# Patient Record
Sex: Male | Born: 1960 | Race: White | Hispanic: No | Marital: Single | Smoking: Current some day smoker
Health system: Southern US, Community
[De-identification: ages and names within clinical notes are randomized; demographics above are authoritative.]

## PROBLEM LIST (undated history)

## (undated) DIAGNOSIS — K922 Gastrointestinal hemorrhage, unspecified: Secondary | ICD-10-CM

## (undated) DIAGNOSIS — E119 Type 2 diabetes mellitus without complications: Secondary | ICD-10-CM

## (undated) DIAGNOSIS — C801 Malignant (primary) neoplasm, unspecified: Secondary | ICD-10-CM

## (undated) DIAGNOSIS — A159 Respiratory tuberculosis unspecified: Secondary | ICD-10-CM

## (undated) DIAGNOSIS — K7460 Unspecified cirrhosis of liver: Secondary | ICD-10-CM

## (undated) DIAGNOSIS — D689 Coagulation defect, unspecified: Secondary | ICD-10-CM

## (undated) DIAGNOSIS — F17200 Nicotine dependence, unspecified, uncomplicated: Secondary | ICD-10-CM

## (undated) DIAGNOSIS — F1010 Alcohol abuse, uncomplicated: Secondary | ICD-10-CM

## (undated) DIAGNOSIS — K7290 Hepatic failure, unspecified without coma: Secondary | ICD-10-CM

## (undated) DIAGNOSIS — B1920 Unspecified viral hepatitis C without hepatic coma: Secondary | ICD-10-CM

## (undated) DIAGNOSIS — I8500 Esophageal varices without bleeding: Secondary | ICD-10-CM

## (undated) DIAGNOSIS — K7682 Hepatic encephalopathy: Secondary | ICD-10-CM

## (undated) DIAGNOSIS — D696 Thrombocytopenia, unspecified: Secondary | ICD-10-CM

## (undated) DIAGNOSIS — F209 Schizophrenia, unspecified: Secondary | ICD-10-CM

## (undated) HISTORY — DX: Nicotine dependence, unspecified, uncomplicated: F17.200

## (undated) HISTORY — PX: OTHER SURGICAL HISTORY: SHX169

## (undated) HISTORY — DX: Unspecified viral hepatitis C without hepatic coma: B19.20

## (undated) HISTORY — DX: Type 2 diabetes mellitus without complications: E11.9

## (undated) HISTORY — DX: Coagulation defect, unspecified: D68.9

## (undated) HISTORY — DX: Alcohol abuse, uncomplicated: F10.10

---

## 1998-02-16 ENCOUNTER — Encounter: Admission: RE | Admit: 1998-02-16 | Discharge: 1998-02-16 | Payer: Self-pay | Admitting: Family Medicine

## 2002-08-19 ENCOUNTER — Inpatient Hospital Stay (HOSPITAL_COMMUNITY): Admission: EM | Admit: 2002-08-19 | Discharge: 2002-09-02 | Payer: Self-pay | Admitting: Psychiatry

## 2004-03-12 ENCOUNTER — Encounter: Admission: RE | Admit: 2004-03-12 | Discharge: 2004-03-12 | Payer: Self-pay | Admitting: Orthopedic Surgery

## 2004-10-19 ENCOUNTER — Ambulatory Visit: Payer: Self-pay | Admitting: Gastroenterology

## 2005-01-17 ENCOUNTER — Ambulatory Visit: Payer: Self-pay | Admitting: Internal Medicine

## 2005-04-05 ENCOUNTER — Ambulatory Visit: Payer: Self-pay | Admitting: Internal Medicine

## 2006-02-10 ENCOUNTER — Ambulatory Visit: Payer: Self-pay | Admitting: Gastroenterology

## 2007-02-06 ENCOUNTER — Ambulatory Visit: Payer: Self-pay | Admitting: Gastroenterology

## 2008-12-01 ENCOUNTER — Emergency Department (HOSPITAL_COMMUNITY): Admission: EM | Admit: 2008-12-01 | Discharge: 2008-12-01 | Payer: Self-pay | Admitting: Emergency Medicine

## 2011-03-18 NOTE — Discharge Summary (Signed)
NAME:  Oscar Swanson, Oscar Swanson                        ACCOUNT NO.:  [id number]   MEDICAL RECORD NO.:  [id number]                   PATIENT TYPE:  IPS   LOCATION:  0401                                 FACILITY:  [facility]   PHYSICIAN:  Geoffery Lyons, M.D.                   DATE OF BIRTH:  [birth year]   DATE OF ADMISSION:  08/19/2002  DATE OF DISCHARGE:  09/02/2002                                 DISCHARGE SUMMARY   CHIEF COMPLAINT AND PRESENT ILLNESS:  This was the first or second admission  to [facility] for this 50 year old single white  male involuntarily committed.  He reported that involuntary commitment said  that he had been noncompliant with medications.  He was paranoid,  threatening his landlord, wanting to blow up his apartment complex,  delusional, believed that he owned several businesses and they were trying  to ruin them.  He was not sleeping well.  He was not eating well.  He  reported positive auditory hallucinations but he would not elaborate.  He  also admitted that he had some alcohol use.   PAST PSYCHIATRIC HISTORY:  He was a very poor historian.  He has had follow  up through the mental health center.  This might be the first time at  [facility] but it was not clear if he had been admitted to  other institutions.   SUBSTANCE ABUSE HISTORY:  He claimed he drinks some and does some drug use  but would not elaborate. Denied the use or abuse of any substances.   PAST MEDICAL HISTORY:  Hepatitis C.   MEDICATIONS:  1. Seroquel.  2. Vistaril.  3. Tegretol.   None presently.   PHYSICAL EXAMINATION:  Physical examination was performed, failed to show  any acute findings.   MENTAL STATUS EXAM:  Mental status exam revealed an alert, passively  cooperative, disheveled, suspicious, guarded male wearing sunglasses.  Speech was clear.  Mood: He claimed he was fine.  His affect was blunted.  Thought processes: Evidence of some delusional  ideas, suspiciousness,  paranoia, grandiosity, some positive auditory hallucinations, no visual  hallucinations, denied any suicidal or homicidal ideas.  Cognitive:  Cognition was affected by the primary psychotic process.   ADMISSION DIAGNOSES:   AXIS I:  Schizophrenia, paranoid type.   AXIS II:  No diagnosis.   AXIS III:  Hepatitis C.   AXIS IV:  Moderate.   AXIS V:  Global assessment of functioning upon admission 20-25, highest  global assessment of functioning in the last year 50-55.   LABORATORY DATA:  Initial laboratory workup: Liver enzymes: SGOT 62, SGPT  113.  TSH 6.37.  Urine drug screening was negative for substances of abuse.  Hepatitis panel: Hepatitis C antibody was positive.   HOSPITAL COURSE:  He was admitted and started in intensive individual and  group psychotherapy.  He was given Ambien as needed  for sleep, Vistaril as  needed for anxiety, also Ativan.  He was given Risperdal 0.25 mg in the  morning and in the afternoon and Risperdal 0.5 mg at bedtime.  Risperdal was  increased to 0.5 mg twice a day and 1 mg at night.  He was given some  Librium as needed.  He continued to evidence active delusional ideas so the  Risperdal continued to be increased up to 1 mg twice a day and 3 mg at  bedtime.  He was very suspicious of staff, minimized his symptoms, could not  explain what happened, claimed he did not have a place to go to stay.  He  felt that he needed to press charges with other people who were trying take  advantage of him and keep his possessions.  We started working on his  reality testing as well as encouraging his compliance with treatment with  medications once discharged.  He continued to minimize and was very guarded.  He did endorse that he could read minds, that was why he was avoiding  people.  He also felt that the social worker could read his mind so he was  very uncomfortable with her.  We tried discharge but the delusional ideas  were so  overwhelming that there was concern about his safety, so discharge  was cancelled.  We continued to optimize treatment with medications.  He was  tolerating medications well.  By November 3, he was reportedly at baseline,  in much improved, compliant with medications, no active symptomatology, no  active delusional ideas, no evidence of active hallucinations.  Mental  health was going to be working closely with him to ensure that he would  comply with outpatient treatment.  So, as there were no suicidal ideas, no  homicidal ideas, he was much improved, we went ahead and discharged to  outpatient followup.   DISCHARGE DIAGNOSES:   AXIS I:  Schizophrenia, chronic, paranoid type.   AXIS II:  No diagnosis.   AXIS III:  Hepatitis C.   AXIS IV:  Moderate.   AXIS V:  Global assessment of functioning upon discharge 50.   DISCHARGE MEDICATIONS:  1. Ativan 0.5 mg four times a day.  2. Risperdal 1 mg twice a day and 4 mg at bedtime.  3. Hydrochlorothiazide 25 mg daily.  4. Flexeril 10 mg three a day for a week.   FOLLOW UP:  He was to follow up at [facility].                                                Geoffery Lyons, M.D.   IL/MEDQ  D:  10/01/2002  T:  10/01/2002  Job:  [id number]

## 2011-03-18 NOTE — H&P (Signed)
NAME:  Oscar Swanson, Oscar Swanson                        ACCOUNT NO.:  [id number]   MEDICAL RECORD NO.:  [id number]                   PATIENT TYPE:  IPS   LOCATION:  0401                                 FACILITY:  [facility]   PHYSICIAN:  Geoffery Lyons, M.D.                   DATE OF BIRTH:  [birth year]   DATE OF ADMISSION:  08/19/2002  DATE OF DISCHARGE:                         PSYCHIATRIC ADMISSION ASSESSMENT   IDENTIFYING INFORMATION:  A 50 year old single white male, involuntarily  committed on August 19, 2002.   HISTORY OF PRESENT ILLNESS:  The patient presents with a history of  commitment.  Papers report that the patient is noncompliant with  medications.  Papers state that the patient is paranoid, threatening his  landlord and wanting to blow up his apartment complex.  He is delusional.  He believes he owns several businesses and they are trying to run them.  Other information is uncertain if the patient has been sleeping or eating  well.  The patient reports positive auditory hallucinations but does not  want to elaborate on what they are about.  He reports he has had some  alcohol and drug use.   PAST PSYCHIATRIC HISTORY:  Uncertain inpatient admissions.  This appears to  be his first admission at [facility].   SOCIAL HISTORY:  He is a 50 year old single white male, no children.  He  lives alone.  He is unemployed.   FAMILY HISTORY:  Unclear.   ALCOHOL DRUG HISTORY:  The patient states he drinks some and does some drug  use.   PAST MEDICAL HISTORY:  Primary care Aniqa Hare is unknown.  Medical problems  are hepatitis C.   MEDICATIONS:  The patient has been on Seroquel, Vistaril and Tegretol in the  past.   DRUG ALLERGIES:  HALDOL.   PHYSICAL EXAMINATION:  VITAL SIGNS:  He is 5 feet 7 inches tall, he is 221  pounds, 97, 71, 18, blood pressure is 118/83.  GENERAL APPEARANCE:  The patient  is a 50 year old white male in no acute  distress.  He is well-developed,  average stature.  Appears his stated age.  He is unkempt with some body odor present.  HEAD:  Normocephalic, atraumatic.  Can raise his eyebrows.  EOMs are intact  bilaterally.  External ear canals are patent.  Hearing is appropriate to  conversation.  No sinus tenderness.  Mucosa is moise with poor dentition.  No lesions were seen.  Tongue protrudes midline without tremor.  Can clench  his teeth and puff out his cheeks.  Uvula is midline.  Pharynx is within  normal limits.  NECK:  Supple, no JVD, negative lymphadenopathy.  Trachea is midline.  No  cervical lymphadenopathy.  CHEST:  Clear to auscultation.  No adventitious sounds.  Thorax is  symmetrical with good expansion.  HEART:  Regular rate and rhythm, without murmurs, gallops or rubs.  Carotid  pulses are equal and  adequate.  No edema was noted.  ABDOMEN:  Obese, protuberant, nontender abdomen.  Active bowel sounds are  present.  No CVA tenderness.  MUSCULOSKELETAL:  No joint swelling or deformity.  Muscle strength and tone  is equal bilaterally.  SKIN:  Warm and dry with good turgor.  Nail beds are pink with good  capillary refill.  No rashes or lacerations were noted.  NEUROLOGIC:  Oriented x3.  Cranial nerves are grossly intact.  Cerebellar  function intact.   LABORATORY DATA:  CBC:  Hematocrit is 17.3.  CMET:  Liver enzymes:  SGOT is  elevated at 62, SGPT is 113.  Total protein is 8.5.  TSH is 6.37.  Urine  drug screen is negative.  Hepatitis panel:  Hepatitis C antibody is  positive.   MENTAL STATUS EXAM:  He is alert, he is passively cooperative, he is  disheveled, suspicious, guarded, wearing sunglasses.  Speech is clear.  Mood:  The patient states he is fine.  His affect is blunted.  Thought  processes are paranoid, delusional, and positive auditory hallucinations, no  visual hallucinations, no suicidal or homicidal ideation.  Cognitive:  The  patient is unsure of the date.  His memory is fair, judgment and insight  are  poor.   ADMISSION DIAGNOSES:   AXIS I:  Paranoid schizophrenia.   AXIS II:  Deferred.   AXIS III:  Hepatitis C.   AXIS IV:  Problems with primary support group, lack of support, problems  related to social environment, other psychosocial problems.   AXIS V:  Current is 25, this past year 55-60.   PLAN:  Involuntary commitment to [facility] for paranoid  behaviors, threatening behavior.  Contract for safety, check every 15  minutes.  We will put the patient on 400 hall for close monitoring.  We will  check his labs.  Will initiate Risperdal for psychosis and Ativan for  anxiety.  We will encourage group activities.  Case worker will work on  placement issues for the patient.  Patient to be medication compliant, to  follow up with mental health.   TENTATIVE LENGTH OF CARE:  4-5 days or more depending on response to  medication and placement issues.       Landry Corporal, N.P.                       Geoffery Lyons, M.D.    JO/MEDQ  D:  08/21/2002  T:  08/21/2002  Job:  [id number]

## 2011-08-24 ENCOUNTER — Ambulatory Visit (INDEPENDENT_AMBULATORY_CARE_PROVIDER_SITE_OTHER): Payer: Medicaid Other

## 2011-08-24 DIAGNOSIS — L989 Disorder of the skin and subcutaneous tissue, unspecified: Secondary | ICD-10-CM

## 2013-09-23 ENCOUNTER — Ambulatory Visit: Payer: Medicaid Other | Attending: Internal Medicine | Admitting: Internal Medicine

## 2013-09-23 ENCOUNTER — Encounter: Payer: Self-pay | Admitting: Internal Medicine

## 2013-09-23 VITALS — BP 131/89 | HR 72 | Temp 98.0°F | Resp 16 | Ht 69.0 in | Wt 202.4 lb

## 2013-09-23 DIAGNOSIS — IMO0001 Reserved for inherently not codable concepts without codable children: Secondary | ICD-10-CM

## 2013-09-23 DIAGNOSIS — E119 Type 2 diabetes mellitus without complications: Secondary | ICD-10-CM | POA: Insufficient documentation

## 2013-09-23 DIAGNOSIS — F17200 Nicotine dependence, unspecified, uncomplicated: Secondary | ICD-10-CM | POA: Insufficient documentation

## 2013-09-23 DIAGNOSIS — Z23 Encounter for immunization: Secondary | ICD-10-CM

## 2013-09-23 DIAGNOSIS — Z139 Encounter for screening, unspecified: Secondary | ICD-10-CM

## 2013-09-23 DIAGNOSIS — M549 Dorsalgia, unspecified: Secondary | ICD-10-CM | POA: Insufficient documentation

## 2013-09-23 HISTORY — DX: Reserved for inherently not codable concepts without codable children: IMO0001

## 2013-09-23 HISTORY — DX: Nicotine dependence, unspecified, uncomplicated: F17.200

## 2013-09-23 MED ORDER — NAPROXEN 500 MG PO TABS: 500.0000 mg | ORAL_TABLET | Freq: Two times a day (BID) | ORAL | Status: DC

## 2013-09-23 MED ORDER — NICOTINE 21 MG/24HR TD PT24: 21.0000 mg | MEDICATED_PATCH | Freq: Every day | TRANSDERMAL | Status: DC

## 2013-09-23 NOTE — Progress Notes (Signed)
Patient ID: CHEN SAADEH, male   DOB: 09-18-1961, 52 y.o.   MRN: [id number] MRN: [id number] Name: Oscar Swanson  Sex: male Age: 52 y.o. DOB: [birth year]  Allergies: Review of patient's allergies indicates no known allergies.  Chief Complaint  Patient presents with   Back Pain   Diabetes    HPI: Patient is 52 y.o. male who   52 year old male comes with a stent establish medical care, as per patient several years ago he was diagnosed with diabetes and was taking glipizide, for the last one year he has not been taking any medication, patient also reported to have some back pain which is chronic, requesting something medication, denies any shooting pain down to the legs or any incontinence, patient also smokes cigarettes everyday advised to quit smoking, is agreeable to try nicotine patch, as per patient he used to see ophthalmologist, he is advised to make a followup appointment with his eye doctor. Patient denies any headache dizziness chest shortness of breath.  Past Medical History  Diagnosis Date   Diabetes mellitus without complication    Diabetes     Past Surgical History  Procedure Laterality Date   Cataract surgery         Medication List       This list is accurate as of: 09/23/13  3:25 PM.  Always use your most recent med list.               GLIPIZIDE PO  Take by mouth.     naproxen 500 MG tablet  Commonly known as:  NAPROSYN  Take 1 tablet (500 mg total) by mouth 2 (two) times daily with a meal.     nicotine 21 mg/24hr patch  Commonly known as:  NICODERM CQ  Place 1 patch (21 mg total) onto the skin daily.        Meds ordered this encounter  Medications   GLIPIZIDE PO    Sig: Take by mouth.   naproxen (NAPROSYN) 500 MG tablet    Sig: Take 1 tablet (500 mg total) by mouth 2 (two) times daily with a meal.    Dispense:  30 tablet    Refill:  2   nicotine (NICODERM CQ) 21 mg/24hr patch    Sig: Place 1 patch (21 mg total) onto the skin  daily.    Dispense:  28 patch    Refill:  0    Immunization History  Administered Date(s) Administered   Influenza,inj,Quad PF,36+ Mos 09/23/2013    History  Substance Use Topics   Smoking status: Heavy Tobacco Smoker -- 0.50 packs/day for 25 years    Types: Cigarettes   Smokeless tobacco: Not on file   Alcohol Use: Not on file     Comment:  drink beer socially     Review of Systems  As noted in HPI  Filed Vitals:   09/23/13 1423  BP: 131/89  Pulse: 72  Temp: 98 F (36.7 C)  Resp: 16    Physical Exam  Physical Exam  Constitutional: No distress.  Eyes: EOM are normal. Pupils are equal, round, and reactive to light.  Cardiovascular: Normal rate and regular rhythm.   Pulmonary/Chest: Breath sounds normal. No respiratory distress. He has no wheezes. He has no rales.  Neurological:  Minimal lower lumbar spinal tenderness, SLR negative, equal strength both lower extremities    CBC No results found for this basename: wbc, rbc, hgb, hct, plt, mcv, neutrabs, lymphsabs, monoabs, eosabs, basosabs    CMP  No results found for this basename: na, k, cl, co2, glucose, bun, creatinine, calcium, prot, albumin, ast, alt, alkphos, bilitot, gfrnonaa, gfraa    No results found for this basename: chol, tri, ldl    No components found with this basename: hga1c    No results found for this basename: AST    Assessment and Plan   Diabetes - patient has history of diabetes.   Plan: Glucose (CBG) today 146, HgB A1c 4.5% patient has not been on any oral hypoglycemics for the last one year, will recheck hemoglobin A1c in 3 months  Smoking - Plan: nicotine (NICODERM CQ) 21 mg/24hr patch  Back pain - Plan: naproxen (NAPROSYN) 500 MG tablet  Need for prophylactic vaccination and inoculation against influenza  Screening - Plan: CBC with Differential, COMPLETE METABOLIC PANEL WITH GFR, TSH, Lipid panel, Vit D  25 hydroxy (rtn osteoporosis monitoring), Ambulatory referral to  Gastroenterology   Health Maintenance -Colonoscopy: Referral done   Return in about 4 weeks (around 10/21/2013).  Doris Cheadle, MD

## 2013-09-23 NOTE — Progress Notes (Signed)
Patient here to establish care History of DM Lower back pain- would like pain medication

## 2013-09-24 ENCOUNTER — Ambulatory Visit: Payer: Medicaid Other | Admitting: Internal Medicine

## 2013-10-29 ENCOUNTER — Ambulatory Visit: Payer: Medicaid Other

## 2013-11-07 ENCOUNTER — Emergency Department (HOSPITAL_COMMUNITY): Payer: Medicaid Other

## 2013-11-07 ENCOUNTER — Inpatient Hospital Stay (HOSPITAL_COMMUNITY)
Admission: EM | Admit: 2013-11-07 | Discharge: 2013-11-15 | DRG: 432 | Discharge status: Against Medical Advice | Payer: Medicaid Other | Attending: Internal Medicine | Admitting: Internal Medicine

## 2013-11-07 ENCOUNTER — Encounter (HOSPITAL_COMMUNITY): Payer: Self-pay | Admitting: Emergency Medicine

## 2013-11-07 DIAGNOSIS — E119 Type 2 diabetes mellitus without complications: Secondary | ICD-10-CM | POA: Diagnosis present

## 2013-11-07 DIAGNOSIS — R000 Tachycardia, unspecified: Secondary | ICD-10-CM | POA: Diagnosis present

## 2013-11-07 DIAGNOSIS — IMO0001 Reserved for inherently not codable concepts without codable children: Secondary | ICD-10-CM | POA: Diagnosis present

## 2013-11-07 DIAGNOSIS — I8510 Secondary esophageal varices without bleeding: Secondary | ICD-10-CM | POA: Diagnosis present

## 2013-11-07 DIAGNOSIS — K7010 Alcoholic hepatitis without ascites: Secondary | ICD-10-CM | POA: Diagnosis present

## 2013-11-07 DIAGNOSIS — I498 Other specified cardiac arrhythmias: Secondary | ICD-10-CM | POA: Diagnosis present

## 2013-11-07 DIAGNOSIS — F209 Schizophrenia, unspecified: Secondary | ICD-10-CM | POA: Diagnosis present

## 2013-11-07 DIAGNOSIS — K922 Gastrointestinal hemorrhage, unspecified: Secondary | ICD-10-CM | POA: Diagnosis present

## 2013-11-07 DIAGNOSIS — D62 Acute posthemorrhagic anemia: Secondary | ICD-10-CM | POA: Diagnosis present

## 2013-11-07 DIAGNOSIS — D696 Thrombocytopenia, unspecified: Secondary | ICD-10-CM | POA: Diagnosis present

## 2013-11-07 DIAGNOSIS — IMO0002 Reserved for concepts with insufficient information to code with codable children: Secondary | ICD-10-CM | POA: Diagnosis present

## 2013-11-07 DIAGNOSIS — D689 Coagulation defect, unspecified: Secondary | ICD-10-CM | POA: Diagnosis present

## 2013-11-07 DIAGNOSIS — B1921 Unspecified viral hepatitis C with hepatic coma: Secondary | ICD-10-CM | POA: Diagnosis present

## 2013-11-07 DIAGNOSIS — E876 Hypokalemia: Secondary | ICD-10-CM | POA: Diagnosis present

## 2013-11-07 DIAGNOSIS — D61818 Other pancytopenia: Secondary | ICD-10-CM | POA: Diagnosis present

## 2013-11-07 DIAGNOSIS — F1021 Alcohol dependence, in remission: Secondary | ICD-10-CM

## 2013-11-07 DIAGNOSIS — F200 Paranoid schizophrenia: Secondary | ICD-10-CM | POA: Diagnosis present

## 2013-11-07 DIAGNOSIS — Z79899 Other long term (current) drug therapy: Secondary | ICD-10-CM

## 2013-11-07 DIAGNOSIS — F1020 Alcohol dependence, uncomplicated: Secondary | ICD-10-CM | POA: Diagnosis present

## 2013-11-07 DIAGNOSIS — K766 Portal hypertension: Secondary | ICD-10-CM | POA: Diagnosis present

## 2013-11-07 DIAGNOSIS — E870 Hyperosmolality and hypernatremia: Secondary | ICD-10-CM | POA: Diagnosis not present

## 2013-11-07 DIAGNOSIS — R188 Other ascites: Secondary | ICD-10-CM | POA: Diagnosis present

## 2013-11-07 DIAGNOSIS — K254 Chronic or unspecified gastric ulcer with hemorrhage: Secondary | ICD-10-CM | POA: Diagnosis present

## 2013-11-07 DIAGNOSIS — E860 Dehydration: Secondary | ICD-10-CM | POA: Diagnosis present

## 2013-11-07 DIAGNOSIS — F1410 Cocaine abuse, uncomplicated: Secondary | ICD-10-CM | POA: Diagnosis present

## 2013-11-07 DIAGNOSIS — R578 Other shock: Secondary | ICD-10-CM | POA: Diagnosis present

## 2013-11-07 DIAGNOSIS — Z23 Encounter for immunization: Secondary | ICD-10-CM

## 2013-11-07 DIAGNOSIS — K7030 Alcoholic cirrhosis of liver without ascites: Principal | ICD-10-CM | POA: Diagnosis present

## 2013-11-07 DIAGNOSIS — Z8611 Personal history of tuberculosis: Secondary | ICD-10-CM

## 2013-11-07 DIAGNOSIS — K319 Disease of stomach and duodenum, unspecified: Secondary | ICD-10-CM | POA: Diagnosis present

## 2013-11-07 DIAGNOSIS — J9600 Acute respiratory failure, unspecified whether with hypoxia or hypercapnia: Secondary | ICD-10-CM | POA: Diagnosis not present

## 2013-11-07 DIAGNOSIS — K7290 Hepatic failure, unspecified without coma: Secondary | ICD-10-CM

## 2013-11-07 DIAGNOSIS — K7682 Hepatic encephalopathy: Secondary | ICD-10-CM | POA: Diagnosis present

## 2013-11-07 DIAGNOSIS — D72829 Elevated white blood cell count, unspecified: Secondary | ICD-10-CM | POA: Diagnosis present

## 2013-11-07 DIAGNOSIS — I8501 Esophageal varices with bleeding: Secondary | ICD-10-CM

## 2013-11-07 DIAGNOSIS — I8511 Secondary esophageal varices with bleeding: Secondary | ICD-10-CM | POA: Diagnosis present

## 2013-11-07 DIAGNOSIS — F17200 Nicotine dependence, unspecified, uncomplicated: Secondary | ICD-10-CM | POA: Diagnosis present

## 2013-11-07 DIAGNOSIS — Z87898 Personal history of other specified conditions: Secondary | ICD-10-CM

## 2013-11-07 DIAGNOSIS — J690 Pneumonitis due to inhalation of food and vomit: Secondary | ICD-10-CM | POA: Diagnosis not present

## 2013-11-07 DIAGNOSIS — Z781 Physical restraint status: Secondary | ICD-10-CM | POA: Diagnosis not present

## 2013-11-07 DIAGNOSIS — Z8249 Family history of ischemic heart disease and other diseases of the circulatory system: Secondary | ICD-10-CM

## 2013-11-07 HISTORY — DX: Schizophrenia, unspecified: F20.9

## 2013-11-07 HISTORY — DX: Respiratory tuberculosis unspecified: A15.9

## 2013-11-07 LAB — CBC WITH DIFFERENTIAL/PLATELET
BASOS ABS: 0 10*3/uL (ref 0.0–0.1)
Basophils Relative: 0 % (ref 0–1)
EOS ABS: 0 10*3/uL (ref 0.0–0.7)
Eosinophils Relative: 0 % (ref 0–5)
HCT: 31.9 % — ABNORMAL LOW (ref 39.0–52.0)
Hemoglobin: 10.9 g/dL — ABNORMAL LOW (ref 13.0–17.0)
LYMPHS ABS: 2.9 10*3/uL (ref 0.7–4.0)
LYMPHS PCT: 21 % (ref 12–46)
MCH: 33.6 pg (ref 26.0–34.0)
MCHC: 34.2 g/dL (ref 30.0–36.0)
MCV: 98.5 fL (ref 78.0–100.0)
Monocytes Absolute: 1.6 10*3/uL — ABNORMAL HIGH (ref 0.1–1.0)
Monocytes Relative: 12 % (ref 3–12)
NEUTROS PCT: 67 % (ref 43–77)
Neutro Abs: 9.1 10*3/uL — ABNORMAL HIGH (ref 1.7–7.7)
PLATELETS: 84 10*3/uL — AB (ref 150–400)
RBC: 3.24 MIL/uL — ABNORMAL LOW (ref 4.22–5.81)
RDW: 17 % — ABNORMAL HIGH (ref 11.5–15.5)
WBC: 13.6 10*3/uL — ABNORMAL HIGH (ref 4.0–10.5)

## 2013-11-07 LAB — PROTIME-INR
INR: 1.61 — AB (ref 0.00–1.49)
Prothrombin Time: 18.7 seconds — ABNORMAL HIGH (ref 11.6–15.2)

## 2013-11-07 LAB — COMPREHENSIVE METABOLIC PANEL
ALT: 78 U/L — ABNORMAL HIGH (ref 0–53)
AST: 86 U/L — AB (ref 0–37)
Albumin: 2.6 g/dL — ABNORMAL LOW (ref 3.5–5.2)
Alkaline Phosphatase: 120 U/L — ABNORMAL HIGH (ref 39–117)
BUN: 33 mg/dL — ABNORMAL HIGH (ref 6–23)
CALCIUM: 8.5 mg/dL (ref 8.4–10.5)
CO2: 23 meq/L (ref 19–32)
CREATININE: 0.96 mg/dL (ref 0.50–1.35)
Chloride: 104 mEq/L (ref 96–112)
GFR calc Af Amer: >90 mL/min (ref >90)
GFR calc non Af Amer: >90 mL/min (ref >90)
GLUCOSE: 127 mg/dL — AB (ref 70–99)
Potassium: 4.3 mEq/L (ref 3.7–5.3)
Sodium: 136 mEq/L — ABNORMAL LOW (ref 137–147)
Total Bilirubin: 1.3 mg/dL — ABNORMAL HIGH (ref 0.3–1.2)
Total Protein: 6.2 g/dL (ref 6.0–8.3)

## 2013-11-07 LAB — URINALYSIS, ROUTINE W REFLEX MICROSCOPIC
Bilirubin Urine: NEGATIVE
GLUCOSE, UA: NEGATIVE mg/dL
Hgb urine dipstick: NEGATIVE
Ketones, ur: NEGATIVE mg/dL
Leukocytes, UA: NEGATIVE
Nitrite: NEGATIVE
PH: 7 (ref 5.0–8.0)
PROTEIN: NEGATIVE mg/dL
SPECIFIC GRAVITY, URINE: 1.025 (ref 1.005–1.030)
Urobilinogen, UA: 0.2 mg/dL (ref 0.0–1.0)

## 2013-11-07 LAB — RAPID URINE DRUG SCREEN, HOSP PERFORMED
AMPHETAMINES: NOT DETECTED
BARBITURATES: NOT DETECTED
Benzodiazepines: NOT DETECTED
COCAINE: POSITIVE — AB
OPIATES: NOT DETECTED
TETRAHYDROCANNABINOL: NOT DETECTED

## 2013-11-07 LAB — OCCULT BLOOD, POC DEVICE: Fecal Occult Bld: POSITIVE — AB

## 2013-11-07 LAB — ACETAMINOPHEN LEVEL: Acetaminophen (Tylenol), Serum: <15 ug/mL (ref 10–30)

## 2013-11-07 LAB — GLUCOSE, CAPILLARY: Glucose-Capillary: 117 mg/dL — ABNORMAL HIGH (ref 70–99)

## 2013-11-07 LAB — CG4 I-STAT (LACTIC ACID): Lactic Acid, Venous: 2.58 mmol/L — ABNORMAL HIGH (ref 0.5–2.2)

## 2013-11-07 LAB — ETHANOL

## 2013-11-07 LAB — AMMONIA: Ammonia: 79 umol/L — ABNORMAL HIGH (ref 11–60)

## 2013-11-07 LAB — LIPASE, BLOOD: LIPASE: 41 U/L (ref 11–59)

## 2013-11-07 LAB — ABO/RH: ABO/RH(D): O POS

## 2013-11-07 MED ORDER — INSULIN ASPART 100 UNIT/ML ~~LOC~~ SOLN
0.0000 [IU] | SUBCUTANEOUS | Status: DC
  Administered 2013-11-08: 2 [IU] via SUBCUTANEOUS
  Administered 2013-11-08 – 2013-11-09 (×3): 1 [IU] via SUBCUTANEOUS
  Administered 2013-11-09 (×2): 2 [IU] via SUBCUTANEOUS
  Administered 2013-11-10: 1 [IU] via SUBCUTANEOUS

## 2013-11-07 MED ORDER — HALOPERIDOL LACTATE 5 MG/ML IJ SOLN
2.0000 mg | Freq: Once | INTRAMUSCULAR | Status: AC
  Administered 2013-11-07: 2 mg via INTRAVENOUS
  Filled 2013-11-07: qty 1

## 2013-11-07 MED ORDER — LORAZEPAM 2 MG/ML IJ SOLN
INTRAMUSCULAR | Status: AC
  Administered 2013-11-07: 2 mg via INTRAVENOUS
  Filled 2013-11-07: qty 1

## 2013-11-07 MED ORDER — VITAMIN K1 10 MG/ML IJ SOLN
5.0000 mg | Freq: Once | INTRAVENOUS | Status: AC
  Administered 2013-11-08: 5 mg via INTRAVENOUS
  Filled 2013-11-07: qty 0.5

## 2013-11-07 MED ORDER — PANTOPRAZOLE SODIUM 40 MG IV SOLR
40.0000 mg | Freq: Once | INTRAVENOUS | Status: AC
  Administered 2013-11-07: 40 mg via INTRAVENOUS
  Filled 2013-11-07: qty 40

## 2013-11-07 MED ORDER — ONDANSETRON HCL 4 MG/2ML IJ SOLN
4.0000 mg | Freq: Four times a day (QID) | INTRAMUSCULAR | Status: DC | PRN
Start: 2013-11-07 — End: 2013-11-15
  Filled 2013-11-07: qty 2

## 2013-11-07 MED ORDER — SODIUM CHLORIDE 0.9 % IV SOLN
8.0000 mg/h | INTRAVENOUS | Status: DC
  Administered 2013-11-07 – 2013-11-10 (×6): 8 mg/h via INTRAVENOUS
  Filled 2013-11-07 (×10): qty 80

## 2013-11-07 MED ORDER — DEXTROSE-NACL 5-0.9 % IV SOLN
INTRAVENOUS | Status: AC
  Administered 2013-11-08: via INTRAVENOUS

## 2013-11-07 MED ORDER — SODIUM CHLORIDE 0.9 % IV BOLUS (SEPSIS)
1000.0000 mL | Freq: Once | INTRAVENOUS | Status: AC
  Administered 2013-11-07: 1000 mL via INTRAVENOUS

## 2013-11-07 MED ORDER — SODIUM CHLORIDE 0.9 % IJ SOLN
3.0000 mL | Freq: Two times a day (BID) | INTRAMUSCULAR | Status: DC
  Administered 2013-11-08 – 2013-11-14 (×9): 3 mL via INTRAVENOUS

## 2013-11-07 MED ORDER — ONDANSETRON HCL 4 MG PO TABS: 4.0000 mg | ORAL_TABLET | Freq: Four times a day (QID) | ORAL | Status: DC | PRN

## 2013-11-07 NOTE — ED Notes (Addendum)
MD at bedside. 

## 2013-11-07 NOTE — H&P (Signed)
[facility] History and Physical  LEX LINHARES IRC:[id number] DOB: 1961-07-29    PCP:   Angelica Chessman, MD   Chief Complaint: confusion and hematemesis.  HPI: Oscar Swanson is an 53 y.o. male brought to the ER confused, agitated, and gave no history.  Per family, he had several episodes of hematemesis, and vomited a modeate amount of bright red blood with clots.  He also has melanotic stool as well.   He was very agitated and confused, requiring restraints.  He was given ativan and haldol IV with good effects.  It is not known how much alcohol he uses.  Evaluation in the ER showed tachycardia, with HR 120, BP 120/70, Hb of 10 grams per dL, and BUN of 33, with normal Cr.  His NH3 level was 75, and INR of 1.6.  Tylenol level is pending. UDS showed the presence of cocaine.  GI was consulted by the EDP, and hospitatist was asked to admit him for further treatment.  Rewiew of Systems:   Unable to obtain any meaningful ROS.   Past Medical History  Diagnosis Date  . Diabetes mellitus without complication   . Diabetes     Past Surgical History  Procedure Laterality Date  . Cataract surgery       Medications:  HOME MEDS: Prior to Admission medications   Medication Sig Start Date End Date Taking? Authorizing Provider  GLIPIZIDE PO Take by mouth.    Historical Provider, MD  naproxen (NAPROSYN) 500 MG tablet Take 1 tablet (500 mg total) by mouth 2 (two) times daily with a meal. 09/23/13   Lorayne Marek, MD  nicotine (NICODERM CQ) 21 mg/24hr patch Place 1 patch (21 mg total) onto the skin daily. 09/23/13   Lorayne Marek, MD     Allergies:  No Known Allergies  Social History:   reports that he has been smoking Cigarettes.  He has a 12.5 pack-year smoking history. He does not have any smokeless tobacco history on file. He reports that he drinks alcohol. He reports that he does not use illicit drugs.  Family History: Family History  Problem Relation Age of Onset  .  Hypertension Mother      Physical Exam: Filed Vitals:   11/07/13 2000 11/07/13 2023 11/07/13 2028 11/07/13 2100  BP: 134/71  143/71 135/70  Pulse: 91  99 96  Temp:  99.5 F (37.5 C) 99.2 F (37.3 C)   TempSrc:  Rectal Rectal   Resp: 24  23 22   SpO2: 97%  100% 100%   Blood pressure 135/70, pulse 96, temperature 99.2 F (37.3 C), temperature source Rectal, resp. rate 22, SpO2 100.00%.  GEN:  Agitated, thrashing around, fighting, and confused.  But no shortness of breath. PSYCH:  does not appear anxious or depressed; affect is appropriate. HEENT: Mucous membranes pink and anicteric; PERRLA; EOM intact; no cervical lymphadenopathy nor thyromegaly or carotid bruit; no JVD; There were no stridor. Neck is very supple. Breasts:: Not examined CHEST WALL: No tenderness CHEST: Normal respiration, clear to auscultation bilaterally.  HEART: Regular rate and rhythm.  There are no murmur, rub, or gallops.   BACK: No kyphosis or scoliosis; no CVA tenderness ABDOMEN: soft and non-tender; no masses, no organomegaly, normal abdominal bowel sounds; no pannus; no intertriginous candida. There is no rebound and no distention.  No peripheral stigmata of chronic liver disease. Rectal Exam: Not done EXTREMITIES: No bone or joint deformity; age-appropriate arthropathy of the hands and knees; no edema; no ulcerations.  There is  no calf tenderness. Genitalia: not examined PULSES: 2+ and symmetric SKIN: Normal hydration no rash or ulceration CNS: moves all four extremities, and facial symmetry with grimaces. Labs on Admission:  Basic Metabolic Panel:  Recent Labs Lab 11/07/13 1927  NA 136*  K 4.3  CL 104  CO2 23  GLUCOSE 127*  BUN 33*  CREATININE 0.96  CALCIUM 8.5   Liver Function Tests:  Recent Labs Lab 11/07/13 1927  AST 86*  ALT 78*  ALKPHOS 120*  BILITOT 1.3*  PROT 6.2  ALBUMIN 2.6*    Recent Labs Lab 11/07/13 1927  LIPASE 41    Recent Labs Lab 11/07/13 1927  AMMONIA 79*    CBC:  Recent Labs Lab 11/07/13 1927  WBC 13.6*  NEUTROABS 9.1*  HGB 10.9*  HCT 31.9*  MCV 98.5  PLT 84*   Cardiac Enzymes: No results found for this basename: CKTOTAL, CKMB, CKMBINDEX, TROPONINI,  in the last 168 hours  CBG:  Recent Labs Lab 11/07/13 1852  GLUCAP 117*     Radiological Exams on Admission: Ct Head Wo Contrast  11/07/2013   CLINICAL DATA:  Vomiting blood with slurred speech and confusion.  EXAM: CT HEAD WITHOUT CONTRAST  TECHNIQUE: Contiguous axial images were obtained from the base of the skull through the vertex without intravenous contrast.  COMPARISON:  None.  FINDINGS: The patient's head is mildly tilted to the right.  There is no evidence of acute intracranial hemorrhage, mass lesion, brain edema or extra-axial fluid collection. The ventricles and subarachnoid spaces are appropriately sized for age. There is no CT evidence of acute cortical infarction.  The visualized paranasal sinuses, mastoid air cells and middle ears are clear. The calvarium is intact.  IMPRESSION: Unremarkable noncontrast head CT.   Electronically Signed   By: Camie Patience M.D.   On: 11/07/2013 19:46   Assessment/Plan Present on Admission:  . Hepatic encephalopathy . Upper GI bleed . Smoking . Diabetes . Schizophrenia . Acute upper GI bleed  PLAN:  I suspect his upper GI bleed is from PUD using NSAIDS, but can't be sure about esophageal varices.  Will admit him to the SDU, start IV protonix, type and screen, large bore IV, and monitor Hcts.  GI has been consulted, and make recommmendation of IV PPI, NPO, and supportive care.  He is also encephalopathic, with elevated NH3 level, and will need lactullose when he is able to take it orally.  Will hold off on placing an NGT for that.  For his DM, will give Dextrose, and use SSI.  He will need CIWA protocol with IV ativan.  Will admit him to SDU under [facility] service.  Thank you for asking me to participate in his care.  Other plans as per  orders.  Code Status: FULL Haskel Khan, MD. [facility] Pager [phone] 7pm to 7am.  11/07/2013, 9:55 PM

## 2013-11-07 NOTE — ED Provider Notes (Signed)
I saw and evaluated the patient, reviewed the resident's note and I agree with the findings and plan.  EKG Interpretation    Date/Time:  Thursday November 07 2013 19:47:27 EST Ventricular Rate:  89 PR Interval:  97 QRS Duration: 88 QT Interval:  395 QTC Calculation: 481 R Axis:   78 Text Interpretation:  Age not entered, assumed to be  53 years old for purpose of ECG interpretation Sinus rhythm Short PR interval Abnormal R-wave progression, early transition Minimal ST depression, inferior leads Borderline prolonged QT interval Confirmed by Zenia Resides  MD, Klye Besecker ([id number]) on 11/07/2013 8:01:55 PM           patient seen and examined. Appears to be encephalopathic at this time. Arousable to stimulation. Head CT interpreted by radiologist as negative. Labs are pending at this time and patient likely to require hospitalization. No focal neurological deficits noted. History is limited by patient's altered mental status   Leota Jacobsen, MD 11/07/13 2003

## 2013-11-07 NOTE — ED Notes (Signed)
MD at bedside. 

## 2013-11-07 NOTE — ED Notes (Addendum)
EMS called to PT home x2 by family members today because Pt vomited blood. EMS reported Pt speech slurred and confused about his birthday.

## 2013-11-07 NOTE — ED Notes (Signed)
Dr Le at bedside.  

## 2013-11-07 NOTE — ED Provider Notes (Signed)
CSN: [id number]     Arrival date & time 11/07/13  1839 History   First MD Initiated Contact with Patient 11/07/13 1846     Chief Complaint  Patient presents with  . Hematemesis    The history is provided by the EMS personnel and a relative.   Oscar Swanson is a 53 y/o male with history of schizophrenia and diabetes who presents via EMS secondary to concern for possible GI bleed. Per EMS, they were called out to the patient's house twice over the last several hours. They were initially called because family members saw the patient vomiting blood. During their first visit the patient declined transport but they were called out again when the patient's sister was able to convince him to come to the emergency room. Unable to obtain history or ROS from the patient secondary to altered mental status. The patient's sister states the patient has been having both bloody stools and hematemesis both of which began today.   Past Medical History  Diagnosis Date  . Diabetes mellitus without complication   . Schizophrenia     onset age 74.  in and out of psych facilities from age 46 to age 63.  s/p electroconvulsive therapy.  . TB (tuberculosis)     sister says this dx'd at age 45 and treated   Past Surgical History  Procedure Laterality Date  . Cataract surgery      Family History  Problem Relation Age of Onset  . Hypertension Mother    History  Substance Use Topics  . Smoking status: Heavy Tobacco Smoker -- 0.50 packs/day for 25 years    Types: Cigarettes  . Smokeless tobacco: Not on file  . Alcohol Use: Yes     Comment:  drink beer socially     Review of Systems  Unable to perform ROS: Mental status change    Allergies  Review of patient's allergies indicates no known allergies.  Home Medications   No current outpatient prescriptions on file. BP 94/44  Pulse 77  Temp(Src) 99.8 F (37.7 C) (Oral)  Resp 20  Ht 5' 8.9" (1.75 m)  Wt 197 lb 15.6 oz (89.8 kg)  BMI 29.32 kg/m2  SpO2  100% Physical Exam  Constitutional: He appears well-developed. He appears lethargic. He appears ill.  HENT:  Head: Normocephalic and atraumatic.  Mouth/Throat: No oropharyngeal exudate.  Dried blood on face. No evidence of epistaxis both anterior or posteriorly.   Eyes: Conjunctivae are normal. Pupils are equal, round, and reactive to light.  Neck: Normal range of motion. Neck supple.  No meningismus.   Cardiovascular: Normal rate and regular rhythm.  Exam reveals no gallop and no friction rub.   No murmur heard. Pulmonary/Chest: Effort normal and breath sounds normal.  Abdominal: Soft. He exhibits distension (mild). There is no tenderness.  No obvious fluid wave or ascites.   Genitourinary: Penis normal.  Musculoskeletal: Normal range of motion. He exhibits no edema and no tenderness.  Extremities are grossly atraumatic. No pain with ROM of all extremities.   Neurological: He has normal strength and normal reflexes. He appears lethargic. GCS eye subscore is 4. GCS verbal subscore is 4. GCS motor subscore is 6.  Appears encephalopathic. Is able to identify objects but falls asleep during short conversation. Strength is intact and symmetric bilaterally. No pathological reflexes. Unable to formally test sensation secondary to altered mentation.     ED Course  Procedures (including critical care time) Labs Review Labs Reviewed  CBC WITH DIFFERENTIAL -  Abnormal; Notable for the following:    WBC 13.6 (*)    RBC 3.24 (*)    Hemoglobin 10.9 (*)    HCT 31.9 (*)    RDW 17.0 (*)    Platelets 84 (*)    Neutro Abs 9.1 (*)    Monocytes Absolute 1.6 (*)    All other components within normal limits  COMPREHENSIVE METABOLIC PANEL - Abnormal; Notable for the following:    Sodium 136 (*)    Glucose, Bld 127 (*)    BUN 33 (*)    Albumin 2.6 (*)    AST 86 (*)    ALT 78 (*)    Alkaline Phosphatase 120 (*)    Total Bilirubin 1.3 (*)    All other components within normal limits  GLUCOSE,  CAPILLARY - Abnormal; Notable for the following:    Glucose-Capillary 117 (*)    All other components within normal limits  AMMONIA - Abnormal; Notable for the following:    Ammonia 79 (*)    All other components within normal limits  PROTIME-INR - Abnormal; Notable for the following:    Prothrombin Time 18.7 (*)    INR 1.61 (*)    All other components within normal limits  URINE RAPID DRUG SCREEN (HOSP PERFORMED) - Abnormal; Notable for the following:    Cocaine POSITIVE (*)    All other components within normal limits  CBC - Abnormal; Notable for the following:    WBC 17.9 (*)    RBC 2.74 (*)    Hemoglobin 9.4 (*)    HCT 26.9 (*)    MCH 34.3 (*)    RDW 17.1 (*)    Platelets 79 (*)    All other components within normal limits  CBC - Abnormal; Notable for the following:    WBC 17.4 (*)    RBC 2.44 (*)    Hemoglobin 8.2 (*)    HCT 24.1 (*)    RDW 17.0 (*)    Platelets 84 (*)    All other components within normal limits  CBC - Abnormal; Notable for the following:    WBC 20.9 (*)    RBC 2.38 (*)    Hemoglobin 8.2 (*)    HCT 23.4 (*)    MCH 34.5 (*)    RDW 17.2 (*)    Platelets 89 (*)    All other components within normal limits  GLUCOSE, CAPILLARY - Abnormal; Notable for the following:    Glucose-Capillary 113 (*)    All other components within normal limits  GLUCOSE, CAPILLARY - Abnormal; Notable for the following:    Glucose-Capillary 128 (*)    All other components within normal limits  GLUCOSE, CAPILLARY - Abnormal; Notable for the following:    Glucose-Capillary 140 (*)    All other components within normal limits  CBC - Abnormal; Notable for the following:    WBC 21.4 (*)    RBC 2.35 (*)    Hemoglobin 7.9 (*)    HCT 23.3 (*)    RDW 17.4 (*)    Platelets 102 (*)    All other components within normal limits  PROTIME-INR - Abnormal; Notable for the following:    Prothrombin Time 19.1 (*)    INR 1.66 (*)    All other components within normal limits  GLUCOSE,  CAPILLARY - Abnormal; Notable for the following:    Glucose-Capillary 165 (*)    All other components within normal limits  CG4 I-STAT (LACTIC ACID) - Abnormal; Notable for  the following:    Lactic Acid, Venous 2.58 (*)    All other components within normal limits  OCCULT BLOOD, POC DEVICE - Abnormal; Notable for the following:    Fecal Occult Bld POSITIVE (*)    All other components within normal limits  POCT I-STAT 3, BLOOD GAS (G3+) - Abnormal; Notable for the following:    pH, Arterial 7.505 (*)    pCO2 arterial 32.5 (*)    pO2, Arterial 497.0 (*)    Bicarbonate 25.7 (*)    Acid-Base Excess 3.0 (*)    All other components within normal limits  MRSA PCR SCREENING  URINE CULTURE  CULTURE, RESPIRATORY (NON-EXPECTORATED)  LIPASE, BLOOD  ETHANOL  URINALYSIS, ROUTINE W REFLEX MICROSCOPIC  ACETAMINOPHEN LEVEL  APTT  CBC  CBC  HEPATITIS PANEL, ACUTE  BLOOD GAS, ARTERIAL  BLOOD GAS, ARTERIAL  TYPE AND SCREEN  ABO/RH  PREPARE FRESH FROZEN PLASMA  PREPARE RBC (CROSSMATCH)  PREPARE FRESH FROZEN PLASMA   Imaging Review Ct Head Wo Contrast  11/07/2013   CLINICAL DATA:  Vomiting blood with slurred speech and confusion.  EXAM: CT HEAD WITHOUT CONTRAST  TECHNIQUE: Contiguous axial images were obtained from the base of the skull through the vertex without intravenous contrast.  COMPARISON:  None.  FINDINGS: The patient's head is mildly tilted to the right.  There is no evidence of acute intracranial hemorrhage, mass lesion, brain edema or extra-axial fluid collection. The ventricles and subarachnoid spaces are appropriately sized for age. There is no CT evidence of acute cortical infarction.  The visualized paranasal sinuses, mastoid air cells and middle ears are clear. The calvarium is intact.  IMPRESSION: Unremarkable noncontrast head CT.   Electronically Signed   By: Camie Patience M.D.   On: 11/07/2013 19:46   Dg Chest Port 1 View  11/08/2013   CLINICAL DATA:  Evaluate endotracheal tube and  central line  EXAM: PORTABLE CHEST - 1 VIEW  COMPARISON:  November 08, 2013 08/1950 a.m.  FINDINGS: Endotracheal tube is noted 6.9 cm from carina. Advancement by 3 cm is recommended. There is no pneumothorax. Right subclavian central venous line is noted with distal tip in the superior vena cava. A nasogastric tube is identified with distal tip not included on film but is at least in the mid stomach. There is diffuse increased pulmonary interstitium bilaterally. There is no pleural effusion. The heart size is normal.  IMPRESSION: Life supporting devices as described. Advancement of the endotracheal 2 x 3 cm is recommended. There is no pneumothorax. Mild diffuse interstitial edema.   Electronically Signed   By: Abelardo Diesel M.D.   On: 11/08/2013 14:30   Dg Chest Port 1 View  11/08/2013   CLINICAL DATA:  Respiratory failure, intubation  EXAM: PORTABLE CHEST - 1 VIEW  COMPARISON:  Portable exam 1151 hr without priors for comparison  FINDINGS: Tip of endotracheal tube projects 3.5 above carina.  Normal heart size, mediastinal contours and pulmonary vascularity.  Accentuation of interstitial markings in the mid to lower lungs, uncertain acuity.  No segmental consolidation, pleural effusion or pneumothorax.  Question left nipple shadow.  No acute osseous findings.  IMPRESSION: Satisfactory endotracheal tube position.  Minimal accentuation of interstitial markings, of uncertain acuity.  Questionable left nipple shadow; recommend followup upright PA chest radiograph with nipple markers when the patient's condition permits for further evaluation.   Electronically Signed   By: Lavonia Dana M.D.   On: 11/08/2013 13:08    EKG Interpretation    Date/Time:  Thursday November 07 2013 19:47:27 EST Ventricular Rate:  89 PR Interval:  97 QRS Duration: 88 QT Interval:  395 QTC Calculation: 481 R Axis:   78 Text Interpretation:  Age not entered, assumed to be  53 years old for purpose of ECG interpretation Sinus rhythm Short  PR interval Abnormal R-wave progression, early transition Minimal ST depression, inferior leads Borderline prolonged QT interval Confirmed by Zenia Resides  MD, ANTHONY ([id number]) on 11/07/2013 8:01:55 PM            MDM   1. Hepatic encephalopathy   2. Acute upper GI bleed   3. Diabetes   4. History of alcohol use   5. Leukocytosis   6. Schizophrenia   7. Thrombocytopenia   8. Acute respiratory failure   9. Cirrhosis with alcoholism   10. Coagulopathy    Oscar Swanson is a 53 y/o male with history of schizophrenia and diabetes who presents via EMS secondary to concern for AMS in setting of possible GI bleed. Afebrile. HDS on arrival. Appears encephalopathic on exam. No focality to Neuro exam and CT head was normal. Doubt ICH or CVA.  Abdominal exam is benign. Labs with hemoglobin of 10.9.  Ammonia slightly elevated at 79. INR elevated to 1.61. Concern for cirrhosis as there is no history of anticoagulant use. CMP with elevated LFT's, Alk phos and T.Bili. AMS is likely secondary to encephalopathy vs. ETOH withdrawal vs. decompensated schizophrenia as his sister states he has been of of his mediations. He had some episodes of grossly bloody hematemesis while in the ED and his stool was guaiac +. Concern for UGIB. He was given protonix IV and IVF and remained HDS while in the ED. Soft wrist restraints placed for his safety as he was intermittently confused and unable to follow instructions. Consulted medicine for admission for UGIB and encephalopathy. Spoke with GI on call (Dr. Henrene Pastor) who recommended supportive therapy and IV protonix. Recommended keeping NPO at this time and to contact them in the event he become unstable.     Donita Brooks, MD 11/08/13 [phone]

## 2013-11-07 NOTE — ED Notes (Signed)
Dr Allen at bedside  

## 2013-11-07 NOTE — ED Notes (Signed)
Pt actively trying to crawl out of bed. Pt unable to obey commands. Pt has incomprehensible speech and unable to tell this RN where he wants to go. Resident aware.

## 2013-11-08 ENCOUNTER — Encounter (HOSPITAL_COMMUNITY)
Admission: EM | Discharge status: Against Medical Advice | Payer: Self-pay | Source: Home / Self Care | Attending: Pulmonary Disease

## 2013-11-08 ENCOUNTER — Encounter (HOSPITAL_COMMUNITY): Payer: Self-pay | Admitting: Physician Assistant

## 2013-11-08 ENCOUNTER — Inpatient Hospital Stay (HOSPITAL_COMMUNITY): Payer: Medicaid Other | Admitting: Anesthesiology

## 2013-11-08 ENCOUNTER — Encounter (HOSPITAL_COMMUNITY): Payer: Medicaid Other | Admitting: Anesthesiology

## 2013-11-08 ENCOUNTER — Inpatient Hospital Stay (HOSPITAL_COMMUNITY): Payer: Medicaid Other

## 2013-11-08 DIAGNOSIS — K7682 Hepatic encephalopathy: Secondary | ICD-10-CM

## 2013-11-08 DIAGNOSIS — K7030 Alcoholic cirrhosis of liver without ascites: Secondary | ICD-10-CM | POA: Diagnosis present

## 2013-11-08 DIAGNOSIS — F1410 Cocaine abuse, uncomplicated: Secondary | ICD-10-CM | POA: Diagnosis present

## 2013-11-08 DIAGNOSIS — I8501 Esophageal varices with bleeding: Secondary | ICD-10-CM

## 2013-11-08 DIAGNOSIS — J9600 Acute respiratory failure, unspecified whether with hypoxia or hypercapnia: Secondary | ICD-10-CM | POA: Diagnosis not present

## 2013-11-08 DIAGNOSIS — R000 Tachycardia, unspecified: Secondary | ICD-10-CM | POA: Diagnosis present

## 2013-11-08 DIAGNOSIS — D72829 Elevated white blood cell count, unspecified: Secondary | ICD-10-CM | POA: Diagnosis present

## 2013-11-08 DIAGNOSIS — D689 Coagulation defect, unspecified: Secondary | ICD-10-CM | POA: Diagnosis present

## 2013-11-08 DIAGNOSIS — K7290 Hepatic failure, unspecified without coma: Secondary | ICD-10-CM

## 2013-11-08 DIAGNOSIS — E860 Dehydration: Secondary | ICD-10-CM | POA: Diagnosis present

## 2013-11-08 DIAGNOSIS — K922 Gastrointestinal hemorrhage, unspecified: Secondary | ICD-10-CM

## 2013-11-08 DIAGNOSIS — D696 Thrombocytopenia, unspecified: Secondary | ICD-10-CM | POA: Diagnosis present

## 2013-11-08 DIAGNOSIS — F209 Schizophrenia, unspecified: Secondary | ICD-10-CM

## 2013-11-08 HISTORY — DX: Coagulation defect, unspecified: D68.9

## 2013-11-08 HISTORY — PX: ESOPHAGOGASTRODUODENOSCOPY: SHX5428

## 2013-11-08 LAB — CBC
HCT: 23.3 % — ABNORMAL LOW (ref 39.0–52.0)
HCT: 24.1 % — ABNORMAL LOW (ref 39.0–52.0)
HCT: 25 % — ABNORMAL LOW (ref 39.0–52.0)
HCT: 26.9 % — ABNORMAL LOW (ref 39.0–52.0)
HCT: 27.4 % — ABNORMAL LOW (ref 39.0–52.0)
HEMATOCRIT: 23.4 % — AB (ref 39.0–52.0)
HEMOGLOBIN: 8.2 g/dL — AB (ref 13.0–17.0)
HEMOGLOBIN: 7.9 g/dL — AB (ref 13.0–17.0)
HEMOGLOBIN: 8.7 g/dL — AB (ref 13.0–17.0)
Hemoglobin: 8.2 g/dL — ABNORMAL LOW (ref 13.0–17.0)
Hemoglobin: 9.4 g/dL — ABNORMAL LOW (ref 13.0–17.0)
Hemoglobin: 9.4 g/dL — ABNORMAL LOW (ref 13.0–17.0)
MCH: 32.9 pg (ref 26.0–34.0)
MCH: 33.2 pg (ref 26.0–34.0)
MCH: 33.6 pg (ref 26.0–34.0)
MCH: 33.6 pg (ref 26.0–34.0)
MCH: 34.3 pg — ABNORMAL HIGH (ref 26.0–34.0)
MCH: 34.5 pg — ABNORMAL HIGH (ref 26.0–34.0)
MCHC: 33.9 g/dL (ref 30.0–36.0)
MCHC: 34 g/dL (ref 30.0–36.0)
MCHC: 34.3 g/dL (ref 30.0–36.0)
MCHC: 34.8 g/dL (ref 30.0–36.0)
MCHC: 34.9 g/dL (ref 30.0–36.0)
MCHC: 35 g/dL (ref 30.0–36.0)
MCV: 95.4 fL (ref 78.0–100.0)
MCV: 95.8 fL (ref 78.0–100.0)
MCV: 98.2 fL (ref 78.0–100.0)
MCV: 98.3 fL (ref 78.0–100.0)
MCV: 98.8 fL (ref 78.0–100.0)
MCV: 99.1 fL (ref 78.0–100.0)
PLATELETS: 53 10*3/uL — AB (ref 150–400)
PLATELETS: 79 10*3/uL — AB (ref 150–400)
PLATELETS: 89 10*3/uL — AB (ref 150–400)
Platelets: 84 10*3/uL — ABNORMAL LOW (ref 150–400)
Platelets: 102 10*3/uL — ABNORMAL LOW (ref 150–400)
Platelets: 62 10*3/uL — ABNORMAL LOW (ref 150–400)
RBC: 2.35 MIL/uL — ABNORMAL LOW (ref 4.22–5.81)
RBC: 2.38 MIL/uL — AB (ref 4.22–5.81)
RBC: 2.44 MIL/uL — AB (ref 4.22–5.81)
RBC: 2.62 MIL/uL — ABNORMAL LOW (ref 4.22–5.81)
RBC: 2.74 MIL/uL — ABNORMAL LOW (ref 4.22–5.81)
RBC: 2.86 MIL/uL — AB (ref 4.22–5.81)
RDW: 17 % — ABNORMAL HIGH (ref 11.5–15.5)
RDW: 17.2 % — ABNORMAL HIGH (ref 11.5–15.5)
RDW: 17.7 % — AB (ref 11.5–15.5)
RDW: 17.4 % — AB (ref 11.5–15.5)
RDW: 17.9 % — ABNORMAL HIGH (ref 11.5–15.5)
RDW: 17.1 % — AB (ref 11.5–15.5)
WBC: 17.4 10*3/uL — ABNORMAL HIGH (ref 4.0–10.5)
WBC: 17.5 10*3/uL — AB (ref 4.0–10.5)
WBC: 21.4 10*3/uL — ABNORMAL HIGH (ref 4.0–10.5)
WBC: 12.5 10*3/uL — ABNORMAL HIGH (ref 4.0–10.5)
WBC: 17.9 10*3/uL — AB (ref 4.0–10.5)
WBC: 20.9 10*3/uL — AB (ref 4.0–10.5)

## 2013-11-08 LAB — PREPARE RBC (CROSSMATCH)

## 2013-11-08 LAB — PREPARE FRESH FROZEN PLASMA
Unit division: 0
Unit division: 0

## 2013-11-08 LAB — GLUCOSE, CAPILLARY
GLUCOSE-CAPILLARY: 128 mg/dL — AB (ref 70–99)
GLUCOSE-CAPILLARY: 145 mg/dL — AB (ref 70–99)
GLUCOSE-CAPILLARY: 165 mg/dL — AB (ref 70–99)
Glucose-Capillary: 113 mg/dL — ABNORMAL HIGH (ref 70–99)
Glucose-Capillary: 113 mg/dL — ABNORMAL HIGH (ref 70–99)
Glucose-Capillary: 129 mg/dL — ABNORMAL HIGH (ref 70–99)
Glucose-Capillary: 140 mg/dL — ABNORMAL HIGH (ref 70–99)

## 2013-11-08 LAB — POCT I-STAT 3, ART BLOOD GAS (G3+)
ACID-BASE EXCESS: 3 mmol/L — AB (ref 0.0–2.0)
Bicarbonate: 25.7 mEq/L — ABNORMAL HIGH (ref 20.0–24.0)
O2 SAT: 100 %
PO2 ART: 497 mmHg — AB (ref 80.0–100.0)
TCO2: 27 mmol/L (ref 0–100)
pCO2 arterial: 32.5 mmHg — ABNORMAL LOW (ref 35.0–45.0)
pH, Arterial: 7.505 — ABNORMAL HIGH (ref 7.350–7.450)

## 2013-11-08 LAB — PROTIME-INR
INR: 1.66 — ABNORMAL HIGH (ref 0.00–1.49)
PROTHROMBIN TIME: 19.1 s — AB (ref 11.6–15.2)

## 2013-11-08 LAB — MRSA PCR SCREENING: MRSA by PCR: NEGATIVE

## 2013-11-08 LAB — APTT: APTT: 31 s (ref 24–37)

## 2013-11-08 SURGERY — CANCELLED PROCEDURE

## 2013-11-08 SURGERY — EGD (ESOPHAGOGASTRODUODENOSCOPY)
Anesthesia: Moderate Sedation

## 2013-11-08 MED ORDER — PNEUMOCOCCAL VAC POLYVALENT 25 MCG/0.5ML IJ INJ: 0.5000 mL | INJECTION | INTRAMUSCULAR | Status: DC | PRN

## 2013-11-08 MED ORDER — ADULT MULTIVITAMIN W/MINERALS CH
1.0000 | ORAL_TABLET | Freq: Every day | ORAL | Status: DC
Start: 2013-11-08 — End: 2013-11-08
  Filled 2013-11-08: qty 1

## 2013-11-08 MED ORDER — MIDAZOLAM HCL 2 MG/2ML IJ SOLN
INTRAMUSCULAR | Status: AC
  Filled 2013-11-08: qty 4

## 2013-11-08 MED ORDER — FENTANYL CITRATE 0.05 MG/ML IJ SOLN: 100.0000 ug | Freq: Once | INTRAMUSCULAR | Status: AC

## 2013-11-08 MED ORDER — ETOMIDATE 2 MG/ML IV SOLN
INTRAVENOUS | Status: AC
  Filled 2013-11-08: qty 20

## 2013-11-08 MED ORDER — MIDAZOLAM HCL 5 MG/ML IJ SOLN
INTRAMUSCULAR | Status: AC
  Filled 2013-11-08: qty 1

## 2013-11-08 MED ORDER — OCTREOTIDE LOAD VIA INFUSION
50.0000 ug | Freq: Once | INTRAVENOUS | Status: AC
Start: 2013-11-08 — End: 2013-11-08
  Administered 2013-11-08: 50 ug via INTRAVENOUS
  Filled 2013-11-08: qty 25

## 2013-11-08 MED ORDER — DEXMEDETOMIDINE HCL IN NACL 200 MCG/50ML IV SOLN
0.4000 ug/kg/h | INTRAVENOUS | Status: DC
  Administered 2013-11-08: 0.4 ug/kg/h via INTRAVENOUS
  Administered 2013-11-09 (×3): 0.6 ug/kg/h via INTRAVENOUS
  Filled 2013-11-08 (×5): qty 50

## 2013-11-08 MED ORDER — MIDAZOLAM HCL 2 MG/2ML IJ SOLN: 2.0000 mg | Freq: Once | INTRAMUSCULAR | Status: DC

## 2013-11-08 MED ORDER — SODIUM CHLORIDE 0.9 % IV SOLN: INTRAVENOUS | Status: DC

## 2013-11-08 MED ORDER — LORAZEPAM 2 MG/ML IJ SOLN
0.0000 mg | Freq: Four times a day (QID) | INTRAMUSCULAR | Status: DC
  Administered 2013-11-08: 2 mg via INTRAVENOUS
  Filled 2013-11-08: qty 1

## 2013-11-08 MED ORDER — THIAMINE HCL 100 MG/ML IJ SOLN
100.0000 mg | Freq: Every day | INTRAMUSCULAR | Status: DC
  Administered 2013-11-08 – 2013-11-13 (×6): 100 mg via INTRAVENOUS
  Filled 2013-11-08 (×6): qty 1

## 2013-11-08 MED ORDER — NOREPINEPHRINE BITARTRATE 1 MG/ML IJ SOLN
2.0000 ug/min | INTRAMUSCULAR | Status: DC
  Administered 2013-11-08: 2 ug/min via INTRAVENOUS
  Administered 2013-11-09: 14 ug/min via INTRAVENOUS
  Filled 2013-11-08 (×3): qty 4

## 2013-11-08 MED ORDER — SUCCINYLCHOLINE CHLORIDE 20 MG/ML IJ SOLN
INTRAMUSCULAR | Status: DC | PRN
  Administered 2013-11-08: 100 mg via INTRAVENOUS

## 2013-11-08 MED ORDER — LORAZEPAM 1 MG PO TABS: 1.0000 mg | ORAL_TABLET | Freq: Four times a day (QID) | ORAL | Status: DC | PRN

## 2013-11-08 MED ORDER — MIDAZOLAM HCL 2 MG/2ML IJ SOLN: 1.0000 mg | INTRAMUSCULAR | Status: DC | PRN

## 2013-11-08 MED ORDER — FENTANYL CITRATE 0.05 MG/ML IJ SOLN
INTRAMUSCULAR | Status: AC
  Filled 2013-11-08: qty 4

## 2013-11-08 MED ORDER — DIPHENHYDRAMINE HCL 50 MG/ML IJ SOLN
INTRAMUSCULAR | Status: AC
  Administered 2013-11-08: 50 mg
  Filled 2013-11-08: qty 1

## 2013-11-08 MED ORDER — FENTANYL CITRATE 0.05 MG/ML IJ SOLN
INTRAMUSCULAR | Status: AC
Start: 2013-11-08 — End: 2013-11-08
  Filled 2013-11-08: qty 2

## 2013-11-08 MED ORDER — ROCURONIUM BROMIDE 50 MG/5ML IV SOLN
INTRAVENOUS | Status: AC
  Filled 2013-11-08: qty 2

## 2013-11-08 MED ORDER — MIDAZOLAM HCL 2 MG/2ML IJ SOLN
4.0000 mg | Freq: Once | INTRAMUSCULAR | Status: AC
  Administered 2013-11-08: 4 mg via INTRAVENOUS

## 2013-11-08 MED ORDER — LORAZEPAM 2 MG/ML IJ SOLN: 0.0000 mg | Freq: Two times a day (BID) | INTRAMUSCULAR | Status: DC

## 2013-11-08 MED ORDER — CHLORHEXIDINE GLUCONATE 0.12 % MT SOLN
15.0000 mL | Freq: Two times a day (BID) | OROMUCOSAL | Status: DC
  Administered 2013-11-08 – 2013-11-11 (×7): 15 mL via OROMUCOSAL
  Filled 2013-11-08 (×7): qty 15

## 2013-11-08 MED ORDER — LORAZEPAM 2 MG/ML IJ SOLN: 1.0000 mg | Freq: Four times a day (QID) | INTRAMUSCULAR | Status: DC | PRN

## 2013-11-08 MED ORDER — SODIUM CHLORIDE 0.9 % IV BOLUS (SEPSIS)
500.0000 mL | Freq: Once | INTRAVENOUS | Status: AC
  Administered 2013-11-08: 500 mL via INTRAVENOUS
  Administered 2013-11-08: 21:00:00 via INTRAVENOUS

## 2013-11-08 MED ORDER — SODIUM CHLORIDE 0.9 % IV SOLN
3.0000 g | Freq: Four times a day (QID) | INTRAVENOUS | Status: DC
  Administered 2013-11-08 – 2013-11-12 (×16): 3 g via INTRAVENOUS
  Filled 2013-11-08 (×18): qty 3

## 2013-11-08 MED ORDER — SUCCINYLCHOLINE CHLORIDE 20 MG/ML IJ SOLN
INTRAMUSCULAR | Status: AC
  Filled 2013-11-08: qty 1

## 2013-11-08 MED ORDER — MORPHINE SULFATE 4 MG/ML IJ SOLN
INTRAMUSCULAR | Status: AC
  Filled 2013-11-08: qty 1

## 2013-11-08 MED ORDER — SODIUM CHLORIDE 0.9 % IV SOLN
INTRAVENOUS | Status: DC
  Administered 2013-11-08 – 2013-11-11 (×5): via INTRAVENOUS

## 2013-11-08 MED ORDER — METHYLPREDNISOLONE SODIUM SUCC 125 MG IJ SOLR
INTRAMUSCULAR | Status: AC
  Administered 2013-11-08: 125 mg
  Filled 2013-11-08: qty 2

## 2013-11-08 MED ORDER — INFLUENZA VAC SPLIT QUAD 0.5 ML IM SUSP: 0.5000 mL | INTRAMUSCULAR | Status: DC | PRN

## 2013-11-08 MED ORDER — VITAMIN B-1 100 MG PO TABS
100.0000 mg | ORAL_TABLET | Freq: Every day | ORAL | Status: DC
  Filled 2013-11-08: qty 1

## 2013-11-08 MED ORDER — FENTANYL CITRATE 0.05 MG/ML IJ SOLN
25.0000 ug | INTRAMUSCULAR | Status: DC | PRN
  Administered 2013-11-08 – 2013-11-09 (×2): 50 ug via INTRAVENOUS
  Administered 2013-11-09 (×2): 100 ug via INTRAVENOUS
  Filled 2013-11-08 (×3): qty 2

## 2013-11-08 MED ORDER — MIDAZOLAM HCL 2 MG/2ML IJ SOLN
INTRAMUSCULAR | Status: AC
  Administered 2013-11-08: 2 mg
  Filled 2013-11-08: qty 4

## 2013-11-08 MED ORDER — PROPOFOL 10 MG/ML IV BOLUS
INTRAVENOUS | Status: DC | PRN
  Administered 2013-11-08: 100 mg via INTRAVENOUS

## 2013-11-08 MED ORDER — FUROSEMIDE 10 MG/ML IJ SOLN: 80.0000 mg | Freq: Once | INTRAMUSCULAR | Status: DC

## 2013-11-08 MED ORDER — BIOTENE DRY MOUTH MT LIQD
15.0000 mL | Freq: Four times a day (QID) | OROMUCOSAL | Status: DC
  Administered 2013-11-08 – 2013-11-12 (×15): 15 mL via OROMUCOSAL

## 2013-11-08 MED ORDER — FUROSEMIDE 10 MG/ML IJ SOLN
INTRAMUSCULAR | Status: AC
  Filled 2013-11-08: qty 8

## 2013-11-08 MED ORDER — OCTREOTIDE ACETATE 500 MCG/ML IJ SOLN
50.0000 ug/h | INTRAMUSCULAR | Status: DC
  Administered 2013-11-08 – 2013-11-09 (×4): 50 ug/h via INTRAVENOUS
  Filled 2013-11-08 (×6): qty 1

## 2013-11-08 MED ORDER — SODIUM CHLORIDE 0.9 % IV BOLUS (SEPSIS)
500.0000 mL | Freq: Once | INTRAVENOUS | Status: AC
  Administered 2013-11-08: 500 mL via INTRAVENOUS

## 2013-11-08 MED ORDER — FOLIC ACID 1 MG PO TABS
1.0000 mg | ORAL_TABLET | Freq: Every day | ORAL | Status: DC
Start: 2013-11-08 — End: 2013-11-08
  Filled 2013-11-08: qty 1

## 2013-11-08 MED ORDER — ETOMIDATE 2 MG/ML IV SOLN
20.0000 mg | Freq: Once | INTRAVENOUS | Status: AC
  Administered 2013-11-08: 20 mg via INTRAVENOUS

## 2013-11-08 MED ORDER — LIDOCAINE HCL (CARDIAC) 20 MG/ML IV SOLN
INTRAVENOUS | Status: AC
Start: 2013-11-08 — End: 2013-11-09
  Filled 2013-11-08: qty 5

## 2013-11-08 NOTE — Procedures (Signed)
Intubation Procedure Note - ETT Exchange  Oscar Swanson [id number] [birth year]  Procedure: Intubation Indications: ETT needed to be changed  Procedure Details Consent: Unable to obtain consent because of emergent medical necessity. Time Out: Verified patient identification, verified procedure, site/side was marked, verified correct patient position, special equipment/implants available, medications/allergies/relevent history reviewed, required imaging and test results available.  Performed  Maximum sterile technique was used including antiseptics, cap, gloves, gown, hand hygiene and mask.  MAC and 4 Glide Scope The tube was exchanged over bouge    Evaluation Hemodynamic Status: BP stable throughout; O2 sats: stable throughout Patient's Current Condition: stable Complications: No apparent complications Patient did tolerate procedure well. Chest X-ray ordered to verify placement.  CXR: pending.  Procedure performed per Dr. Lexine Baton, NP-C [facility] Pgr: [phone] or [phone]    11/08/2013  Oscar Swanson

## 2013-11-08 NOTE — Progress Notes (Signed)
Pt had a leak in ET tube cuff. MD in to see pt and exchange out ET tube( see note) patient was given sedation. Low b/p Dr Elsworth Soho notified and nacl 500cc bolus given.

## 2013-11-08 NOTE — Progress Notes (Signed)
Rn called by Endo to come assess patient for change in breathing status; RN Paged NP Lissa Merlin to come to endo suite; Upon assessment pt noted to be using accessory muscle use to breath which was a change from previously; NP Lissa Merlin made decision to intubate pt; Anesthesia intubated pt; Pt transferred to [place] by RN, Rapid Response, RT and NP Lissa Merlin; report given to [facility] RN; pt belongings taken to [place]; Pt's sister notifed of events by NP Lissa Merlin.

## 2013-11-08 NOTE — Significant Event (Signed)
Rapid Response Event Note  Overview: Time Called: 1121 Arrival Time: 7673 Event Type: Respiratory  Initial Focused Assessment: Called by Endo RN, for patient in Respiratory distress.  Upon my arrival to endo suite, patient on NRB mask sats 100%, Hr 100's 110's/50, not responsive.  As per RN patient became very cyanotic and guppy breathing.  Patients primary RN at bedside, Bryson Ha, NP and Dr. Billee Cashing Clung at bedside.   Interventions: FFP stopped, Lasix given  RT called for ABG and ventilator. PCXR obtained.  Patient intubated by anesthesia.  Vomiting Frank red blood, patient placed on ventilator   Event Summary: Patient on monitor and transported to [place] 12   at      at          [facility], Harlin Rain

## 2013-11-08 NOTE — Interval H&P Note (Signed)
History and Physical Interval Note:  11/08/2013 3:40 PM  Oscar Swanson  has presented today for surgery, with the diagnosis of GI bleed  The various methods of treatment have been discussed with the patient and family. After consideration of risks, benefits and other options for treatment, the patient has consented to  Procedure(s) with comments: ESOPHAGOGASTRODUODENOSCOPY (EGD) (N/A) - bedside as a surgical intervention .  The patient's history has been reviewed, patient examined, no change in status, stable for surgery.  I have reviewed the patient's chart and labs.  Questions were answered to the patient's satisfaction.     Milus Banister

## 2013-11-08 NOTE — Progress Notes (Signed)
Pt transferred to [place] pt unresponsive intubated bleeding. Pt at this is not on any sedation nor receiving blood. MD notified to see patient

## 2013-11-08 NOTE — Progress Notes (Signed)
ANTIBIOTIC CONSULT NOTE - INITIAL  Pharmacy Consult for unasyn Indication: aspiration pneumonia  No Known Allergies  Patient Measurements: Height: 5' 8.9" (175 cm) Weight: 197 lb 15.6 oz (89.8 kg) IBW/kg (Calculated) : 70.47  Vital Signs: Temp: 98.6 F (37 C) (01/09 1258) Temp src: Oral (01/09 1258) BP: 101/56 mmHg (01/09 1330) Pulse Rate: 78 (01/09 1330) Intake/Output from previous day: 01/08 0701 - 01/09 0700 In: 752.1 [I.V.:752.1] Out: 300 [Urine:300] Intake/Output from this shift: Total I/O In: 517.5 [I.V.:492.5; Blood:25] Out: 1050 [Urine:1050]  Labs:  Recent Labs  11/07/13 1927 11/08/13 0313 11/08/13 0730 11/08/13 1230  WBC 13.6* 17.4* 20.9* 21.4*  HGB 10.9* 8.2* 8.2* 7.9*  PLT 84* 84* 89* 102*  CREATININE 0.96  --   --   --    Estimated Creatinine Clearance: 99.6 ml/min (by C-G formula based on Cr of 0.96). No results found for this basename: VANCOTROUGH, Corlis Leak, VANCORANDOM, GENTTROUGH, GENTPEAK, GENTRANDOM, TOBRATROUGH, TOBRAPEAK, TOBRARND, AMIKACINPEAK, AMIKACINTROU, AMIKACIN,  in the last 72 hours   Microbiology: Recent Results (from the past 720 hour(s))  MRSA PCR SCREENING     Status: None   Collection Time    11/07/13 11:26 PM      Result Value Range Status   MRSA by PCR NEGATIVE  NEGATIVE Final   Comment:            The GeneXpert MRSA Assay (FDA     approved for NASAL specimens     only), is one component of a     comprehensive MRSA colonization     surveillance program. It is not     intended to diagnose MRSA     infection nor to guide or     monitor treatment for     MRSA infections.    Medical History: Past Medical History  Diagnosis Date  . Diabetes mellitus without complication   . Schizophrenia     onset age 7.  in and out of psych facilities from age 9 to age 8.  s/p electroconvulsive therapy.  . TB (tuberculosis)     sister says this dx'd at age 67 and treated   Assessment: 53 year old male w/ h/o schizophrenia (not  on meds) and ETOH, admitted on 1/8 w/ acute upper GI bleed and acute agitated encephalopathy. No fevers noted, wbc is trending up to 21.4 this afternoon. Renal function is normal. Pharmacy consulted to start unasyn for possible aspiration.  Goal of Therapy:  Eradication of infection  Plan:  Measure antibiotic drug levels at steady state Follow up culture results Unasyn 3g IV q6 hours  Erin Hearing PharmD., BCPS Clinical Pharmacist Pager [phone] 11/08/2013 1:56 PM

## 2013-11-08 NOTE — Progress Notes (Signed)
Pt observed to have poor skin color, cyanotic,  Labored resp ,and  blood around lips from GI bleed.. Sat 88%placed on O2 at 4 liters, sat immediately up to 94, skin now pink tinged. Repositioned in bed to increase chest excursion.Marland KitchenMarland KitchenLungs at auscultation with slight rub appreciated in L upper lung field. No rales present. Shallow resp effort noted.  B/p wnl, 134/69, FFP infusing.   Patient lethargic, hot to touch, and resp effort still remains shallow, non rebreather mask at 100% placed on pt. PA and  Rapid response nurse called by Agricultural consultant.  Anesthesia also made aware.

## 2013-11-08 NOTE — Care Management Note (Addendum)
    Page 1 of 2   11/15/2013     1:33:45 PM   CARE MANAGEMENT NOTE 11/15/2013  Patient:  Oscar Swanson, Oscar Swanson   Account Number:  [id number]  Date Initiated:  11/08/2013  Documentation initiated by:  Elissa Hefty  Subjective/Objective Assessment:   adm from endo lab, vent     Action/Plan:   lives w sister, pcp dr Jenetta Downer jegede   Anticipated DC Date:  11/15/2013   Anticipated DC Plan:  [place]         Choice offered to / List presented to:  C-1 Patient   DME arranged  Vassie Moselle      DME agency  [place] arranged  HH-1 RN      [place].   Status of service:  Completed, signed off Medicare Important Message given?   (If response is "NO", the following Medicare IM given date fields will be blank) Date Medicare IM given:   Date Additional Medicare IM given:    Discharge Disposition:  [place]  Per UR Regulation:  Reviewed for med. necessity/level of care/duration of stay  If discussed at [place] of Stay Meetings, dates discussed:   11/12/2013  11/14/2013    Comments:  11-15-13 Received referral to arrange appointment for patient at [facility] [phone] . Patient has already left . attempted to research patient / sister no answer . Magdalen Spatz RN BSN [phone]

## 2013-11-08 NOTE — Anesthesia Preprocedure Evaluation (Signed)
Anesthesia Evaluation  Patient identified by MRN, date of birth, ID band Patient unresponsive    Reviewed: Unable to perform ROS - Chart review only  Airway Mallampati: II      Dental   Pulmonary Current Smoker,          Cardiovascular     Neuro/Psych PSYCHIATRIC DISORDERS    GI/Hepatic   Endo/Other    Renal/GU      Musculoskeletal   Abdominal   Peds  Hematology   Anesthesia Other Findings   Reproductive/Obstetrics                           Anesthesia Physical Anesthesia Plan  ASA: IV and emergent  Anesthesia Plan: General ETT   Post-op Pain Management:    Induction:   Airway Management Planned:   Additional Equipment:   Intra-op Plan:   Post-operative Plan:   Informed Consent:   History available from chart only and Only emergency history available  Plan Discussed with:   Anesthesia Plan Comments:         Anesthesia Quick Evaluation

## 2013-11-08 NOTE — Progress Notes (Signed)
I was called to go to a rapid response in progress for an ABG. Upon entering room, pt was intubated by anesthesia w/ 7.5 OET secure at 23 at lip.  Pt was transported via vent 100% fio2 to 2H12 w/ no apparent complications.  Waiting on vent orders from CCM, ICU RT given report.

## 2013-11-08 NOTE — Procedures (Signed)
Central Venous Catheter Insertion Procedure Note BROEDY OSBOURNE [id number] April 07, 1961  Procedure: Insertion of Central Venous Catheter Indications: Assessment of intravascular volume, Drug and/or fluid administration and Frequent blood sampling  Procedure Details Consent: Unable to obtain consent because of emergent medical necessity. Time Out: Verified patient identification, verified procedure, site/side was marked, verified correct patient position, special equipment/implants available, medications/allergies/relevent history reviewed, required imaging and test results available.  Performed  Maximum sterile technique was used including antiseptics, cap, gloves, gown, hand hygiene, mask and sheet. Skin prep: Chlorhexidine; local anesthetic administered A antimicrobial bonded/coated triple lumen catheter was placed in the right subclavian vein using the Seldinger technique.  Evaluation Blood flow good Complications: No apparent complications Patient did tolerate procedure well. Chest X-ray ordered to verify placement.  CXR: pending.  BABCOCK,PETE 11/08/2013, 1:57 PM  Mariel Sleet Beeper  [phone]  Cell  [phone]  If no response or cell goes to voicemail, call beeper [phone]

## 2013-11-08 NOTE — Anesthesia Postprocedure Evaluation (Signed)
  Anesthesia Post-op Note  Patient: Oscar Swanson  Procedure(s) Performed: Procedure(s): ESOPHAGOGASTRODUODENOSCOPY (EGD) (N/A)  Patient Location: Endoscopy Unit  Anesthesia Type:General  Level of Consciousness: Patient remains intubated per anesthesia plan  Airway and Oxygen Therapy: Patient remains intubated per anesthesia plan and Patient placed on Ventilator (see vital sign flow sheet for setting)  Post-op Pain: none  Post-op Assessment: Post-op Vital signs reviewed  Post-op Vital Signs: Reviewed  Complications: No apparent anesthesia complications

## 2013-11-08 NOTE — Consult Note (Signed)
[facility] Consult: 1:45 PM 11/08/2013  LOS: 1 day    Referring Provider: Dr Marin Comment Primary Care Physician:  Angelica Chessman, MD Primary Gastroenterologist:  Althia Forts.    Reason for Consultation:  hemetemesis   HPI: Oscar Swanson is a 53 y.o. male.  Long hx of schizophrenia since age 8.  Many spych hospitalizations until he left the last facility at age 40.  Disabled sister has managed to set him up in his own home where he lives independently.  No psych meds for 3 years.  Used to take oral agent for DM, but not on this now.  Established care with Dr Gabrielle Dare in fall 2014, at which time a MDI was prescribed but no diabetic meds.  Sister says he takes 3 to 4 Advil daily for MS pain, but she is not sure he takes Naprosyn.  She says he drinks beer but has never been a heavy drinker.  Smoker who has decreased down to 10 cigs per day.  Remotely had abnormal LFTs secondary to psych meds.  On Wednesday pt visited his sister and he was doing well, walking/talking without obvious problems, ate well as he usually does.  Then yesterday pts nephew went by and noted pt to have vomited and stooled red blood and he had AMS.  Pt refused to go to hospital, but agreed to EMS transport after sister arrived and convinced him. She says there was bloody stool and emesis, dried blood at nose.  Pt was nearly obtunded but recognized her.  He was given Haldol and Ativan and is completely obtunded this AM. No recurrent hematemesis or melena/BPR since admission to SDU last night.  On PPI drip and given Vitamin K 5 mg IV at midnight.   Coags abnormal, Hgb 8.2.  Initially 9.4 to 10.9 at arrival.  Platelets in 80s, LFTs abnormal and physical exam suggests liver disease, though no imaging to confirm this.   I did not ask sister about pt's  drug habits but tox screen is + for cocaine.     Past Medical History  Diagnosis Date  . Diabetes mellitus without complication   . Schizophrenia     onset age 37.  in and out of psych facilities from age 64 to age 44.  s/p electroconvulsive therapy.  . 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 Cataract surgery       Prior to Admission medications   Medication Sig Start Date End Date Taking? Authorizing Provider         Advil   200 mg   3 to 4 x per day Some sort of inhaler.   Scheduled Meds: . antiseptic oral rinse  15 mL Mouth Rinse QID  . chlorhexidine  15 mL Mouth Rinse BID  . fentaNYL      . insulin aspart  0-9 Units Subcutaneous Q4H  . sodium chloride  3 mL Intravenous Q12H  . thiamine  100 mg Intravenous Daily  Infusions: . sodium chloride 150 mL/hr at 11/08/13 1243  . dexmedetomidine    . pantoprozole (PROTONIX) infusion 8 mg/hr (11/08/13 1300)   PRN Meds: fentaNYL, ondansetron (ZOFRAN) IV   Allergies as of 11/07/2013  . (No Known Allergies)    Family History  Problem Relation Age of Onset  . Hypertension Mother     History   Social History  . Marital Status: Single    Spouse Name: N/A    Number of Children: N/A  . Years of Education: N/A   Occupational History  . Not on file.   Social History Main Topics  . Smoking status: Heavy Tobacco Smoker -- 0.50 packs/day for 25 years    Types: Cigarettes  . Smokeless tobacco: Not on file  . Alcohol Use: Yes     Comment:  drink beer socially   . Drug Use: No  . Sexual Activity: Not on file   Other Topics Concern  . Not on file   Social History Narrative  . No narrative on file    REVIEW OF SYSTEMS: No weight loss, eats well.  No activity limitations, no recent illnesses.  Unable to get hx from pt.   PHYSICAL EXAM: Vital signs in last 24 hours: Filed Vitals:   11/08/13 1300  BP: 98/45  Pulse: 87  Temp:     Resp: 19   Wt Readings from Last 3 Encounters:  11/08/13 89.8 kg (197 lb 15.6 oz)  11/08/13 89.8 kg (197 lb 15.6 oz)  11/08/13 89.8 kg (197 lb 15.6 oz)    General: ashen, pale, looks unwell.  Looks old for age Head:  No signs of trauma, no edema or asymettry  Eyes:  No icterus, no jaundice Ears:  Unable to assess hearing. Nose:  dried blood left nares  Mouth:  Clamps mouth shut on exam.  Dried blood stain teeth and lips Neck:  No mass, no JVD Lungs:  Clear bil.  Breathing not labored.  No gynecomastia. Heart: RRR, slightly tachy. Abdomen:  Bulging flanks, no succusion splash. No organomegaly.  Not tender, no bruits or hernia.  BS active Rectal: deferred   Musc/Skeltl: no joint contracture or deformity Extremities:  No pedal edema.  Feet warm  Neurologic:  Obtunded, no asterixis or tremor Skin:  Few angiomata on upper trunk Tattoos:  none Nodes:  No cervical adenopathy   Psych:  Unable to assess, not agitated.   Intake/Output from previous day: 01/08 0701 - 01/09 0700 In: 752.1 [I.V.:752.1] Out: 300 [Urine:300] Intake/Output this shift: Total I/O In: 517.5 [I.V.:492.5; Blood:25] Out: 1050 [Urine:1050]  LAB RESULTS:  Recent Labs  11/08/13 0313 11/08/13 0730 11/08/13 1230  WBC 17.4* 20.9* 21.4*  HGB 8.2* 8.2* 7.9*  HCT 24.1* 23.4* 23.3*  PLT 84* 89* 102*   BMET Lab Results  Component Value Date   NA 136* 11/07/2013   K 4.3 11/07/2013   CL 104 11/07/2013   CO2 23 11/07/2013   GLUCOSE 127* 11/07/2013   BUN 33* 11/07/2013   CREATININE 0.96 11/07/2013   CALCIUM 8.5 11/07/2013   LFT  Recent Labs  11/07/13 1927  PROT 6.2  ALBUMIN 2.6*  AST 86*  ALT 78*  ALKPHOS 120*  BILITOT 1.3*   PT/INR Lab Results  Component Value Date   INR 1.66* 11/08/2013   INR 1.61* 11/07/2013   Hepatitis Panel No results found for this basename: HEPBSAG, HCVAB, HEPAIGM, HEPBIGM,  in the last 72 hours Lipase     Component Value Date/Time   LIPASE  41 11/07/2013 1927    Drugs of Abuse      Component Value Date/Time   LABOPIA NONE DETECTED 11/07/2013 2020   COCAINSCRNUR POSITIVE* 11/07/2013 2020   LABBENZ NONE DETECTED 11/07/2013 2020   AMPHETMU NONE DETECTED 11/07/2013 2020   THCU NONE DETECTED 11/07/2013 2020   LABBARB NONE DETECTED 11/07/2013 2020     RADIOLOGY STUDIES: Ct Head Wo Contrast 11/07/2013   CLINICAL DATA:  Vomiting blood with slurred speech and confusion.  EXAM: CT HEAD WITHOUT CONTRAST  TECHNIQUE: Contiguous axial images were obtained from the base of the skull through the vertex without intravenous contrast.  COMPARISON:  None.  FINDINGS: The patient's head is mildly tilted to the right.  There is no evidence of acute intracranial hemorrhage, mass lesion, brain edema or extra-axial fluid collection. The ventricles and subarachnoid spaces are appropriately sized for age. There is no CT evidence of acute cortical infarction.  The visualized paranasal sinuses, mastoid air cells and middle ears are clear. The calvarium is intact.  IMPRESSION: Unremarkable noncontrast head CT.   Electronically Signed   By: Camie Patience M.D.   On: 11/07/2013 19:46    ENDOSCOPIC STUDIES: Nothing in Epic  IMPRESSION:   *  Upper GI bleed.  Rule out NSAID ulcer .  Rule out esophageal varices.  Rule out MW tear.  *  Coagulopathy, thrombocytopenia and likely ascites on physical exam:  Rule out cirrhosis.  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  No meds for last 3 years.  *  AMS and sister's impression of left facial droop, head Ct negative. tox screen + for cocaine.  Need to inquire with sister as to pt's drug use.   *  Hx of type 2 DM, non med requiring.  By sister's report he eats healthy diet.     PLAN:     *  EGD.  This after coags corrected with FFP.  Called blood bank to confirm emergency release of FFP. *  Check serologies   Azucena Freed  11/08/2013, 1:45 PM Pager: [phone]    ________________________________________________________________________  Velora Heckler GI MD  note:  I personally examined the patient, reviewed the data.  I first saw him in endo admitting.  His is in obvious respiratory distress and needs to be intubated.  Rapid response called.  Floor RN reports no overt GI bleeding since he's been admitted.  Unclear underlying issue here, appears enceophalpathic , perhaps septic. WBC very elevated. He will need further stablization prior to any invasive GI testing.  Will check on him again later in ICU.   Owens Loffler, MD [facility] Pager [phone]

## 2013-11-08 NOTE — Progress Notes (Signed)
Dr Martin[place] in to see pt. Pt had EGD at bedside nurse reports that patient was bleeding and band placed. No further bleed noted at this time.

## 2013-11-08 NOTE — Op Note (Signed)
[facility] [place], [address]   ENDOSCOPY PROCEDURE REPORT  PATIENT: Oscar Swanson, Oscar Swanson  MR#: [id number] BIRTHDATE: 02-11-1961 , 52  yrs. old GENDER: Male ENDOSCOPIST: Milus Banister, Oscar Swanson PROCEDURE DATE:  11/08/2013 PROCEDURE:  EGD w/ band ligation of varices ASA CLASS:     Class IV INDICATIONS:  gi bleeding, suspected cirrhosis. MEDICATIONS: General endotracheal anesthesia (GETA) TOPICAL ANESTHETIC: none  DESCRIPTION OF PROCEDURE: After the risks benefits and alternatives of the procedure were thoroughly explained, informed consent was obtained.  The Pentax Gastroscope [id number] endoscope was introduced through the mouth and advanced to the second portion of the duodenum. Without limitations.  The instrument was slowly withdrawn as the mucosa was fully examine  There was old blood clot and hematin in the stomach.  There were two trunks of medium sized varices, no obvious recent site of bleeding. 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 The blood clot in proximal stomach precluded complete view of the stomach and so perhaps there was offending site underlying.  I chose to band ligate the esopahgeal varices (5 bands placed in good position).  The examination was otherwise normal.  Retroflexed views revealed no abnormalities.     The scope was then withdrawn from the patient and the procedure completed.  COMPLICATIONS: There were no complications. ENDOSCOPIC IMPRESSION: There was old blood clot and hematin in the stomach.  There were two trunks of medium sized varices, no obvious recent site of bleeding. 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 The blood clot in proximal stomach precluded complete view of the stomach and so perhaps there was offending site underlying.  I chose to band ligate the esopahgeal varices (5 bands placed in good position).  The examination was otherwise  normal.  RECOMMENDATIONS: Will start octreotide bolus and drip.  Will need to continue this for 48 hours.  IV PPI twice daily until he can take it orally. Transfuse blood products as needed.   eSigned:  Milus Banister, Oscar Swanson 11/08/2013 5:01 PM

## 2013-11-08 NOTE — H&P (View-Only) (Signed)
[facility] Consult: 1:45 PM 11/08/2013  LOS: 1 day    Referring Provider: Dr Marin Comment Primary Care Physician:  Angelica Chessman, MD Primary Gastroenterologist:  Althia Forts.    Reason for Consultation:  hemetemesis   HPI: Oscar Swanson is a 53 y.o. male.  Long hx of schizophrenia since age 8.  Many spych hospitalizations until he left the last facility at age 40.  Disabled sister has managed to set him up in his own home where he lives independently.  No psych meds for 3 years.  Used to take oral agent for DM, but not on this now.  Established care with Dr Gabrielle Dare in fall 2014, at which time a MDI was prescribed but no diabetic meds.  Sister says he takes 3 to 4 Advil daily for MS pain, but she is not sure he takes Naprosyn.  She says he drinks beer but has never been a heavy drinker.  Smoker who has decreased down to 10 cigs per day.  Remotely had abnormal LFTs secondary to psych meds.  On Wednesday pt visited his sister and he was doing well, walking/talking without obvious problems, ate well as he usually does.  Then yesterday pts nephew went by and noted pt to have vomited and stooled red blood and he had AMS.  Pt refused to go to hospital, but agreed to EMS transport after sister arrived and convinced him. She says there was bloody stool and emesis, dried blood at nose.  Pt was nearly obtunded but recognized her.  He was given Haldol and Ativan and is completely obtunded this AM. No recurrent hematemesis or melena/BPR since admission to SDU last night.  On PPI drip and given Vitamin K 5 mg IV at midnight.   Coags abnormal, Hgb 8.2.  Initially 9.4 to 10.9 at arrival.  Platelets in 80s, LFTs abnormal and physical exam suggests liver disease, though no imaging to confirm this.   I did not ask sister about pt's  drug habits but tox screen is + for cocaine.     Past Medical History  Diagnosis Date  . Diabetes mellitus without complication   . Schizophrenia     onset age 37.  in and out of psych facilities from age 64 to age 44.  s/p electroconvulsive therapy.  . 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 Cataract surgery       Prior to Admission medications   Medication Sig Start Date End Date Taking? Authorizing Provider         Advil   200 mg   3 to 4 x per day Some sort of inhaler.   Scheduled Meds: . antiseptic oral rinse  15 mL Mouth Rinse QID  . chlorhexidine  15 mL Mouth Rinse BID  . fentaNYL      . insulin aspart  0-9 Units Subcutaneous Q4H  . sodium chloride  3 mL Intravenous Q12H  . thiamine  100 mg Intravenous Daily  Infusions: . sodium chloride 150 mL/hr at 11/08/13 1243  . dexmedetomidine    . pantoprozole (PROTONIX) infusion 8 mg/hr (11/08/13 1300)   PRN Meds: fentaNYL, ondansetron (ZOFRAN) IV   Allergies as of 11/07/2013  . (No Known Allergies)    Family History  Problem Relation Age of Onset  . Hypertension Mother     History   Social History  . Marital Status: Single    Spouse Name: N/A    Number of Children: N/A  . Years of Education: N/A   Occupational History  . Not on file.   Social History Main Topics  . Smoking status: Heavy Tobacco Smoker -- 0.50 packs/day for 25 years    Types: Cigarettes  . Smokeless tobacco: Not on file  . Alcohol Use: Yes     Comment:  drink beer socially   . Drug Use: No  . Sexual Activity: Not on file   Other Topics Concern  . Not on file   Social History Narrative  . No narrative on file    REVIEW OF SYSTEMS: No weight loss, eats well.  No activity limitations, no recent illnesses.  Unable to get hx from pt.   PHYSICAL EXAM: Vital signs in last 24 hours: Filed Vitals:   11/08/13 1300  BP: 98/45  Pulse: 87  Temp:     Resp: 19   Wt Readings from Last 3 Encounters:  11/08/13 89.8 kg (197 lb 15.6 oz)  11/08/13 89.8 kg (197 lb 15.6 oz)  11/08/13 89.8 kg (197 lb 15.6 oz)    General: ashen, pale, looks unwell.  Looks old for age Head:  No signs of trauma, no edema or asymettry  Eyes:  No icterus, no jaundice Ears:  Unable to assess hearing. Nose:  dried blood left nares  Mouth:  Clamps mouth shut on exam.  Dried blood stain teeth and lips Neck:  No mass, no JVD Lungs:  Clear bil.  Breathing not labored.  No gynecomastia. Heart: RRR, slightly tachy. Abdomen:  Bulging flanks, no succusion splash. No organomegaly.  Not tender, no bruits or hernia.  BS active Rectal: deferred   Musc/Skeltl: no joint contracture or deformity Extremities:  No pedal edema.  Feet warm  Neurologic:  Obtunded, no asterixis or tremor Skin:  Few angiomata on upper trunk Tattoos:  none Nodes:  No cervical adenopathy   Psych:  Unable to assess, not agitated.   Intake/Output from previous day: 01/08 0701 - 01/09 0700 In: 752.1 [I.V.:752.1] Out: 300 [Urine:300] Intake/Output this shift: Total I/O In: 517.5 [I.V.:492.5; Blood:25] Out: 1050 [Urine:1050]  LAB RESULTS:  Recent Labs  11/08/13 0313 11/08/13 0730 11/08/13 1230  WBC 17.4* 20.9* 21.4*  HGB 8.2* 8.2* 7.9*  HCT 24.1* 23.4* 23.3*  PLT 84* 89* 102*   BMET Lab Results  Component Value Date   NA 136* 11/07/2013   K 4.3 11/07/2013   CL 104 11/07/2013   CO2 23 11/07/2013   GLUCOSE 127* 11/07/2013   BUN 33* 11/07/2013   CREATININE 0.96 11/07/2013   CALCIUM 8.5 11/07/2013   LFT  Recent Labs  11/07/13 1927  PROT 6.2  ALBUMIN 2.6*  AST 86*  ALT 78*  ALKPHOS 120*  BILITOT 1.3*   PT/INR Lab Results  Component Value Date   INR 1.66* 11/08/2013   INR 1.61* 11/07/2013   Hepatitis Panel No results found for this basename: HEPBSAG, HCVAB, HEPAIGM, HEPBIGM,  in the last 72 hours Lipase     Component Value Date/Time   LIPASE  41 11/07/2013 1927    Drugs of Abuse      Component Value Date/Time   LABOPIA NONE DETECTED 11/07/2013 2020   COCAINSCRNUR POSITIVE* 11/07/2013 2020   LABBENZ NONE DETECTED 11/07/2013 2020   AMPHETMU NONE DETECTED 11/07/2013 2020   THCU NONE DETECTED 11/07/2013 2020   LABBARB NONE DETECTED 11/07/2013 2020     RADIOLOGY STUDIES: Ct Head Wo Contrast 11/07/2013   CLINICAL DATA:  Vomiting blood with slurred speech and confusion.  EXAM: CT HEAD WITHOUT CONTRAST  TECHNIQUE: Contiguous axial images were obtained from the base of the skull through the vertex without intravenous contrast.  COMPARISON:  None.  FINDINGS: The patient's head is mildly tilted to the right.  There is no evidence of acute intracranial hemorrhage, mass lesion, brain edema or extra-axial fluid collection. The ventricles and subarachnoid spaces are appropriately sized for age. There is no CT evidence of acute cortical infarction.  The visualized paranasal sinuses, mastoid air cells and middle ears are clear. The calvarium is intact.  IMPRESSION: Unremarkable noncontrast head CT.   Electronically Signed   By: Camie Patience M.D.   On: 11/07/2013 19:46    ENDOSCOPIC STUDIES: Nothing in Epic  IMPRESSION:   *  Upper GI bleed.  Rule out NSAID ulcer .  Rule out esophageal varices.  Rule out MW tear.  *  Coagulopathy, thrombocytopenia and likely ascites on physical exam:  Rule out cirrhosis.  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  No meds for last 3 years.  *  AMS and sister's impression of left facial droop, head Ct negative. tox screen + for cocaine.  Need to inquire with sister as to pt's drug use.   *  Hx of type 2 DM, non med requiring.  By sister's report he eats healthy diet.     PLAN:     *  EGD.  This after coags corrected with FFP.  Called blood bank to confirm emergency release of FFP. *  Check serologies   Azucena Freed  11/08/2013, 1:45 PM Pager: [phone]    ________________________________________________________________________  Velora Heckler GI MD  note:  I personally examined the patient, reviewed the data.  I first saw him in endo admitting.  His is in obvious respiratory distress and needs to be intubated.  Rapid response called.  Floor RN reports no overt GI bleeding since he's been admitted.  Unclear underlying issue here, appears enceophalpathic , perhaps septic. WBC very elevated. He will need further stablization prior to any invasive GI testing.  Will check on him again later in ICU.   Owens Loffler, MD [facility] Pager [phone]

## 2013-11-08 NOTE — Transfer of Care (Signed)
Immediate Anesthesia Transfer of Care Note  Patient: Oscar Swanson  Procedure(s) Performed: Procedure(s): ESOPHAGOGASTRODUODENOSCOPY (EGD) (N/A)  Patient Location: Endoscopy Unit  Anesthesia Type:General  Level of Consciousness: Patient remains intubated per anesthesia plan  Airway & Oxygen Therapy: Patient remains intubated per anesthesia plan and Patient placed on Ventilator (see vital sign flow sheet for setting)  Post-op Assessment: Report given to PACU RN  Post vital signs: Reviewed  Complications: No apparent anesthesia complications

## 2013-11-08 NOTE — Progress Notes (Signed)
INITIAL NUTRITION ASSESSMENT  DOCUMENTATION CODES Per approved criteria  -Not Applicable   INTERVENTION: 1.  Enteral nutrition; if appropriate in pt with acute GI bleed, initiate Vital 1.2 @ 20 mL/hr continuous.  Advance by 10 mL q 4 hrs to 75 mL/hr goal to provide 2160 kcal, 135g protein, 1459 mL free water.  NUTRITION DIAGNOSIS: Inadequate oral intake related to inability to eat as evidenced by NPO, vent.   Monitor:  1.  Enteral nutrition; initiation with tolerance.  Pt to meet >/=90% estimated needs with nutrition support.  2.  Wt/wt change; monitor trends  Reason for Assessment: Vent  53 y.o. male  Admitting Dx: hematemesis   ASSESSMENT: Pt admitted with hematemesis.  Pt found at home with bright red blood in vomit and stool with AMS.  Tox screen is positive for cocaine.   Patient is currently intubated on ventilator support.  MV: 14.3 L/min Temp (24hrs), Avg:99 F (37.2 C), Min:96.3 F (35.7 C), Max:99.8 F (37.7 C)  Propofol: none  Pt with acute upper GI bleed.  Went for endoscopy, however copious amount of melena in esophagus causing respiratory distress.  Pt in sterile procedure at time of visit. RD to follow for ongoing assessment and interventions as needed. Will monitor for progression of care plan.  Height: Ht Readings from Last 1 Encounters:  11/08/13 5' 8.9" (1.75 m)    Weight: Wt Readings from Last 1 Encounters:  11/08/13 197 lb 15.6 oz (89.8 kg)    Ideal Body Weight: 70 kg  % Ideal Body Weight: 128%  Wt Readings from Last 10 Encounters:  11/08/13 197 lb 15.6 oz (89.8 kg)  11/08/13 197 lb 15.6 oz (89.8 kg)  11/08/13 197 lb 15.6 oz (89.8 kg)  09/23/13 202 lb 6.4 oz (91.808 kg)    Usual Body Weight: 200 lbs  % Usual Body Weight: 100%  BMI:  Body mass index is 29.32 kg/(m^2).  Estimated Nutritional Needs: Kcal: 2194 Protein: 116-134g Fluid: >2.2 L/day  Skin: intact  Diet Order: NPO  EDUCATION NEEDS: -No education needs identified  at this time   Intake/Output Summary (Last 24 hours) at 11/08/13 1352 Last data filed at 11/08/13 1300  Gross per 24 hour  Intake 1269.59 ml  Output   1350 ml  Net -80.41 ml    Last BM: 1/9  Labs:   Recent Labs Lab 11/07/13 1927  NA 136*  K 4.3  CL 104  CO2 23  BUN 33*  CREATININE 0.96  CALCIUM 8.5  GLUCOSE 127*    CBG (last 3)   Recent Labs  11/08/13 0435 11/08/13 0812 11/08/13 1309  GLUCAP 128* 140* 165*    Scheduled Meds: . ampicillin-sulbactam (UNASYN) IV  3 g Intravenous Q6H  . antiseptic oral rinse  15 mL Mouth Rinse QID  . chlorhexidine  15 mL Mouth Rinse BID  . fentaNYL      . insulin aspart  0-9 Units Subcutaneous Q4H  . sodium chloride  3 mL Intravenous Q12H  . thiamine  100 mg Intravenous Daily    Continuous Infusions: . sodium chloride 150 mL/hr at 11/08/13 1243  . dexmedetomidine    . pantoprozole (PROTONIX) infusion 8 mg/hr (11/08/13 1300)    Past Medical History  Diagnosis Date  . Diabetes mellitus without complication   . Schizophrenia     onset age 9.  in and out of psych facilities from age 56 to age 69.  s/p electroconvulsive therapy.  . TB (tuberculosis)     sister says this  dx'd at age 16 and treated    Past Surgical History  Procedure Laterality Date  . Cataract surgery       Brynda Greathouse, MS RD LDN Clinical Inpatient Dietitian Pager: [phone] Weekend/After hours pager: [phone]

## 2013-11-08 NOTE — Progress Notes (Signed)
[facility] Progress Note    Oscar Swanson MVH:[id number] DOB: 10-19-1961 DOA: 11/07/2013 PCP: Angelica Chessman, MD  Brief narrative: 53 year old male with apparent history of prior alcohol abuse and schizophrenia who lives at home with his sister. Was brought to the ER because of confusion, agitation and at time of admission he was unable to give history. According to his family and had several episodes of hematemesis and had vomited a moderate amount of bright red blood with clots. He was also having melanotic stool. In the ER he was agitated enough to require restraints as well as administration of Ativan and Haldol. Unknown how much alcohol the patient utilizes. In the ER the patient had sinus tachycardia with rates in the 120s, BP was stable but 120/70. Hemoglobin stable at 10, BUN mildly elevated at 33 with a normal creatinine. His ammonia level was 75 and his INR was mildly elevated at 1.6. Urine drug screen was positive for cocaine. GI had artery been consulted by the emergency room physician.  Assessment/Plan:  1135 am Acute hypoxic resp failure: -after initial exam this am CTSP in endo holding (had not undergone EGD yet) -on exam coarse crackles and labored effort- lethargic -will obtain PCXR,ABG, give 1x LASIX iv 80 MG - ?? Over load vs aspiration event pre admit which is just now declaring itself   -PCCM unable to come rapidly so Anesthesia will intubate pt and stabilize from a respiratory standpoint before tx to ICU -during intubation attempts excessive volume of melena and blood pouring out of ETT which had been placed erroneously into the patient's esophagus by nurse anesthetist   Hepatic encephalopathy -Initial ammonia level 75 -Repeat in a.m.  Acute upper GI bleed -Appreciate GI assistance -Continue n.p.o. -Continue Protonix gtt -Follow CBC -given bleeding noted in endoscopy will check stat coags, CBC, and Type XM  Dehydration/Tachycardia -Continue IVFs  ?  Diabetes -Hemoglobin A1c was 4.06 September 2013 -Doubt carries this diagnosis  Cirrhosis with alcoholism/Mild Coagulopathy/Thrombocytopenia -Continue CIWA-currently lethargic from present sedation versus hepatic encephalopathy -FFP infusing-check PT/INR post infusion -Platelets stable  Smoking  Schizophrenia  Cocaine abuse   DVT prophylaxis: SCDs in setting of GI bleeding Code Status: Full Family Communication: No family at bedside Disposition Plan/Expected LOS: [id number]-1135 am transfer to ICU- d/w Sood who will see pt later   Consultants: Gastroenterology  Procedures: EGD pending  Antibiotics: None  HPI/Subjective: Patient lethargic and unable to answer questions this morning  Objective: Blood pressure 108/47, pulse 103, temperature 99.1 F (37.3 C), temperature source Axillary, resp. rate 30, SpO2 93.00%.  Intake/Output Summary (Last 24 hours) at 11/08/13 1112 Last data filed at 11/08/13 1107  Gross per 24 hour  Intake 1139.59 ml  Output    925 ml  Net 214.59 ml   Exam: General: No acute respiratory distress Lungs: Clear to auscultation bilaterally without wheezes or crackles, RA but does have tachypnea Cardiovascular: Regular card rate and rhythm without murmur gallop or rub normal S1 and S2, trace peripheral edema or JVD Abdomen: Nontender, nondistended, soft, bowel sounds positive, no rebound, ??  ascites, no appreciable mass Musculoskeletal: No significant cyanosis, clubbing of bilateral lower extremities Neurological: Lethargic but no apparent focal neurological deficit  Scheduled Meds:  Scheduled Meds: . folic acid  1 mg Oral Daily  . insulin aspart  0-9 Units Subcutaneous Q4H  . LORazepam  0-4 mg Intravenous Q6H   Followed by  . [START ON 11/10/2013] LORazepam  0-4 mg Intravenous Q12H  . multivitamin with minerals  1 tablet  Oral Daily  . sodium chloride  3 mL Intravenous Q12H  . thiamine  100 mg Oral Daily   Or  . thiamine  100 mg Intravenous Daily     Continuous Infusions: . dextrose 5 % and 0.9% NaCl 100 mL/hr at 11/08/13 0023  . pantoprozole (PROTONIX) infusion 8 mg/hr (11/08/13 0906)   Data Reviewed: Basic Metabolic Panel:  Recent Labs Lab 11/07/13 1927  NA 136*  K 4.3  CL 104  CO2 23  GLUCOSE 127*  BUN 33*  CREATININE 0.96  CALCIUM 8.5   Liver Function Tests:  Recent Labs Lab 11/07/13 1927  AST 86*  ALT 78*  ALKPHOS 120*  BILITOT 1.3*  PROT 6.2  ALBUMIN 2.6*    Recent Labs Lab 11/07/13 1927  LIPASE 41    Recent Labs Lab 11/07/13 1927  AMMONIA 79*   CBC:  Recent Labs Lab 11/07/13 0013 11/07/13 1927 11/08/13 0313 11/08/13 0730  WBC 17.9* 13.6* 17.4* 20.9*  NEUTROABS  --  9.1*  --   --   HGB 9.4* 10.9* 8.2* 8.2*  HCT 26.9* 31.9* 24.1* 23.4*  MCV 98.2 98.5 98.8 98.3  PLT 79* 84* 84* 89*   Cardiac Enzymes: No results found for this basename: CKTOTAL, CKMB, CKMBINDEX, TROPONINI,  in the last 168 hours BNP (last 3 results) No results found for this basename: PROBNP,  in the last 8760 hours CBG:  Recent Labs Lab 11/07/13 1852 11/08/13 0020 11/08/13 0435 11/08/13 0812  GLUCAP 117* 113* 128* 140*    Recent Results (from the past 240 hour(s))  MRSA PCR SCREENING     Status: None   Collection Time    11/07/13 11:26 PM      Result Value Range Status   MRSA by PCR NEGATIVE  NEGATIVE Final   Comment:            The GeneXpert MRSA Assay (FDA     approved for NASAL specimens     only), is one component of a     comprehensive MRSA colonization     surveillance program. It is not     intended to diagnose MRSA     infection nor to guide or     monitor treatment for     MRSA infections.     Studies:  Recent x-ray studies have been reviewed in detail by the Attending Physician     Erin Hearing, ANP [facility] Office  [phone] Pager [phone]  **If unable to reach the above provider after paging please contact the [place] @ [phone]  On-Call/Text Page:       [email or web address]      password [email or web address]  If 7PM-7AM, please contact night-coverage [email or web address] Password [email or web address] 11/08/2013, 11:12 AM   LOS: 1 day   I have personally examined this patient and reviewed the entire database. I have reviewed the above note, made any necessary editorial changes, and agree with its content.  1hr of critical care time dedicated to care of pt at bedside (11:30am to 12:30PM) due to acute hypoxic respiratory failure, hemodynamic instability/hypotension, and gross high volume hematemesis   Cherene Altes, MD [facility]

## 2013-11-08 NOTE — ED Provider Notes (Signed)
I saw and evaluated the patient, reviewed the resident's note and I agree with the findings and plan.  EKG Interpretation    Date/Time:  Thursday November 07 2013 19:47:27 EST Ventricular Rate:  89 PR Interval:  97 QRS Duration: 88 QT Interval:  395 QTC Calculation: 481 R Axis:   78 Text Interpretation:  Age not entered, assumed to be  52 years old for purpose of ECG interpretation Sinus rhythm Short PR interval Abnormal R-wave progression, early transition Minimal ST depression, inferior leads Borderline prolonged QT interval Confirmed by Zenia Resides  MD, Charene Mccallister ([id number]) on 11/07/2013 8:01:55 PM             Leota Jacobsen, MD 11/08/13 [phone]

## 2013-11-08 NOTE — Progress Notes (Signed)
Called to bedside to assess need for vasopressors. On Exam sedated, hypotensive with MAP < 50. Not responding to fluid bolus. Will start levophed.

## 2013-11-08 NOTE — Consult Note (Signed)
Name: Oscar Swanson MRN: [id number] DOB: [birth year]    ADMISSION DATE:  11/07/2013 CONSULTATION DATE:  1/9  REFERRING MD :  Sharon Seller  PRIMARY SERVICE: [facility]   CHIEF COMPLAINT:  Acute GI bleed, Encephalopathy  BRIEF PATIENT DESCRIPTION:  53 year old male w/ h/o schizophrenia (not on meds) and ETOH,  admitted on 1/8 w/ acute upper GI bleed and acute agitated encephalopathy. Agitation was treated w/ lorazepam and Haldol. Prior to ENDO was sedated but stable awaiting EGD. Developed acute respiratory distress in Endo holding awaiting EGD on 1/9, emergently intubated.   SIGNIFICANT EVENTS / STUDIES:  1/8 admitted w/ acute encephalopathy and UGIB, treated w/ PPI gtt, transfusion and sedation w/ plan for EGD 1/9 developed acute resp distress. Required emergent intubation by anesthesia. Initially intubated esophagus w/ large vol bloody gastric output. Moved to ICU for stabilization. 1/9 EGD ([facility])>>>  1/8 CT head: negative   LINES / TUBES: OETT 1/9>>> Right [place] CVL 1/9>>>  CULTURES: Sputum 1/9>>>  ANTIBIOTICS: Unasyn 1/9>>>  HISTORY OF PRESENT ILLNESS:   53 year old male with apparent history of prior alcohol abuse and schizophrenia who lives  with his sister. Was brought to the ER 1/8 because of confusion & agitation. According to his family and had several episodes of hematemesis and had vomited a moderate amount of bright red blood with clots. He was also having melanotic stool. In the ER he was agitated enough to require restraints as well as administration of Ativan and Haldol. His ammonia level was 75 and his INR was mildly elevated at 1.6. Urine drug screen was positive for cocaine. He was admitted for supportive care and evaluation of UGIB with initial treatment consisting of PPI infusion, FFP to correct coagulopathy and plan for EEG. On 1/9 was in ENDO developed acute resp distress. Required intubation by anesthesia, per note initially intubated esophagus w/  large volume volume bright red blood from stomach. Tube left in place during second attempt which was successful. Pt was stabilized, and transferred to ICU. PCCM asked to assume care w/ plan to re-attempt EGD later.    PAST MEDICAL HISTORY :  Past Medical History  Diagnosis Date  . Diabetes mellitus without complication   . Schizophrenia     onset age 72.  in and out of psych facilities from age 79 to age 54.  s/p electroconvulsive therapy.  . 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 Cataract surgery      Prior to Admission medications   Medication Sig Start Date End Date Taking? Authorizing Provider  GLIPIZIDE PO Take by mouth.    Historical Provider, MD  naproxen (NAPROSYN) 500 MG tablet Take 1 tablet (500 mg total) by mouth 2 (two) times daily with a meal. 09/23/13   Doris Cheadle, MD  nicotine (NICODERM CQ) 21 mg/24hr patch Place 1 patch (21 mg total) onto the skin daily. 09/23/13   Doris Cheadle, MD   No Known Allergies  FAMILY HISTORY:  Family History  Problem Relation Age of Onset  . Hypertension Mother    SOCIAL HISTORY:  reports that he has been smoking Cigarettes.  He has a 12.5 pack-year smoking history. He does not have any smokeless tobacco history on file. He reports that he drinks alcohol. He reports that he does not use illicit drugs.  REVIEW OF SYSTEMS:  Unable   SUBJECTIVE:  Sedated on vent  VITAL SIGNS: Temp:  [96.3 F (35.7 C)-99.8 F (37.7 C)] 98.6 F (37 C) (01/09 1258) Pulse Rate:  [87-107] 87 (01/09 1300) Resp:  [19-30] 19 (01/09 1300) BP: (97-143)/(32-71) 98/45 mmHg (01/09 1300) SpO2:  [93 %-100 %] 100 % (01/09 1300) FiO2 (%):  [100 %] 100 % (01/09 1300) Weight:  [89.8 kg (197 lb 15.6 oz)] 89.8 kg (197 lb 15.6 oz) (01/09 1244) HEMODYNAMICS: NO pressors   VENTILATOR SETTINGS: Vent Mode:  [-] PRVC FiO2 (%):  [100 %] 100 % Set Rate:  [15 bmp] 15 bmp Vt Set:  [500  mL] 500 mL PEEP:  [5 cmH20] 5 cmH20 INTAKE / OUTPUT: Intake/Output     01/08 0701 - 01/09 0700 01/09 0701 - 01/10 0700   I.V. (mL/kg) 752.1 492.5 (5.5)   Blood  25   Total Intake(mL/kg) 752.1 517.5 (5.8)   Urine (mL/kg/hr) 300 1050 (1.9)   Total Output 300 1050   Net +452.1 -532.5        Stool Occurrence 1 x    Emesis Occurrence 1 x      PHYSICAL EXAMINATION: General:  Chronically ill appearing white male, sedated on vent  Neuro:  sedated HEENT:  Orally intubated, poor dentition, OGT w/ dk bloody d/c  Cardiovascular:  rrr Lungs:  Scattered rhonchi  Abdomen:  Soft, no sig organomegaly + Bowel sounds, bloody d/c from OGT  Musculoskeletal:  Intact Skin:  Raised macular erythremic rash, currently mostly over lower extremities but was also on trunk prior to receiving benadryl   LABS:  CBC  Recent Labs Lab 11/08/13 0313 11/08/13 0730 11/08/13 1230  WBC 17.4* 20.9* 21.4*  HGB 8.2* 8.2* 7.9*  HCT 24.1* 23.4* 23.3*  PLT 84* 89* 102*   Coag's  Recent Labs Lab 11/07/13 1927 11/08/13 1230  APTT  --  31  INR 1.61* 1.66*   BMET  Recent Labs Lab 11/07/13 1927  NA 136*  K 4.3  CL 104  CO2 23  BUN 33*  CREATININE 0.96  GLUCOSE 127*   Electrolytes  Recent Labs Lab 11/07/13 1927  CALCIUM 8.5   Sepsis Markers  Recent Labs Lab 11/07/13 1940  LATICACIDVEN 2.58*   ABG No results found for this basename: PHART, PCO2ART, PO2ART,  in the last 168 hours Liver Enzymes  Recent Labs Lab 11/07/13 1927  AST 86*  ALT 78*  ALKPHOS 120*  BILITOT 1.3*  ALBUMIN 2.6*   Cardiac Enzymes No results found for this basename: TROPONINI, PROBNP,  in the last 168 hours Glucose  Recent Labs Lab 11/07/13 1852 11/08/13 0020 11/08/13 0435 11/08/13 0812 11/08/13 1309  GLUCAP 117* 113* 128* 140* 165*    Imaging Ct Head Wo Contrast  11/07/2013   CLINICAL DATA:  Vomiting blood with slurred speech and confusion.  EXAM: CT HEAD WITHOUT CONTRAST  TECHNIQUE: Contiguous  axial images were obtained from the base of the skull through the vertex without intravenous contrast.  COMPARISON:  None.  FINDINGS: The patient's head is mildly tilted to the right.  There is no evidence of acute intracranial hemorrhage, mass lesion, brain edema or extra-axial fluid collection. The ventricles and subarachnoid spaces are appropriately sized for age. There is no CT evidence of acute cortical infarction.  The visualized paranasal sinuses, mastoid air cells and middle ears are clear. The calvarium is intact.  IMPRESSION: Unremarkable noncontrast head CT.   Electronically Signed   By: Camie Patience M.D.   On: 11/07/2013 19:46   Dg Chest Port 1 View  11/08/2013  CLINICAL DATA:  Respiratory failure, intubation  EXAM: PORTABLE CHEST - 1 VIEW  COMPARISON:  Portable exam 1151 hr without priors for comparison  FINDINGS: Tip of endotracheal tube projects 3.5 above carina.  Normal heart size, mediastinal contours and pulmonary vascularity.  Accentuation of interstitial markings in the mid to lower lungs, uncertain acuity.  No segmental consolidation, pleural effusion or pneumothorax.  Question left nipple shadow.  No acute osseous findings.  IMPRESSION: Satisfactory endotracheal tube position.  Minimal accentuation of interstitial markings, of uncertain acuity.  Questionable left nipple shadow; recommend followup upright PA chest radiograph with nipple markers when the patient's condition permits for further evaluation.   Electronically Signed   By: Lavonia Dana M.D.   On: 11/08/2013 13:08     CXR: ETT good position, early RLL infiltrate   ASSESSMENT / PLAN:  PULMONARY A:  acute respiratory failure in setting of acute encephalopathy and aspiration event  Aspiration PNA      P:   Full vent support  PAD protocol  F/u PCXR and ABG See ID section   CARDIOVASCULAR A:  Hemorrhagic shock in setting of UGIB  P:  Transfuse 2 units PRBC Place CVL given hemodynamic instability  Needs EGD, ?  Varices bleeding   RENAL A:  No acute issue  P:   Trend chemistry  Allow for positive volume status  Renal dose meds  Avoid hypotension   GASTROINTESTINAL A:   Acute UGIB.  Mild abnormal LFTs  Has h/o ETOH.  In setting of abnormal LFTs concerned about cirrhosis. Primary consideration here would be esophageal varices or MW tear   P:   PPI gtt For EGD 1/9  HEMATOLOGIC A:   Acute blood loss anemia Coagulopathy got FFP   P:  Transfuse  No heparin, asa or nsaids  Repeat coags   INFECTIOUS A:  Probable aspiration PNA  P:   Empiric Unasyn  Pct protocol   ENDOCRINE A:  DM  P:   ssi protocol   NEUROLOGIC A:  Acute encephalopathy: unclear how much of this was schizophrenia vs ETOH w/d P:   Sedation protocol  Will try precedex and PRN fentanyl  Supportive care   DERM A:macular raised erythremic rash. Appears to be drug reaction but unsure what from. Seems to have responded to benadryl as it was on trunk but now mostly just some residual on legs P: Close obs w/ medication  Will give quick steroid taper if gets worse   TODAY'S SUMMARY: now intubated. Will transfuse and stabalize for EGD. Hope aspiration event won't prolong vent requirements.   I have personally obtained a history, examined the patient, evaluated laboratory and imaging results, formulated the assessment and plan and placed orders. CRITICAL CARE: The patient is critically ill with multiple organ systems failure and requires high complexity decision making for assessment and support, frequent evaluation and titration of therapies, application of advanced monitoring technologies and extensive interpretation of multiple databases. Critical Care Time devoted to patient care services described in this note is 40  minutes.   Mariel Sleet Beeper  [phone]  Cell  [phone]  If no response or cell goes to voicemail, call beeper [phone]  [facility] [place] Pager: [phone]  11/08/2013, 1:15 PM

## 2013-11-08 NOTE — Progress Notes (Deleted)
[facility] Consult: 9:42 AM 11/08/2013  LOS: 1 day    Referring Provider: Dr Marin Comment Primary Care Physician:  Angelica Chessman, MD Primary Gastroenterologist:  Un assigned.    Reason for Consultation:  hemetemesis   HPI: Oscar Swanson is a 53 y.o. male.  Long hx of schizophrenia.  Many spych hospitalizations until he left the last facility at age 65.  Disabled sister has managed to set him up in his own home where he lives independently.  No psych meds for 3 years.  Used to take oral agent for DM, but not on this now.  Established care with Dr Almetta Lovely in fall 2014, at which time a MDI was prescribed but no diabetic meds.  Sister says he takes 3 to 4 Advil daily for MS pain, but she is not sure he takes Naprosyn.  He drinks beer but has never been a heavy drinker.  Smoker who has decreased down to 10 cigs per day.  Remotely had abnormal LFTs secondary to psych meds.  On Wednesday pt visited his sister and he was doing well, walking/talking without obvious problems, ate well as he usually does.  Then yesterday pts nephew went by and noted pt to have vomited and stooled red blood and he had AMS.  Pt refused to go to hospital, but agreed to EMS transport after sister arrived and convinced him. She says there was bloody stool and emesis, dried blood at nose.  Pt was nearly obtunded but recognized her.  He was given Haldol and Ativan and is completely obtunded this AM. No recurrent hematemesis or melena/BPR since admission to [facility] last night.  On PPI drip and given Vitamin K 5 mg IV at midnight.   Coags abnormal, Hgb 8.2.  Initially 9.4 to 10.9 at arrival.  Platelets in 80s, LFTs abnormal and physical exam suggests liver disease, though no imaging to confirm this.   I did not ask sister about pt's drug habits but tox  screen is + for cocaine.     Past Medical History  Diagnosis Date  . Diabetes mellitus without complication   . Schizophrenia     onset age 38.  in and out of psych facilities from age 87 to age 34.  s/p electroconvulsive therapy.  . TB (tuberculosis)     sister says this dx'd at age 75 and treated    Past Surgical History  Procedure Laterality Date  . Cataract surgery       Prior to Admission medications   Medication Sig Start Date End Date Taking? Authorizing Provider         Advil   200 mg   3 to 4 x per day Some sort of inhaler.   Scheduled Meds: . folic acid  1 mg Oral Daily  . insulin aspart  0-9 Units Subcutaneous Q4H  . LORazepam  0-4 mg Intravenous Q6H   Followed by  . [START ON 11/10/2013] LORazepam  0-4 mg Intravenous Q12H  . multivitamin with minerals  1 tablet Oral Daily  . sodium chloride  3 mL  Intravenous Q12H  . thiamine  100 mg Oral Daily   Or  . thiamine  100 mg Intravenous Daily   Infusions: . dextrose 5 % and 0.9% NaCl 100 mL/hr at 11/08/13 0023  . pantoprozole (PROTONIX) infusion 8 mg/hr (11/08/13 0906)   PRN Meds: LORazepam, LORazepam, ondansetron (ZOFRAN) IV, ondansetron   Allergies as of 11/07/2013  . (No Known Allergies)    Family History  Problem Relation Age of Onset  . Hypertension Mother     History   Social History  . Marital Status: Single    Spouse Name: N/A    Number of Children: N/A  . Years of Education: N/A   Occupational History  . Not on file.   Social History Main Topics  . Smoking status: Heavy Tobacco Smoker -- 0.50 packs/day for 25 years    Types: Cigarettes  . Smokeless tobacco: Not on file  . Alcohol Use: Yes     Comment:  drink beer socially   . Drug Use: No  . Sexual Activity: Not on file   Other Topics Concern  . Not on file   Social History Narrative  . No narrative on file    REVIEW OF SYSTEMS: No weight loss, eats well.  No activity limitations, no recent illnesses.  Unable to get hx from  pt.   PHYSICAL EXAM: Vital signs in last 24 hours: Filed Vitals:   11/08/13 0813  BP: 109/59  Pulse: 100  Temp: 99 F (37.2 C)  Resp: 25   Wt Readings from Last 3 Encounters:  09/23/13 91.808 kg (202 lb 6.4 oz)    General: ashen, pale, looks unwell.  Looks old for age Head:  No signs of trauma, no edema or asymettry  Eyes:  No icterus, no jaundice Ears:  Unable to assess hearing. Nose:  dried blood left nares  Mouth:  Clamps mouth shut on exam.  Dried blood stain teeth and lips Neck:  No mass, no JVD Lungs:  Clear bil.  Breathing not labored.  No gynecomastia. Heart: RRR, slightly tachy. Abdomen:  Bulging flanks, no succusion splash. No organomegaly.  Not tender, no bruits or hernia.  BS active Rectal: deferred   Musc/Skeltl: no joint contracture or deformity Extremities:  No pedal edema.  Feet warm  Neurologic:  Obtunded, no asterixis or tremor Skin:  Few angiomata on upper trunk Tattoos:  none Nodes:  No cervical adenopathy   Psych:  Unable to assess, not agitated.   Intake/Output from previous day: 01/08 0701 - 01/09 0700 In: 752.1 [I.V.:752.1] Out: 300 [Urine:300] Intake/Output this shift: Total I/O In: 125 [I.V.:125] Out: -   LAB RESULTS:  Recent Labs  11/07/13 1927 11/08/13 0313 11/08/13 0730  WBC 13.6* 17.4* 20.9*  HGB 10.9* 8.2* 8.2*  HCT 31.9* 24.1* 23.4*  PLT 84* 84* 89*   BMET Lab Results  Component Value Date   NA 136* 11/07/2013   K 4.3 11/07/2013   CL 104 11/07/2013   CO2 23 11/07/2013   GLUCOSE 127* 11/07/2013   BUN 33* 11/07/2013   CREATININE 0.96 11/07/2013   CALCIUM 8.5 11/07/2013   LFT  Recent Labs  11/07/13 1927  PROT 6.2  ALBUMIN 2.6*  AST 86*  ALT 78*  ALKPHOS 120*  BILITOT 1.3*   PT/INR Lab Results  Component Value Date   INR 1.61* 11/07/2013   Hepatitis Panel No results found for this basename: HEPBSAG, HCVAB, HEPAIGM, HEPBIGM,  in the last 72 hours Lipase     Component Value  Date/Time   LIPASE 41 11/07/2013 1927     Drugs of Abuse     Component Value Date/Time   LABOPIA NONE DETECTED 11/07/2013 2020   COCAINSCRNUR POSITIVE* 11/07/2013 2020   LABBENZ NONE DETECTED 11/07/2013 2020   AMPHETMU NONE DETECTED 11/07/2013 2020   THCU NONE DETECTED 11/07/2013 2020   LABBARB NONE DETECTED 11/07/2013 2020     RADIOLOGY STUDIES: Ct Head Wo Contrast 11/07/2013   CLINICAL DATA:  Vomiting blood with slurred speech and confusion.  EXAM: CT HEAD WITHOUT CONTRAST  TECHNIQUE: Contiguous axial images were obtained from the base of the skull through the vertex without intravenous contrast.  COMPARISON:  None.  FINDINGS: The patient's head is mildly tilted to the right.  There is no evidence of acute intracranial hemorrhage, mass lesion, brain edema or extra-axial fluid collection. The ventricles and subarachnoid spaces are appropriately sized for age. There is no CT evidence of acute cortical infarction.  The visualized paranasal sinuses, mastoid air cells and middle ears are clear. The calvarium is intact.  IMPRESSION: Unremarkable noncontrast head CT.   Electronically Signed   By: Camie Patience M.D.   On: 11/07/2013 19:46    ENDOSCOPIC STUDIES: Nothing in Epic  IMPRESSION:   *  Upper GI bleed.  Rule out NSAID ulcer .  Rule out esophageal varices.  Rule out MW tear.  *  Coagulopathy, thrombocytopenia and likely ascites on physical exam:  Rule out cirrhosis.  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  No meds for last 3 years.  *  AMS and sister's impression of left facial droop, head Ct negative. tox screen + for cocaine.  Need to inquire with sister as to pt's drug use.   *  Hx of type 2 DM, non med requiring.  By sister's report he eats healthy diet.     PLAN:     *  EGD.  This after coags corrected with FFP.  Called blood bank to confirm emergency release of FFP. *  Check serologies   Azucena Freed  11/08/2013, 9:42 AM Pager: [phone]     ________________________________________________________________________  Velora Heckler GI MD note:  I personally examined the patient, reviewed the data.  I first saw him in endo admitting.  His is in obvious respiratory distress and needs to be intubated.  Rapid response called.  Floor RN reports no overt GI bleeding since he's been admitted.  Unclear underlying issue here, appears enceophalpathic , perhaps septic. WBC very elevated. He will need further stablization prior to any invasive GI testing.  Will check on him again later in ICU.   Owens Loffler, MD [facility] Pager [phone]

## 2013-11-08 NOTE — Procedures (Signed)
Patient had air leaking from mouth. ET tube was seemingly in place and cuff was inflated. Gross leak was heard and witnessed from return volumes on vent.Dr Elsworth Soho called and agreed to have the tube exchanged. Dr Joya Gaskins exchanged the tubes. Cuff pressure on new tube checked and tube positioned at 24cm at the gums. CXR called for.

## 2013-11-08 NOTE — Progress Notes (Signed)
While in endoscopy pt became agitated post intubation so was given MSO4 2 mg and Versed 2.5 mg IV. Had received Propofol for intubation. After arrival to [facility] pt noted with new urticaria extremities, trunk, neck and face. Dr. Thereasa Solo also at bedside - orders for IV Benadryl and Solumedrol. ABG was not obtained due to urgency of intubation. Sedation protocol initiated as well. (Versed/Fentanyl). ETT in good position per post CXR. Sister called and updated on change instatus and transfer to ICU bed.  Erin Hearing, ANP

## 2013-11-09 ENCOUNTER — Inpatient Hospital Stay (HOSPITAL_COMMUNITY): Payer: Medicaid Other

## 2013-11-09 ENCOUNTER — Encounter (HOSPITAL_COMMUNITY): Payer: Self-pay | Admitting: General Surgery

## 2013-11-09 DIAGNOSIS — J95821 Acute postprocedural respiratory failure: Secondary | ICD-10-CM

## 2013-11-09 DIAGNOSIS — D689 Coagulation defect, unspecified: Secondary | ICD-10-CM

## 2013-11-09 DIAGNOSIS — K8020 Calculus of gallbladder without cholecystitis without obstruction: Secondary | ICD-10-CM

## 2013-11-09 DIAGNOSIS — K922 Gastrointestinal hemorrhage, unspecified: Secondary | ICD-10-CM

## 2013-11-09 DIAGNOSIS — J9600 Acute respiratory failure, unspecified whether with hypoxia or hypercapnia: Secondary | ICD-10-CM

## 2013-11-09 DIAGNOSIS — F1410 Cocaine abuse, uncomplicated: Secondary | ICD-10-CM

## 2013-11-09 DIAGNOSIS — E119 Type 2 diabetes mellitus without complications: Secondary | ICD-10-CM

## 2013-11-09 LAB — TYPE AND SCREEN
ABO/RH(D): O POS
Antibody Screen: NEGATIVE
UNIT DIVISION: 0
Unit division: 0

## 2013-11-09 LAB — COMPREHENSIVE METABOLIC PANEL
ALBUMIN: 2.4 g/dL — AB (ref 3.5–5.2)
ALK PHOS: 113 U/L (ref 39–117)
ALT: 58 U/L — ABNORMAL HIGH (ref 0–53)
AST: 56 U/L — ABNORMAL HIGH (ref 0–37)
BUN: 40 mg/dL — AB (ref 6–23)
CHLORIDE: 114 meq/L — AB (ref 96–112)
CO2: 19 mEq/L (ref 19–32)
CREATININE: 1.01 mg/dL (ref 0.50–1.35)
Calcium: 7.7 mg/dL — ABNORMAL LOW (ref 8.4–10.5)
GFR calc non Af Amer: 84 mL/min — ABNORMAL LOW (ref >90)
GLUCOSE: 174 mg/dL — AB (ref 70–99)
POTASSIUM: 3.9 meq/L (ref 3.7–5.3)
Sodium: 144 mEq/L (ref 137–147)
Total Bilirubin: 2.5 mg/dL — ABNORMAL HIGH (ref 0.3–1.2)
Total Protein: 5.7 g/dL — ABNORMAL LOW (ref 6.0–8.3)

## 2013-11-09 LAB — CBC
HCT: 29 % — ABNORMAL LOW (ref 39.0–52.0)
HCT: 25.7 % — ABNORMAL LOW (ref 39.0–52.0)
Hemoglobin: 10.2 g/dL — ABNORMAL LOW (ref 13.0–17.0)
Hemoglobin: 9 g/dL — ABNORMAL LOW (ref 13.0–17.0)
MCH: 33.7 pg (ref 26.0–34.0)
MCH: 33.8 pg (ref 26.0–34.0)
MCHC: 35.2 g/dL (ref 30.0–36.0)
MCHC: 35 g/dL (ref 30.0–36.0)
MCV: 95.7 fL (ref 78.0–100.0)
MCV: 96.6 fL (ref 78.0–100.0)
Platelets: 102 10*3/uL — ABNORMAL LOW (ref 150–400)
Platelets: 53 10*3/uL — ABNORMAL LOW (ref 150–400)
RBC: 3.03 MIL/uL — ABNORMAL LOW (ref 4.22–5.81)
RBC: 2.66 MIL/uL — ABNORMAL LOW (ref 4.22–5.81)
RDW: 18.7 % — AB (ref 11.5–15.5)
RDW: 18.8 % — AB (ref 11.5–15.5)
WBC: 28 10*3/uL — AB (ref 4.0–10.5)
WBC: 11.7 10*3/uL — AB (ref 4.0–10.5)

## 2013-11-09 LAB — PREPARE FRESH FROZEN PLASMA
UNIT DIVISION: 0
Unit division: 0

## 2013-11-09 LAB — HEPATITIS PANEL, ACUTE
HCV Ab: REACTIVE — AB
HEP A IGM: NONREACTIVE
HEP B C IGM: NONREACTIVE
HEP B S AG: NEGATIVE

## 2013-11-09 LAB — URINE CULTURE
COLONY COUNT: NO GROWTH
Culture: NO GROWTH

## 2013-11-09 LAB — GLUCOSE, CAPILLARY
GLUCOSE-CAPILLARY: 109 mg/dL — AB (ref 70–99)
GLUCOSE-CAPILLARY: 127 mg/dL — AB (ref 70–99)
Glucose-Capillary: 157 mg/dL — ABNORMAL HIGH (ref 70–99)
Glucose-Capillary: 169 mg/dL — ABNORMAL HIGH (ref 70–99)
Glucose-Capillary: 95 mg/dL (ref 70–99)

## 2013-11-09 LAB — AMMONIA: AMMONIA: 81 umol/L — AB (ref 11–60)

## 2013-11-09 MED ORDER — CIPROFLOXACIN IN D5W 400 MG/200ML IV SOLN
400.0000 mg | INTRAVENOUS | Status: DC
  Administered 2013-11-09 – 2013-11-10 (×2): 400 mg via INTRAVENOUS
  Filled 2013-11-09 (×3): qty 200

## 2013-11-09 MED ORDER — FENTANYL CITRATE 0.05 MG/ML IJ SOLN
25.0000 ug | INTRAMUSCULAR | Status: DC | PRN
  Administered 2013-11-09: 100 ug via INTRAVENOUS
  Administered 2013-11-09: 50 ug via INTRAVENOUS
  Administered 2013-11-09 – 2013-11-10 (×2): 100 ug via INTRAVENOUS
  Filled 2013-11-09 (×4): qty 2

## 2013-11-09 MED ORDER — CIPROFLOXACIN IN D5W 400 MG/200ML IV SOLN
400.0000 mg | Freq: Two times a day (BID) | INTRAVENOUS | Status: DC
  Filled 2013-11-09: qty 200

## 2013-11-09 MED ORDER — IOHEXOL 300 MG/ML  SOLN
100.0000 mL | Freq: Once | INTRAMUSCULAR | Status: AC | PRN
  Administered 2013-11-09: 100 mL via INTRAVENOUS

## 2013-11-09 NOTE — Progress Notes (Signed)
PULMONARY / CRITICAL CARE MEDICINE  Name: Oscar Swanson MRN: [id number] DOB: [birth year]    ADMISSION DATE:  11/07/2013 CONSULTATION DATE:  11/08/2013  REFERRING MD :  EDP PRIMARY SERVICE:  PCCM  CHIEF COMPLAINT:  Acute GI hemorrhage  BRIEF PATIENT DESCRIPTION: 53 year old male w/ h/o schizophrenia (not on meds) and ETOH, admitted on 1/8 w/ acute upper GI bleed and acute agitated encephalopathy. Agitation was treated w/ lorazepam and Haldol. Prior to ENDO was sedated but stable awaiting EGD. Developed acute respiratory distress in Endo holding awaiting EGD on 1/9, emergently intubated.  SIGNIFICANT EVENTS / STUDIES:  1/8    Admitted w/ acute encephalopathy and UGIB, treated w/ PPI gtt, transfusion and sedation w/ plan for EGD  1/9    Dveloped acute resp distress. Required emergent intubation by anesthesia. Initially intubated esophagus w/ large vol bloody gastric output. Moved to ICU for stabilization.  1/9    EGD Ardis Hughs) >>> There was old blood clot and hematin in the stomach. There were two trunks of medium sized varices, no obvious recent site of bleeding. 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 The blood clot in proximal stomach precluded complete view of the stomach and so perhaps there was offending site underlying. I chose to band ligate the esopahgeal varices (5 bands placed in good position). The examination was otherwise normal.  1/8    CT head: negative  1/10  CT Abdomen >>>  LINES / TUBES: OETT 1/9 >>> Foley 1/9 >>> R [place] CVL 1/9 >>>  CULTURES: 1/2  MRSA PCR >>> neg 1/9 Urine >>> 1/9 Respiratory >>>  ANTIBIOTICS: Unasyn 1/9 >>> Ciprofloxacin  (SBP Px) 1/10  >>>  INTERVAL HISTORY: No acute issues.  VITAL SIGNS: Temp:  [96.8 F (36 C)-99.8 F (37.7 C)] 96.8 F (36 C) (01/10 0739) Pulse Rate:  [53-107] 58 (01/10 0907) Resp:  [12-46] 18 (01/10 0907) BP: (77-141)/(29-76) 97/55 mmHg (01/10 0907) SpO2:  [93 %-100 %] 100 % (01/10 0907) FiO2  (%):  [30 %-100 %] 30 % (01/10 0907) Weight:  [89.8 kg (197 lb 15.6 oz)-90.5 kg (199 lb 8.3 oz)] 90.5 kg (199 lb 8.3 oz) (01/10 0500) HEMODYNAMICS: CVP:  [8 mmHg-16 mmHg] 8 mmHg VENTILATOR SETTINGS: Vent Mode:  [-] PRVC FiO2 (%):  [30 %-100 %] 30 % Set Rate:  [12 bmp-15 bmp] 12 bmp Vt Set:  [500 mL] 500 mL PEEP:  [5 cmH20] 5 cmH20 Plateau Pressure:  [11 cmH20-14 cmH20] 13 cmH20 INTAKE / OUTPUT: Intake/Output     01/09 0701 - 01/10 0700 01/10 0701 - 01/11 0700   I.V. (mL/kg) 4413.5 (48.8) 345 (3.8)   Blood 50    IV Piggyback 1300 100   Total Intake(mL/kg) 5763.5 (63.7) 445 (4.9)   Urine (mL/kg/hr) 1450 (0.7) 150 (0.4)   Total Output 1450 150   Net +4313.5 +295        Stool Occurrence 1 x      PHYSICAL EXAMINATION: General:  Appears acutely ill, mechanically ventilated, synchronous Neuro:  Encephalopathic, nonfocal, cough / gag diminished HEENT:  PERRL, OETT Cardiovascular:  RRR, no m/r/g Lungs:  Bilateral diminished air entry, no w/r/r Abdomen:  Soft, nontender, bowel sounds diminished Musculoskeletal:  Moves all extremities, no edema Skin:  Intact  LABS: CBC  Recent Labs Lab 11/08/13 1829 11/08/13 2200 11/09/13 0441  WBC 12.5* 17.5* 28.0*  HGB 8.7* 9.4* 10.2*  HCT 25.0* 27.4* 29.0*  PLT 53* 62* 102*   Coag's  Recent Labs Lab 11/07/13 1927 11/08/13  1230  APTT  --  31  INR 1.61* 1.66*   BMET  Recent Labs Lab 11/07/13 1927 11/09/13 0441  NA 136* 144  K 4.3 3.9  CL 104 114*  CO2 23 19  BUN 33* 40*  CREATININE 0.96 1.01  GLUCOSE 127* 174*   Electrolytes  Recent Labs Lab 11/07/13 1927 11/09/13 0441  CALCIUM 8.5 7.7*   Sepsis Markers  Recent Labs Lab 11/07/13 1940  LATICACIDVEN 2.58*   ABG  Recent Labs Lab 11/08/13 1407  PHART 7.505*  PCO2ART 32.5*  PO2ART 497.0*   Liver Enzymes  Recent Labs Lab 11/07/13 1927 11/09/13 0441  AST 86* 56*  ALT 78* 58*  ALKPHOS 120* 113  BILITOT 1.3* 2.5*  ALBUMIN 2.6* 2.4*   Cardiac  Enzymes No results found for this basename: TROPONINI, PROBNP,  in the last 168 hours Glucose  Recent Labs Lab 11/08/13 1309 11/08/13 1600 11/08/13 2022 11/08/13 2352 11/09/13 0313 11/09/13 0736  GLUCAP 165* 113* 129* 145* 157* 169*   CXR:  None today.  ASSESSMENT / PLAN:  PULMONARY A:  Acute respiratory failure. Aspiration pneumonia. P:   Goal SpO2>92, pH>7.30 Full mechanical support Daily SBT Ventilator bundle Trend ABG / CXR  CARDIOVASCULAR A:  Shock ( hemorrhagic, septic ) P:  Goal MAP>60 Levophed gtt, titrate to off  RENAL A:   No active issues. P:   Goal CVP>10 Trend BMP NS@100   GASTROINTESTINAL A:  Acute upper GI hemorrhage Non bleeding varices s/p ligation. Clot in proximal stomach, suspected underlying ulcer. Alcoholic hepatitis / elevated transaminases. Alcoholic cirrhosis. Nutrition. P:   GI following NPO TF when OK with GI Protonix  / Octreotide gtt Trend LFT Follow abdominal CT  HEMATOLOGIC A:   Acute blood loss anemia. Coagulopathy. Thrombocytopenia. P:  Trend CBC SCDs Goal Hb>10 while actively bleeding  INFECTIOUS A:   Suspected aspiration pneumonia. SBP Px P:   Cultures and antibiotics as above Add Ciprofloxacin for SBP Px  ENDOCRINE  A:  DM. Hyperglycemia. P:   SSI  NEUROLOGIC A:   Acute encephalopathy. Alcohol abuse. Cocaine abuse. Schizophrenia not on treatment.   P:   Goal RASS 0 to -1 D/c Fentanyl / Versed Prcedex   I have personally obtained history, examined patient, evaluated and interpreted laboratory and imaging results, reviewed medical records, formulated assessment / plan and placed orders.  CRITICAL CARE:  The patient is critically ill with multiple organ systems failure and requires high complexity decision making for assessment and support, frequent evaluation and titration of therapies, application of advanced monitoring technologies and extensive interpretation of multiple databases.  Critical Care Time devoted to patient care services described in this note is 45 minutes.   Doree Fudge, MD Pulmonary and [place] Pager: [phone]  11/09/2013, 10:44 AM

## 2013-11-09 NOTE — Consult Note (Signed)
Reason for Consult:small bowel obstruction, cholelithiasis, leukocytosis  Referring Physician: Dr. Owens Loffler  Oscar Swanson is an 53 y.o. male.  HPI: he has a PMH of alcohol abuse, cirrhosis, schizophrenia, diabetes mellitus who presented to [facility] on 11/07/13 via ambulance with hematemesis.  He was combative and confused in the ED, Ammonia level elevated at 79.  He was hemodynamically stable with stable hemoglobin and hematocrit.  He had several episodes of hematemesis in the ED, guaiac positive. He was positive for cocaine.  He was started on IV Protonix and fluids.  He was admitted and GI was called.  He was given FFP in preparation for EGD.  He developed respiratory distress and was intubated and transferred to ICU.  A CT of head was unremarkable.  His blood pressure also dropped requiring levophed.  He underwent a EGD 11/08/13 with Dr. Ardis Hughs which revealed old blood clot and hematin, with no active bleeding, esophageal varices were ligated and bands were placed.   His white count is increased today to 28K, he is afebrile.  BP stable with levophed.  He remains intubated without sedation.  He is non responsive.  A CT of abdomen raised concern for acute cholecystitis and a closed loop bowel obstruction and therefore we have been asked to evaluate the patient.  Per records he had bowel movements.  He is NPO.  He does not have an NGT due to varices.   Past Medical History  Diagnosis Date  . Diabetes mellitus without complication   . Schizophrenia     onset age 42.  in and out of psych facilities from age 22 to age 43.  s/p electroconvulsive therapy.  . TB (tuberculosis)     sister says this dx'd at age 18 and treated    Past Surgical History  Procedure Laterality Date  . Cataract surgery       Family History  Problem Relation Age of Onset  . Hypertension Mother     Social History:  reports that he has been smoking Cigarettes.  He has a 12.5 pack-year smoking history. He does not have any  smokeless tobacco history on file. He reports that he drinks alcohol. He reports that he does not use illicit drugs.  Allergies: No Known Allergies  Medications:  Prior to Admission medications   Medication Sig Start Date End Date Taking? Authorizing Provider  beclomethasone (QVAR) 80 MCG/ACT inhaler Inhale 1 puff into the lungs 2 (two) times daily.   Yes Historical Provider, MD  naproxen (NAPROSYN) 500 MG tablet Take 1 tablet (500 mg total) by mouth 2 (two) times daily with a meal. 09/23/13  Yes Deepak Advani, MD  nicotine (NICODERM CQ) 21 mg/24hr patch Place 1 patch (21 mg total) onto the skin daily. 09/23/13  Yes Lorayne Marek, MD  GLIPIZIDE PO Take by mouth.    Historical Provider, MD     Results for orders placed during the hospital encounter of 11/07/13 (from the past 48 hour(s))  GLUCOSE, CAPILLARY     Status: Abnormal   Collection Time    11/07/13  6:52 PM      Result Value Range   Glucose-Capillary 117 (*) 70 - 99 mg/dL  TYPE AND SCREEN     Status: None   Collection Time    11/07/13  7:17 PM      Result Value Range   ABO/RH(D) O POS     Antibody Screen NEG     Sample Expiration 11/10/2013     Unit Number [id number]  Blood Component Type RBC LR PHER1     Unit division 00     Status of Unit ISSUED     Transfusion Status OK TO TRANSFUSE     Crossmatch Result Compatible     Unit Number [id number]     Blood Component Type RED CELLS,LR     Unit division 00     Status of Unit ISSUED     Transfusion Status OK TO TRANSFUSE     Crossmatch Result Compatible    ABO/RH     Status: None   Collection Time    11/07/13  7:17 PM      Result Value Range   ABO/RH(D) O POS    CBC WITH DIFFERENTIAL     Status: Abnormal   Collection Time    11/07/13  7:27 PM      Result Value Range   WBC 13.6 (*) 4.0 - 10.5 K/uL   RBC 3.24 (*) 4.22 - 5.81 MIL/uL   Hemoglobin 10.9 (*) 13.0 - 17.0 g/dL   HCT 31.9 (*) 39.0 - 52.0 %   MCV 98.5  78.0 - 100.0 fL   MCH 33.6  26.0 - 34.0 pg    MCHC 34.2  30.0 - 36.0 g/dL   RDW 17.0 (*) 11.5 - 15.5 %   Platelets 84 (*) 150 - 400 K/uL   Comment: PLATELET COUNT CONFIRMED BY SMEAR     REPEATED TO VERIFY     SPECIMEN CHECKED FOR CLOTS   Neutrophils Relative % 67  43 - 77 %   Neutro Abs 9.1 (*) 1.7 - 7.7 K/uL   Lymphocytes Relative 21  12 - 46 %   Lymphs Abs 2.9  0.7 - 4.0 K/uL   Monocytes Relative 12  3 - 12 %   Monocytes Absolute 1.6 (*) 0.1 - 1.0 K/uL   Eosinophils Relative 0  0 - 5 %   Eosinophils Absolute 0.0  0.0 - 0.7 K/uL   Basophils Relative 0  0 - 1 %   Basophils Absolute 0.0  0.0 - 0.1 K/uL  COMPREHENSIVE METABOLIC PANEL     Status: Abnormal   Collection Time    11/07/13  7:27 PM      Result Value Range   Sodium 136 (*) 137 - 147 mEq/L   Potassium 4.3  3.7 - 5.3 mEq/L   Chloride 104  96 - 112 mEq/L   CO2 23  19 - 32 mEq/L   Glucose, Bld 127 (*) 70 - 99 mg/dL   BUN 33 (*) 6 - 23 mg/dL   Creatinine, Ser 0.96  0.50 - 1.35 mg/dL   Calcium 8.5  8.4 - 10.5 mg/dL   Total Protein 6.2  6.0 - 8.3 g/dL   Albumin 2.6 (*) 3.5 - 5.2 g/dL   AST 86 (*) 0 - 37 U/L   ALT 78 (*) 0 - 53 U/L   Alkaline Phosphatase 120 (*) 39 - 117 U/L   Total Bilirubin 1.3 (*) 0.3 - 1.2 mg/dL   GFR calc non Af Amer >90  >90 mL/min   GFR calc Af Amer >90  >90 mL/min   Comment: (NOTE)     The eGFR has been calculated using the CKD EPI equation.     This calculation has not been validated in all clinical situations.     eGFR's persistently <90 mL/min signify possible Chronic Kidney     Disease.  LIPASE, BLOOD     Status: None   Collection Time  11/07/13  7:27 PM      Result Value Range   Lipase 41  11 - 59 U/L  ETHANOL     Status: None   Collection Time    11/07/13  7:27 PM      Result Value Range   Alcohol, Ethyl (B) <11  0 - 11 mg/dL   Comment:            LOWEST DETECTABLE LIMIT FOR     SERUM ALCOHOL IS 11 mg/dL     FOR MEDICAL PURPOSES ONLY  AMMONIA     Status: Abnormal   Collection Time    11/07/13  7:27 PM      Result Value  Range   Ammonia 79 (*) 11 - 60 umol/L  PROTIME-INR     Status: Abnormal   Collection Time    11/07/13  7:27 PM      Result Value Range   Prothrombin Time 18.7 (*) 11.6 - 15.2 seconds   INR 1.61 (*) 0.00 - 1.49  CG4 I-STAT (LACTIC ACID)     Status: Abnormal   Collection Time    11/07/13  7:40 PM      Result Value Range   Lactic Acid, Venous 2.58 (*) 0.5 - 2.2 mmol/L  URINALYSIS, ROUTINE W REFLEX MICROSCOPIC     Status: None   Collection Time    11/07/13  8:20 PM      Result Value Range   Color, Urine YELLOW  YELLOW   APPearance CLEAR  CLEAR   Specific Gravity, Urine 1.025  1.005 - 1.030   pH 7.0  5.0 - 8.0   Glucose, UA NEGATIVE  NEGATIVE mg/dL   Hgb urine dipstick NEGATIVE  NEGATIVE   Bilirubin Urine NEGATIVE  NEGATIVE   Ketones, ur NEGATIVE  NEGATIVE mg/dL   Protein, ur NEGATIVE  NEGATIVE mg/dL   Urobilinogen, UA 0.2  0.0 - 1.0 mg/dL   Nitrite NEGATIVE  NEGATIVE   Leukocytes, UA NEGATIVE  NEGATIVE   Comment: MICROSCOPIC NOT DONE ON URINES WITH NEGATIVE PROTEIN, BLOOD, LEUKOCYTES, NITRITE, OR GLUCOSE <1000 mg/dL.  URINE RAPID DRUG SCREEN (HOSP PERFORMED)     Status: Abnormal   Collection Time    11/07/13  8:20 PM      Result Value Range   Opiates NONE DETECTED  NONE DETECTED   Cocaine POSITIVE (*) NONE DETECTED   Benzodiazepines NONE DETECTED  NONE DETECTED   Amphetamines NONE DETECTED  NONE DETECTED   Tetrahydrocannabinol NONE DETECTED  NONE DETECTED   Barbiturates NONE DETECTED  NONE DETECTED   Comment:            DRUG SCREEN FOR MEDICAL PURPOSES     ONLY.  IF CONFIRMATION IS NEEDED     FOR ANY PURPOSE, NOTIFY LAB     WITHIN 5 DAYS.                LOWEST DETECTABLE LIMITS     FOR URINE DRUG SCREEN     Drug Class       Cutoff (ng/mL)     Amphetamine      1000     Barbiturate      200     Benzodiazepine   606     Tricyclics       301     Opiates          300     Cocaine          300     THC  50  OCCULT BLOOD, POC DEVICE     Status: Abnormal    Collection Time    11/07/13  8:27 PM      Result Value Range   Fecal Occult Bld POSITIVE (*) NEGATIVE  ACETAMINOPHEN LEVEL     Status: None   Collection Time    11/07/13  9:36 PM      Result Value Range   Acetaminophen (Tylenol), Serum <15.0  10 - 30 ug/mL   Comment:            THERAPEUTIC CONCENTRATIONS VARY     SIGNIFICANTLY. A RANGE OF 10-30     ug/mL MAY BE AN EFFECTIVE     CONCENTRATION FOR MANY PATIENTS.     HOWEVER, SOME ARE BEST TREATED     AT CONCENTRATIONS OUTSIDE THIS     RANGE.     ACETAMINOPHEN CONCENTRATIONS     >150 ug/mL AT 4 HOURS AFTER     INGESTION AND >50 ug/mL AT 12     HOURS AFTER INGESTION ARE     OFTEN ASSOCIATED WITH TOXIC     REACTIONS.  MRSA PCR SCREENING     Status: None   Collection Time    11/07/13 11:26 PM      Result Value Range   MRSA by PCR NEGATIVE  NEGATIVE   Comment:            The GeneXpert MRSA Assay (FDA     approved for NASAL specimens     only), is one component of a     comprehensive MRSA colonization     surveillance program. It is not     intended to diagnose MRSA     infection nor to guide or     monitor treatment for     MRSA infections.  GLUCOSE, CAPILLARY     Status: Abnormal   Collection Time    11/08/13 12:20 AM      Result Value Range   Glucose-Capillary 113 (*) 70 - 99 mg/dL  CBC     Status: Abnormal   Collection Time    11/08/13  3:13 AM      Result Value Range   WBC 17.4 (*) 4.0 - 10.5 K/uL   RBC 2.44 (*) 4.22 - 5.81 MIL/uL   Hemoglobin 8.2 (*) 13.0 - 17.0 g/dL   Comment: REPEATED TO VERIFY   HCT 24.1 (*) 39.0 - 52.0 %   MCV 98.8  78.0 - 100.0 fL   MCH 33.6  26.0 - 34.0 pg   MCHC 34.0  30.0 - 36.0 g/dL   RDW 17.0 (*) 11.5 - 15.5 %   Platelets 84 (*) 150 - 400 K/uL   Comment: CONSISTENT WITH PREVIOUS RESULT  GLUCOSE, CAPILLARY     Status: Abnormal   Collection Time    11/08/13  4:35 AM      Result Value Range   Glucose-Capillary 128 (*) 70 - 99 mg/dL  CBC     Status: Abnormal   Collection Time     11/08/13  7:30 AM      Result Value Range   WBC 20.9 (*) 4.0 - 10.5 K/uL   RBC 2.38 (*) 4.22 - 5.81 MIL/uL   Hemoglobin 8.2 (*) 13.0 - 17.0 g/dL   HCT 23.4 (*) 39.0 - 52.0 %   MCV 98.3  78.0 - 100.0 fL   MCH 34.5 (*) 26.0 - 34.0 pg   MCHC 35.0  30.0 - 36.0 g/dL   RDW 17.2 (*)  11.5 - 15.5 %   Platelets 89 (*) 150 - 400 K/uL   Comment: CONSISTENT WITH PREVIOUS RESULT  GLUCOSE, CAPILLARY     Status: Abnormal   Collection Time    11/08/13  8:12 AM      Result Value Range   Glucose-Capillary 140 (*) 70 - 99 mg/dL   Comment 1 Notify RN    PREPARE FRESH FROZEN PLASMA     Status: None   Collection Time    11/08/13 10:00 AM      Result Value Range   Unit Number [id number]     Blood Component Type THAWED PLASMA     Unit division 00     Status of Unit ISSUED     Transfusion Status OK TO TRANSFUSE     Unit Number [id number]     Blood Component Type THAWED PLASMA     Unit division 00     Status of Unit ISSUED     Transfusion Status OK TO TRANSFUSE    PREPARE RBC (CROSSMATCH)     Status: None   Collection Time    11/08/13 11:46 AM      Result Value Range   Order Confirmation ORDER PROCESSED BY BLOOD BANK    CBC     Status: Abnormal   Collection Time    11/08/13 12:30 PM      Result Value Range   WBC 21.4 (*) 4.0 - 10.5 K/uL   RBC 2.35 (*) 4.22 - 5.81 MIL/uL   Hemoglobin 7.9 (*) 13.0 - 17.0 g/dL   HCT 23.3 (*) 39.0 - 52.0 %   MCV 99.1  78.0 - 100.0 fL   MCH 33.6  26.0 - 34.0 pg   MCHC 33.9  30.0 - 36.0 g/dL   RDW 17.4 (*) 11.5 - 15.5 %   Platelets 102 (*) 150 - 400 K/uL   Comment: CONSISTENT WITH PREVIOUS RESULT  APTT     Status: None   Collection Time    11/08/13 12:30 PM      Result Value Range   aPTT 31  24 - 37 seconds  PROTIME-INR     Status: Abnormal   Collection Time    11/08/13 12:30 PM      Result Value Range   Prothrombin Time 19.1 (*) 11.6 - 15.2 seconds   INR 1.66 (*) 0.00 - 1.49  GLUCOSE, CAPILLARY     Status: Abnormal   Collection Time    11/08/13   1:09 PM      Result Value Range   Glucose-Capillary 165 (*) 70 - 99 mg/dL  PREPARE FRESH FROZEN PLASMA     Status: None   Collection Time    11/08/13  1:30 PM      Result Value Range   Unit Number [id number]     Blood Component Type THAWED PLASMA     Unit division 00     Status of Unit REL FROM [facility]     Transfusion Status OK TO TRANSFUSE     Unit Number [id number]     Blood Component Type THAWED PLASMA     Unit division 00     Status of Unit REL FROM [facility]     Transfusion Status OK TO TRANSFUSE    POCT I-STAT 3, BLOOD GAS (G3+)     Status: Abnormal   Collection Time    11/08/13  2:07 PM      Result Value Range   pH, Arterial 7.505 (*) 7.350 - 7.450  pCO2 arterial 32.5 (*) 35.0 - 45.0 mmHg   pO2, Arterial 497.0 (*) 80.0 - 100.0 mmHg   Bicarbonate 25.7 (*) 20.0 - 24.0 mEq/L   TCO2 27  0 - 100 mmol/L   O2 Saturation 100.0     Acid-Base Excess 3.0 (*) 0.0 - 2.0 mmol/L   Patient temperature 98.8 F     Collection site RADIAL, ALLEN'S TEST ACCEPTABLE     Drawn by RT     Sample type ARTERIAL    GLUCOSE, CAPILLARY     Status: Abnormal   Collection Time    11/08/13  4:00 PM      Result Value Range   Glucose-Capillary 113 (*) 70 - 99 mg/dL  CBC     Status: Abnormal   Collection Time    11/08/13  6:29 PM      Result Value Range   WBC 12.5 (*) 4.0 - 10.5 K/uL   RBC 2.62 (*) 4.22 - 5.81 MIL/uL   Hemoglobin 8.7 (*) 13.0 - 17.0 g/dL   HCT 25.0 (*) 39.0 - 52.0 %   MCV 95.4  78.0 - 100.0 fL   MCH 33.2  26.0 - 34.0 pg   MCHC 34.8  30.0 - 36.0 g/dL   RDW 17.7 (*) 11.5 - 15.5 %   Platelets 53 (*) 150 - 400 K/uL   Comment: REPEATED TO VERIFY     SPECIMEN CHECKED FOR CLOTS     CONSISTENT WITH PREVIOUS RESULT     DELTA CHECK NOTED  HEPATITIS PANEL, ACUTE     Status: Abnormal   Collection Time    11/08/13  6:29 PM      Result Value Range   Hepatitis B Surface Ag NEGATIVE  NEGATIVE   HCV Ab Reactive (*) NEGATIVE   Comment: (NOTE)                                                                                This test is for screening purposes only.  Reactive results should be     confirmed by an alternative method.  Suggest HCV Qualitative, PCR,     test code 83130.  Specimens will be stable for reflex testing up to 3     days after collection.   Hep A IgM NON REACTIVE  NON REACTIVE   Hep B C IgM NON REACTIVE  NON REACTIVE   Comment: (NOTE)     High levels of Hepatitis B Core IgM antibody are detectable     during the acute stage of Hepatitis B. This antibody is used     to differentiate current from past HBV infection.     Performed at [place], [facility]     Status: Abnormal   Collection Time    11/08/13  8:22 PM      Result Value Range   Glucose-Capillary 129 (*) 70 - 99 mg/dL  CBC     Status: Abnormal   Collection Time    11/08/13 10:00 PM      Result Value Range   WBC 17.5 (*) 4.0 - 10.5 K/uL   RBC 2.86 (*) 4.22 - 5.81 MIL/uL   Hemoglobin 9.4 (*) 13.0 -  17.0 g/dL   HCT 27.4 (*) 39.0 - 52.0 %   MCV 95.8  78.0 - 100.0 fL   MCH 32.9  26.0 - 34.0 pg   MCHC 34.3  30.0 - 36.0 g/dL   RDW 17.9 (*) 11.5 - 15.5 %   Platelets 62 (*) 150 - 400 K/uL   Comment: CONSISTENT WITH PREVIOUS RESULT  GLUCOSE, CAPILLARY     Status: Abnormal   Collection Time    11/08/13 11:52 PM      Result Value Range   Glucose-Capillary 145 (*) 70 - 99 mg/dL  GLUCOSE, CAPILLARY     Status: Abnormal   Collection Time    11/09/13  3:13 AM      Result Value Range   Glucose-Capillary 157 (*) 70 - 99 mg/dL  COMPREHENSIVE METABOLIC PANEL     Status: Abnormal   Collection Time    11/09/13  4:41 AM      Result Value Range   Sodium 144  137 - 147 mEq/L   Comment: DELTA CHECK NOTED   Potassium 3.9  3.7 - 5.3 mEq/L   Chloride 114 (*) 96 - 112 mEq/L   Comment: DELTA CHECK NOTED   CO2 19  19 - 32 mEq/L   Glucose, Bld 174 (*) 70 - 99 mg/dL   BUN 40 (*) 6 - 23 mg/dL   Creatinine, Ser 1.01  0.50 - 1.35 mg/dL   Calcium 7.7 (*) 8.4 - 10.5 mg/dL   Total Protein  5.7 (*) 6.0 - 8.3 g/dL   Albumin 2.4 (*) 3.5 - 5.2 g/dL   AST 56 (*) 0 - 37 U/L   ALT 58 (*) 0 - 53 U/L   Alkaline Phosphatase 113  39 - 117 U/L   Total Bilirubin 2.5 (*) 0.3 - 1.2 mg/dL   GFR calc non Af Amer 84 (*) >90 mL/min   GFR calc Af Amer >90  >90 mL/min   Comment: (NOTE)     The eGFR has been calculated using the CKD EPI equation.     This calculation has not been validated in all clinical situations.     eGFR's persistently <90 mL/min signify possible Chronic Kidney     Disease.  CBC     Status: Abnormal   Collection Time    11/09/13  4:41 AM      Result Value Range   WBC 28.0 (*) 4.0 - 10.5 K/uL   RBC 3.03 (*) 4.22 - 5.81 MIL/uL   Hemoglobin 10.2 (*) 13.0 - 17.0 g/dL   HCT 29.0 (*) 39.0 - 52.0 %   MCV 95.7  78.0 - 100.0 fL   MCH 33.7  26.0 - 34.0 pg   MCHC 35.2  30.0 - 36.0 g/dL   RDW 18.7 (*) 11.5 - 15.5 %   Platelets 102 (*) 150 - 400 K/uL   Comment: REPEATED TO VERIFY     CONSISTENT WITH PREVIOUS RESULT  AMMONIA     Status: Abnormal   Collection Time    11/09/13  4:45 AM      Result Value Range   Ammonia 81 (*) 11 - 60 umol/L  GLUCOSE, CAPILLARY     Status: Abnormal   Collection Time    11/09/13  7:36 AM      Result Value Range   Glucose-Capillary 169 (*) 70 - 99 mg/dL  GLUCOSE, CAPILLARY     Status: Abnormal   Collection Time    11/09/13 11:54 AM      Result Value  Range   Glucose-Capillary 109 (*) 70 - 99 mg/dL    Ct Head Wo Contrast  11/07/2013   CLINICAL DATA:  Vomiting blood with slurred speech and confusion.  EXAM: CT HEAD WITHOUT CONTRAST  TECHNIQUE: Contiguous axial images were obtained from the base of the skull through the vertex without intravenous contrast.  COMPARISON:  None.  FINDINGS: The patient's head is mildly tilted to the right.  There is no evidence of acute intracranial hemorrhage, mass lesion, brain edema or extra-axial fluid collection. The ventricles and subarachnoid spaces are appropriately sized for age. There is no CT evidence of  acute cortical infarction.  The visualized paranasal sinuses, mastoid air cells and middle ears are clear. The calvarium is intact.  IMPRESSION: Unremarkable noncontrast head CT.   Electronically Signed   By: Camie Patience M.D.   On: 11/07/2013 19:46   Ct Abdomen Pelvis W Contrast  11/09/2013   CLINICAL DATA:  Ventilator dependent respiratory failure. Patient presented with acute upper GI bleeding and had esophageal varices clamped yesterday. Current history of cirrhosis. Possible sepsis.  EXAM: CT ABDOMEN AND PELVIS WITH CONTRAST  TECHNIQUE: Multidetector CT imaging of the abdomen and pelvis was performed using the standard protocol following bolus administration of intravenous contrast.  CONTRAST:  179m OMNIPAQUE IOHEXOL 300 MG/ML IV. Oral contrast was not administered.  COMPARISON:  None.  FINDINGS: Beam hardening streak artifact involving the upper abdomen as the patient was unable to raise the right arm for the examination. Irregular hepatic contour were and relative enlargement of the left lobe and caudate lobe, consistent with the given history of hepatic cirrhosis. No focal hepatic parenchymal masses. Mild splenomegaly, the spleen measuring approximately 8.9 x 10.6 x 14.8 cm, yielding a volume of approximately 700 CC; no focal splenic parenchymal abnormality. Moderate intra-abdominal ascites. Gastroesophageal varices. Patent portal vein and splenic vein.  Cholelithiasis, the gallstones on the order of 5 mm in size. Gallbladder wall thickening/edema. No pericholecystic inflammation. No biliary ductal dilation. Normal-appearing pancreas, adrenal glands, and kidneys. Mild to moderate aortoiliac atherosclerosis without aneurysm. Patent visceral arteries. Multiple normal size and upper normal size lymph nodes scattered throughout the abdomen, the largest in the gastrohepatic ligament measuring approximately 2.9 x 1.1 cm. No nodal masses.  Normal appearing stomach. Mild dilation of a segment of jejunum in the  left upper quadrant, fluid-filled, with normal caliber small bowel proximal and distal to this segment. Remainder of the small bowel normal in appearance. There are small bowel loops positioned lateral to the descending colon in the left mid abdomen, including the dilated loop. Marked thickening/edema involving the wall of the cecum and ascending colon. Remainder of the colon decompressed and unremarkable. No free intraperitoneal air.  Urinary bladder decompressed by Foley catheter, accounting for the gas within the bladder. Prostate gland upper normal in size. Normal seminal vesicles. Phleboliths low in the left side of the pelvis.  Bone window images demonstrate lower thoracic spondylosis and mild degenerative changes involving the lumbar spine. Visualized lung bases clear apart from the expected dependent atelectasis posteriorly. Heart size normal.  IMPRESSION: 1. Hepatic cirrhosis and evidence of portal hypertension with mild splenomegaly, moderate ascites, and gastroesophageal varices. Patent portal vein and splenic vein. 2. Cholelithiasis. Marked gallbladder wall thickening/edema which can be seen in patients with hepatic cirrhosis. Is there clinical evidence of acute cholecystitis? 3. Isolated dilated loop of jejunum in the left upper quadrant. The appearance of this loop (folded upon itself with the small bowel proximal and distal to this loop of  normal caliber) raises the question of a closed loop obstruction. The obstruction may just be due to an adhesion involving both ends of the dilated segment. There is no evidence of wall thickening involving this segment. 4. Small bowel loops in the left upper abdomen position lateral to the descending colon, including the dilated segment. This can occasionally be seen in patients with an internal hernia. 5. Ascending colitis. 6. Shoddy lymphadenopathy in the abdomen. Results were discussed with the GI Medicine attending at the time of interpretation on 11/09/2013 at  1130 hr.   Electronically Signed   By: Evangeline Dakin M.D.   On: 11/09/2013 11:38   Portable Chest Xray In Am  11/09/2013   CLINICAL DATA:  Intubation, ventilatory support  EXAM: PORTABLE CHEST - 1 VIEW  COMPARISON:  11/08/2013  FINDINGS: Endotracheal to 3.8 cm above the carina. Right subclavian central line tip at the SVC RA junction level. Stable heart size with vascular congestion and mild interstitial prominence. No definite edema pattern or pneumonia. No collapse, consolidation, developing effusion or pneumothorax. Overall stable exam.  IMPRESSION: Stable support apparatus.  Mild vascular congestion.   Electronically Signed   By: Daryll Brod M.D.   On: 11/09/2013 08:28   Dg Chest Port 1 View  11/08/2013   CLINICAL DATA:  Endotracheal tube placement  EXAM: PORTABLE CHEST - 1 VIEW  COMPARISON:  Prior film same day  FINDINGS: Cardiomediastinal silhouette is stable. Endotracheal tube in place with tip 2.7 cm above the carina. Stable right subclavian central line position. No acute infiltrate or pulmonary edema. No diagnostic pneumothorax.  IMPRESSION: No active disease. Endotracheal tube in place. No diagnostic pneumothorax.   Electronically Signed   By: Lahoma Crocker M.D.   On: 11/08/2013 17:38   Dg Chest Port 1 View  11/08/2013   CLINICAL DATA:  Evaluate endotracheal tube and central line  EXAM: PORTABLE CHEST - 1 VIEW  COMPARISON:  November 08, 2013 08/1950 a.m.  FINDINGS: Endotracheal tube is noted 6.9 cm from carina. Advancement by 3 cm is recommended. There is no pneumothorax. Right subclavian central venous line is noted with distal tip in the superior vena cava. A nasogastric tube is identified with distal tip not included on film but is at least in the mid stomach. There is diffuse increased pulmonary interstitium bilaterally. There is no pleural effusion. The heart size is normal.  IMPRESSION: Life supporting devices as described. Advancement of the endotracheal 2 x 3 cm is recommended. There is no  pneumothorax. Mild diffuse interstitial edema.   Electronically Signed   By: Abelardo Diesel M.D.   On: 11/08/2013 14:30   Dg Chest Port 1 View  11/08/2013   CLINICAL DATA:  Respiratory failure, intubation  EXAM: PORTABLE CHEST - 1 VIEW  COMPARISON:  Portable exam 1151 hr without priors for comparison  FINDINGS: Tip of endotracheal tube projects 3.5 above carina.  Normal heart size, mediastinal contours and pulmonary vascularity.  Accentuation of interstitial markings in the mid to lower lungs, uncertain acuity.  No segmental consolidation, pleural effusion or pneumothorax.  Question left nipple shadow.  No acute osseous findings.  IMPRESSION: Satisfactory endotracheal tube position.  Minimal accentuation of interstitial markings, of uncertain acuity.  Questionable left nipple shadow; recommend followup upright PA chest radiograph with nipple markers when the patient's condition permits for further evaluation.   Electronically Signed   By: Lavonia Dana M.D.   On: 11/08/2013 13:08    Review of Systems  Unable to perform ROS  Blood  pressure 91/50, pulse 53, temperature 97 F (36.1 C), temperature source Axillary, resp. rate 15, height 5' 8.9" (1.75 m), weight 199 lb 8.3 oz (90.5 kg), SpO2 98.00%. Physical Exam  Constitutional:  On vent, non responsive.   HENT:  Head: Normocephalic and atraumatic.  Non reactive pupils bilaterally, pinpoint  Eyes:  Non reactive pupils bilaterally, pinpoint  Cardiovascular: Normal rate, regular rhythm, normal heart sounds and intact distal pulses.  Exam reveals no gallop and no friction rub.   No murmur heard. Respiratory: Breath sounds normal.  GI: Soft. Bowel sounds are normal. He exhibits no distension and no mass. There is no rebound and no guarding.  Musculoskeletal: He exhibits no edema.  Neurological:  Non responsive to painful stimuli  Skin: Skin is warm and dry. No rash noted. No erythema. No pallor.    Assessment Acute respiratory failure Acute upper  GI bleed Non bleeding esophageal varices Alcoholic cirrhosis Coagulopathy Suspected aspiration pneumonia Diabetes mellitus Acute encephalopathy  Plan: CT of abdomen reveals cholelithiasis, gallbladder thickening which certainly may be secondary his liver disease.  We will obtain a HIDA scan which may or may not be helpful in his case to rule out acute cholecystitis.  As for the bowel obstruction, he has recorded bowel movements, abdomen is soft and non distended.  For now, we will manage his non surgically with serial abdominal exams.  He would be a very high risk surgical candidate. Should surgery be recommended, will discuss with his sister Margot Ables.    Erby Pian 11/09/2013, 12:11 PM

## 2013-11-09 NOTE — Progress Notes (Signed)
Transported pt to ct  With vent on 100%.  Back in room on original settings.

## 2013-11-09 NOTE — Progress Notes (Signed)
[facility] Progress Note    Since last GI note: EGD yesterday with banding of esophageal varices.  Levophed started last night.  No overt GI bleeding.  Objective: Vital signs in last 24 hours: Temp:  [97.8 F (36.6 C)-99.8 F (37.7 C)] 97.8 F (36.6 C) (01/10 0400) Pulse Rate:  [57-107] 57 (01/10 0630) Resp:  [12-46] 17 (01/10 0630) BP: (77-141)/(29-76) 105/63 mmHg (01/10 0630) SpO2:  [93 %-100 %] 100 % (01/10 0630) FiO2 (%):  [40 %-100 %] 40 % (01/10 0401) Weight:  [197 lb 15.6 oz (89.8 kg)-199 lb 8.3 oz (90.5 kg)] 199 lb 8.3 oz (90.5 kg) (01/10 0500) Last BM Date: 11/09/13 General: intubated, sedated Heart: regular rate and rythm Abdomen: soft, non-tender, non-distended, normal bowel sounds   Lab Results:  Recent Labs  11/08/13 1829 11/08/13 2200 11/09/13 0441  WBC 12.5* 17.5* 28.0*  HGB 8.7* 9.4* 10.2*  PLT 53* 62* 102*  MCV 95.4 95.8 95.7    Recent Labs  11/07/13 1927 11/09/13 0441  NA 136* 144  K 4.3 3.9  CL 104 114*  CO2 23 19  GLUCOSE 127* 174*  BUN 33* 40*  CREATININE 0.96 1.01  CALCIUM 8.5 7.7*    Recent Labs  11/07/13 1927 11/09/13 0441  PROT 6.2 5.7*  ALBUMIN 2.6* 2.4*  AST 86* 56*  ALT 78* 58*  ALKPHOS 120* 113  BILITOT 1.3* 2.5*    Recent Labs  11/07/13 1927 11/08/13 1230  INR 1.61* 1.66*    Medications: Scheduled Meds: . ampicillin-sulbactam (UNASYN) IV  3 g Intravenous Q6H  . antiseptic oral rinse  15 mL Mouth Rinse QID  . chlorhexidine  15 mL Mouth Rinse BID  . insulin aspart  0-9 Units Subcutaneous Q4H  . midazolam  2 mg Intravenous Once  . sodium chloride  3 mL Intravenous Q12H  . thiamine  100 mg Intravenous Daily   Continuous Infusions: . sodium chloride 150 mL/hr at 11/09/13 0422  . dexmedetomidine 0.4 mcg/kg/hr (11/09/13 0640)  . norepinephrine (LEVOPHED) Adult infusion 2 mcg/min (11/09/13 0615)  . octreotide (SANDOSTATIN) infusion 50 mcg/hr (11/09/13 0615)  . pantoprozole (PROTONIX) infusion 8  mg/hr (11/09/13 0615)   PRN Meds:.fentaNYL, influenza vac split quadrivalent PF, ondansetron (ZOFRAN) IV, pneumococcal 23 valent vaccine    Assessment/Plan: 53 y.o. male with GI bleed from varices, other acute illness?   This seems to be more than just variceal bleeding going on here.  INR increasing, WBC increasing, required pressor support (low dose albeit);  Liver failure?  T bili is up a bit but transaminases are not seriously elevated as is usually the case with fulm hepatic failure.  I'm suspicious of underlying sepsis.  Came in with cocaine + on tox screen and was seriously encephalopathic.  I think CT scan abd with IV contrast is a good idea (will image liver, checking for cirrhosis which is suspected given his alcohol abuse but I don't believe ever proven).  I will order.  Should continue octreotide gtts, IV PPI.   Milus Banister, MD  11/09/2013, 7:27 AM [facility] Pager [phone]

## 2013-11-09 NOTE — Progress Notes (Signed)
Given pt's h/o GI bleed and s/p banding of esophageal varices, called CCMD to question safety/necessity of the current order to insert OG tube and connect to low intermittent wall suction. Informed to hold off on inserting OG tube for now and reassess in the AM.

## 2013-11-09 NOTE — Progress Notes (Signed)
Upon assessment, prior to repositioning patient, right subclavian central line noted to be leaking around access site. Catheter appeared to be pulled back. IV fluids stopped and transitioned over to peripheral IV access. Dr Dagmar Hait notified. New orders obtained. Central line removed without difficulty per order. Site covered with dry gauze dressing.

## 2013-11-09 NOTE — Progress Notes (Signed)
eLink Physician-Brief Progress Note Patient Name: Oscar Swanson DOB: Apr 27, 1961 MRN: [id number]  Date of Service  11/09/2013   HPI/Events of Note  Encephalopathy with intermittent agitation Low bp   eICU Interventions  Fent prn   Intervention Category Major Interventions: Delirium, psychosis, severe agitation - evaluation and management  Stephnie Parlier V. 11/09/2013, 4:26 PM

## 2013-11-09 NOTE — Progress Notes (Signed)
I discussed CT with radiologist.  Thickened right colon, ? Colitis, perhaps from also thickened edematous GB nearby (cholecystitis?), also LUQ isolated loop of SB (closed loop hernia?).  His encephalopathy, hypotension has seemed out of proportion to his variceal bleeding.  Also WBC rising.    I have called for general surgery consult for their opinion. Obviously his is not a very good surgical candidate given his cirrhosis and current acute illness.

## 2013-11-09 NOTE — Consult Note (Signed)
Abdomen seems remarkably soft - not concerning for cholecystitis or bowel obstruction.  Will evaluate possible SBO by repeating films tomorrow - contrast should be through the SB into the colon by then.  Will obtain HIDA scan to rule out cholecystitis.  Patient is at very high risk for perioperative complications regardless of the procedure - Childs B/D cirrhosis.  Continue abx for now.  We will continue to follow.  Oscar Swanson. Georgette Dover, MD, [facility]  General/ Trauma Surgery  11/09/2013 12:53 PM

## 2013-11-10 ENCOUNTER — Inpatient Hospital Stay (HOSPITAL_COMMUNITY): Payer: Medicaid Other

## 2013-11-10 DIAGNOSIS — K7030 Alcoholic cirrhosis of liver without ascites: Principal | ICD-10-CM

## 2013-11-10 LAB — CBC
HCT: 28.6 % — ABNORMAL LOW (ref 39.0–52.0)
Hemoglobin: 9.9 g/dL — ABNORMAL LOW (ref 13.0–17.0)
MCH: 34.3 pg — ABNORMAL HIGH (ref 26.0–34.0)
MCHC: 34.6 g/dL (ref 30.0–36.0)
MCV: 99 fL (ref 78.0–100.0)
PLATELETS: 53 10*3/uL — AB (ref 150–400)
RBC: 2.89 MIL/uL — ABNORMAL LOW (ref 4.22–5.81)
RDW: 19.4 % — AB (ref 11.5–15.5)
WBC: 11.3 10*3/uL — AB (ref 4.0–10.5)

## 2013-11-10 LAB — COMPREHENSIVE METABOLIC PANEL
ALBUMIN: 2.3 g/dL — AB (ref 3.5–5.2)
ALK PHOS: 97 U/L (ref 39–117)
ALT: 51 U/L (ref 0–53)
AST: 49 U/L — AB (ref 0–37)
BILIRUBIN TOTAL: 1.3 mg/dL — AB (ref 0.3–1.2)
BUN: 38 mg/dL — ABNORMAL HIGH (ref 6–23)
CO2: 21 mEq/L (ref 19–32)
Calcium: 7.6 mg/dL — ABNORMAL LOW (ref 8.4–10.5)
Chloride: 115 mEq/L — ABNORMAL HIGH (ref 96–112)
Creatinine, Ser: 0.96 mg/dL (ref 0.50–1.35)
GFR calc Af Amer: >90 mL/min (ref >90)
GFR calc non Af Amer: >90 mL/min (ref >90)
Glucose, Bld: 116 mg/dL — ABNORMAL HIGH (ref 70–99)
POTASSIUM: 4.1 meq/L (ref 3.7–5.3)
SODIUM: 145 meq/L (ref 137–147)
Total Protein: 5.5 g/dL — ABNORMAL LOW (ref 6.0–8.3)

## 2013-11-10 LAB — GLUCOSE, CAPILLARY
GLUCOSE-CAPILLARY: 106 mg/dL — AB (ref 70–99)
GLUCOSE-CAPILLARY: 94 mg/dL (ref 70–99)
Glucose-Capillary: 129 mg/dL — ABNORMAL HIGH (ref 70–99)
Glucose-Capillary: 88 mg/dL (ref 70–99)
Glucose-Capillary: 105 mg/dL — ABNORMAL HIGH (ref 70–99)

## 2013-11-10 MED ORDER — FENTANYL BOLUS VIA INFUSION
50.0000 ug | INTRAVENOUS | Status: DC | PRN
  Filled 2013-11-10: qty 100

## 2013-11-10 MED ORDER — MIDAZOLAM HCL 2 MG/2ML IJ SOLN
2.0000 mg | INTRAMUSCULAR | Status: DC | PRN
Start: 2013-11-10 — End: 2013-11-10
  Administered 2013-11-10 (×2): 2 mg via INTRAVENOUS
  Filled 2013-11-10: qty 2

## 2013-11-10 MED ORDER — MIDAZOLAM HCL 2 MG/2ML IJ SOLN
INTRAMUSCULAR | Status: AC
  Administered 2013-11-10: 2 mg via INTRAVENOUS
  Filled 2013-11-10: qty 2

## 2013-11-10 MED ORDER — DEXMEDETOMIDINE HCL IN NACL 200 MCG/50ML IV SOLN
0.2000 ug/kg/h | INTRAVENOUS | Status: AC
  Administered 2013-11-10: 0.4 ug/kg/h via INTRAVENOUS
  Administered 2013-11-10: 0.3 ug/kg/h via INTRAVENOUS
  Administered 2013-11-11 (×2): 0.4 ug/kg/h via INTRAVENOUS
  Filled 2013-11-10 (×5): qty 50

## 2013-11-10 MED ORDER — FENTANYL CITRATE 0.05 MG/ML IJ SOLN: 50.0000 ug | Freq: Once | INTRAMUSCULAR | Status: DC

## 2013-11-10 MED ORDER — FENTANYL CITRATE 0.05 MG/ML IJ SOLN
0.0000 ug/h | INTRAMUSCULAR | Status: DC
  Administered 2013-11-10: 100 ug/h via INTRAVENOUS
  Filled 2013-11-10: qty 50

## 2013-11-10 MED ORDER — PANTOPRAZOLE SODIUM 40 MG IV SOLR
40.0000 mg | Freq: Two times a day (BID) | INTRAVENOUS | Status: DC
  Administered 2013-11-10 – 2013-11-12 (×5): 40 mg via INTRAVENOUS
  Filled 2013-11-10 (×5): qty 40

## 2013-11-10 NOTE — Progress Notes (Addendum)
120 ml fentanyl wasted from the recent IV gtt bag .

## 2013-11-10 NOTE — Progress Notes (Signed)
precedex rate increased per Dr Richarda Blade v.o. Marland KitchenRASS score goal changed to -1 to -2 to prevent Pt from pulling out ETT . Dr arrived to assist holding Pt's arms while ETT holder was changed and the tube was advanced to the corrrect 25cm at lip position.

## 2013-11-10 NOTE — Progress Notes (Signed)
Changed tube holder.  Old one was almost off and tube out 1cm.  Advanced tube to 25 at the lips and placed on new tube holder

## 2013-11-10 NOTE — Progress Notes (Signed)
[facility] Progress Note Patient Name: Oscar Swanson DOB: 1961-03-08 MRN: [id number]  Date of Service  11/10/2013   HPI/Events of Note  Patient with severe agitation, attempting to get OOB and concern for dislodging ETT and patient/staff safety.  On prn fentanyl.   eICU Interventions  Plan: 2 mg versed given IVP Start cont sedation with fentanyl PRN versed   Intervention Category Major Interventions: Delirium, psychosis, severe agitation - evaluation and management  Anis Cinelli 11/10/2013, 12:12 AM

## 2013-11-10 NOTE — Consult Note (Signed)
[facility] Progress Note    Since last GI note: CT scan yesterday raised question of cholecystitis, right sided colitis, internal hernia.  Appreciate Dr. Vonna Kotyk input.    Has not had HIDA or repeat films yet.  Very agitated, pulling at things last night.  Objective: Vital signs in last 24 hours: Temp:  [96.8 F (36 C)-99.5 F (37.5 C)] 97.7 F (36.5 C) (01/11 0400) Pulse Rate:  [50-110] 61 (01/11 0600) Resp:  [11-30] 11 (01/11 0600) BP: (87-153)/(43-87) 125/58 mmHg (01/11 0600) SpO2:  [98 %-100 %] 100 % (01/11 0600) FiO2 (%):  [30 %-40 %] 30 % (01/11 0600) Last BM Date: 11/09/13 General: intubated, sedated Heart: regular rate and rythm Abdomen: soft, no guarding, non-distended, normal bowel sounds   Lab Results:  Recent Labs  11/09/13 0441 11/09/13 1415 11/10/13 0308  WBC 28.0* 11.7* 11.3*  HGB 10.2* 9.0* 9.9*  PLT 102* 53* 53*  MCV 95.7 96.6 99.0    Recent Labs  11/07/13 1927 11/09/13 0441 11/10/13 0308  NA 136* 144 145  K 4.3 3.9 4.1  CL 104 114* 115*  CO2 23 19 21   GLUCOSE 127* 174* 116*  BUN 33* 40* 38*  CREATININE 0.96 1.01 0.96  CALCIUM 8.5 7.7* 7.6*    Recent Labs  11/07/13 1927 11/09/13 0441 11/10/13 0308  PROT 6.2 5.7* 5.5*  ALBUMIN 2.6* 2.4* 2.3*  AST 86* 56* 49*  ALT 78* 58* 51  ALKPHOS 120* 113 97  BILITOT 1.3* 2.5* 1.3*    Recent Labs  11/07/13 1927 11/08/13 1230  INR 1.61* 1.66*     Studies/Results: Ct Abdomen Pelvis W Contrast  11/09/2013   CLINICAL DATA:  Ventilator dependent respiratory failure. Patient presented with acute upper GI bleeding and had esophageal varices clamped yesterday. Current history of cirrhosis. Possible sepsis.  EXAM: CT ABDOMEN AND PELVIS WITH CONTRAST  TECHNIQUE: Multidetector CT imaging of the abdomen and pelvis was performed using the standard protocol following bolus administration of intravenous contrast.  CONTRAST:  134mL OMNIPAQUE IOHEXOL 300 MG/ML IV. Oral contrast was not  administered.  COMPARISON:  None.  FINDINGS: Beam hardening streak artifact involving the upper abdomen as the patient was unable to raise the right arm for the examination. Irregular hepatic contour were and relative enlargement of the left lobe and caudate lobe, consistent with the given history of hepatic cirrhosis. No focal hepatic parenchymal masses. Mild splenomegaly, the spleen measuring approximately 8.9 x 10.6 x 14.8 cm, yielding a volume of approximately 700 CC; no focal splenic parenchymal abnormality. Moderate intra-abdominal ascites. Gastroesophageal varices. Patent portal vein and splenic vein.  Cholelithiasis, the gallstones on the order of 5 mm in size. Gallbladder wall thickening/edema. No pericholecystic inflammation. No biliary ductal dilation. Normal-appearing pancreas, adrenal glands, and kidneys. Mild to moderate aortoiliac atherosclerosis without aneurysm. Patent visceral arteries. Multiple normal size and upper normal size lymph nodes scattered throughout the abdomen, the largest in the gastrohepatic ligament measuring approximately 2.9 x 1.1 cm. No nodal masses.  Normal appearing stomach. Mild dilation of a segment of jejunum in the left upper quadrant, fluid-filled, with normal caliber small bowel proximal and distal to this segment. Remainder of the small bowel normal in appearance. There are small bowel loops positioned lateral to the descending colon in the left mid abdomen, including the dilated loop. Marked thickening/edema involving the wall of the cecum and ascending colon. Remainder of the colon decompressed and unremarkable. No free intraperitoneal air.  Urinary bladder decompressed by Foley catheter, accounting for the gas within  the bladder. Prostate gland upper normal in size. Normal seminal vesicles. Phleboliths low in the left side of the pelvis.  Bone window images demonstrate lower thoracic spondylosis and mild degenerative changes involving the lumbar spine. Visualized lung  bases clear apart from the expected dependent atelectasis posteriorly. Heart size normal.  IMPRESSION: 1. Hepatic cirrhosis and evidence of portal hypertension with mild splenomegaly, moderate ascites, and gastroesophageal varices. Patent portal vein and splenic vein. 2. Cholelithiasis. Marked gallbladder wall thickening/edema which can be seen in patients with hepatic cirrhosis. Is there clinical evidence of acute cholecystitis? 3. Isolated dilated loop of jejunum in the left upper quadrant. The appearance of this loop (folded upon itself with the small bowel proximal and distal to this loop of normal caliber) raises the question of a closed loop obstruction. The obstruction may just be due to an adhesion involving both ends of the dilated segment. There is no evidence of wall thickening involving this segment. 4. Small bowel loops in the left upper abdomen position lateral to the descending colon, including the dilated segment. This can occasionally be seen in patients with an internal hernia. 5. Ascending colitis. 6. Shoddy lymphadenopathy in the abdomen. Results were discussed with the GI Medicine attending at the time of interpretation on 11/09/2013 at 1130 hr.   Electronically Signed   By: Evangeline Dakin M.D.   On: 11/09/2013 11:38   Portable Chest Xray In Am  11/09/2013   CLINICAL DATA:  Intubation, ventilatory support  EXAM: PORTABLE CHEST - 1 VIEW  COMPARISON:  11/08/2013  FINDINGS: Endotracheal to 3.8 cm above the carina. Right subclavian central line tip at the SVC RA junction level. Stable heart size with vascular congestion and mild interstitial prominence. No definite edema pattern or pneumonia. No collapse, consolidation, developing effusion or pneumothorax. Overall stable exam.  IMPRESSION: Stable support apparatus.  Mild vascular congestion.   Electronically Signed   By: Daryll Brod M.D.   On: 11/09/2013 08:28   Dg Chest Port 1 View  11/08/2013   CLINICAL DATA:  Endotracheal tube placement   EXAM: PORTABLE CHEST - 1 VIEW  COMPARISON:  Prior film same day  FINDINGS: Cardiomediastinal silhouette is stable. Endotracheal tube in place with tip 2.7 cm above the carina. Stable right subclavian central line position. No acute infiltrate or pulmonary edema. No diagnostic pneumothorax.  IMPRESSION: No active disease. Endotracheal tube in place. No diagnostic pneumothorax.   Electronically Signed   By: Lahoma Crocker M.D.   On: 11/08/2013 17:38   Dg Chest Port 1 View  11/08/2013   CLINICAL DATA:  Evaluate endotracheal tube and central line  EXAM: PORTABLE CHEST - 1 VIEW  COMPARISON:  November 08, 2013 08/1950 a.m.  FINDINGS: Endotracheal tube is noted 6.9 cm from carina. Advancement by 3 cm is recommended. There is no pneumothorax. Right subclavian central venous line is noted with distal tip in the superior vena cava. A nasogastric tube is identified with distal tip not included on film but is at least in the mid stomach. There is diffuse increased pulmonary interstitium bilaterally. There is no pleural effusion. The heart size is normal.  IMPRESSION: Life supporting devices as described. Advancement of the endotracheal 2 x 3 cm is recommended. There is no pneumothorax. Mild diffuse interstitial edema.   Electronically Signed   By: Abelardo Diesel M.D.   On: 11/08/2013 14:30   Dg Chest Port 1 View  11/08/2013   CLINICAL DATA:  Respiratory failure, intubation  EXAM: PORTABLE CHEST - 1 VIEW  COMPARISON:  Portable exam 1151 hr without priors for comparison  FINDINGS: Tip of endotracheal tube projects 3.5 above carina.  Normal heart size, mediastinal contours and pulmonary vascularity.  Accentuation of interstitial markings in the mid to lower lungs, uncertain acuity.  No segmental consolidation, pleural effusion or pneumothorax.  Question left nipple shadow.  No acute osseous findings.  IMPRESSION: Satisfactory endotracheal tube position.  Minimal accentuation of interstitial markings, of uncertain acuity.   Questionable left nipple shadow; recommend followup upright PA chest radiograph with nipple markers when the patient's condition permits for further evaluation.   Electronically Signed   By: Lavonia Dana M.D.   On: 11/08/2013 13:08     Medications: Scheduled Meds: . ampicillin-sulbactam (UNASYN) IV  3 g Intravenous Q6H  . antiseptic oral rinse  15 mL Mouth Rinse QID  . chlorhexidine  15 mL Mouth Rinse BID  . ciprofloxacin  400 mg Intravenous Q24H  . insulin aspart  0-9 Units Subcutaneous Q4H  . sodium chloride  3 mL Intravenous Q12H  . thiamine  100 mg Intravenous Daily   Continuous Infusions: . sodium chloride 100 mL/hr at 11/10/13 1540  . fentaNYL infusion INTRAVENOUS 100 mcg/hr (11/10/13 0039)  . norepinephrine (LEVOPHED) Adult infusion Stopped (11/09/13 1800)  . octreotide (SANDOSTATIN) infusion 50 mcg/hr (11/09/13 1954)  . pantoprozole (PROTONIX) infusion 8 mg/hr (11/10/13 0259)   PRN Meds:.fentaNYL, influenza vac split quadrivalent PF, midazolam, ondansetron (ZOFRAN) IV, pneumococcal 23 valent vaccine    Assessment/Plan: 53 y.o. male with cirrhosis, variceal bleeding  No clear continued GI bleeding,  OK to stop the octreotide gtts and Can d/c protonix drip, beging IV PPI twice daily.  WBC improved.  Needs to be off fentanyl for 6 hours for hida (per nuc med).  I'll leave to CCM to decide other sedation for that test.  Also awaiting AM abd films but he does not appear to have bowel obstruction clinically (abd soft, non-distended).    Milus Banister, MD  11/10/2013, 7:02 AM [facility] Pager [phone]

## 2013-11-10 NOTE — Progress Notes (Addendum)
Patient ID: Oscar Swanson, male   DOB: 06/12/1961, 53 y.o.   MRN: [id number]  Subjective: IV done without oral contrast(pt intubated and NGT unable to be placed due to bands)  Pt on fentanyl gtt and has not and cannot get a HIDA scan until after 6 hours after discontinuation.  WBC slightly improved, No BM yesterday.    Objective:  Vital signs:  Filed Vitals:   11/10/13 0400 11/10/13 0500 11/10/13 0600 11/10/13 0700  BP: 130/75 146/79 125/58 132/68  Pulse: 75 87 61 59  Temp: 97.7 F (36.5 C)     TempSrc: Oral     Resp: _0 Height:      Weight:      SpO2: 100% 100% 100% 100%    Last BM Date: 11/09/13  Intake/Output   Yesterday:  01/10 0701 - 01/11 0700 In: 3883.9 [I.V.:3283.9; IV Piggyback:600] Out: 1600 [Urine:1600] This shift:     Physical Exam: Constitutional: on vent, opens eyes. GI: Soft. Bowel sounds are normal. He exhibits no distension and no mass. There is no rebound and no guarding.   Problem List:   Principal Problem:   Acute respiratory failure Active Problems:   ? Diabetes   Smoking   Hepatic encephalopathy   Schizophrenia   Acute upper GI bleed   Dehydration   Tachycardia   Leukocytosis   Cirrhosis with alcoholism   Mild Coagulopathy   Cocaine abuse   Thrombocytopenia   Esophageal varices with bleeding    Results:   Labs: Results for orders placed during the hospital encounter of 11/07/13 (from the past 48 hour(s))  GLUCOSE, CAPILLARY     Status: Abnormal   Collection Time    11/08/13  8:12 AM      Result Value Range   Glucose-Capillary 140 (*) 70 - 99 mg/dL   Comment 1 Notify RN    PREPARE FRESH FROZEN PLASMA     Status: None   Collection Time    11/08/13 10:00 AM      Result Value Range   Unit Number [id number]     Blood Component Type THAWED PLASMA     Unit division 00     Status of Unit ISSUED,FINAL     Transfusion Status OK TO TRANSFUSE     Unit Number [id number]     Blood Component Type THAWED PLASMA      Unit division 00     Status of Unit ISSUED,FINAL     Transfusion Status OK TO TRANSFUSE    PREPARE RBC (CROSSMATCH)     Status: None   Collection Time    11/08/13 11:46 AM      Result Value Range   Order Confirmation ORDER PROCESSED BY BLOOD BANK    CBC     Status: Abnormal   Collection Time    11/08/13 12:30 PM      Result Value Range   WBC 21.4 (*) 4.0 - 10.5 K/uL   RBC 2.35 (*) 4.22 - 5.81 MIL/uL   Hemoglobin 7.9 (*) 13.0 - 17.0 g/dL   HCT 23.3 (*) 39.0 - 52.0 %   MCV 99.1  78.0 - 100.0 fL   MCH 33.6  26.0 - 34.0 pg   MCHC 33.9  30.0 - 36.0 g/dL   RDW 17.4 (*) 11.5 - 15.5 %   Platelets 102 (*) 150 - 400 K/uL   Comment: CONSISTENT WITH PREVIOUS RESULT  APTT     Status: None   Collection Time  11/08/13 12:30 PM      Result Value Range   aPTT 31  24 - 37 seconds  PROTIME-INR     Status: Abnormal   Collection Time    11/08/13 12:30 PM      Result Value Range   Prothrombin Time 19.1 (*) 11.6 - 15.2 seconds   INR 1.66 (*) 0.00 - 1.49  GLUCOSE, CAPILLARY     Status: Abnormal   Collection Time    11/08/13  1:09 PM      Result Value Range   Glucose-Capillary 165 (*) 70 - 99 mg/dL  PREPARE FRESH FROZEN PLASMA     Status: None   Collection Time    11/08/13  1:30 PM      Result Value Range   Unit Number [id number]     Blood Component Type THAWED PLASMA     Unit division 00     Status of Unit REL FROM [facility]     Transfusion Status OK TO TRANSFUSE     Unit Number [id number]     Blood Component Type THAWED PLASMA     Unit division 00     Status of Unit REL FROM [facility]     Transfusion Status OK TO TRANSFUSE    POCT I-STAT 3, BLOOD GAS (G3+)     Status: Abnormal   Collection Time    11/08/13  2:07 PM      Result Value Range   pH, Arterial 7.505 (*) 7.350 - 7.450   pCO2 arterial 32.5 (*) 35.0 - 45.0 mmHg   pO2, Arterial 497.0 (*) 80.0 - 100.0 mmHg   Bicarbonate 25.7 (*) 20.0 - 24.0 mEq/L   TCO2 27  0 - 100 mmol/L   O2 Saturation 100.0     Acid-Base Excess 3.0  (*) 0.0 - 2.0 mmol/L   Patient temperature 98.8 F     Collection site RADIAL, ALLEN'S TEST ACCEPTABLE     Drawn by RT     Sample type ARTERIAL    GLUCOSE, CAPILLARY     Status: Abnormal   Collection Time    11/08/13  4:00 PM      Result Value Range   Glucose-Capillary 113 (*) 70 - 99 mg/dL  CULTURE, RESPIRATORY (NON-EXPECTORATED)     Status: None   Collection Time    11/08/13  5:41 PM      Result Value Range   Specimen Description TRACHEAL ASPIRATE     Special Requests NONE     Gram Stain       Value: MODERATE WBC PRESENT,BOTH PMN AND MONONUCLEAR     NO SQUAMOUS EPITHELIAL CELLS SEEN     ABUNDANT GRAM NEGATIVE COCCI     MODERATE GRAM POSITIVE COCCI IN PAIRS     RARE GRAM NEGATIVE RODS   Culture PENDING     Report Status PENDING    CBC     Status: Abnormal   Collection Time    11/08/13  6:29 PM      Result Value Range   WBC 12.5 (*) 4.0 - 10.5 K/uL   RBC 2.62 (*) 4.22 - 5.81 MIL/uL   Hemoglobin 8.7 (*) 13.0 - 17.0 g/dL   HCT 25.0 (*) 39.0 - 52.0 %   MCV 95.4  78.0 - 100.0 fL   MCH 33.2  26.0 - 34.0 pg   MCHC 34.8  30.0 - 36.0 g/dL   RDW 17.7 (*) 11.5 - 15.5 %   Platelets 53 (*) 150 - 400 K/uL  Comment: REPEATED TO VERIFY     SPECIMEN CHECKED FOR CLOTS     CONSISTENT WITH PREVIOUS RESULT     DELTA CHECK NOTED  HEPATITIS PANEL, ACUTE     Status: Abnormal   Collection Time    11/08/13  6:29 PM      Result Value Range   Hepatitis B Surface Ag NEGATIVE  NEGATIVE   HCV Ab Reactive (*) NEGATIVE   Comment: (NOTE)                                                                               This test is for screening purposes only.  Reactive results should be     confirmed by an alternative method.  Suggest HCV Qualitative, PCR,     test code 83130.  Specimens will be stable for reflex testing up to 3     days after collection.   Hep A IgM NON REACTIVE  NON REACTIVE   Hep B C IgM NON REACTIVE  NON REACTIVE   Comment: (NOTE)     High levels of Hepatitis B Core IgM  antibody are detectable     during the acute stage of Hepatitis B. This antibody is used     to differentiate current from past HBV infection.     Performed at [facility], CAPILLARY     Status: Abnormal   Collection Time    11/08/13  8:22 PM      Result Value Range   Glucose-Capillary 129 (*) 70 - 99 mg/dL  CBC     Status: Abnormal   Collection Time    11/08/13 10:00 PM      Result Value Range   WBC 17.5 (*) 4.0 - 10.5 K/uL   RBC 2.86 (*) 4.22 - 5.81 MIL/uL   Hemoglobin 9.4 (*) 13.0 - 17.0 g/dL   HCT 27.4 (*) 39.0 - 52.0 %   MCV 95.8  78.0 - 100.0 fL   MCH 32.9  26.0 - 34.0 pg   MCHC 34.3  30.0 - 36.0 g/dL   RDW 17.9 (*) 11.5 - 15.5 %   Platelets 62 (*) 150 - 400 K/uL   Comment: CONSISTENT WITH PREVIOUS RESULT  GLUCOSE, CAPILLARY     Status: Abnormal   Collection Time    11/08/13 11:52 PM      Result Value Range   Glucose-Capillary 145 (*) 70 - 99 mg/dL  GLUCOSE, CAPILLARY     Status: Abnormal   Collection Time    11/09/13  3:13 AM      Result Value Range   Glucose-Capillary 157 (*) 70 - 99 mg/dL  COMPREHENSIVE METABOLIC PANEL     Status: Abnormal   Collection Time    11/09/13  4:41 AM      Result Value Range   Sodium 144  137 - 147 mEq/L   Comment: DELTA CHECK NOTED   Potassium 3.9  3.7 - 5.3 mEq/L   Chloride 114 (*) 96 - 112 mEq/L   Comment: DELTA CHECK NOTED   CO2 19  19 - 32 mEq/L   Glucose, Bld 174 (*) 70 - 99 mg/dL   BUN 40 (*) 6 - 23 mg/dL  Creatinine, Ser 1.01  0.50 - 1.35 mg/dL   Calcium 7.7 (*) 8.4 - 10.5 mg/dL   Total Protein 5.7 (*) 6.0 - 8.3 g/dL   Albumin 2.4 (*) 3.5 - 5.2 g/dL   AST 56 (*) 0 - 37 U/L   ALT 58 (*) 0 - 53 U/L   Alkaline Phosphatase 113  39 - 117 U/L   Total Bilirubin 2.5 (*) 0.3 - 1.2 mg/dL   GFR calc non Af Amer 84 (*) >90 mL/min   GFR calc Af Amer >90  >90 mL/min   Comment: (NOTE)     The eGFR has been calculated using the CKD EPI equation.     This calculation has not been validated in all clinical  situations.     eGFR's persistently <90 mL/min signify possible Chronic Kidney     Disease.  CBC     Status: Abnormal   Collection Time    11/09/13  4:41 AM      Result Value Range   WBC 28.0 (*) 4.0 - 10.5 K/uL   RBC 3.03 (*) 4.22 - 5.81 MIL/uL   Hemoglobin 10.2 (*) 13.0 - 17.0 g/dL   HCT 29.0 (*) 39.0 - 52.0 %   MCV 95.7  78.0 - 100.0 fL   MCH 33.7  26.0 - 34.0 pg   MCHC 35.2  30.0 - 36.0 g/dL   RDW 18.7 (*) 11.5 - 15.5 %   Platelets 102 (*) 150 - 400 K/uL   Comment: REPEATED TO VERIFY     CONSISTENT WITH PREVIOUS RESULT  AMMONIA     Status: Abnormal   Collection Time    11/09/13  4:45 AM      Result Value Range   Ammonia 81 (*) 11 - 60 umol/L  GLUCOSE, CAPILLARY     Status: Abnormal   Collection Time    11/09/13  7:36 AM      Result Value Range   Glucose-Capillary 169 (*) 70 - 99 mg/dL  GLUCOSE, CAPILLARY     Status: Abnormal   Collection Time    11/09/13 11:54 AM      Result Value Range   Glucose-Capillary 109 (*) 70 - 99 mg/dL  CBC     Status: Abnormal   Collection Time    11/09/13  2:15 PM      Result Value Range   WBC 11.7 (*) 4.0 - 10.5 K/uL   RBC 2.66 (*) 4.22 - 5.81 MIL/uL   Hemoglobin 9.0 (*) 13.0 - 17.0 g/dL   HCT 25.7 (*) 39.0 - 52.0 %   MCV 96.6  78.0 - 100.0 fL   MCH 33.8  26.0 - 34.0 pg   MCHC 35.0  30.0 - 36.0 g/dL   RDW 18.8 (*) 11.5 - 15.5 %   Platelets 53 (*) 150 - 400 K/uL   Comment: PLATELET COUNT CONFIRMED BY SMEAR     SPECIMEN CHECKED FOR CLOTS  GLUCOSE, CAPILLARY     Status: Abnormal   Collection Time    11/09/13  3:07 PM      Result Value Range   Glucose-Capillary 127 (*) 70 - 99 mg/dL  GLUCOSE, CAPILLARY     Status: None   Collection Time    11/09/13  8:07 PM      Result Value Range   Glucose-Capillary 95  70 - 99 mg/dL  GLUCOSE, CAPILLARY     Status: Abnormal   Collection Time    11/09/13 11:57 PM      Result Value  Range   Glucose-Capillary 106 (*) 70 - 99 mg/dL  CBC     Status: Abnormal   Collection Time    11/10/13  3:08  AM      Result Value Range   WBC 11.3 (*) 4.0 - 10.5 K/uL   RBC 2.89 (*) 4.22 - 5.81 MIL/uL   Hemoglobin 9.9 (*) 13.0 - 17.0 g/dL   HCT 28.6 (*) 39.0 - 52.0 %   MCV 99.0  78.0 - 100.0 fL   MCH 34.3 (*) 26.0 - 34.0 pg   MCHC 34.6  30.0 - 36.0 g/dL   RDW 19.4 (*) 11.5 - 15.5 %   Platelets 53 (*) 150 - 400 K/uL   Comment: CONSISTENT WITH PREVIOUS RESULT  COMPREHENSIVE METABOLIC PANEL     Status: Abnormal   Collection Time    11/10/13  3:08 AM      Result Value Range   Sodium 145  137 - 147 mEq/L   Potassium 4.1  3.7 - 5.3 mEq/L   Chloride 115 (*) 96 - 112 mEq/L   CO2 21  19 - 32 mEq/L   Glucose, Bld 116 (*) 70 - 99 mg/dL   BUN 38 (*) 6 - 23 mg/dL   Creatinine, Ser 0.96  0.50 - 1.35 mg/dL   Calcium 7.6 (*) 8.4 - 10.5 mg/dL   Total Protein 5.5 (*) 6.0 - 8.3 g/dL   Albumin 2.3 (*) 3.5 - 5.2 g/dL   AST 49 (*) 0 - 37 U/L   ALT 51  0 - 53 U/L   Alkaline Phosphatase 97  39 - 117 U/L   Total Bilirubin 1.3 (*) 0.3 - 1.2 mg/dL   GFR calc non Af Amer >90  >90 mL/min   GFR calc Af Amer >90  >90 mL/min   Comment: (NOTE)     The eGFR has been calculated using the CKD EPI equation.     This calculation has not been validated in all clinical situations.     eGFR's persistently <90 mL/min signify possible Chronic Kidney     Disease.  GLUCOSE, CAPILLARY     Status: Abnormal   Collection Time    11/10/13  4:08 AM      Result Value Range   Glucose-Capillary 129 (*) 70 - 99 mg/dL    Imaging / Studies: Ct Abdomen Pelvis W Contrast  11/09/2013   CLINICAL DATA:  Ventilator dependent respiratory failure. Patient presented with acute upper GI bleeding and had esophageal varices clamped yesterday. Current history of cirrhosis. Possible sepsis.  EXAM: CT ABDOMEN AND PELVIS WITH CONTRAST  TECHNIQUE: Multidetector CT imaging of the abdomen and pelvis was performed using the standard protocol following bolus administration of intravenous contrast.  CONTRAST:  166m OMNIPAQUE IOHEXOL 300 MG/ML IV. Oral  contrast was not administered.  COMPARISON:  None.  FINDINGS: Beam hardening streak artifact involving the upper abdomen as the patient was unable to raise the right arm for the examination. Irregular hepatic contour were and relative enlargement of the left lobe and caudate lobe, consistent with the given history of hepatic cirrhosis. No focal hepatic parenchymal masses. Mild splenomegaly, the spleen measuring approximately 8.9 x 10.6 x 14.8 cm, yielding a volume of approximately 700 CC; no focal splenic parenchymal abnormality. Moderate intra-abdominal ascites. Gastroesophageal varices. Patent portal vein and splenic vein.  Cholelithiasis, the gallstones on the order of 5 mm in size. Gallbladder wall thickening/edema. No pericholecystic inflammation. No biliary ductal dilation. Normal-appearing pancreas, adrenal glands, and kidneys. Mild to moderate aortoiliac  atherosclerosis without aneurysm. Patent visceral arteries. Multiple normal size and upper normal size lymph nodes scattered throughout the abdomen, the largest in the gastrohepatic ligament measuring approximately 2.9 x 1.1 cm. No nodal masses.  Normal appearing stomach. Mild dilation of a segment of jejunum in the left upper quadrant, fluid-filled, with normal caliber small bowel proximal and distal to this segment. Remainder of the small bowel normal in appearance. There are small bowel loops positioned lateral to the descending colon in the left mid abdomen, including the dilated loop. Marked thickening/edema involving the wall of the cecum and ascending colon. Remainder of the colon decompressed and unremarkable. No free intraperitoneal air.  Urinary bladder decompressed by Foley catheter, accounting for the gas within the bladder. Prostate gland upper normal in size. Normal seminal vesicles. Phleboliths low in the left side of the pelvis.  Bone window images demonstrate lower thoracic spondylosis and mild degenerative changes involving the lumbar  spine. Visualized lung bases clear apart from the expected dependent atelectasis posteriorly. Heart size normal.  IMPRESSION: 1. Hepatic cirrhosis and evidence of portal hypertension with mild splenomegaly, moderate ascites, and gastroesophageal varices. Patent portal vein and splenic vein. 2. Cholelithiasis. Marked gallbladder wall thickening/edema which can be seen in patients with hepatic cirrhosis. Is there clinical evidence of acute cholecystitis? 3. Isolated dilated loop of jejunum in the left upper quadrant. The appearance of this loop (folded upon itself with the small bowel proximal and distal to this loop of normal caliber) raises the question of a closed loop obstruction. The obstruction may just be due to an adhesion involving both ends of the dilated segment. There is no evidence of wall thickening involving this segment. 4. Small bowel loops in the left upper abdomen position lateral to the descending colon, including the dilated segment. This can occasionally be seen in patients with an internal hernia. 5. Ascending colitis. 6. Shoddy lymphadenopathy in the abdomen. Results were discussed with the GI Medicine attending at the time of interpretation on 11/09/2013 at 1130 hr.   Electronically Signed   By: Evangeline Dakin M.D.   On: 11/09/2013 11:38   Portable Chest Xray In Am  11/09/2013   CLINICAL DATA:  Intubation, ventilatory support  EXAM: PORTABLE CHEST - 1 VIEW  COMPARISON:  11/08/2013  FINDINGS: Endotracheal to 3.8 cm above the carina. Right subclavian central line tip at the SVC RA junction level. Stable heart size with vascular congestion and mild interstitial prominence. No definite edema pattern or pneumonia. No collapse, consolidation, developing effusion or pneumothorax. Overall stable exam.  IMPRESSION: Stable support apparatus.  Mild vascular congestion.   Electronically Signed   By: Daryll Brod M.D.   On: 11/09/2013 08:28   Dg Chest Port 1 View  11/08/2013   CLINICAL DATA:   Endotracheal tube placement  EXAM: PORTABLE CHEST - 1 VIEW  COMPARISON:  Prior film same day  FINDINGS: Cardiomediastinal silhouette is stable. Endotracheal tube in place with tip 2.7 cm above the carina. Stable right subclavian central line position. No acute infiltrate or pulmonary edema. No diagnostic pneumothorax.  IMPRESSION: No active disease. Endotracheal tube in place. No diagnostic pneumothorax.   Electronically Signed   By: Lahoma Crocker M.D.   On: 11/08/2013 17:38   Dg Chest Port 1 View  11/08/2013   CLINICAL DATA:  Evaluate endotracheal tube and central line  EXAM: PORTABLE CHEST - 1 VIEW  COMPARISON:  November 08, 2013 08/1950 a.m.  FINDINGS: Endotracheal tube is noted 6.9 cm from carina. Advancement by 3 cm  is recommended. There is no pneumothorax. Right subclavian central venous line is noted with distal tip in the superior vena cava. A nasogastric tube is identified with distal tip not included on film but is at least in the mid stomach. There is diffuse increased pulmonary interstitium bilaterally. There is no pleural effusion. The heart size is normal.  IMPRESSION: Life supporting devices as described. Advancement of the endotracheal 2 x 3 cm is recommended. There is no pneumothorax. Mild diffuse interstitial edema.   Electronically Signed   By: Abelardo Diesel M.D.   On: 11/08/2013 14:30   Dg Chest Port 1 View  11/08/2013   CLINICAL DATA:  Respiratory failure, intubation  EXAM: PORTABLE CHEST - 1 VIEW  COMPARISON:  Portable exam 1151 hr without priors for comparison  FINDINGS: Tip of endotracheal tube projects 3.5 above carina.  Normal heart size, mediastinal contours and pulmonary vascularity.  Accentuation of interstitial markings in the mid to lower lungs, uncertain acuity.  No segmental consolidation, pleural effusion or pneumothorax.  Question left nipple shadow.  No acute osseous findings.  IMPRESSION: Satisfactory endotracheal tube position.  Minimal accentuation of interstitial markings, of  uncertain acuity.  Questionable left nipple shadow; recommend followup upright PA chest radiograph with nipple markers when the patient's condition permits for further evaluation.   Electronically Signed   By: Lavonia Dana M.D.   On: 11/08/2013 13:08    Medications / Allergies: per chart  Antibiotics: Anti-infectives   Start     Dose/Rate Route Frequency Ordered Stop   11/09/13 1200  ciprofloxacin (CIPRO) IVPB 400 mg  Status:  Discontinued     400 mg 200 mL/hr over 60 Minutes Intravenous Every 12 hours 11/09/13 1109 11/09/13 1125   11/09/13 1200  ciprofloxacin (CIPRO) IVPB 400 mg     400 mg 200 mL/hr over 60 Minutes Intravenous Every 24 hours 11/09/13 1125     11/08/13 1500  Ampicillin-Sulbactam (UNASYN) 3 g in sodium chloride 0.9 % 100 mL IVPB     3 g 100 mL/hr over 60 Minutes Intravenous Every 6 hours 11/08/13 1349       Assessment Acute respiratory failure  Acute upper GI bleed  Non bleeding esophageal varices  Alcoholic cirrhosis  Coagulopathy  Suspected aspiration pneumonia  Diabetes mellitus  Acute encephalopathy Leukocytosis Cholelithiasis   Plan Would appreciate changing Fentanyl to a non narcotic sedative in order for [place] to obtain a HIDA scan to either confirm or rule out acute cholecystitis.  The CT scan is without oral contrast.  The abdominal exam remains unchanged.  We will continue to monitor clinically.  Await HIDA results.  Erby Pian, [facility] Pager [phone] Office [phone]  11/10/2013  l

## 2013-11-10 NOTE — Progress Notes (Signed)
Patient does not appear to be a surgical candidate.  HIDA in the AM.  If concerned about acute cholecystitis, he should probably get a percutaneous drain.  Kathryne Eriksson. Dahlia Bailiff, MD, [place] [phone] [facility]

## 2013-11-10 NOTE — Progress Notes (Signed)
PULMONARY / CRITICAL CARE MEDICINE  Name: Oscar Swanson MRN: [id number] DOB: 1961-07-03    ADMISSION DATE:  11/07/2013 CONSULTATION DATE:  11/08/2013  REFERRING MD :  EDP PRIMARY SERVICE:  PCCM  CHIEF COMPLAINT:  Acute GI hemorrhage  BRIEF PATIENT DESCRIPTION: 53 year old male w/ h/o schizophrenia (not on meds) and ETOH, admitted on 1/8 w/ acute upper GI bleed and acute agitated encephalopathy. Agitation was treated w/ lorazepam and Haldol. Prior to ENDO was sedated but stable awaiting EGD. Developed acute respiratory distress in Endo holding awaiting EGD on 1/9, emergently intubated.  SIGNIFICANT EVENTS / STUDIES:  1/8    Admitted w/ acute encephalopathy and UGIB, treated w/ PPI gtt, transfusion and sedation w/ plan for EGD  1/9    Dveloped acute resp distress. Required emergent intubation by anesthesia. Initially intubated esophagus w/ large vol bloody gastric output. Moved to ICU for stabilization.  1/9    EGD Ardis Hughs) >>> There was old blood clot and hematin in the stomach. There were two trunks of medium sized varices, no obvious recent site of bleeding. 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 The blood clot in proximal stomach precluded complete view of the stomach and so perhaps there was offending site underlying. I chose to band ligate the esopahgeal varices (5 bands placed in good position). The examination was otherwise normal.  1/8    CT head >>> negative  1/10  CT Abdomen >>>  Hepatic cirrhosis and evidence of portal hypertension with mild splenomegaly, moderate ascites, and gastroesophageal varices. Patent portal vein and splenic vein. Cholelithiasis. Marked gallbladder wall thickening/edema which can be seen in patients with hepatic cirrhosis. Is there clinical  evidence of acute cholecystitis? Isolated dilated loop of jejunum in the left upper quadrant. The appearance of this loop (folded upon itself with the small bowel proximal and distal to this loop of  normal caliber) raises the  question of a closed loop obstruction. The obstruction may just be due to an adhesion involving both ends of the dilated segment. There is no evidence of wall thickening involving this segment. Small bowel loops in the left upper abdomen position lateral to the descending colon, including the dilated segment. This can occasionally be seen in patients with an internal hernia. Ascending colitis. Shoddy lymphadenopathy in the abdomen.   LINES / TUBES: OETT 1/9 >>> Foley 1/9 >>> R Peridot CVL 1/9 >>> 1/10  CULTURES: 1/2  MRSA PCR >>> neg 1/9 Urine >>> neg 1/9 Respiratory >>>  ANTIBIOTICS: Unasyn 1/9 >>> Ciprofloxacin  (SBP Px) 1/10  >>>  INTERVAL HISTORY: Sedation restarted due to agitation. Pulled CVC out. Off Levophed.  VITAL SIGNS: Temp:  [97 F (36.1 C)-99.5 F (37.5 C)] 97.7 F (36.5 C) (01/11 0400) Pulse Rate:  [50-110] 67 (01/11 0900) Resp:  [11-30] 11 (01/11 0900) BP: (87-153)/(43-87) 134/64 mmHg (01/11 0900) SpO2:  [98 %-100 %] 100 % (01/11 0900) FiO2 (%):  [30 %] 30 % (01/11 0820)  HEMODYNAMICS: CVP:  [7 mmHg-10 mmHg] 10 mmHg  VENTILATOR SETTINGS: Vent Mode:  [-] PRVC FiO2 (%):  [30 %] 30 % Set Rate:  [12 bmp] 12 bmp Vt Set:  [500 mL] 500 mL PEEP:  [5 cmH20] 5 cmH20 Plateau Pressure:  [6 cmH20-21 cmH20] 21 cmH20  INTAKE / OUTPUT: Intake/Output     01/10 0701 - 01/11 0700 01/11 0701 - 01/12 0700   I.V. (mL/kg) 3393.9 (37.5) 220 (2.4)   Blood     IV Piggyback 600  Total Intake(mL/kg) 3993.9 (44.1) 220 (2.4)   Urine (mL/kg/hr) 1600 (0.7) 280 (0.9)   Total Output 1600 280   Net +2393.9 -60          PHYSICAL EXAMINATION: General:  Appears acutely ill, mechanically ventilated, synchronous Neuro:  Well sedated, grimaces to stimulation  HEENT:  PERRL, OETT Cardiovascular:  RRR, no m/r/g Lungs:  Bilateral diminished air entry, no w/r/r Abdomen:  Soft, nontender, bowel sounds diminished Musculoskeletal:  No edema Skin:   Intact  LABS: CBC  Recent Labs Lab 11/09/13 0441 11/09/13 1415 11/10/13 0308  WBC 28.0* 11.7* 11.3*  HGB 10.2* 9.0* 9.9*  HCT 29.0* 25.7* 28.6*  PLT 102* 53* 53*   Coag's  Recent Labs Lab 11/07/13 1927 11/08/13 1230  APTT  --  31  INR 1.61* 1.66*   BMET  Recent Labs Lab 11/07/13 1927 11/09/13 0441 11/10/13 0308  NA 136* 144 145  K 4.3 3.9 4.1  CL 104 114* 115*  CO2 23 19 21   BUN 33* 40* 38*  CREATININE 0.96 1.01 0.96  GLUCOSE 127* 174* 116*   Electrolytes  Recent Labs Lab 11/07/13 1927 11/09/13 0441 11/10/13 0308  CALCIUM 8.5 7.7* 7.6*   Sepsis Markers  Recent Labs Lab 11/07/13 1940  LATICACIDVEN 2.58*   ABG  Recent Labs Lab 11/08/13 1407  PHART 7.505*  PCO2ART 32.5*  PO2ART 497.0*   Liver Enzymes  Recent Labs Lab 11/07/13 1927 11/09/13 0441 11/10/13 0308  AST 86* 56* 49*  ALT 78* 58* 51  ALKPHOS 120* 113 97  BILITOT 1.3* 2.5* 1.3*  ALBUMIN 2.6* 2.4* 2.3*   Cardiac Enzymes No results found for this basename: TROPONINI, PROBNP,  in the last 168 hours Glucose  Recent Labs Lab 11/09/13 1154 11/09/13 1507 11/09/13 2007 11/09/13 2357 11/10/13 0408 11/10/13 0810  GLUCAP 109* 127* 95 106* 129* 88   CXR:  1/11 >>> ETT in good position, CVL has been removed, no overt airspace disease  ASSESSMENT / PLAN:  PULMONARY A:  Acute respiratory failure. Aspiration pneumonia. P:   Goal SpO2>92, pH>7.30 Full mechanical support Daily SBT, mental status is limiting extubation Ventilator bundle Trend ABG / CXR  CARDIOVASCULAR A:  Shock ( hemorrhagic, septic ) - resolved. P:  Goal MAP>60 D/c Levophed gtt  RENAL A:   No active issues. P:   D/c CVP Trend BMP NS@100   GASTROINTESTINAL A:  GI and surgery following. Acute upper GI hemorrhage. Non bleeding varices s/p ligation. Clot in proximal stomach, suspected underlying ulcer. Alcoholic hepatitis / elevated transaminases. Alcoholic cirrhosis. Acute  cholecystitis? SBO? Nutrition. P:   GI following NPO TF when OK with GI Protonix bid D/c Protonix gtt D/c Octreotide gtt Trend LFT HIDA pending, will d/c opioids as below  HEMATOLOGIC A:   Acute blood loss anemia. Coagulopathy. Thrombocytopenia. P:  Goal Hb>7 Trend CBC ( d/c q8h checks ) SCDs  INFECTIOUS A:   Suspected aspiration pneumonia. SBP Px P:   Cultures and antibiotics as above Ciprofloxacin for SBP Px  ENDOCRINE  A:  DM. Hyperglycemia. P:   SSI  NEUROLOGIC A:   Acute encephalopathy. Alcohol abuse. Cocaine abuse. Schizophrenia not on treatment.   P:   Goal RASS 0 to -1 D/c Fentanyl ( DO NOT RESTART) Start Precedex  I have personally obtained history, examined patient, evaluated and interpreted laboratory and imaging results, reviewed medical records, formulated assessment / plan and placed orders.  CRITICAL CARE:  The patient is critically ill with multiple organ systems failure and requires high complexity  decision making for assessment and support, frequent evaluation and titration of therapies, application of advanced monitoring technologies and extensive interpretation of multiple databases. Critical Care Time devoted to patient care services described in this note is 45 minutes.   Doree Fudge, MD [facility] [place] Pager: [phone]  11/10/2013, 10:33 AM

## 2013-11-11 LAB — CBC
HEMATOCRIT: 26.6 % — AB (ref 39.0–52.0)
HEMOGLOBIN: 9.1 g/dL — AB (ref 13.0–17.0)
MCH: 34.7 pg — ABNORMAL HIGH (ref 26.0–34.0)
MCHC: 34.2 g/dL (ref 30.0–36.0)
MCV: 101.5 fL — AB (ref 78.0–100.0)
PLATELETS: 49 10*3/uL — AB (ref 150–400)
RBC: 2.62 MIL/uL — AB (ref 4.22–5.81)
RDW: 20.1 % — ABNORMAL HIGH (ref 11.5–15.5)
WBC: 5.4 10*3/uL (ref 4.0–10.5)

## 2013-11-11 LAB — GLUCOSE, CAPILLARY
Glucose-Capillary: 89 mg/dL (ref 70–99)
Glucose-Capillary: 96 mg/dL (ref 70–99)
Glucose-Capillary: 97 mg/dL (ref 70–99)

## 2013-11-11 LAB — BASIC METABOLIC PANEL
BUN: 36 mg/dL — ABNORMAL HIGH (ref 6–23)
CHLORIDE: 116 meq/L — AB (ref 96–112)
CO2: 19 meq/L (ref 19–32)
CREATININE: 1 mg/dL (ref 0.50–1.35)
Calcium: 7.4 mg/dL — ABNORMAL LOW (ref 8.4–10.5)
GFR calc Af Amer: >90 mL/min (ref >90)
GFR calc non Af Amer: 85 mL/min — ABNORMAL LOW (ref >90)
GLUCOSE: 107 mg/dL — AB (ref 70–99)
Potassium: 4.1 mEq/L (ref 3.7–5.3)
Sodium: 144 mEq/L (ref 137–147)

## 2013-11-11 LAB — CULTURE, RESPIRATORY W GRAM STAIN

## 2013-11-11 LAB — CULTURE, RESPIRATORY

## 2013-11-11 MED ORDER — DEXMEDETOMIDINE HCL IN NACL 200 MCG/50ML IV SOLN
0.0000 ug/kg/h | INTRAVENOUS | Status: DC
Start: 2013-11-11 — End: 2013-11-12
  Administered 2013-11-11 – 2013-11-12 (×4): 0.6 ug/kg/h via INTRAVENOUS
  Filled 2013-11-11 (×6): qty 50

## 2013-11-11 MED ORDER — KCL IN DEXTROSE-NACL 20-5-0.45 MEQ/L-%-% IV SOLN
INTRAVENOUS | Status: DC
  Administered 2013-11-11: 12:00:00 via INTRAVENOUS
  Administered 2013-11-12: 1000 mL via INTRAVENOUS
  Filled 2013-11-11 (×3): qty 1000

## 2013-11-11 MED ORDER — HALOPERIDOL LACTATE 5 MG/ML IJ SOLN
1.0000 mg | INTRAMUSCULAR | Status: DC | PRN
  Administered 2013-11-11 – 2013-11-12 (×4): 4 mg via INTRAVENOUS
  Filled 2013-11-11 (×4): qty 1

## 2013-11-11 MED ORDER — DEXTROSE 5 % IV SOLN
1.0000 g | Freq: Two times a day (BID) | INTRAVENOUS | Status: DC
  Administered 2013-11-11: 1 g via INTRAVENOUS
  Filled 2013-11-11 (×2): qty 1

## 2013-11-11 MED ORDER — FENTANYL CITRATE 0.05 MG/ML IJ SOLN: 12.5000 ug | INTRAMUSCULAR | Status: DC | PRN

## 2013-11-11 NOTE — Progress Notes (Signed)
Pt woke up from sleep and started to get agitated. More nurses were called in. Pt became combative and got out of bed. 4 nurses were able to get him back to bed safely.  Haldol given. Pt beginning to calm down. Will continue to monitor.

## 2013-11-11 NOTE — Progress Notes (Signed)
Daily Rounding Note  11/11/2013, 8:52 AM  LOS: 4 days   SUBJECTIVE:       No BMs > 48 hours.  No bloody of dark aspirate from OG tube  OBJECTIVE:         Vital signs in last 24 hours:    Temp:  [98 F (36.7 C)-98.8 F (37.1 C)] 98.6 F (37 C) (01/12 0400) Pulse Rate:  [41-77] 41 (01/12 0800) Resp:  [10-18] 10 (01/12 0800) BP: (92-158)/(45-77) 101/50 mmHg (01/12 0800) SpO2:  [96 %-100 %] 100 % (01/12 0800) FiO2 (%):  [30 %] 30 % (01/12 0800) Last BM Date: 11/09/13 General: pale, slightly jaundiced.  Looks ill.    Heart: RRR, bradycardia Chest: clear in front.  intubated Abdomen: soft, ND, NT, quiet  Extremities: trace pedal edema but no pitting.  Feet cool, not mottled Neuro/Psych:  Follows some simple commands, opens eyes to voice.   Intake/Output from previous day: 01/11 0701 - 01/12 0700 In: 2625.1 [I.V.:2325.1; IV Piggyback:300] Out: 1140 [Urine:1140]  Intake/Output this shift: Total I/O In: 400 [I.V.:100; IV Piggyback:300] Out: -   . ampicillin-sulbactam (UNASYN) IV  3 g Intravenous Q6H  . antiseptic oral rinse  15 mL Mouth Rinse QID  . chlorhexidine  15 mL Mouth Rinse BID  . ciprofloxacin  400 mg Intravenous Q24H  . insulin aspart  0-9 Units Subcutaneous Q4H  . pantoprazole (PROTONIX) IV  40 mg Intravenous Q12H  . sodium chloride  3 mL Intravenous Q12H  . thiamine  100 mg Intravenous Daily    Lab Results:  Recent Labs  11/09/13 1415 11/10/13 0308 11/11/13 0245  WBC 11.7* 11.3* 5.4  HGB 9.0* 9.9* 9.1*  HCT 25.7* 28.6* 26.6*  PLT 53* 53* 49*   BMET  Recent Labs  11/09/13 0441 11/10/13 0308 11/11/13 0245  NA 144 145 144  K 3.9 4.1 4.1  CL 114* 115* 116*  CO2 19 21 19   GLUCOSE 174* 116* 107*  BUN 40* 38* 36*  CREATININE 1.01 0.96 1.00  CALCIUM 7.7* 7.6* 7.4*   LFT  Recent Labs  11/09/13 0441 11/10/13 0308  PROT 5.7* 5.5*  ALBUMIN 2.4* 2.3*  AST 56* 49*  ALT 58* 51    ALKPHOS 113 97  BILITOT 2.5* 1.3*   PT/INR  Recent Labs  11/08/13 1230  LABPROT 19.1*  INR 1.66*   Hepatitis Panel  Recent Labs  11/08/13 1829  HEPBSAG NEGATIVE  HCVAB Reactive*  HEPAIGM NON REACTIVE  HEPBIGM NON REACTIVE    Studies/Results: Ct Abdomen Pelvis W Contrast 11/09/2013     FINDINGS: Beam hardening streak artifact involving the upper abdomen as the patient was unable to raise the right arm for the examination. Irregular hepatic contour were and relative enlargement of the left lobe and caudate lobe, consistent with the given history of hepatic cirrhosis. No focal hepatic parenchymal masses. Mild splenomegaly, the spleen measuring approximately 8.9 x 10.6 x 14.8 cm, yielding a volume of approximately 700 CC; no focal splenic parenchymal abnormality. Moderate intra-abdominal ascites. Gastroesophageal varices. Patent portal vein and splenic vein.  Cholelithiasis, the gallstones on the order of 5 mm in size. Gallbladder wall thickening/edema. No pericholecystic inflammation. No biliary ductal dilation. Normal-appearing pancreas, adrenal glands, and kidneys. Mild to moderate aortoiliac atherosclerosis without aneurysm. Patent visceral arteries. Multiple normal size and upper normal size lymph nodes scattered throughout the abdomen, the largest in the gastrohepatic ligament measuring approximately 2.9 x 1.1 cm. No nodal masses.  Normal appearing stomach. Mild dilation of a segment of jejunum in the left upper quadrant, fluid-filled, with normal caliber small bowel proximal and distal to this segment. Remainder of the small bowel normal in appearance. There are small bowel loops positioned lateral to the descending colon in the left mid abdomen, including the dilated loop. Marked thickening/edema involving the wall of the cecum and ascending colon. Remainder of the colon decompressed and unremarkable. No free intraperitoneal air.  Urinary bladder decompressed by Foley catheter,  accounting for the gas within the bladder. Prostate gland upper normal in size. Normal seminal vesicles. Phleboliths low in the left side of the pelvis.  Bone window images demonstrate lower thoracic spondylosis and mild degenerative changes involving the lumbar spine. Visualized lung bases clear apart from the expected dependent atelectasis posteriorly. Heart size normal.  IMPRESSION: 1. Hepatic cirrhosis and evidence of portal hypertension with mild splenomegaly, moderate ascites, and gastroesophageal varices. Patent portal vein and splenic vein. 2. Cholelithiasis. Marked gallbladder wall thickening/edema which can be seen in patients with hepatic cirrhosis. Is there clinical evidence of acute cholecystitis? 3. Isolated dilated loop of jejunum in the left upper quadrant. The appearance of this loop (folded upon itself with the small bowel proximal and distal to this loop of normal caliber) raises the question of a closed loop obstruction. The obstruction may just be due to an adhesion involving both ends of the dilated segment. There is no evidence of wall thickening involving this segment. 4. Small bowel loops in the left upper abdomen position lateral to the descending colon, including the dilated segment. This can occasionally be seen in patients with an internal hernia. 5. Ascending colitis. 6. Shoddy lymphadenopathy in the abdomen. Results were discussed with the GI Medicine attending at the time of interpretation on 11/09/2013 at 1130 hr.   Electronically Signed   By: Evangeline Dakin M.D.   On: 11/09/2013 11:38   Dg Chest Port 1 View 11/10/2013    FINDINGS: An endotracheal tube with tip 2.7 cm above the carina again noted.  Removal of a right central venous catheter is noted.  This is a low volume film with mild peribronchial thickening again noted.  The cardiomediastinal silhouette is stable.  There is no evidence of pneumothorax or pleural effusion.  IMPRESSION: Slightly low volume film and removal of  right central venous catheter. No other significant changes noted.   Electronically Signed   By: Hassan Rowan M.D.   On: 11/10/2013 09:05   Dg Abd Portable 1v 11/10/2013    FINDINGS: The bowel gas pattern is unremarkable. Gas in the colon and rectum are noted.  No dilated bowel loops are present.  Calcified gallstones are again noted.  No acute bony abnormalities are present.  IMPRESSION: No radiographic evidence of acute abnormality. Bowel gas pattern is unremarkable on today's study.  Cholelithiasis.   Electronically Signed   By: Hassan Rowan M.D.   On: 11/10/2013 09:36    ASSESMENT:   *  Upper GI bleed, s/p 1/9 EGD with 2 medium sized varices, no obvious recent site of bleeding. 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  The blood clot in proximal stomach precluded complete view of the stomach and so perhaps there was offending site underlying. On octreotide,   *  New diagnoses of cirrhosis.  Moderate ascites on CT.  LFTs improved. Completed octreotide drip and PPI drip.  Now on BID IV Protonix. HCV Ab is reactive. Sister did not endorse heavy use of ETOH.    *  ABL anemia.  S/p 2 PRBCs.  *  GB wall thickening, cholelithiasis  *   Dilated loop of jejunum on CT, no bowel obstruction on follow up xray. Ascending colitis, ? Internal hernia on CT scan.   *  Encephalopathy, mild elevation of ammonia to 81.  Not convinced this is hepatic encephalopathy.  3 decade history of schizophrenia, UDS + for cocaine on admission.   *  Coagulopathy.  S/p vitamin K and 2 FFP  *  DM.  No meds PTA  *  Suspicion of aspiration PNA.  On cipro, unasyn.     PLAN   *  HIDA scan today.  Per Dr Brantley Stage of surgery:  If +, recommends per drainage.   *  Once determination as to need for biliary intervention made, we can decide about starting tube feeds.     Azucena Freed  11/11/2013, 8:52 AM Pager: [phone]    Attending physician's note   I have taken an interval history, reviewed the  chart and examined the patient. I agree with the Advanced Practitioner's note, impression and recommendations. No recurrent GI bleeding. Cirrhosis with + HCV Ab. Encephalopathy, etiology unclear. Await HIDA scan.  Pricilla Riffle. Fuller Plan, MD [facility]

## 2013-11-11 NOTE — Progress Notes (Signed)
3 Days Post-Op  Subjective: ON VENT  OPENS EYES BUT NOT VERY RESPONSIVE  Objective: Vital signs in last 24 hours: Temp:  [98 F (36.7 C)-98.8 F (37.1 C)] 98.6 F (37 C) (01/12 0400) Pulse Rate:  [43-77] 50 (01/12 0757) Resp:  [10-18] 12 (01/12 0757) BP: (92-158)/(45-77) 103/59 mmHg (01/12 0757) SpO2:  [96 %-100 %] 100 % (01/12 0757) FiO2 (%):  [30 %] 30 % (01/12 0757) Last BM Date: 11/09/13  Intake/Output from previous day: 01/11 0701 - 01/12 0700 In: 2525.1 [I.V.:2225.1; IV Piggyback:300] Out: 1140 [Urine:1140] Intake/Output this shift:    GI: normal findings: soft, non-tender  Lab Results:   Recent Labs  11/10/13 0308 11/11/13 0245  WBC 11.3* 5.4  HGB 9.9* 9.1*  HCT 28.6* 26.6*  PLT 53* 49*   BMET  Recent Labs  11/10/13 0308 11/11/13 0245  NA 145 144  K 4.1 4.1  CL 115* 116*  CO2 21 19  GLUCOSE 116* 107*  BUN 38* 36*  CREATININE 0.96 1.00  CALCIUM 7.6* 7.4*   PT/INR  Recent Labs  11/08/13 1230  LABPROT 19.1*  INR 1.66*   ABG  Recent Labs  11/08/13 1407  PHART 7.505*  HCO3 25.7*    Studies/Results: Ct Abdomen Pelvis W Contrast  11/09/2013   CLINICAL DATA:  Ventilator dependent respiratory failure. Patient presented with acute upper GI bleeding and had esophageal varices clamped yesterday. Current history of cirrhosis. Possible sepsis.  EXAM: CT ABDOMEN AND PELVIS WITH CONTRAST  TECHNIQUE: Multidetector CT imaging of the abdomen and pelvis was performed using the standard protocol following bolus administration of intravenous contrast.  CONTRAST:  171mL OMNIPAQUE IOHEXOL 300 MG/ML IV. Oral contrast was not administered.  COMPARISON:  None.  FINDINGS: Beam hardening streak artifact involving the upper abdomen as the patient was unable to raise the right arm for the examination. Irregular hepatic contour were and relative enlargement of the left lobe and caudate lobe, consistent with the given history of hepatic cirrhosis. No focal hepatic  parenchymal masses. Mild splenomegaly, the spleen measuring approximately 8.9 x 10.6 x 14.8 cm, yielding a volume of approximately 700 CC; no focal splenic parenchymal abnormality. Moderate intra-abdominal ascites. Gastroesophageal varices. Patent portal vein and splenic vein.  Cholelithiasis, the gallstones on the order of 5 mm in size. Gallbladder wall thickening/edema. No pericholecystic inflammation. No biliary ductal dilation. Normal-appearing pancreas, adrenal glands, and kidneys. Mild to moderate aortoiliac atherosclerosis without aneurysm. Patent visceral arteries. Multiple normal size and upper normal size lymph nodes scattered throughout the abdomen, the largest in the gastrohepatic ligament measuring approximately 2.9 x 1.1 cm. No nodal masses.  Normal appearing stomach. Mild dilation of a segment of jejunum in the left upper quadrant, fluid-filled, with normal caliber small bowel proximal and distal to this segment. Remainder of the small bowel normal in appearance. There are small bowel loops positioned lateral to the descending colon in the left mid abdomen, including the dilated loop. Marked thickening/edema involving the wall of the cecum and ascending colon. Remainder of the colon decompressed and unremarkable. No free intraperitoneal air.  Urinary bladder decompressed by Foley catheter, accounting for the gas within the bladder. Prostate gland upper normal in size. Normal seminal vesicles. Phleboliths low in the left side of the pelvis.  Bone window images demonstrate lower thoracic spondylosis and mild degenerative changes involving the lumbar spine. Visualized lung bases clear apart from the expected dependent atelectasis posteriorly. Heart size normal.  IMPRESSION: 1. Hepatic cirrhosis and evidence of portal hypertension with mild splenomegaly,  moderate ascites, and gastroesophageal varices. Patent portal vein and splenic vein. 2. Cholelithiasis. Marked gallbladder wall thickening/edema which  can be seen in patients with hepatic cirrhosis. Is there clinical evidence of acute cholecystitis? 3. Isolated dilated loop of jejunum in the left upper quadrant. The appearance of this loop (folded upon itself with the small bowel proximal and distal to this loop of normal caliber) raises the question of a closed loop obstruction. The obstruction may just be due to an adhesion involving both ends of the dilated segment. There is no evidence of wall thickening involving this segment. 4. Small bowel loops in the left upper abdomen position lateral to the descending colon, including the dilated segment. This can occasionally be seen in patients with an internal hernia. 5. Ascending colitis. 6. Shoddy lymphadenopathy in the abdomen. Results were discussed with the GI Medicine attending at the time of interpretation on 11/09/2013 at 1130 hr.   Electronically Signed   By: Evangeline Dakin M.D.   On: 11/09/2013 11:38   Dg Chest Port 1 View  11/10/2013   CLINICAL DATA:  53 year old male with respiratory failure.  EXAM: PORTABLE CHEST - 1 VIEW  COMPARISON:  11/09/2013 and prior chest radiographs  FINDINGS: An endotracheal tube with tip 2.7 cm above the carina again noted.  Removal of a right central venous catheter is noted.  This is a low volume film with mild peribronchial thickening again noted.  The cardiomediastinal silhouette is stable.  There is no evidence of pneumothorax or pleural effusion.  IMPRESSION: Slightly low volume film and removal of right central venous catheter. No other significant changes noted.   Electronically Signed   By: Hassan Rowan M.D.   On: 11/10/2013 09:05   Dg Abd Portable 1v  11/10/2013   CLINICAL DATA:  53 year old male with abdominal pain. Possible small bowel obstruction.  EXAM: PORTABLE ABDOMEN - 1 VIEW  COMPARISON:  11/09/2013 CT  FINDINGS: The bowel gas pattern is unremarkable. Gas in the colon and rectum are noted.  No dilated bowel loops are present.  Calcified gallstones are  again noted.  No acute bony abnormalities are present.  IMPRESSION: No radiographic evidence of acute abnormality. Bowel gas pattern is unremarkable on today's study.  Cholelithiasis.   Electronically Signed   By: Hassan Rowan M.D.   On: 11/10/2013 09:36    Anti-infectives: Anti-infectives   Start     Dose/Rate Route Frequency Ordered Stop   11/09/13 1200  ciprofloxacin (CIPRO) IVPB 400 mg  Status:  Discontinued     400 mg 200 mL/hr over 60 Minutes Intravenous Every 12 hours 11/09/13 1109 11/09/13 1125   11/09/13 1200  ciprofloxacin (CIPRO) IVPB 400 mg     400 mg 200 mL/hr over 60 Minutes Intravenous Every 24 hours 11/09/13 1125     11/08/13 1500  Ampicillin-Sulbactam (UNASYN) 3 g in sodium chloride 0.9 % 100 mL IVPB     3 g 100 mL/hr over 60 Minutes Intravenous Every 6 hours 11/08/13 1349        Assessment/Plan: s/p Procedure(s) with comments: ESOPHAGOGASTRODUODENOSCOPY (EGD) (N/A) - bedside GALLSTONES Patient Active Problem List   Diagnosis Date Noted  . Dehydration 11/08/2013  . Tachycardia 11/08/2013  . Leukocytosis 11/08/2013  . 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 Mild Coagulopathy 11/08/2013  . Cocaine abuse 11/08/2013  . Thrombocytopenia 11/08/2013  . Acute respiratory failure 11/08/2013  . Esophageal varices with bleeding 11/08/2013  . Hepatic encephalopathy 11/07/2013  . Schizophrenia 11/07/2013  . Acute upper GI bleed 11/07/2013  . ?  Diabetes 09/23/2013  . Smoking 09/23/2013  . Back pain 09/23/2013   HIDA today IF POSITIVE WOULD RECOMMEND PERCUTANEOUS DRAINAGE  NO EVIDENCE OF SBO ON FILMS AT THIS POINT.   LOS: 4 days    Eloyce Bultman A. 11/11/2013

## 2013-11-11 NOTE — Progress Notes (Signed)
PULMONARY / CRITICAL CARE MEDICINE  Name: Oscar Swanson MRN: [id number] DOB: 1961-04-24    ADMISSION DATE:  11/07/2013 CONSULTATION DATE:  11/08/2013  REFERRING MD :  EDP PRIMARY SERVICE:  PCCM  CHIEF COMPLAINT:  Acute GI hemorrhage  BRIEF PATIENT DESCRIPTION: 53 year old male w/ h/o schizophrenia (not on meds) and ETOH, admitted on 1/8 w/ acute upper GI bleed and acute agitated encephalopathy. Agitation was treated w/ lorazepam and Haldol. Prior to ENDO was sedated but stable awaiting EGD. Developed acute respiratory distress in Endo holding awaiting EGD on 1/9, emergently intubated.  SIGNIFICANT EVENTS / STUDIES:  1/8    Admitted w/ acute encephalopathy and UGIB, treated w/ PPI gtt, transfusion and sedation w/ plan for EGD  1/9    Dveloped acute resp distress. Required emergent intubation by anesthesia. Initially intubated esophagus w/ large vol bloody gastric output. Moved to ICU for stabilization.  1/9    EGD Ardis Hughs) >>> There was old blood clot and hematin in the stomach. There were two trunks of medium sized varices, no obvious recent site of bleeding. 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 The blood clot in proximal stomach precluded complete view of the stomach and so perhaps there was offending site underlying. I chose to band ligate the esopahgeal varices (5 bands placed in good position). The examination was otherwise normal.  1/8    CT head >>> negative  1/10  CT Abdomen >>>  Hepatic cirrhosis and evidence of portal hypertension with mild splenomegaly, moderate ascites, and gastroesophageal varices. Cholelithiasis. Marked gallbladder wall thickening/edema which can be seen in patients with hepatic cirrhosis 1/12 RASS -1 on dexmedetomidine infusion. + F/C. Passed SBT. Extubated and tolerating but intermittently combative. Dex infusion restarted  LINES / TUBES: ETT 1/9 >> 1/12 R [place] CVL 1/9 >> 1/10  CULTURES: 1/2  MRSA PCR >>> neg 1/9 Urine >>> neg 1/9  Respiratory >>>  ANTIBIOTICS: Unasyn 1/9 >>> Ciprofloxacin 1/10  >> 1/12  INTERVAL HISTORY:  RASS -1 on dexmedetomidine infusion. + F/C. Passed SBT. Extubated and looks good but intermittently combative. Dex infusion restarted  VITAL SIGNS: Temp:  [97 F (36.1 C)-98.6 F (37 C)] 97.6 F (36.4 C) (01/12 1600) Pulse Rate:  [39-73] 47 (01/12 1700) Resp:  [10-19] 12 (01/12 1700) BP: (92-138)/(45-80) 97/61 mmHg (01/12 1700) SpO2:  [97 %-100 %] 100 % (01/12 1700) FiO2 (%):  [30 %] 30 % (01/12 0800)  HEMODYNAMICS:    VENTILATOR SETTINGS: Vent Mode:  [-] PRVC FiO2 (%):  [30 %] 30 % Set Rate:  [12 bmp] 12 bmp Vt Set:  [500 mL] 500 mL PEEP:  [5 cmH20] 5 cmH20 Plateau Pressure:  [9 cmH20-16 cmH20] 16 cmH20  INTAKE / OUTPUT: Intake/Output     01/11 0701 - 01/12 0700 01/12 0701 - 01/13 0700   I.V. (mL/kg) 2325.1 (25.7) 829.4 (9.2)   IV Piggyback 300 450   Total Intake(mL/kg) 2625.1 (29) 1279.4 (14.1)   Urine (mL/kg/hr) 1140 (0.5) 600 (0.6)   Total Output 1140 600   Net +1485.1 +679.4          PHYSICAL EXAMINATION: General: + F/C Neuro:  MAEs HEENT: WNL Cardiovascular:  RRR, no m/r/g Lungs:  Bilateral diminished air entry, no w/r/r Abdomen:  Soft, nontender, bowel sounds diminished Musculoskeletal:  No edema Skin:  Intact  LABS: I have reviewed all of today's lab results. Relevant abnormalities are discussed in the A/P section  CXR:  NNF  ASSESSMENT / PLAN:  PULMONARY A:  Acute  respiratory failure - intubated for EGD P:   Extubate 1/12 Monitor in ICU post extubation  CARDIOVASCULAR A:  Shock, resolved. P:  Monitor  RENAL A:   No active issues. P:   Monitor BMET intermittently Correct electrolytes as indicated IVFs ordered  GASTROINTESTINAL A:  Acute upper GI hemorrhage. Non bleeding varices s/p ligation. Clot in proximal stomach, suspected underlying ulcer. Elevated transaminases - improving Alcoholic cirrhosis. Doubt acute cholecystitis (abd  exam benign) Doubt SBO (abd exam benign) P:   GI/CCS following NPO post extubation Cont PPI  Trend LFT HIDA cancelled due to inability to cooperate - query whether needed now!!  HEMATOLOGIC A:   Acute blood loss anemia. Coagulopathy. Thrombocytopenia. P:  Monitor CBC and coags intermittently Transfuse per ICU guidelines  INFECTIOUS A:   Doubt aspiration pneumonia. Doubt needs further SBP px (and cipro might be contributing to delirium) Possible acute cholecystitis P:   Cultures and antibiotics as above  ENDOCRINE  A:  DM II P:   Cont SSI  NEUROLOGIC A:   Acute encephalopathy. Alcohol abuse. Cocaine abuse. Schizophrenia not on treatment.   P:   Dexmedetomidine to goal RASS 0 to -1 PRN Haldol  I have personally obtained history, examined patient, evaluated and interpreted laboratory and imaging results, reviewed medical records, formulated assessment / plan and placed orders.  CRITICAL CARE:  The patient is critically ill with multiple organ systems failure and requires high complexity decision making for assessment and support, frequent evaluation and titration of therapies, application of advanced monitoring technologies and extensive interpretation of multiple databases. Critical Care Time devoted to patient care services described in this note is 45 minutes.   Merton Border, MD Pulmonary and [place] Pager: [phone]  11/11/2013, 5:54 PM

## 2013-11-11 NOTE — Progress Notes (Signed)
Called into pt's room by another nurse. Pt had already pulled out one IV. Pt was very agitated and trying to get out of bed. Pt also trying to swing at the nurses. Pt ended up pulling out another IV before we got him back into bed. Dr. Alva Garnet was paged. Orders to start precedex. Will continue to monitor.

## 2013-11-11 NOTE — Progress Notes (Signed)
11/11/2013- Resp Care Note- Pt suctioned and extubated as per MD order.  Pt initially placed on 5lpm cannula with sats of 100%.  Pt weaned to 4lpm with sats of 99-100%.  Pt hr 48, bp 114/65, rr 12.  Pt able to vocalize post extubation.  Will monitor progress and wean oxygen as tolerated.  S Daryan Cagley rrt, rcp

## 2013-11-12 ENCOUNTER — Encounter (HOSPITAL_COMMUNITY): Payer: Self-pay | Admitting: Gastroenterology

## 2013-11-12 ENCOUNTER — Inpatient Hospital Stay (HOSPITAL_COMMUNITY): Payer: Medicaid Other

## 2013-11-12 DIAGNOSIS — E860 Dehydration: Secondary | ICD-10-CM

## 2013-11-12 LAB — COMPREHENSIVE METABOLIC PANEL
ALBUMIN: 2 g/dL — AB (ref 3.5–5.2)
ALT: 43 U/L (ref 0–53)
AST: 47 U/L — ABNORMAL HIGH (ref 0–37)
Alkaline Phosphatase: 89 U/L (ref 39–117)
BUN: 30 mg/dL — AB (ref 6–23)
CALCIUM: 7.6 mg/dL — AB (ref 8.4–10.5)
CO2: 16 mEq/L — ABNORMAL LOW (ref 19–32)
CREATININE: 0.8 mg/dL (ref 0.50–1.35)
Chloride: 119 mEq/L — ABNORMAL HIGH (ref 96–112)
GFR calc Af Amer: >90 mL/min (ref >90)
GFR calc non Af Amer: >90 mL/min (ref >90)
Glucose, Bld: 101 mg/dL — ABNORMAL HIGH (ref 70–99)
Potassium: 4.7 mEq/L (ref 3.7–5.3)
Sodium: 147 mEq/L (ref 137–147)
TOTAL PROTEIN: 5.1 g/dL — AB (ref 6.0–8.3)
Total Bilirubin: 1.3 mg/dL — ABNORMAL HIGH (ref 0.3–1.2)

## 2013-11-12 LAB — GLUCOSE, CAPILLARY
GLUCOSE-CAPILLARY: 116 mg/dL — AB (ref 70–99)
GLUCOSE-CAPILLARY: 81 mg/dL (ref 70–99)
Glucose-Capillary: 95 mg/dL (ref 70–99)
Glucose-Capillary: 104 mg/dL — ABNORMAL HIGH (ref 70–99)
Glucose-Capillary: 82 mg/dL (ref 70–99)
Glucose-Capillary: 111 mg/dL — ABNORMAL HIGH (ref 70–99)
Glucose-Capillary: 115 mg/dL — ABNORMAL HIGH (ref 70–99)
Glucose-Capillary: 106 mg/dL — ABNORMAL HIGH (ref 70–99)
Glucose-Capillary: 92 mg/dL (ref 70–99)

## 2013-11-12 LAB — CBC
HEMATOCRIT: 28.3 % — AB (ref 39.0–52.0)
Hemoglobin: 9.4 g/dL — ABNORMAL LOW (ref 13.0–17.0)
MCH: 33.7 pg (ref 26.0–34.0)
MCHC: 33.2 g/dL (ref 30.0–36.0)
MCV: 101.4 fL — AB (ref 78.0–100.0)
Platelets: 49 10*3/uL — ABNORMAL LOW (ref 150–400)
RBC: 2.79 MIL/uL — ABNORMAL LOW (ref 4.22–5.81)
RDW: 19.3 % — AB (ref 11.5–15.5)
WBC: 3.7 10*3/uL — ABNORMAL LOW (ref 4.0–10.5)

## 2013-11-12 MED ORDER — BIOTENE DRY MOUTH MT LIQD
15.0000 mL | Freq: Two times a day (BID) | OROMUCOSAL | Status: DC
  Administered 2013-11-12 – 2013-11-14 (×4): 15 mL via OROMUCOSAL

## 2013-11-12 MED ORDER — INFLUENZA VAC SPLIT QUAD 0.5 ML IM SUSP
0.5000 mL | INTRAMUSCULAR | Status: AC
  Administered 2013-11-14: 0.5 mL via INTRAMUSCULAR
  Filled 2013-11-12: qty 0.5

## 2013-11-12 MED ORDER — DEXMEDETOMIDINE HCL IN NACL 200 MCG/50ML IV SOLN
0.0000 ug/kg/h | INTRAVENOUS | Status: DC
  Administered 2013-11-12: 0.4 ug/kg/h via INTRAVENOUS

## 2013-11-12 MED ORDER — PANTOPRAZOLE SODIUM 40 MG PO TBEC
40.0000 mg | DELAYED_RELEASE_TABLET | Freq: Two times a day (BID) | ORAL | Status: DC
  Administered 2013-11-12 – 2013-11-13 (×2): 40 mg via ORAL
  Filled 2013-11-12 (×2): qty 1

## 2013-11-12 MED ORDER — DEXTROSE 5 % IV SOLN
INTRAVENOUS | Status: DC
  Administered 2013-11-12: 75 mL via INTRAVENOUS
  Administered 2013-11-13: 10:00:00 via INTRAVENOUS

## 2013-11-12 MED ORDER — QUETIAPINE FUMARATE 100 MG PO TABS
100.0000 mg | ORAL_TABLET | Freq: Two times a day (BID) | ORAL | Status: DC
  Administered 2013-11-12 – 2013-11-15 (×7): 100 mg via ORAL
  Filled 2013-11-12 (×10): qty 1

## 2013-11-12 MED ORDER — DEXMEDETOMIDINE HCL IN NACL 200 MCG/50ML IV SOLN: 0.0000 ug/kg/h | INTRAVENOUS | Status: DC

## 2013-11-12 NOTE — Progress Notes (Signed)
PULMONARY / CRITICAL CARE MEDICINE  Name: CHAYDEN GARRELTS MRN: [id number] DOB: [birth year]    ADMISSION DATE:  11/07/2013 CONSULTATION DATE:  11/08/2013  REFERRING MD :  EDP PRIMARY SERVICE:  PCCM  CHIEF COMPLAINT:  Acute GI hemorrhage  BRIEF PATIENT DESCRIPTION: 53 year old male w/ h/o schizophrenia (not on meds) and ETOH, admitted on 1/8 w/ acute upper GI bleed and acute agitated encephalopathy. Agitation was treated w/ lorazepam and Haldol. Prior to ENDO was sedated but stable awaiting EGD. Developed acute respiratory distress in Endo holding awaiting EGD on 1/9, emergently intubated.  SIGNIFICANT EVENTS / STUDIES:  1/8    Admitted w/ acute encephalopathy and UGIB, treated w/ PPI gtt, transfusion and sedation w/ plan for EGD  1/9    Dveloped acute resp distress. Required emergent intubation by anesthesia. Initially intubated esophagus w/ large vol bloody gastric output. Moved to ICU for stabilization.  1/9    EGD Ardis Hughs) >>> There was old blood clot and hematin in the stomach. There were two trunks of medium sized varices, no obvious recent site of bleeding. 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 The blood clot in proximal stomach precluded complete view of the stomach and so perhaps there was offending site underlying. I chose to band ligate the esopahgeal varices (5 bands placed in good position). The examination was otherwise normal.  1/8    CT head >>> negative  1/10  CT Abdomen >>>  Hepatic cirrhosis and evidence of portal hypertension with mild splenomegaly, moderate ascites, and gastroesophageal varices. Cholelithiasis. Marked gallbladder wall thickening/edema which can be seen in patients with hepatic cirrhosis 1/12 RASS -1 on dexmedetomidine infusion. + F/C. Passed SBT. Extubated and tolerating but intermittently combative. Dex infusion restarted 1/13 Agitated off dexmedetomidine.  Somnolent on dex. Seroquel initiated.   LINES / TUBES: ETT 1/9 >> 1/12 R [place]  CVL 1/9 >> 1/10  CULTURES: 1/2  MRSA PCR >>> neg 1/9 Urine >>> neg 1/9 Respiratory >> moraxella  ANTIBIOTICS: Unasyn 1/9 >> 1/13 Ciprofloxacin 1/10  >> 1/12  INTERVAL HISTORY:  RASS -1 on dexmedetomidine infusion.   VITAL SIGNS: Temp:  [97.1 F (36.2 C)-98 F (36.7 C)] 97.2 F (36.2 C) (01/13 1600) Pulse Rate:  [42-74] 44 (01/13 0700) Resp:  [9-17] 12 (01/13 1600) BP: (88-132)/(42-72) 109/62 mmHg (01/13 1600) SpO2:  [96 %-100 %] 96 % (01/13 1600)  HEMODYNAMICS:    VENTILATOR SETTINGS:    INTAKE / OUTPUT: Intake/Output     01/12 0701 - 01/13 0700 01/13 0701 - 01/14 0700   I.V. (mL/kg) 2144.8 (23.7) 265.8 (2.9)   IV Piggyback 650 100   Total Intake(mL/kg) 2794.8 (30.9) 365.8 (4)   Urine (mL/kg/hr) 1030 (0.5) 150 (0.2)   Total Output 1030 150   Net +1764.8 +215.8          PHYSICAL EXAMINATION: General: + F/C Neuro:  MAEs HEENT: WNL Cardiovascular:  RRR, no m/r/g Lungs:  Bilateral diminished air entry, no w/r/r Abdomen:  Soft, nontender, bowel sounds diminished Musculoskeletal:  No edema Skin:  Intact  LABS: I have reviewed all of today's lab results. Relevant abnormalities are discussed in the A/P section  CXR:  NNF  ASSESSMENT / PLAN:  PULMONARY A:  Acute respiratory failure, resolved P:   Cont to monitor  CARDIOVASCULAR A:  Shock, resolved. P:  Monitor  RENAL A:   hypernatremia P:   Monitor BMET intermittently Correct electrolytes as indicated Cont free water repletion  GASTROINTESTINAL A:  Acute upper GI hemorrhage. Non  bleeding varices s/p ligation. Clot in proximal stomach, suspected underlying ulcer. Elevated transaminases - improving Alcoholic cirrhosis. P:   GI/CCS following Advance diet Cont PPI  Trend LFT intermittently  HEMATOLOGIC A:   Acute blood loss anemia. Coagulopathy. Thrombocytopenia, likely hypersplenism P:  Monitor CBC and coags intermittently Transfuse per ICU guidelines  INFECTIOUS A:   Doubt  aspiration pneumonia. Possible acute cholecystitis P:   Cultures and antibiotics as above  ENDOCRINE  A:  DM II P:   Cont SSI  NEUROLOGIC A:   Acute encephalopathy Alcohol abuse Cocaine abuse Schizophrenia not on treatment PTA   P:   Seroquel initiated 1/12 Cont PRN Haldol    Merton Border, MD Pulmonary and [place] Pager: [phone]  11/12/2013, 5:53 PM

## 2013-11-12 NOTE — Progress Notes (Signed)
Pt foley taken out at 0900 this morning. Pt has not voided since then and does not have the urge to go. Pt was bladder scanned with 100 ml. Dr. Alva Garnet was notified. No new orders. Will continue to monitor.

## 2013-11-12 NOTE — Progress Notes (Signed)
Overnight events noted.  Doubt cholecystitis or SBO at this point.  Hold HIDA for now since suspicion low.  Will sign off at this point.  Available as needed.

## 2013-11-12 NOTE — Progress Notes (Signed)
Daily Rounding Note  11/12/2013, 8:27 AM  LOS: 5 days   SUBJECTIVE:       4 RNs required to get pt back to bed when acutely agitated.  Calmer after Haldol and on Precedex drip.  HIDA scan cancelled and Dr Brantley Stage doubtful of cholecystitis, test will not be reordered.   OBJECTIVE:         Vital signs in last 24 hours:    Temp:  [97 F (36.1 C)-98 F (36.7 C)] 98 F (36.7 C) (01/12 2000) Pulse Rate:  [39-74] 44 (01/13 0700) Resp:  [9-19] 10 (01/13 0700) BP: (88-138)/(42-80) 98/59 mmHg (01/13 0700) SpO2:  [97 %-100 %] 99 % (01/13 0700) Last BM Date: 11/09/13 General: unresponsive, sedated.  Ashen coloring   Heart: RRR Chest:  Unlabored breathing, some ronchi from upper airway.  Abdomen: soft, NT, ND.  Active BS  Extremities: slight, non-pitting left pedl edema Neuro/Psych:  Sedated, unable to assess.   Intake/Output from previous day: 01/12 0701 - 01/13 0700 In: 2706.2 [I.V.:2056.2; IV Piggyback:650] Out: 1030 [[id number]]  Intake/Output this shift:    Lab Results:  Recent Labs  11/10/13 0308 11/11/13 0245 11/12/13 0300  WBC 11.3* 5.4 3.7*  HGB 9.9* 9.1* 9.4*  HCT 28.6* 26.6* 28.3*  PLT 53* 49* 49*   BMET  Recent Labs  11/10/13 0308 11/11/13 0245 11/12/13 0300  NA 145 144 147  K 4.1 4.1 4.7  CL 115* 116* 119*  CO2 21 19 16*  GLUCOSE 116* 107* 101*  BUN 38* 36* 30*  CREATININE 0.96 1.00 0.80  CALCIUM 7.6* 7.4* 7.6*   LFT  Recent Labs  11/10/13 0308 11/12/13 0300  PROT 5.5* 5.1*  ALBUMIN 2.3* 2.0*  AST 49* 47*  ALT 51 43  ALKPHOS 97 89  BILITOT 1.3* 1.3*   Studies/Results: Dg Chest Port 1 View  11/12/2013   CLINICAL DATA:  Respiratory failure.  EXAM: PORTABLE CHEST - 1 VIEW  COMPARISON:  11/10/2013  FINDINGS: Endotracheal tube has been removed. Mild bibasilar airspace disease is unchanged. No effusion or edema.  IMPRESSION: Mild bibasilar airspace disease is unchanged.  Interval  extubation.   Electronically Signed   By: Franchot Gallo M.D.   On: 11/12/2013 07:27    ASSESMENT:   * Upper GI bleed, s/p 1/9 EGD with 2 medium sized varices, no obvious recent site of bleeding, diffuse moderate to severe portal gastropathy.  Blood clot in proximal stomach precluded complete view of the stomach and so perhaps there was offending site underlying.  Unasyn should cover for SBP prophylaxis.  * New diagnoses of cirrhosis. Moderate ascites on CT. LFTs improved. Completed octreotide drip and PPI drip. Now on BID IV Protonix. HCV Ab is reactive. Sister did not endorse heavy use of ETOH.  * ABL anemia. S/p 2 PRBCs. Hgb stable.  * General pancytopenia with leukopenia, thrombocytopenia, anemia.  * GB wall thickening, cholelithiasis.  HIDA scan cancelled.  * Dilated loop of jejunum on CT, no bowel obstruction on follow up xray. Ascending colitis, ? Internal hernia on CT scan.  * Encephalopathy, mild elevation of ammonia to 81. Not convinced this is hepatic encephalopathy. Acute delerium, agitation overnight. Treated with haldol 3 decade history of schizophrenia, UDS + for cocaine on admission.  * Coagulopathy. S/p vitamin K and 2 FFP  * DM. No meds PTA  * Suspicion of aspiration PNA. On cipro, unasyn. Dr Alva Garnet doubtful, notes Cipro may be contributing to delerium. It has  been cancelled. Extubated 11/13/13     PLAN   *  If mental status allows, could begin po feeding though nurse suggests he get SL eval as he spit up sips of water yesterday.  Once taking pos, can change to po Protonix.  Would avoid placing NGT as this will exacerbate agitation tendency.  *  Likely will need relook EGD and posible rebanding in several weeks.   *  Will d/w MD need to initiate non-selective BB (when taking po) to reduce portal pressures    Azucena Freed  11/12/2013, 8:27 AM Pager: [phone]     Attending physician's note   I have taken an interval history, reviewed the chart and examined the patient.  I agree with the Advanced Practitioner's note, impression and recommendations.   Pricilla Riffle. Fuller Plan, MD [facility]

## 2013-11-13 ENCOUNTER — Encounter: Payer: Self-pay | Admitting: Gastroenterology

## 2013-11-13 LAB — BASIC METABOLIC PANEL
BUN: 26 mg/dL — ABNORMAL HIGH (ref 6–23)
CO2: 18 mEq/L — ABNORMAL LOW (ref 19–32)
Calcium: 7.5 mg/dL — ABNORMAL LOW (ref 8.4–10.5)
Chloride: 114 mEq/L — ABNORMAL HIGH (ref 96–112)
Creatinine, Ser: 1.11 mg/dL (ref 0.50–1.35)
GFR calc Af Amer: 87 mL/min — ABNORMAL LOW (ref >90)
GFR calc non Af Amer: 75 mL/min — ABNORMAL LOW (ref >90)
Glucose, Bld: 89 mg/dL (ref 70–99)
Potassium: 3.6 mEq/L — ABNORMAL LOW (ref 3.7–5.3)
Sodium: 143 mEq/L (ref 137–147)

## 2013-11-13 LAB — GLUCOSE, CAPILLARY
GLUCOSE-CAPILLARY: 82 mg/dL (ref 70–99)
Glucose-Capillary: 88 mg/dL (ref 70–99)
Glucose-Capillary: 151 mg/dL — ABNORMAL HIGH (ref 70–99)

## 2013-11-13 MED ORDER — PANTOPRAZOLE SODIUM 40 MG PO TBEC
40.0000 mg | DELAYED_RELEASE_TABLET | Freq: Every day | ORAL | Status: DC
Start: 2013-11-14 — End: 2013-11-15
  Administered 2013-11-14 – 2013-11-15 (×2): 40 mg via ORAL
  Filled 2013-11-13 (×2): qty 1

## 2013-11-13 MED ORDER — FOLIC ACID 1 MG PO TABS
1.0000 mg | ORAL_TABLET | Freq: Every day | ORAL | Status: DC
Start: 2013-11-13 — End: 2013-11-15
  Administered 2013-11-13 – 2013-11-15 (×3): 1 mg via ORAL
  Filled 2013-11-13 (×4): qty 1

## 2013-11-13 MED ORDER — POTASSIUM CL IN DEXTROSE 5% 20 MEQ/L IV SOLN
20.0000 meq | INTRAVENOUS | Status: DC
  Administered 2013-11-13: 20 meq via INTRAVENOUS
  Filled 2013-11-13 (×2): qty 1000

## 2013-11-13 MED ORDER — ENSURE COMPLETE PO LIQD
237.0000 mL | Freq: Two times a day (BID) | ORAL | Status: DC
  Administered 2013-11-13 – 2013-11-15 (×4): 237 mL via ORAL

## 2013-11-13 MED ORDER — VITAMIN B-1 100 MG PO TABS
100.0000 mg | ORAL_TABLET | Freq: Every day | ORAL | Status: DC
  Administered 2013-11-14 – 2013-11-15 (×2): 100 mg via ORAL
  Filled 2013-11-13 (×3): qty 1

## 2013-11-13 MED ORDER — FUROSEMIDE 10 MG/ML IJ SOLN
40.0000 mg | Freq: Once | INTRAMUSCULAR | Status: AC
  Administered 2013-11-13: 40 mg via INTRAVENOUS
  Filled 2013-11-13: qty 4

## 2013-11-13 NOTE — Progress Notes (Signed)
NUTRITION FOLLOW UP  Intervention:  1.  General healthful diet; encourage intake of foods and beverages as able.  RD to follow and assess for nutritional adequacy.  2. Supplements; Ensure Complete po BID, each supplement provides 350 kcal and 13 grams of protein  Nutrition Dx:   Inadequate oral intake, ongoing  Monitor:   1.  Food/Beverage; pt meeting >/=90% estimated needs with tolerance. 2.  Wt/wt change; monitor trends 3.  Enteral nutrition; initiation with tolerance.  Pt to meet >/=90% estimated needs with nutrition support. No longer appropriate, discontinue.  Assessment:   Pt admitted with hematemesis. Pt found at home with bright red blood in vomit and stool with AMS. Tox screen is positive for cocaine.   Pt has been extubated, however is somnolent. Diet advanced yesterday.  Intake is poor @ 10% of meals.  Pt sleeping soundly at time of visit.  Does not awaken to gentle touch.   Nutrition Focused Physical Exam: Subcutaneous Fat:  Orbital Region: mild wasting Upper Arm Region: WNL Thoracic and Lumbar Region: WNL  Muscle:  Temple Region: WNL Clavicle Bone Region: mild wasting Clavicle and Acromion Bone Region: not assessed Scapular Bone Region: not assessed Dorsal Hand: WNL Patellar Region: WNL Anterior Thigh Region: edema present Posterior Calf Region: WNL  Edema: present in thighs  RD will order supplements for pt and continue to monitor for ongoing nutrition-related interventions.    Height: Ht Readings from Last 1 Encounters:  11/08/13 5' 8.9" (1.75 m)    Weight Status:   Wt Readings from Last 1 Encounters:  11/09/13 199 lb 8.3 oz (90.5 kg)    Re-estimated needs:  Kcal: 2030-2320 Protein: 87-105g Fluid: >2.0 L/day  Skin: Intact Edema to thighs  Diet Order: Carb Control   Intake/Output Summary (Last 24 hours) at 11/13/13 1005 Last data filed at 11/13/13 0900  Gross per 24 hour  Intake 1913.75 ml  Output    200 ml  Net 1713.75 ml    Last  BM: 1/10  Labs:   Recent Labs Lab 11/11/13 0245 11/12/13 0300 11/13/13 0220  NA 144 147 143  K 4.1 4.7 3.6*  CL 116* 119* 114*  CO2 19 16* 18*  BUN 36* 30* 26*  CREATININE 1.00 0.80 1.11  CALCIUM 7.4* 7.6* 7.5*  GLUCOSE 107* 101* 89    CBG (last 3)   Recent Labs  11/12/13 1114 11/12/13 1615 11/13/13 0803  GLUCAP 92 81 82    Scheduled Meds: . antiseptic oral rinse  15 mL Mouth Rinse BID  . [START ON 11/14/2013] influenza vac split quadrivalent PF  0.5 mL Intramuscular Tomorrow-1000  . pantoprazole  40 mg Oral BID AC  . QUEtiapine  100 mg Oral BID  . sodium chloride  3 mL Intravenous Q12H  . thiamine  100 mg Intravenous Daily    Continuous Infusions: . dextrose 75 mL (11/12/13 2017)    Brynda Greathouse, MS RD LDN Clinical Inpatient Dietitian Pager: [phone] Weekend/After hours pager: [phone]

## 2013-11-13 NOTE — Progress Notes (Signed)
[place] Progress Note Patient Name: Oscar Swanson DOB: [birth year] MRN: [id number]  Date of Service  11/13/2013   HPI/Events of Note   Pt oliguric  eICU Interventions  Lasix ordered    Intervention Category Intermediate Interventions: Oliguria - evaluation and management  Asencion Noble 11/13/2013, 7:51 PM

## 2013-11-13 NOTE — Progress Notes (Addendum)
PULMONARY / CRITICAL CARE MEDICINE  Name: Oscar Swanson MRN: [id number] DOB: Aug 19, 1961    ADMISSION DATE:  11/07/2013 CONSULTATION DATE:  11/08/2013  REFERRING MD :  EDP PRIMARY SERVICE:  PCCM  CHIEF COMPLAINT:  Acute GI hemorrhage  BRIEF PATIENT DESCRIPTION: 53 year old male w/ h/o schizophrenia (not on meds) and ETOH, admitted on 1/8 w/ acute upper GI bleed and acute agitated encephalopathy. Agitation was treated w/ lorazepam and Haldol. Prior to ENDO was sedated but stable awaiting EGD. Developed acute respiratory distress in Endo holding awaiting EGD on 1/9, emergently intubated.  SIGNIFICANT EVENTS / STUDIES:  1/8    Admitted w/ acute encephalopathy and UGIB, treated w/ PPI gtt, transfusion and sedation w/ plan for EGD  1/9    Dveloped acute resp distress. Required emergent intubation by anesthesia. Initially intubated esophagus w/ large vol bloody gastric output. Moved to ICU for stabilization.  1/9    EGD Ardis Hughs) >>> There was old blood clot and hematin in the stomach. There were two trunks of medium sized varices, no obvious recent site of bleeding. 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 The blood clot in proximal stomach precluded complete view of the stomach and so perhaps there was offending site underlying. I chose to band ligate the esopahgeal varices (5 bands placed in good position). The examination was otherwise normal.  1/8    CT head >>> negative  1/10  CT Abdomen >>>  Hepatic cirrhosis and evidence of portal hypertension with mild splenomegaly, moderate ascites, and gastroesophageal varices. Cholelithiasis. Marked gallbladder wall thickening/edema which can be seen in patients with hepatic cirrhosis 1/12 RASS -1 on dexmedetomidine infusion. + F/C. Passed SBT. Extubated and tolerating but intermittently combative. Dex infusion restarted 1/13 Agitated off dexmedetomidine.  Somnolent on dex. Seroquel initiated.  1/14 Calm off dexmedetomidine. Transfer  to med-surg floor. TRH to assume care and PCCM s/o  LINES / TUBES: ETT 1/9 >> 1/12 R [place] CVL 1/9 >> 1/10  CULTURES: 1/2  MRSA PCR >>> neg 1/9 Urine >>> neg 1/9 Respiratory >> moraxella  ANTIBIOTICS: Unasyn 1/9 >> 1/13 Ciprofloxacin 1/10  >> 1/12  INTERVAL HISTORY:  RASS 0. Calm. + F/C. Poorly oriented   VITAL SIGNS: Temp:  [97.2 F (36.2 C)-99 F (37.2 C)] 99 F (37.2 C) (01/14 1207) Pulse Rate:  [76-94] 76 (01/14 1207) Resp:  [11-25] 15 (01/14 1207) BP: (84-130)/(33-81) 123/44 mmHg (01/14 1207) SpO2:  [93 %-97 %] 94 % (01/14 1207)  HEMODYNAMICS:    VENTILATOR SETTINGS:    INTAKE / OUTPUT: Intake/Output     01/13 0701 - 01/14 0700 01/14 0701 - 01/15 0700   P.O. 600 720   I.V. (mL/kg) 1069.6 (11.8) 468.3 (5.2)   IV Piggyback 100    Total Intake(mL/kg) 1769.6 (19.6) 1188.3 (13.1)   Urine (mL/kg/hr) 350 (0.2)    Total Output 350     Net +1419.6 +1188.3        Urine Occurrence 1 x      PHYSICAL EXAMINATION: General: + F/C Neuro:  MAEs HEENT: WNL Cardiovascular:  RRR, no m/r/g Lungs:  Bilateral diminished air entry, no w/r/r Abdomen:  Soft, nontender, bowel sounds diminished Musculoskeletal:  No edema Skin:  Intact  LABS: I have reviewed all of today's lab results. Relevant abnormalities are discussed in the A/P section  CXR:  NNF  ASSESSMENT / PLAN:  PULMONARY A:  Acute respiratory failure, resolved P:   Cont to monitor  CARDIOVASCULAR A:  Shock, resolved. P:  Monitor  RENAL A:   Hypernatremia, resolved P:   Monitor BMET intermittently Correct electrolytes as indicated Cont free water repletion - decreased 1/13  GASTROINTESTINAL A:  Acute upper GI hemorrhage, resolved Non bleeding varices s/p ligation. Clot in proximal stomach, suspected underlying ulcer. Elevated transaminases - improving Alcoholic cirrhosis. P:   GI/CCS following Advance diet Cont PPI  Trend LFT intermittently  HEMATOLOGIC A:   Acute blood loss anemia,  no further bleeding Coagulopathy. Thrombocytopenia, likely hypersplenism Macrocytosis P:  Monitor CBC and coags intermittently Transfuse per ICU guidelines Folate initiated 1/14  INFECTIOUS A:   Doubt aspiration pneumonia. Possible acute cholecystitis - clinically resolved P:   Cultures and antibiotics as above  ENDOCRINE  A:  H/O DM II without hyperglycemia currently P:   Cont AC CBGs Resume SSI for glu > 180  NEUROLOGIC A:   Acute encephalopathy, improving Alcohol abuse Cocaine abuse Schizophrenia not on treatment PTA   P:   Cont quetiapine initiated 1/12 Cont PRN Haldol   Transfer to med-surg. TRH to assume care as of 1/15 and PCCM will sign off  Merton Border, MD Pulmonary and [place] Pager: [phone]  11/13/2013, 2:19 PM

## 2013-11-13 NOTE — Progress Notes (Signed)
Daily Rounding Note  11/13/2013, 11:06 AM  LOS: 6 days   SUBJECTIVE:       Much more alert today.  Seroquel is helping with agitation/delerium.  Ate well of his regular diet at breakfast today.  No nausea.  No abdominal pain.  Says he was told in past he had hepatitis C.   OBJECTIVE:         Vital signs in last 24 hours:    Temp:  [97.1 F (36.2 C)-98.7 F (37.1 C)] 98.3 F (36.8 C) (01/14 0816) Pulse Rate:  [94] 94 (01/14 0816) Resp:  [11-25] 12 (01/14 1000) BP: (84-130)/(33-81) 120/49 mmHg (01/14 1000) SpO2:  [93 %-97 %] 96 % (01/14 1000) Last BM Date: 11/09/13 General: speech difficult to understand, alert, comfortable,    Heart: RRR Chest: clear.  No cough or labored breathing Abdomen: soft, distended, active BS, NT  Extremities: some edema in LE and in arms Neuro/Psych:  Cooperative, oriented to self, hospital.  Appropriate.   Intake/Output from previous day: 01/13 0701 - 01/14 0700 In: 1769.6 [P.O.:600; I.V.:1069.6; IV Piggyback:100] Out: 350 [Urine:350]  Intake/Output this shift: Total I/O In: 585 [P.O.:360; I.V.:225] Out: -   Lab Results:  Recent Labs  11/11/13 0245 11/12/13 0300  WBC 5.4 3.7*  HGB 9.1* 9.4*  HCT 26.6* 28.3*  PLT 49* 49*   BMET  Recent Labs  11/11/13 0245 11/12/13 0300 11/13/13 0220  NA 144 147 143  K 4.1 4.7 3.6*  CL 116* 119* 114*  CO2 19 16* 18*  GLUCOSE 107* 101* 89  BUN 36* 30* 26*  CREATININE 1.00 0.80 1.11  CALCIUM 7.4* 7.6* 7.5*   LFT  Recent Labs  11/12/13 0300  PROT 5.1*  ALBUMIN 2.0*  AST 47*  ALT 43  ALKPHOS 89  BILITOT 1.3*   PT/INR No results found for this basename: LABPROT, INR,  in the last 72 hours Hepatitis Panel No results found for this basename: HEPBSAG, HCVAB, HEPAIGM, HEPBIGM,  in the last 72 hours  Studies/Results: Dg Chest Port 1 View  11/12/2013   CLINICAL DATA:  Respiratory failure.  EXAM: PORTABLE CHEST - 1 VIEW   COMPARISON:  11/10/2013  FINDINGS: Endotracheal tube has been removed. Mild bibasilar airspace disease is unchanged. No effusion or edema.  IMPRESSION: Mild bibasilar airspace disease is unchanged.  Interval extubation.   Electronically Signed   By: Franchot Gallo M.D.   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 Blood clot in proximal stomach precluded complete view of the stomach and so perhaps there was offending site underlying. Unasyn should cover for SBP prophylaxis.  * New diagnoses of cirrhosis. Moderate ascites.  LFTs improved. Completed octreotide drip and PPI drip. Now on BID IV Protonix. HCV Ab is reactive.  Noted to be Hep C + on 2003 psych note.  Sister did not endorse heavy use of ETOH.  * ABL anemia. S/p 2 PRBCs. Hgb stable.  * General pancytopenia with leukopenia, thrombocytopenia, anemia.  * GB wall thickening, cholelithiasis. HIDA scan cancelled.  * Dilated loop of jejunum on CT, no bowel obstruction on follow up xray. Ascending colitis, ? Internal hernia on CT scan.  * Encephalopathy, mild elevation of ammonia to 81. Not convinced this is hepatic encephalopathy. Acute delerium, agitation overnight. Treated with haldol  3  decade history of schizophrenia, UDS + for cocaine on admission.  * Coagulopathy. S/p vitamin K and 2 FFP    PLAN   *   ? How long to continue unasyn? *  Team to transfer pt to floor today.    Azucena Freed  11/13/2013, 11:06 AM Pager: [phone]    Attending physician's note   I have taken an interval history, reviewed the chart and examined the patient. I agree with the Advanced Practitioner's note, impression and recommendations. Mental status, agitation significantly improved. No recurrent GI bleeding. Can DC prophylactic Unasyn, if ok with CC team, as it has been 5 days since acute bleed. Should have  repeat EGD and variceal banding in a few weeks as outpatient with Dr. Ardis Hughs. Areas of stomach obscured by blood at initial EGD can be evaluated at that time. GI signing off.   Pricilla Riffle. Fuller Plan, MD [facility]

## 2013-11-14 LAB — BASIC METABOLIC PANEL
BUN: 19 mg/dL (ref 6–23)
CO2: 20 mEq/L (ref 19–32)
Calcium: 7.7 mg/dL — ABNORMAL LOW (ref 8.4–10.5)
Chloride: 108 mEq/L (ref 96–112)
Creatinine, Ser: 1.08 mg/dL (ref 0.50–1.35)
GFR calc Af Amer: 89 mL/min — ABNORMAL LOW (ref >90)
GFR, EST NON AFRICAN AMERICAN: 77 mL/min — AB (ref >90)
Glucose, Bld: 103 mg/dL — ABNORMAL HIGH (ref 70–99)
POTASSIUM: 3.5 meq/L — AB (ref 3.7–5.3)
SODIUM: 140 meq/L (ref 137–147)

## 2013-11-14 LAB — GLUCOSE, CAPILLARY
GLUCOSE-CAPILLARY: 198 mg/dL — AB (ref 70–99)
Glucose-Capillary: 105 mg/dL — ABNORMAL HIGH (ref 70–99)
Glucose-Capillary: 124 mg/dL — ABNORMAL HIGH (ref 70–99)

## 2013-11-14 LAB — CBC
HCT: 29 % — ABNORMAL LOW (ref 39.0–52.0)
HEMOGLOBIN: 9.9 g/dL — AB (ref 13.0–17.0)
MCH: 34.1 pg — ABNORMAL HIGH (ref 26.0–34.0)
MCHC: 34.1 g/dL (ref 30.0–36.0)
MCV: 100 fL (ref 78.0–100.0)
PLATELETS: 47 10*3/uL — AB (ref 150–400)
RBC: 2.9 MIL/uL — AB (ref 4.22–5.81)
RDW: 18.7 % — ABNORMAL HIGH (ref 11.5–15.5)
WBC: 5.9 10*3/uL (ref 4.0–10.5)

## 2013-11-14 MED ORDER — HYDROCODONE-ACETAMINOPHEN 5-325 MG PO TABS
1.0000 | ORAL_TABLET | ORAL | Status: DC | PRN
  Administered 2013-11-15: 1 via ORAL
  Filled 2013-11-14 (×2): qty 1

## 2013-11-14 MED ORDER — ZOLPIDEM TARTRATE 5 MG PO TABS
5.0000 mg | ORAL_TABLET | Freq: Once | ORAL | Status: AC
  Administered 2013-11-14: 5 mg via ORAL
  Filled 2013-11-14: qty 1

## 2013-11-14 MED ORDER — POTASSIUM CHLORIDE CRYS ER 20 MEQ PO TBCR
40.0000 meq | EXTENDED_RELEASE_TABLET | Freq: Once | ORAL | Status: AC
  Administered 2013-11-14: 40 meq via ORAL
  Filled 2013-11-14: qty 2

## 2013-11-14 NOTE — Evaluation (Signed)
Physical Therapy Evaluation Patient Details Name: Oscar Swanson MRN: [id number] DOB: 20-May-1961 Today's Date: 11/14/2013 Time: 5638-7564 PT Time Calculation (min): 19 min  PT Assessment / Plan / Recommendation History of Present Illness  53 year old male w/ h/o schizophrenia (not on meds) and ETOH, admitted on 1/8 w/ acute upper GI bleed and acute agitated encephalopathy. Agitation was treated w/ lorazepam and Haldol. Prior to ENDO was sedated but stable awaiting EGD. Developed acute respiratory distress in Endo holding awaiting EGD on 1/9, emergently intubated. ETT 1/9-1/12. schizophrenia, encephalopathy  Clinical Impression  Pt very pleasant and oriented to place not time. Pt demonstrates balance and safety awareness deficits with improved gait with use of Rw. REcommend RW for home and for mobility acutely. Will follow to maximize independence and gait and recommend daily ambulation with nursing assist. Pt endorses 3 falls this year due to back or legs giving out.    PT Assessment  Patient needs continued PT services    Follow Up Recommendations  Home health PT;Supervision - Intermittent    Does the patient have the potential to tolerate intense rehabilitation      Barriers to Discharge Decreased caregiver support      Equipment Recommendations  Rolling walker with 5" wheels    Recommendations for Other Services     Frequency Min 3X/week    Precautions / Restrictions Precautions Precautions: Fall Restrictions Weight Bearing Restrictions: No   Pertinent Vitals/Pain No pain HR 76      Mobility  Bed Mobility Overal bed mobility: Modified Independent Transfers Overall transfer level: Needs assistance Transfers: Sit to/from Stand Sit to Stand: Supervision General transfer comment: cueing for hand placement and safety from bed and to chair Ambulation/Gait Ambulation/Gait assistance: Min guard Ambulation Distance (Feet): 500 Feet Assistive device: Rolling walker (2  wheeled) Gait Pattern/deviations: Step-through pattern;Decreased stride length Gait velocity interpretation: Below normal speed for age/gender General Gait Details: decreased steadiness with gait with improvement with use of RW but required cues to stay inside RW and for directions    Exercises     PT Diagnosis: Difficulty walking  PT Problem List: Decreased cognition;Decreased activity tolerance;Decreased safety awareness;Decreased knowledge of use of DME;Decreased balance;Decreased mobility PT Treatment Interventions: Gait training;DME instruction;Balance training;Functional mobility training;Patient/family education;Therapeutic activities     PT Goals(Current goals can be found in the care plan section) Acute Rehab PT Goals Patient Stated Goal: return home PT Goal Formulation: With patient Time For Goal Achievement: 11/28/13 Potential to Achieve Goals: Good  Visit Information  Last PT Received On: 11/14/13 Assistance Needed: +1 History of Present Illness: 53 year old male w/ h/o schizophrenia (not on meds) and ETOH, admitted on 1/8 w/ acute upper GI bleed and acute agitated encephalopathy. Agitation was treated w/ lorazepam and Haldol. Prior to ENDO was sedated but stable awaiting EGD. Developed acute respiratory distress in Endo holding awaiting EGD on 1/9, emergently intubated. ETT 1/9-1/12. schizophrenia, encephalopathy       Prior [place] expects to be discharged to:: Private residence Living Arrangements: Alone Available Help at Discharge: Family;Available PRN/intermittently Type of Home: Apartment Home Access: Level entry Home Layout: One level Home Equipment: Cane - single point;Shower seat Prior Function Level of Independence: Independent Comments: sister drives him to the grocery store and for appointments Communication Communication: No difficulties    Cognition  Cognition Arousal/Alertness: Awake/alert Behavior During Therapy:  WFL for tasks assessed/performed Overall Cognitive Status: No family/caregiver present to determine baseline cognitive functioning    Extremity/Trunk Assessment Upper  Extremity Assessment Upper Extremity Assessment: Overall WFL for tasks assessed Lower Extremity Assessment Lower Extremity Assessment: Overall WFL for tasks assessed Cervical / Trunk Assessment Cervical / Trunk Assessment: Normal   Balance    End of Session PT - End of Session Equipment Utilized During Treatment: Gait belt Activity Tolerance: Patient tolerated treatment well Patient left: in chair;with call bell/phone within reach Nurse Communication: Mobility status  GP     Lanetta Inch Beth 11/14/2013, 10:10 AM Elwyn Reach, [place]

## 2013-11-14 NOTE — Progress Notes (Addendum)
[facility] Progress Note    Oscar Swanson [id number] DOB: 03/08/1961 DOA: 11/07/2013 PCP: Angelica Chessman, MD  Brief narrative: 53 year old male with apparent history of prior alcohol abuse and schizophrenia who lives at home with his sister. Was brought to the ER because of confusion, agitation and at time of admission he was unable to give history. According to his family and had several episodes of hematemesis and had vomited a moderate amount of bright red blood with clots. He was also having melanotic stool. In the ER he was agitated enough to require restraints as well as administration of Ativan and Haldol. Unknown how much alcohol the patient utilizes. In the ER the patient had sinus tachycardia with rates in the 120s, BP was stable but 120/70. Hemoglobin stable at 10, BUN mildly elevated at 33 with a normal creatinine. His ammonia level was 75 and his INR was mildly elevated at 1.6. Urine drug screen was positive for cocaine. GI had artery been consulted by the emergency room physician.  After admission the patient was taken to the endoscopy lab but the procedure could not be performed initially because the patient developed acute respiratory distress. During intubation attempts the esophagus was inadvertently intubated with copious amounts of melena or bright red blood obtained. The patient was is currently successfully intubated and sent to the ICU under PCCM.  Once the patient stabilized he underwent an EGD that revealed varices and portal gastropathy. He underwent a banding procedure. Initial CT the abdomen showed gallbladder wall edema concerning for possible cholecystitis which was likely related to patient's underlying cirrhosis. Surgery was consulted and did not feel the patient had cholecystitis.  Patient did require Precedex infusion which was weaned and the patient was successfully extubated but unfortunately had intermittent agitation and combative behaviors so  low-dose Precedex was resumed. With the addition of Seroquel patient was successfully weaned from the Precedex and agitated and combative behaviors have resolved.  Patient stabilized and was deemed appropriate to transfer to the general medical floor on 11/13/2013  SIGNIFICANT EVENTS / STUDIES:  1/8 Admitted w/ acute encephalopathy and UGIB, treated w/ PPI gtt, transfusion and sedation w/ plan for EGD  1/9 Dveloped acute resp distress. Required emergent intubation by anesthesia. Initially intubated esophagus w/ large vol bloody gastric output. Moved to ICU for stabilization.  1/9 EGD Ardis Hughs) >>> There was old blood clot and hematin in the stomach. There were two trunks of medium sized varices, no obvious recent site of bleeding. 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 The blood clot in proximal stomach precluded complete view of the stomach and so perhaps there was offending site underlying. I chose to band ligate the esopahgeal varices (5 bands placed in good position). The examination was otherwise normal.  1/8 CT head >>> negative  1/10 CT Abdomen >>> Hepatic cirrhosis and evidence of portal hypertension with mild splenomegaly, moderate ascites, and gastroesophageal varices. Cholelithiasis. Marked gallbladder wall thickening/edema which can be seen in patients with hepatic cirrhosis  1/12 RASS -1 on dexmedetomidine infusion. + F/C. Passed SBT. Extubated and tolerating but intermittently combative. Dex infusion restarted  1/13 Agitated off dexmedetomidine. Somnolent on dex. Seroquel initiated.    Assessment/Plan:  Acute hypoxic resp failure/community-acquired pneumonia -onset in endo prior to procedure- required intubation to protect airway -no evidence of asp PNA subsequently  -stable on [place] -Respiratory culture was positive for Moraxella-antibiotics have been completed  Encephalopathy/agitation/Schizophrenia -Resolved -Initial ammonia level 75- Gi not convinced  this was the cause of the  encephalopathy -Agitation symptoms markedly improved with the addition of Seroquel   Hypokalemia -Oral replete  Acute upper GI bleed - underwent EGD 11/08/2013 that revealed severe portal gastropathy and medium-sized varices and blood clot thought to be lying on an ulcer --5 esophageal bands were placed -Continue oral PPI-  takes Alleve for back pain- advised to stop NSAIDs for now -  will need repeat EGD in few wks for further banding  Dehydration/Tachycardia -Patient currently 11 L positive but appeared to be quite dry presentation -Does not appear to have respiratory symptoms consistent with volume overload and no significant peripheral edema -Saline lock IV fluids  ? Diabetes -Hemoglobin A1c was 4.06 September 2013 -not a diabetic  Cirrhosis with alcoholism/Mild Coagulopathy/Thrombocytopenia -CIWA initiated at time of admission and currently has been completed (see above regarding Precedex) -Was given FFP after admission to reverse INR proceed with endoscopy -Platelets stable of 40-50,000 range  Smoking  Cocaine abuse   DVT prophylaxis: SCDs in setting of GI bleeding Code Status: Full Family Communication: No family at bedside Disposition Plan/Expected LOS: Transfer to floor   Consultants: Gastroenterology General surgery  CULTURES:  1/2 MRSA PCR >>> neg  1/9 Urine >>> neg  1/9 Respiratory >> moraxella  Antibiotics: Unasyn 1/9 >> 1/13  Ciprofloxacin 1/10 >> 1/12   HPI/Subjective: Alert in no longer agitated. Does seem a little confused. No complaints verbalized  Objective: Blood pressure 148/78, pulse 70, temperature 98.2 F (36.8 C), temperature source Oral, resp. rate 19, height 5' 8.9" (1.75 m), weight 199 lb 8.3 oz (90.5 kg), SpO2 98.00%.  Intake/Output Summary (Last 24 hours) at 11/14/13 1129 Last data filed at 11/14/13 0900  Gross per 24 hour  Intake 1698.33 ml  Output   2450 ml  Net -751.67 ml   Exam: General: No  acute respiratory distress Lungs: Clear to auscultation bilaterally without wheezes or crackles, RA  Cardiovascular: Regular rate and rhythm without murmur gallop or rub normal S1 and S2, trace peripheral edema or JVD Abdomen: Nontender, nondistended, soft, bowel sounds positive, no rebound, ??  ascites, no appreciable mass Musculoskeletal: No significant cyanosis, clubbing of bilateral lower extremities Neurological: Lethargic but no apparent focal neurological deficit  Scheduled Meds:  Scheduled Meds: . antiseptic oral rinse  15 mL Mouth Rinse BID  . feeding supplement (ENSURE COMPLETE)  237 mL Oral BID BM  . folic acid  1 mg Oral Daily  . pantoprazole  40 mg Oral Daily  . QUEtiapine  100 mg Oral BID  . sodium chloride  3 mL Intravenous Q12H  . thiamine  100 mg Oral Daily   Continuous Infusions:   Data Reviewed: Basic Metabolic Panel:  Recent Labs Lab 11/10/13 0308 11/11/13 0245 11/12/13 0300 11/13/13 0220 11/14/13 0250  NA 145 144 147 143 140  K 4.1 4.1 4.7 3.6* 3.5*  CL 115* 116* 119* 114* 108  CO2 21 19 16* 18* 20  GLUCOSE 116* 107* 101* 89 103*  BUN 38* 36* 30* 26* 19  CREATININE 0.96 1.00 0.80 1.11 1.08  CALCIUM 7.6* 7.4* 7.6* 7.5* 7.7*   Liver Function Tests:  Recent Labs Lab 11/07/13 1927 11/09/13 0441 11/10/13 0308 11/12/13 0300  AST 86* 56* 49* 47*  ALT 78* 58* 51 43  ALKPHOS 120* 113 97 89  BILITOT 1.3* 2.5* 1.3* 1.3*  PROT 6.2 5.7* 5.5* 5.1*  ALBUMIN 2.6* 2.4* 2.3* 2.0*    Recent Labs Lab 11/07/13 1927  LIPASE 41    Recent Labs Lab 11/07/13 1927 11/09/13 0445  AMMONIA 79* 81*   CBC:  Recent Labs Lab 11/07/13 1927  11/09/13 1415 11/10/13 0308 11/11/13 0245 11/12/13 0300 11/14/13 0250  WBC 13.6*  < > 11.7* 11.3* 5.4 3.7* 5.9  NEUTROABS 9.1*  --   --   --   --   --   --   HGB 10.9*  < > 9.0* 9.9* 9.1* 9.4* 9.9*  HCT 31.9*  < > 25.7* 28.6* 26.6* 28.3* 29.0*  MCV 98.5  < > 96.6 99.0 101.5* 101.4* 100.0  PLT 84*  < > 53* 53* 49*  49* 47*  < > = values in this interval not displayed. Cardiac Enzymes: No results found for this basename: CKTOTAL, CKMB, CKMBINDEX, TROPONINI,  in the last 168 hours BNP (last 3 results) No results found for this basename: PROBNP,  in the last 8760 hours CBG:  Recent Labs Lab 11/12/13 1114 11/12/13 1615 11/13/13 0803 11/13/13 1133 11/13/13 1611  GLUCAP 92 81 82 88 151*    Recent Results (from the past 240 hour(s))  URINE CULTURE     Status: None   Collection Time    11/07/13  8:20 PM      Result Value Range Status   Specimen Description URINE, CATHETERIZED   Final   Special Requests NONE   Final   Culture  Setup Time     Final   Value: 11/08/2013 11:23     Performed at [place]     Final   Value: NO GROWTH     Performed at [facility]   Culture     Final   Value: NO GROWTH     Performed at [facility]   Report Status 11/09/2013 FINAL   Final  MRSA PCR SCREENING     Status: None   Collection Time    11/07/13 11:26 PM      Result Value Range Status   MRSA by PCR NEGATIVE  NEGATIVE Final   Comment:            The GeneXpert MRSA Assay (FDA     approved for NASAL specimens     only), is one component of a     comprehensive MRSA colonization     surveillance program. It is not     intended to diagnose MRSA     infection nor to guide or     monitor treatment for     MRSA infections.  CULTURE, RESPIRATORY (NON-EXPECTORATED)     Status: None   Collection Time    11/08/13  5:41 PM      Result Value Range Status   Specimen Description TRACHEAL ASPIRATE   Final   Special Requests NONE   Final   Gram Stain     Final   Value: MODERATE WBC PRESENT,BOTH PMN AND MONONUCLEAR     NO SQUAMOUS EPITHELIAL CELLS SEEN     ABUNDANT GRAM NEGATIVE COCCI     MODERATE GRAM POSITIVE COCCI IN PAIRS     RARE GRAM NEGATIVE RODS   Culture     Final   Value: MODERATE MORAXELLA CATARRHALIS(BRANHAMELLA)     Note: BETA LACTAMASE POSITIVE      Performed at [facility]   Report Status 11/11/2013 FINAL   Final     Studies:  Recent x-ray studies have been reviewed in detail by the Attending Physician     Erin Hearing, ANP [facility] Office  [phone] Pager [phone]  **If unable to  reach the above provider after paging please contact the [place] @ [phone]  On-Call/Text Page:      [email or web address]      password [email or web address]  If 7PM-7AM, please contact night-coverage [email or web address] Password [email or web address] 11/14/2013, 11:29 AM   LOS: 7 days     I have examined the patient, reviewed the chart and modified the above note which I agree with.   Rosalia Mcavoy,MD [phone] 11/14/2013, 5:18 PM

## 2013-11-14 NOTE — Progress Notes (Signed)
Report called to [facility]. Pt stable, v/s WNL-see flowsheet documentation. Pt transported via [place] by nurse tech to [facility].

## 2013-11-14 NOTE — Plan of Care (Signed)
K 3.5 - oral replete.  Erin Hearing, ANP

## 2013-11-15 DIAGNOSIS — I8501 Esophageal varices with bleeding: Secondary | ICD-10-CM

## 2013-11-15 DIAGNOSIS — E119 Type 2 diabetes mellitus without complications: Secondary | ICD-10-CM

## 2013-11-15 DIAGNOSIS — K7682 Hepatic encephalopathy: Secondary | ICD-10-CM

## 2013-11-15 DIAGNOSIS — K7290 Hepatic failure, unspecified without coma: Secondary | ICD-10-CM

## 2013-11-15 DIAGNOSIS — K922 Gastrointestinal hemorrhage, unspecified: Secondary | ICD-10-CM

## 2013-11-15 DIAGNOSIS — J9600 Acute respiratory failure, unspecified whether with hypoxia or hypercapnia: Secondary | ICD-10-CM

## 2013-11-15 DIAGNOSIS — F209 Schizophrenia, unspecified: Secondary | ICD-10-CM

## 2013-11-15 LAB — GLUCOSE, CAPILLARY
Glucose-Capillary: 92 mg/dL (ref 70–99)
Glucose-Capillary: 133 mg/dL — ABNORMAL HIGH (ref 70–99)

## 2013-11-15 LAB — COMPREHENSIVE METABOLIC PANEL
ALBUMIN: 2.4 g/dL — AB (ref 3.5–5.2)
ALT: 55 U/L — AB (ref 0–53)
AST: 66 U/L — ABNORMAL HIGH (ref 0–37)
Alkaline Phosphatase: 108 U/L (ref 39–117)
BUN: 15 mg/dL (ref 6–23)
CO2: 20 mEq/L (ref 19–32)
Calcium: 8.1 mg/dL — ABNORMAL LOW (ref 8.4–10.5)
Chloride: 110 mEq/L (ref 96–112)
Creatinine, Ser: 0.88 mg/dL (ref 0.50–1.35)
GFR calc Af Amer: >90 mL/min (ref >90)
GFR calc non Af Amer: >90 mL/min (ref >90)
GLUCOSE: 102 mg/dL — AB (ref 70–99)
POTASSIUM: 4 meq/L (ref 3.7–5.3)
SODIUM: 142 meq/L (ref 137–147)
TOTAL PROTEIN: 5.9 g/dL — AB (ref 6.0–8.3)
Total Bilirubin: 1.1 mg/dL (ref 0.3–1.2)

## 2013-11-15 LAB — CBC
HCT: 30.4 % — ABNORMAL LOW (ref 39.0–52.0)
HEMOGLOBIN: 10.3 g/dL — AB (ref 13.0–17.0)
MCH: 33.9 pg (ref 26.0–34.0)
MCHC: 33.9 g/dL (ref 30.0–36.0)
MCV: 100 fL (ref 78.0–100.0)
PLATELETS: 55 10*3/uL — AB (ref 150–400)
RBC: 3.04 MIL/uL — ABNORMAL LOW (ref 4.22–5.81)
RDW: 18.7 % — AB (ref 11.5–15.5)
WBC: 5.7 10*3/uL (ref 4.0–10.5)

## 2013-11-15 MED ORDER — BECLOMETHASONE DIPROPIONATE 80 MCG/ACT IN AERS: 1.0000 | INHALATION_SPRAY | Freq: Two times a day (BID) | RESPIRATORY_TRACT | Status: DC

## 2013-11-15 MED ORDER — FOLIC ACID 1 MG PO TABS: 1.0000 mg | ORAL_TABLET | Freq: Every day | ORAL | Status: DC

## 2013-11-15 MED ORDER — QUETIAPINE FUMARATE 100 MG PO TABS: 50.0000 mg | ORAL_TABLET | Freq: Every day | ORAL | Status: DC

## 2013-11-15 MED ORDER — PANTOPRAZOLE SODIUM 40 MG PO TBEC: 40.0000 mg | DELAYED_RELEASE_TABLET | Freq: Two times a day (BID) | ORAL | Status: DC

## 2013-11-15 MED ORDER — THIAMINE HCL 100 MG PO TABS: 100.0000 mg | ORAL_TABLET | Freq: Every day | ORAL | Status: DC

## 2013-11-15 NOTE — Progress Notes (Signed)
[facility] called to notify nursing staff that patient was found walking down [address] and that pt is currently inside their facility. Security notified to retrieve pt. Security returned call soon thereafter and apparently pt is refusing to return to room. No discharge paperwork/prescriptions, etc have been given. Attempted to notify sister of incident, but no answer.

## 2013-11-15 NOTE — Progress Notes (Signed)
Attempted again to call sister. No answer. Will mail pt's discharge instructions to his home address.

## 2013-11-15 NOTE — Discharge Summary (Signed)
Physician Discharge Summary  Oscar Swanson [id number] DOB: 04/08/1961 DOA: 11/07/2013  PCP: Oscar Chessman, MD  Admit date: 11/07/2013 Discharge date: 11/15/2013  Time spent: 30 minutes  Recommendations for Outpatient Follow-up:  1. Follow up with the gastroenterologist as directed 2. Please call the [facility] to schedule an appointment to be seen in next 2 weeks 3. No NSAIDs !!! 4. Stop alcohol !!!   ** Prior to patient actually being discharged by the staff he left the nursing unit without notifying the staff: [facility] called to notify nursing staff that patient was found walking down [address] and that pt is currently inside their facility. Security notified to retrieve pt. Security returned call soon thereafter and apparently pt is refusing to return to room. No discharge paperwork/prescriptions, etc have been given. Attempted to notify sister of incident, but no answer.  Discharge Diagnoses:  Principal Problem:    Acute respiratory failure 2/2 CAP- resolved Active Problems:   ? Hepatic encephalopathy -determined not hepatic etiology   Acute upper GI bleed/ Esophageal varices post banding/portal gastropathy   Dehydration   Tachycardia   ? 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 Was brought to the ER because of confusion, agitation and at time of admission he was unable to give history. According to his family and had several episodes of hematemesis and had vomited a  moderate amount of bright red blood with clots. He was also having melanotic stool. In the ER he was agitated enough to require restraints as well as administration of Ativan and Haldol. Unknown how much alcohol the patient utilizes. In the ER the patient had sinus tachycardia with rates in the 120s, BP was stable but 120/70. Hemoglobin stable at 10, BUN mildly elevated at 33 with a normal creatinine. His ammonia level was 75 and his INR was mildly elevated at 1.6. Urine drug screen was positive for cocaine. GI had artery been consulted by the emergency room physician.   After admission the patient was taken to the endoscopy lab but the procedure could not be performed initially because the patient developed acute respiratory distress. During intubation attempts the esophagus was inadvertently intubated with copious amounts of melena or bright red blood obtained. The patient was is currently successfully intubated and sent to the ICU under PCCM.   Once the patient stabilized he underwent an EGD that revealed varices and portal gastropathy. He underwent a banding procedure. Initial CT the abdomen showed gallbladder wall edema concerning for possible cholecystitis which was likely related to patient's underlying cirrhosis. Surgery was consulted and did not feel the patient had cholecystitis.   Patient did require Precedex infusion which was weaned and the patient was successfully extubated but unfortunately had intermittent agitation and combative behaviors so low-dose Precedex was resumed. With the addition of Seroquel patient was successfully weaned from the Precedex and agitated and combative behaviors have resolved.   Patient stabilized and was deemed appropriate to transfer to the general medical  floor on 11/13/2013   Hospital Course:  SIGNIFICANT EVENTS / STUDIES:  1/8 Admitted w/ acute encephalopathy and UGIB, treated w/ PPI gtt, transfusion and sedation w/ plan for EGD  1/9 Dveloped acute resp  distress. Required emergent intubation by anesthesia. Initially intubated esophagus w/ large vol bloody gastric output. Moved to ICU for stabilization.  1/9 EGD Christella Hartigan) >>> There was old blood clot and hematin in the stomach. There were two trunks of medium sized varices, no obvious recent site of bleeding. 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 The blood clot in proximal stomach precluded complete view of the stomach and so perhaps there was offending site underlying. I chose to band ligate the esopahgeal varices (5 bands placed in good position). The examination was otherwise normal.  1/8 CT head >>> negative  1/10 CT Abdomen >>> Hepatic cirrhosis and evidence of portal hypertension with mild splenomegaly, moderate ascites, and gastroesophageal varices. Cholelithiasis. Marked gallbladder wall thickening/edema which can be seen in patients with hepatic cirrhosis  1/12 RASS -1 on dexmedetomidine infusion. + F/C. Passed SBT. Extubated and tolerating but intermittently combative. Dex infusion restarted  1/13 Agitated off dexmedetomidine. Somnolent on dex. Seroquel initiated.   Acute hypoxic resp failure/community-acquired pneumonia  -Acute failure requiring intubation to protect airway occurred in endo prior to procedure -Respiratory culture was positive for Moraxella-antibiotics have been completed . -stable on RA at discharge  Encephalopathy/agitation/Schizophrenia  -Resolved  -Initial ammonia level 75- Gi not convinced this was the cause of the encephalopathy  -Agitation symptoms markedly improved with the addition of Seroquel but will not resume at discharge given known hepatic dysfunction and concern pt will resume ETOH after discharge  Hypokalemia  -Orally repleted and K 4.0 at follow up  Acute upper GI bleed  - underwent EGD 11/08/2013 that revealed severe portal gastropathy and medium-sized varices and blood clot thought to be lying on an ulcer  --5 esophageal  bands were placed  -Continue oral PPI- took Aleve for back pain pre admit- advised to stop NSAIDs  - will need repeat EGD in few wks for further banding -to follow up with GI after dc-appointment scheduled  Dehydration/Tachycardia  -Patient was quite dry at presentation but with fluids and transfusion of blood products has achieved normovolemic state  ? Diabetes  -Hemoglobin A1c was 4.06 September 2013  -He is not a diabetic   Cirrhosis with alcoholism/Mild Coagulopathy/Thrombocytopenia  -CIWA initiated at time of admission and currently has been completed (see above regarding Precedex)  -Child -Pugh A -Was given FFP after admission to reverse INR proceed with endoscopy  -Platelets stable of 40-50,000 range   Deconditioning -PT rec HH PT due to gait and balance issues -Medicaid does not pay for [facility] PT so instead will have RN follow pt at home    Consultations:  PCCM  Gastroenterology  Discharge Exam: Filed Vitals:   11/15/13 0510  BP: 133/79  Pulse: 85  Temp: 98.5 F (36.9 C)  Resp: 20   General: No acute respiratory distress  Lungs: Clear to auscultation bilaterally without wheezes or crackles, RA  Cardiovascular: Regular rate and rhythm without murmur gallop or rub normal S1 and S2, trace peripheral edema or JVD  Abdomen: Nontender, nondistended, soft, bowel sounds positive, no rebound, ?? ascites, no appreciable mass  Musculoskeletal: No significant cyanosis, clubbing of bilateral lower extremities  Neurological: Lethargic but no apparent focal neurological deficit    Discharge Instructions   Future Appointments Provider Department Dept Phone   12/10/2013 3:00 PM Reuel Boom  Merrily Brittle, MD [facility] [phone]       Medication List    STOP taking these medications       GLIPIZIDE PO     naproxen 500 MG tablet  Commonly known as:  NAPROSYN      TAKE these medications       beclomethasone 80 MCG/ACT inhaler  Commonly known as:  QVAR   Inhale 1 puff into the lungs 2 (two) times daily.     folic acid 1 MG tablet  Commonly known as:  FOLVITE  Take 1 tablet (1 mg total) by mouth daily.     nicotine 21 mg/24hr patch  Commonly known as:  NICODERM CQ  Place 1 patch (21 mg total) onto the skin daily.     pantoprazole 40 MG tablet  Commonly known as:  PROTONIX  Take 1 tablet (40 mg total) by mouth 2 (two) times daily.     thiamine 100 MG tablet  Take 1 tablet (100 mg total) by mouth daily.       No Known Allergies     Follow-up Information   Follow up with Milus Banister, MD On 12/10/2013. (3 PM to follow up liver disease. )    Specialty:  Gastroenterology   Contact information:   [address] [place] [address] [phone]        The results of significant diagnostics from this hospitalization (including imaging, microbiology, ancillary and laboratory) are listed below for reference.    Significant Diagnostic Studies: Ct Head Wo Contrast  11/07/2013   CLINICAL DATA:  Vomiting blood with slurred speech and confusion.  EXAM: CT HEAD WITHOUT CONTRAST  TECHNIQUE: Contiguous axial images were obtained from the base of the skull through the vertex without intravenous contrast.  COMPARISON:  None.  FINDINGS: The patient's head is mildly tilted to the right.  There is no evidence of acute intracranial hemorrhage, mass lesion, brain edema or extra-axial fluid collection. The ventricles and subarachnoid spaces are appropriately sized for age. There is no CT evidence of acute cortical infarction.  The visualized paranasal sinuses, mastoid air cells and middle ears are clear. The calvarium is intact.  IMPRESSION: Unremarkable noncontrast head CT.   Electronically Signed   By: Camie Patience M.D.   On: 11/07/2013 19:46   Ct Abdomen Pelvis W Contrast  11/09/2013   CLINICAL DATA:  Ventilator dependent respiratory failure. Patient presented with acute upper GI bleeding and had esophageal varices clamped yesterday. Current  history of cirrhosis. Possible sepsis.  EXAM: CT ABDOMEN AND PELVIS WITH CONTRAST  TECHNIQUE: Multidetector CT imaging of the abdomen and pelvis was performed using the standard protocol following bolus administration of intravenous contrast.  CONTRAST:  121mL OMNIPAQUE IOHEXOL 300 MG/ML IV. Oral contrast was not administered.  COMPARISON:  None.  FINDINGS: Beam hardening streak artifact involving the upper abdomen as the patient was unable to raise the right arm for the examination. Irregular hepatic contour were and relative enlargement of the left lobe and caudate lobe, consistent with the given history of hepatic cirrhosis. No focal hepatic parenchymal masses. Mild splenomegaly, the spleen measuring approximately 8.9 x 10.6 x 14.8 cm, yielding a volume of approximately 700 CC; no focal splenic parenchymal abnormality. Moderate intra-abdominal ascites. Gastroesophageal varices. Patent portal vein and splenic vein.  Cholelithiasis, the gallstones on the order of 5 mm in size. Gallbladder wall thickening/edema. No pericholecystic inflammation. No biliary ductal dilation. Normal-appearing pancreas, adrenal glands, and kidneys. Mild to moderate aortoiliac atherosclerosis without  aneurysm. Patent visceral arteries. Multiple normal size and upper normal size lymph nodes scattered throughout the abdomen, the largest in the gastrohepatic ligament measuring approximately 2.9 x 1.1 cm. No nodal masses.  Normal appearing stomach. Mild dilation of a segment of jejunum in the left upper quadrant, fluid-filled, with normal caliber small bowel proximal and distal to this segment. Remainder of the small bowel normal in appearance. There are small bowel loops positioned lateral to the descending colon in the left mid abdomen, including the dilated loop. Marked thickening/edema involving the wall of the cecum and ascending colon. Remainder of the colon decompressed and unremarkable. No free intraperitoneal air.  Urinary bladder  decompressed by Foley catheter, accounting for the gas within the bladder. Prostate gland upper normal in size. Normal seminal vesicles. Phleboliths low in the left side of the pelvis.  Bone window images demonstrate lower thoracic spondylosis and mild degenerative changes involving the lumbar spine. Visualized lung bases clear apart from the expected dependent atelectasis posteriorly. Heart size normal.  IMPRESSION: 1. Hepatic cirrhosis and evidence of portal hypertension with mild splenomegaly, moderate ascites, and gastroesophageal varices. Patent portal vein and splenic vein. 2. Cholelithiasis. Marked gallbladder wall thickening/edema which can be seen in patients with hepatic cirrhosis. Is there clinical evidence of acute cholecystitis? 3. Isolated dilated loop of jejunum in the left upper quadrant. The appearance of this loop (folded upon itself with the small bowel proximal and distal to this loop of normal caliber) raises the question of a closed loop obstruction. The obstruction may just be due to an adhesion involving both ends of the dilated segment. There is no evidence of wall thickening involving this segment. 4. Small bowel loops in the left upper abdomen position lateral to the descending colon, including the dilated segment. This can occasionally be seen in patients with an internal hernia. 5. Ascending colitis. 6. Shoddy lymphadenopathy in the abdomen. Results were discussed with the GI Medicine attending at the time of interpretation on 11/09/2013 at 1130 hr.   Electronically Signed   By: Evangeline Dakin M.D.   On: 11/09/2013 11:38   Dg Chest Port 1 View  11/12/2013   CLINICAL DATA:  Respiratory failure.  EXAM: PORTABLE CHEST - 1 VIEW  COMPARISON:  11/10/2013  FINDINGS: Endotracheal tube has been removed. Mild bibasilar airspace disease is unchanged. No effusion or edema.  IMPRESSION: Mild bibasilar airspace disease is unchanged.  Interval extubation.   Electronically Signed   By: Franchot Gallo M.D.   On: 11/12/2013 07:27   Dg Chest Port 1 View  11/10/2013   CLINICAL DATA:  53 year old male with respiratory failure.  EXAM: PORTABLE CHEST - 1 VIEW  COMPARISON:  11/09/2013 and prior chest radiographs  FINDINGS: An endotracheal tube with tip 2.7 cm above the carina again noted.  Removal of a right central venous catheter is noted.  This is a low volume film with mild peribronchial thickening again noted.  The cardiomediastinal silhouette is stable.  There is no evidence of pneumothorax or pleural effusion.  IMPRESSION: Slightly low volume film and removal of right central venous catheter. No other significant changes noted.   Electronically Signed   By: Hassan Rowan M.D.   On: 11/10/2013 09:05   Portable Chest Xray In Am  11/09/2013   CLINICAL DATA:  Intubation, ventilatory support  EXAM: PORTABLE CHEST - 1 VIEW  COMPARISON:  11/08/2013  FINDINGS: Endotracheal to 3.8 cm above the carina. Right subclavian central line tip at the SVC RA junction level. Stable  heart size with vascular congestion and mild interstitial prominence. No definite edema pattern or pneumonia. No collapse, consolidation, developing effusion or pneumothorax. Overall stable exam.  IMPRESSION: Stable support apparatus.  Mild vascular congestion.   Electronically Signed   By: Daryll Brod M.D.   On: 11/09/2013 08:28   Dg Chest Port 1 View  11/08/2013   CLINICAL DATA:  Endotracheal tube placement  EXAM: PORTABLE CHEST - 1 VIEW  COMPARISON:  Prior film same day  FINDINGS: Cardiomediastinal silhouette is stable. Endotracheal tube in place with tip 2.7 cm above the carina. Stable right subclavian central line position. No acute infiltrate or pulmonary edema. No diagnostic pneumothorax.  IMPRESSION: No active disease. Endotracheal tube in place. No diagnostic pneumothorax.   Electronically Signed   By: Lahoma Crocker M.D.   On: 11/08/2013 17:38   Dg Chest Port 1 View  11/08/2013   CLINICAL DATA:  Evaluate endotracheal tube and central  line  EXAM: PORTABLE CHEST - 1 VIEW  COMPARISON:  November 08, 2013 08/1950 a.m.  FINDINGS: Endotracheal tube is noted 6.9 cm from carina. Advancement by 3 cm is recommended. There is no pneumothorax. Right subclavian central venous line is noted with distal tip in the superior vena cava. A nasogastric tube is identified with distal tip not included on film but is at least in the mid stomach. There is diffuse increased pulmonary interstitium bilaterally. There is no pleural effusion. The heart size is normal.  IMPRESSION: Life supporting devices as described. Advancement of the endotracheal 2 x 3 cm is recommended. There is no pneumothorax. Mild diffuse interstitial edema.   Electronically Signed   By: Abelardo Diesel M.D.   On: 11/08/2013 14:30   Dg Chest Port 1 View  11/08/2013   CLINICAL DATA:  Respiratory failure, intubation  EXAM: PORTABLE CHEST - 1 VIEW  COMPARISON:  Portable exam 1151 hr without priors for comparison  FINDINGS: Tip of endotracheal tube projects 3.5 above carina.  Normal heart size, mediastinal contours and pulmonary vascularity.  Accentuation of interstitial markings in the mid to lower lungs, uncertain acuity.  No segmental consolidation, pleural effusion or pneumothorax.  Question left nipple shadow.  No acute osseous findings.  IMPRESSION: Satisfactory endotracheal tube position.  Minimal accentuation of interstitial markings, of uncertain acuity.  Questionable left nipple shadow; recommend followup upright PA chest radiograph with nipple markers when the patient's condition permits for further evaluation.   Electronically Signed   By: Lavonia Dana M.D.   On: 11/08/2013 13:08   Dg Abd Portable 1v  11/10/2013   CLINICAL DATA:  53 year old male with abdominal pain. Possible small bowel obstruction.  EXAM: PORTABLE ABDOMEN - 1 VIEW  COMPARISON:  11/09/2013 CT  FINDINGS: The bowel gas pattern is unremarkable. Gas in the colon and rectum are noted.  No dilated bowel loops are present.   Calcified gallstones are again noted.  No acute bony abnormalities are present.  IMPRESSION: No radiographic evidence of acute abnormality. Bowel gas pattern is unremarkable on today's study.  Cholelithiasis.   Electronically Signed   By: Hassan Rowan M.D.   On: 11/10/2013 09:36    Microbiology: Recent Results (from the past 240 hour(s))  URINE CULTURE     Status: None   Collection Time    11/07/13  8:20 PM      Result Value Range Status   Specimen Description URINE, CATHETERIZED   Final   Special Requests NONE   Final   Culture  Setup Time  Final   Value: 11/08/2013 11:23     Performed at [facility]     Final   Value: NO GROWTH     Performed at [facility]   Culture     Final   Value: NO GROWTH     Performed at [facility]   Report Status 11/09/2013 FINAL   Final  MRSA PCR SCREENING     Status: None   Collection Time    11/07/13 11:26 PM      Result Value Range Status   MRSA by PCR NEGATIVE  NEGATIVE Final   Comment:            The GeneXpert MRSA Assay (FDA     approved for NASAL specimens     only), is one component of a     comprehensive MRSA colonization     surveillance program. It is not     intended to diagnose MRSA     infection nor to guide or     monitor treatment for     MRSA infections.  CULTURE, RESPIRATORY (NON-EXPECTORATED)     Status: None   Collection Time    11/08/13  5:41 PM      Result Value Range Status   Specimen Description TRACHEAL ASPIRATE   Final   Special Requests NONE   Final   Gram Stain     Final   Value: MODERATE WBC PRESENT,BOTH PMN AND MONONUCLEAR     NO SQUAMOUS EPITHELIAL CELLS SEEN     ABUNDANT GRAM NEGATIVE COCCI     MODERATE GRAM POSITIVE COCCI IN PAIRS     RARE GRAM NEGATIVE RODS   Culture     Final   Value: MODERATE MORAXELLA CATARRHALIS(BRANHAMELLA)     Note: BETA LACTAMASE POSITIVE     Performed at [facility]   Report Status 11/11/2013 FINAL   Final     Labs: Basic  Metabolic Panel:  Recent Labs Lab 11/11/13 0245 11/12/13 0300 11/13/13 0220 11/14/13 0250 11/15/13 0305  NA 144 147 143 140 142  K 4.1 4.7 3.6* 3.5* 4.0  CL 116* 119* 114* 108 110  CO2 19 16* 18* 20 20  GLUCOSE 107* 101* 89 103* 102*  BUN 36* 30* 26* 19 15  CREATININE 1.00 0.80 1.11 1.08 0.88  CALCIUM 7.4* 7.6* 7.5* 7.7* 8.1*   Liver Function Tests:  Recent Labs Lab 11/09/13 0441 11/10/13 0308 11/12/13 0300 11/15/13 0305  AST 56* 49* 47* 66*  ALT 58* 51 43 55*  ALKPHOS 113 97 89 108  BILITOT 2.5* 1.3* 1.3* 1.1  PROT 5.7* 5.5* 5.1* 5.9*  ALBUMIN 2.4* 2.3* 2.0* 2.4*   No results found for this basename: LIPASE, AMYLASE,  in the last 168 hours  Recent Labs Lab 11/09/13 0445  AMMONIA 81*   CBC:  Recent Labs Lab 11/10/13 0308 11/11/13 0245 11/12/13 0300 11/14/13 0250 11/15/13 0305  WBC 11.3* 5.4 3.7* 5.9 5.7  HGB 9.9* 9.1* 9.4* 9.9* 10.3*  HCT 28.6* 26.6* 28.3* 29.0* 30.4*  MCV 99.0 101.5* 101.4* 100.0 100.0  PLT 53* 49* 49* 47* 55*   Cardiac Enzymes: No results found for this basename: CKTOTAL, CKMB, CKMBINDEX, TROPONINI,  in the last 168 hours BNP: BNP (last 3 results) No results found for this basename: PROBNP,  in the last 8760 hours CBG:  Recent Labs Lab 11/14/13 1230 11/14/13 1630 11/14/13 2131 11/15/13 0750 11/15/13 1158  GLUCAP 105* 198* 124* 92 133*  Signed:  ELLIS,ALLISON L. ANP  [facility] 11/15/2013, 12:11 PM   I have examined the patient, reviewed the chart and modified the above note which I agree with.   [id number] 11/23/2013, 10:21 AM

## 2013-11-15 NOTE — Progress Notes (Signed)
Pt's sister called to inquire about pt's whereabouts. Relayed today's events to her and told her to expect discharge instructions and prescriptions to arrive via mail.

## 2013-11-15 NOTE — Progress Notes (Signed)
Notified by Ebony Hail from [facility] that pt remains in their facility. DON of [facility] has offered to give pt ride home.

## 2013-11-19 ENCOUNTER — Telehealth: Payer: Self-pay | Admitting: Internal Medicine

## 2013-11-19 NOTE — Telephone Encounter (Signed)
Katie from [facility] care called regarding Mr. Fallin, she states that Mr. Kirschenmann was released from the hospital with no medication. Pt is having swelling of the feet and genitals as well. Please contact katie @ [phone]

## 2013-11-20 ENCOUNTER — Other Ambulatory Visit: Payer: Self-pay

## 2013-11-20 MED ORDER — FOLIC ACID 1 MG PO TABS: 1.0000 mg | ORAL_TABLET | Freq: Every day | ORAL | Status: DC

## 2013-11-20 MED ORDER — PANTOPRAZOLE SODIUM 40 MG PO TBEC: 40.0000 mg | DELAYED_RELEASE_TABLET | Freq: Two times a day (BID) | ORAL | Status: DC

## 2013-11-20 MED ORDER — BECLOMETHASONE DIPROPIONATE 80 MCG/ACT IN AERS: 1.0000 | INHALATION_SPRAY | Freq: Two times a day (BID) | RESPIRATORY_TRACT | Status: DC

## 2013-11-20 MED ORDER — THIAMINE HCL 100 MG PO TABS: 100.0000 mg | ORAL_TABLET | Freq: Every day | ORAL | Status: DC

## 2013-11-21 ENCOUNTER — Emergency Department (HOSPITAL_COMMUNITY): Admission: EM | Admit: 2013-11-21 | Discharge: 2013-11-21 | Payer: Medicaid Other | Source: Home / Self Care

## 2013-11-21 ENCOUNTER — Emergency Department (HOSPITAL_COMMUNITY)
Admission: EM | Admit: 2013-11-21 | Discharge: 2013-11-21 | Disposition: A | Discharge status: Home / Self Care | Payer: Medicaid Other | Attending: Emergency Medicine | Admitting: Emergency Medicine

## 2013-11-21 ENCOUNTER — Encounter (HOSPITAL_COMMUNITY): Payer: Self-pay | Admitting: Emergency Medicine

## 2013-11-21 DIAGNOSIS — R5381 Other malaise: Secondary | ICD-10-CM | POA: Insufficient documentation

## 2013-11-21 DIAGNOSIS — R601 Generalized edema: Secondary | ICD-10-CM

## 2013-11-21 DIAGNOSIS — R5383 Other fatigue: Secondary | ICD-10-CM

## 2013-11-21 DIAGNOSIS — IMO0002 Reserved for concepts with insufficient information to code with codable children: Secondary | ICD-10-CM | POA: Insufficient documentation

## 2013-11-21 DIAGNOSIS — R609 Edema, unspecified: Secondary | ICD-10-CM | POA: Insufficient documentation

## 2013-11-21 DIAGNOSIS — F17200 Nicotine dependence, unspecified, uncomplicated: Secondary | ICD-10-CM | POA: Insufficient documentation

## 2013-11-21 DIAGNOSIS — K7460 Unspecified cirrhosis of liver: Secondary | ICD-10-CM | POA: Insufficient documentation

## 2013-11-21 DIAGNOSIS — Z8611 Personal history of tuberculosis: Secondary | ICD-10-CM | POA: Insufficient documentation

## 2013-11-21 DIAGNOSIS — Z8659 Personal history of other mental and behavioral disorders: Secondary | ICD-10-CM | POA: Insufficient documentation

## 2013-11-21 DIAGNOSIS — E119 Type 2 diabetes mellitus without complications: Secondary | ICD-10-CM | POA: Insufficient documentation

## 2013-11-21 DIAGNOSIS — Z8719 Personal history of other diseases of the digestive system: Secondary | ICD-10-CM | POA: Insufficient documentation

## 2013-11-21 DIAGNOSIS — Z79899 Other long term (current) drug therapy: Secondary | ICD-10-CM | POA: Insufficient documentation

## 2013-11-21 DIAGNOSIS — Z862 Personal history of diseases of the blood and blood-forming organs and certain disorders involving the immune mechanism: Secondary | ICD-10-CM | POA: Insufficient documentation

## 2013-11-21 HISTORY — DX: Thrombocytopenia, unspecified: D69.6

## 2013-11-21 HISTORY — DX: Hepatic failure, unspecified without coma: K72.90

## 2013-11-21 HISTORY — DX: Gastrointestinal hemorrhage, unspecified: K92.2

## 2013-11-21 HISTORY — DX: Unspecified cirrhosis of liver: K74.60

## 2013-11-21 HISTORY — DX: Hepatic encephalopathy: K76.82

## 2013-11-21 HISTORY — DX: Esophageal varices without bleeding: I85.00

## 2013-11-21 LAB — COMPREHENSIVE METABOLIC PANEL
ALK PHOS: 117 U/L (ref 39–117)
ALT: 57 U/L — ABNORMAL HIGH (ref 0–53)
AST: 71 U/L — ABNORMAL HIGH (ref 0–37)
Albumin: 2.5 g/dL — ABNORMAL LOW (ref 3.5–5.2)
BUN: 15 mg/dL (ref 6–23)
CHLORIDE: 105 meq/L (ref 96–112)
CO2: 21 meq/L (ref 19–32)
CREATININE: 0.79 mg/dL (ref 0.50–1.35)
Calcium: 8.3 mg/dL — ABNORMAL LOW (ref 8.4–10.5)
GFR calc Af Amer: >90 mL/min (ref >90)
Glucose, Bld: 105 mg/dL — ABNORMAL HIGH (ref 70–99)
POTASSIUM: 3.8 meq/L (ref 3.7–5.3)
Sodium: 137 mEq/L (ref 137–147)
Total Bilirubin: 1 mg/dL (ref 0.3–1.2)
Total Protein: 6.3 g/dL (ref 6.0–8.3)

## 2013-11-21 LAB — URINALYSIS, ROUTINE W REFLEX MICROSCOPIC
GLUCOSE, UA: NEGATIVE mg/dL
Hgb urine dipstick: NEGATIVE
KETONES UR: NEGATIVE mg/dL
LEUKOCYTES UA: NEGATIVE
NITRITE: NEGATIVE
PH: 6 (ref 5.0–8.0)
Protein, ur: NEGATIVE mg/dL
SPECIFIC GRAVITY, URINE: 1.04 — AB (ref 1.005–1.030)
Urobilinogen, UA: 0.2 mg/dL (ref 0.0–1.0)

## 2013-11-21 LAB — CBC WITH DIFFERENTIAL/PLATELET
Basophils Absolute: 0 10*3/uL (ref 0.0–0.1)
Basophils Relative: 0 % (ref 0–1)
Eosinophils Absolute: 0 10*3/uL (ref 0.0–0.7)
Eosinophils Relative: 0 % (ref 0–5)
HEMATOCRIT: 29.2 % — AB (ref 39.0–52.0)
HEMOGLOBIN: 9.7 g/dL — AB (ref 13.0–17.0)
LYMPHS PCT: 10 % — AB (ref 12–46)
Lymphs Abs: 0.9 10*3/uL (ref 0.7–4.0)
MCH: 33 pg (ref 26.0–34.0)
MCHC: 33.2 g/dL (ref 30.0–36.0)
MCV: 99.3 fL (ref 78.0–100.0)
MONO ABS: 1.1 10*3/uL — AB (ref 0.1–1.0)
MONOS PCT: 12 % (ref 3–12)
NEUTROS ABS: 7.1 10*3/uL (ref 1.7–7.7)
NEUTROS PCT: 77 % (ref 43–77)
Platelets: 99 10*3/uL — ABNORMAL LOW (ref 150–400)
RBC: 2.94 MIL/uL — ABNORMAL LOW (ref 4.22–5.81)
RDW: 17.3 % — ABNORMAL HIGH (ref 11.5–15.5)
WBC: 9.2 10*3/uL (ref 4.0–10.5)

## 2013-11-21 LAB — AMMONIA: AMMONIA: 83 umol/L — AB (ref 11–60)

## 2013-11-21 MED ORDER — OXYCODONE-ACETAMINOPHEN 5-325 MG PO TABS: 1.0000 | ORAL_TABLET | Freq: Three times a day (TID) | ORAL | Status: DC | PRN

## 2013-11-21 MED ORDER — FUROSEMIDE 20 MG PO TABS: 20.0000 mg | ORAL_TABLET | Freq: Two times a day (BID) | ORAL | Status: DC

## 2013-11-21 NOTE — ED Notes (Addendum)
Pt reports being discharged from hospital on 1/18 for hep C, liver damage and ascites. Pt reports increase in swelling but more specifically severe swelling and pain to groin. Airway intact. Reports being dc from hospital with meds or prescriptions.

## 2013-11-21 NOTE — ED Provider Notes (Signed)
CSN: [id number]     Arrival date & time 11/21/13  1603 History   First MD Initiated Contact with Patient 11/21/13 1641     Chief Complaint  Patient presents with  . Groin Swelling    The history is provided by the patient.  pt presents for abdominal and groin swelling for past several weeks.  It is worsening. Nothing improves symptoms.  Palpation of abdomen worsens symptoms.  He denies cp/sob.  He denies fever/vomiting  Pt reports he was recently admitted to hospital and reports his swelling never improved after discharge from hospital and it is now worsening.  He reports he never got this home medications, and was told today by his home health nurse to come to ER for evaluation of his edema   He denies fever/vomitng/cp/sob No abd pain No back pain He does report dysuria   Past Medical History  Diagnosis Date  . Diabetes mellitus without complication   . Schizophrenia     onset age 55.  in and out of psych facilities from age 74 to age 89.  s/p electroconvulsive therapy.  . TB (tuberculosis)     sister says this dx'd at age 89 and treated  . Hepatic encephalopathy   . Esophageal varices   . GI bleed   . Cirrhosis   . Thrombocytopenia    Past Surgical History  Procedure Laterality Date  . Cataract surgery     . Esophagogastroduodenoscopy N/A 11/08/2013    Procedure: ESOPHAGOGASTRODUODENOSCOPY (EGD);  Surgeon: Milus Banister, MD;  Location: [facility];  Service: Endoscopy;  Laterality: N/A;  bedside   Family History  Problem Relation Age of Onset  . Hypertension Mother    History  Substance Use Topics  . Smoking status: Heavy Tobacco Smoker -- 0.50 packs/day for 25 years    Types: Cigarettes  . Smokeless tobacco: Not on file  . Alcohol Use: Yes     Comment:  drink beer socially     Review of Systems  Constitutional: Positive for fatigue. Negative for fever.  Cardiovascular: Negative for chest pain.  Gastrointestinal: Negative for vomiting and abdominal pain.   Genitourinary: Negative for dysuria.  Neurological: Negative for syncope.  All other systems reviewed and are negative.    Allergies  Haldol  Home Medications   Current Outpatient Rx  Name  Route  Sig  Dispense  Refill  . beclomethasone (QVAR) 80 MCG/ACT inhaler   Inhalation   Inhale 1 puff into the lungs 2 (two) times daily.         . folic acid (FOLVITE) 1 MG tablet   Oral   Take 1 mg by mouth daily.         . pantoprazole (PROTONIX) 40 MG tablet   Oral   Take 40 mg by mouth 2 (two) times daily.         Marland Kitchen thiamine (VITAMIN B-1) 100 MG tablet   Oral   Take 100 mg by mouth daily.          BP 158/81  Pulse 90  Temp(Src) 97.8 F (36.6 C) (Oral)  Resp 18  SpO2 100% Physical Exam CONSTITUTIONAL: ill appearing HEAD: Normocephalic/atraumatic EYES: EOMI/PERRL ENMT: Mucous membranes moist NECK: supple no meningeal signs SPINE:entire spine nontender CV: S1/S2 noted, no murmurs/rubs/gallops noted LUNGS: Lungs are clear to auscultation bilaterally, no apparent distress ABDOMEN: soft, abdomen is distended.  No focal tenderness.   GU:no cva tenderness, scrotal edema is noted with mild erythema but no crepitance or abscess is  noted NEURO: Pt is awake/alert, moves all extremitiesx4, pt is ambulatory in the ED EXTREMITIES: pulses normal, full ROM, pitting edema is noted to bilateral LE SKIN: warm, color normal   ED Course  Procedures (including critical care time)  5:18 PM Pt recently admitted for cirrhosis/encephalopathy and GI bleed and he had difficult course while in hospital including intubation.  It appears that he left without getting any medications.   His abdomen is distended likely from ascites though does not appear to have SBP 10:13 PM Pt feels improved.  No distress noted He reports he does have meds at home - sister is here to help confirm story He does have diffuse edema/anasarca He has gained weight since recent hospital admission Will start  short course of lasix to see if this should help his edema I have advised need for close f/u for re-evaluation and lab evaluation (K is currently normal) He has no signs of acute CHF.  No signs of SBP at this time His appearance is likely result of his cirrhosis  He also requests short course of pain meds as he is having pain due to his edema  Labs Review Labs Reviewed  CBC WITH DIFFERENTIAL  COMPREHENSIVE METABOLIC PANEL  AMMONIA  URINALYSIS, ROUTINE W REFLEX MICROSCOPIC   Imaging Review No results found.  EKG Interpretation   None       MDM  No diagnosis found. Nursing notes including past medical history and social history reviewed and considered in documentation Labs/vital reviewed and considered Previous records reviewed and considered    Date: 11/21/2013  Rate: 85  Rhythm: normal sinus rhythm  QRS Axis: normal  Intervals: normal  ST/T Wave abnormalities: nonspecific ST changes  Conduction Disutrbances:none     Sharyon Cable, MD 11/21/13 2215

## 2013-11-21 NOTE — ED Notes (Signed)
Pt.was inncont.change sheets and gown

## 2013-11-21 NOTE — ED Notes (Signed)
Pt unable to urinate at this time.  

## 2013-11-21 NOTE — ED Notes (Signed)
Pt presents with increased swelling throughout body since being d/c from hospital on 1/18- pt did not get paperwork upon discharge and therefore was unable to fill prescriptions.  Abdomen distended, 2 + pitting edema noted to lower extremities.

## 2013-11-21 NOTE — Discharge Instructions (Signed)
Cirrhosis  Cirrhosis is a condition of scarring of the liver which is caused when the liver has tried repairing itself following damage. This damage may come from a previous infection such as one of the forms of hepatitis (usually hepatitis C), or the damage may come from being injured by toxins. The main toxin that causes this damage is alcohol. The scarring of the liver from use of alcohol is irreversible. That means the liver cannot return to normal even though alcohol is not used any more. The main danger of hepatitis C infection is that it may cause long-lasting (chronic) liver disease, and this also may lead to cirrhosis. This complication is progressive and irreversible.  CAUSES   Prior to available blood tests, hepatitis C could be contracted by blood transfusions. Since testing of blood has improved, this is now unlikely. This infection can also be contracted through intravenous drug use and the sharing of needles. It can also be contracted through sexual relationships. The injury caused by alcohol comes from too much use. It is not a few drinks that poison the liver, but years of misuse. Usually there will be some signs and symptoms early with scarring of the liver that suggest the development of better habits. Alcohol should never be used while using acetaminophen. A small dose of both taken together may cause irreversible damage to the liver.  HOME CARE INSTRUCTIONS   There is no specific treatment for cirrhosis. However, there are things you can do to avoid making the condition worse.  · Rest as needed.  · Eat a well-balanced diet. Your caregiver can help you with suggestions.  · Vitamin supplements including vitamins A, K, D, and thiamine can help.  · A low-salt diet, water restriction, or diuretic medicine may be needed to reduce fluid retention.  · Avoid alcohol. This can be extremely toxic if combined with acetaminophen.  · Avoid drugs which are toxic to the liver. Some of these include isoniazid,  methyldopa, acetaminophen, anabolic steroids (muscle-building drugs), erythromycin, and oral contraceptives (birth control pills). Check with your caregiver to make sure medicines you are presently taking will not be harmful.  · Periodic blood tests may be required. Follow your caregiver's advice regarding the timing of these.  · Milk thistle is an herbal remedy which does protect the liver against toxins. However, it will not help once the liver has been scarred.  SEEK MEDICAL CARE IF:  · You have increasing fatigue or weakness.  · You develop swelling of the hands, feet, legs, or face.  · You vomit bright red blood, or a coffee ground appearing material.  · You have blood in your stools, or the stools turn black and tarry.  · You have a fever.  · You develop loss of appetite, or have nausea and vomiting.  · You develop jaundice.  · You develop easy bruising or bleeding.  · You have worsening of any of the problems you are concerned about.  Document Released: 10/17/2005 Document Revised: 01/09/2012 Document Reviewed: 06/04/2008  Exit[employer]® Patient Information ©2014 [employer].

## 2013-11-22 ENCOUNTER — Telehealth: Payer: Self-pay | Admitting: Internal Medicine

## 2013-11-22 NOTE — Telephone Encounter (Signed)
Di Kindle from Home health care called regarding Mr. Arguijo, She would like a nurse to give her a call back.

## 2013-11-22 NOTE — Telephone Encounter (Signed)
Di Kindle home health Returned her phone call Not  Available Left message on voice mail to return our call

## 2013-11-26 NOTE — Telephone Encounter (Signed)
Spoke with sister in regards to her  Brother Santiel Topper needing ensure,hospital bed and lasix. [facility] following [facility]. Left message for her to please contact me for order

## 2013-11-27 ENCOUNTER — Telehealth: Payer: Self-pay

## 2013-11-27 NOTE — Telephone Encounter (Signed)
[facility] called Requesting lasix refill Needs order for hospital bed and order for  PT

## 2013-11-29 ENCOUNTER — Telehealth: Payer: Self-pay | Admitting: Emergency Medicine

## 2013-11-29 ENCOUNTER — Telehealth: Payer: Self-pay | Admitting: Internal Medicine

## 2013-11-29 MED ORDER — SULFAMETHOXAZOLE-TMP DS 800-160 MG PO TABS: 1.0000 | ORAL_TABLET | Freq: Two times a day (BID) | ORAL | Status: DC

## 2013-11-29 MED ORDER — FUROSEMIDE 20 MG PO TABS: 20.0000 mg | ORAL_TABLET | Freq: Two times a day (BID) | ORAL | Status: DC

## 2013-11-29 NOTE — Telephone Encounter (Signed)
[facility], is calling about pt's medication refills and has question about care. Please f/u with pt.

## 2013-11-29 NOTE — Telephone Encounter (Signed)
Colletta Maryland from [facility] called regarding [name] need for lasix and atb s/p Cellulitis of both lower extremities Pt was seen in ER 11/21/13 and prescribed #14 Lasix 20 mg and told to f/u with PCP Spoke with Dr. Doreene Burke- Lasix reordered and Bactrim until pt to return 12/02/13 Sister [name] verbalized understanding

## 2013-12-01 DEATH — deceased

## 2013-12-02 ENCOUNTER — Ambulatory Visit: Payer: Medicaid Other | Attending: Internal Medicine | Admitting: Internal Medicine

## 2013-12-02 VITALS — BP 127/79 | HR 105 | Temp 98.1°F | Resp 22 | Ht 68.0 in | Wt 257.0 lb

## 2013-12-02 DIAGNOSIS — L0291 Cutaneous abscess, unspecified: Secondary | ICD-10-CM

## 2013-12-02 DIAGNOSIS — E119 Type 2 diabetes mellitus without complications: Secondary | ICD-10-CM

## 2013-12-02 DIAGNOSIS — L0390 Cellulitis, unspecified: Secondary | ICD-10-CM

## 2013-12-02 DIAGNOSIS — K769 Liver disease, unspecified: Secondary | ICD-10-CM

## 2013-12-02 DIAGNOSIS — B1920 Unspecified viral hepatitis C without hepatic coma: Secondary | ICD-10-CM

## 2013-12-02 DIAGNOSIS — L02419 Cutaneous abscess of limb, unspecified: Secondary | ICD-10-CM | POA: Insufficient documentation

## 2013-12-02 DIAGNOSIS — L03119 Cellulitis of unspecified part of limb: Secondary | ICD-10-CM

## 2013-12-02 DIAGNOSIS — K7290 Hepatic failure, unspecified without coma: Secondary | ICD-10-CM | POA: Insufficient documentation

## 2013-12-02 HISTORY — DX: Unspecified viral hepatitis C without hepatic coma: B19.20

## 2013-12-02 MED ORDER — LACTULOSE 10 GM/15ML PO SOLN: 20.0000 g | Freq: Three times a day (TID) | ORAL | Status: DC

## 2013-12-02 MED ORDER — VITAMIN B-1 100 MG PO TABS: 100.0000 mg | ORAL_TABLET | Freq: Every day | ORAL | Status: DC

## 2013-12-02 MED ORDER — SPIRONOLACTONE 50 MG PO TABS: 50.0000 mg | ORAL_TABLET | Freq: Every day | ORAL | Status: DC

## 2013-12-02 MED ORDER — FOLIC ACID 1 MG PO TABS: 1.0000 mg | ORAL_TABLET | Freq: Every day | ORAL | Status: DC

## 2013-12-02 MED ORDER — SULFAMETHOXAZOLE-TMP DS 800-160 MG PO TABS: 1.0000 | ORAL_TABLET | Freq: Two times a day (BID) | ORAL | Status: DC

## 2013-12-02 MED ORDER — FUROSEMIDE 40 MG PO TABS: 40.0000 mg | ORAL_TABLET | Freq: Two times a day (BID) | ORAL | Status: DC

## 2013-12-02 NOTE — Progress Notes (Signed)
Pt here with c/o increased cellulitis to both legs radiating up to scrotum +3 left leg with scabbed areas from scabbing. Pt is being seen by [facility] and needs home care hospital bed and other needs for  immobility.  Pt is very sob with wheezing. Last used breathing treatment last week. C/o +10 pain all over Asthma noted

## 2013-12-02 NOTE — Telephone Encounter (Signed)
Pt was seen today in clinic. We will send orders to [facility]

## 2013-12-02 NOTE — Progress Notes (Signed)
Patient ID: Oscar Swanson, male   DOB: Feb 12, 1961, 53 y.o.   MRN: [id number] Patient Demographics  Oscar Swanson, is a 53 y.o. male  FUX:[id number]  [id number]  DOB - 08-19-1961  Chief Complaint  Patient presents with  . Follow-up  . Cellulitis  . Shortness of Breath  . Wheezing        Subjective:   Oscar Swanson is a 53 y.o. male here today for a follow up visit. 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 [facility] because of worsening edema of both legs now extending to the scrotum and remarkable shortness of breath. Patient was seen and emergency department, treated with Lasix 20 mg IV push and sent home on 20 mg tablet by mouth twice a day, swelling is getting worse despite this dosage. Patient has appointment with gastroenterologist coming up in about a week. Today his condition remains the same with marked swelling of both legs, scrotum and abdomen. No yellowness of eyes, no confusion, denies headache. Patient continue to smoke cigarettes heavily about half a pack per day, claims he doesn't drink alcohol anymore. No bleeding. No blood in stool or urine. Patient has No headache, No chest pain, No abdominal pain - No Nausea, No new weakness tingling or numbness, No Cough - SOB.  ALLERGIES: Allergies  Allergen Reactions  . Haldol [Haloperidol]     afib problems    PAST MEDICAL HISTORY: Past Medical History  Diagnosis Date  . Diabetes mellitus without complication   . Schizophrenia     onset age 12.  in and out of psych facilities from age 53 to age 30.  s/p electroconvulsive therapy.  . TB (tuberculosis)     sister says this dx'd at age 70 and treated  . Hepatic encephalopathy   . Esophageal varices   . GI bleed   . Cirrhosis   . Thrombocytopenia     MEDICATIONS AT HOME: Prior to Admission medications   Medication Sig Start Date End  Date Taking? Authorizing Provider  beclomethasone (QVAR) 80 MCG/ACT inhaler Inhale 1 puff into the lungs 2 (two) times daily.   Yes Historical Provider, MD  folic acid (FOLVITE) 1 MG tablet Take 1 tablet (1 mg total) by mouth daily. 12/02/13  Yes Angelica Chessman, MD  furosemide (LASIX) 40 MG tablet Take 1 tablet (40 mg total) by mouth 2 (two) times daily. 12/02/13  Yes Angelica Chessman, MD  oxyCODONE-acetaminophen (PERCOCET/ROXICET) 5-325 MG per tablet Take 1 tablet by mouth every 8 (eight) hours as needed for severe pain. 11/21/13  Yes Sharyon Cable, MD  pantoprazole (PROTONIX) 40 MG tablet Take 40 mg by mouth 2 (two) times daily.   Yes Historical Provider, MD  sulfamethoxazole-trimethoprim (BACTRIM DS) 800-160 MG per tablet Take 1 tablet by mouth 2 (two) times daily. 12/02/13  Yes Angelica Chessman, MD  thiamine (VITAMIN B-1) 100 MG tablet Take 1 tablet (100 mg total) by mouth daily. 12/02/13  Yes Angelica Chessman, MD  lactulose (CHRONULAC) 10 GM/15ML solution Take 30 mLs (20 g total) by mouth 3 (three) times daily. 12/02/13   Angelica Chessman, MD  spironolactone (ALDACTONE) 50 MG tablet Take 1 tablet (50 mg total) by mouth daily. 12/02/13   Angelica Chessman, MD     Objective:   Filed Vitals:   12/02/13 1102  BP: 127/79  Pulse: 105  Temp: 98.1 F (36.7 C)  TempSrc: Oral  Resp:  22  Height: 5\' 8"  (1.727 m)  Weight: 257 lb (116.574 kg)  SpO2: 98%    Exam General appearance : Awake, alert, not in any distress. Speech Clear. Chronically ill-looking with temporal wasting bilaterally HEENT: Atraumatic and Normocephalic, pupils equally reactive to light and accomodation Neck: supple, no JVD. No cervical lymphadenopathy.  Chest:Good air entry bilaterally, no added sounds  CVS: S1 S2 regular, tachycardia, no murmurs.  Abdomen:  distended with visible anterior abdominal veins, Bowel sounds present, Non tender, no gaurding, rigidity or rebound. Ascites+++ Extremities: B/L Lower Ext shows   marked edema up to the thigh and including the scrotum and phallus, anterior abdominal wall also shows pitting edema. There is a mild hyperemia in both legs  Neurology: Awake alert, and oriented X 3, CN II-XII intact, Non focal  Wounds:N/A   Data Review   CBC No results found for this basename: WBC, HGB, HCT, PLT, MCV, MCH, MCHC, RDW, NEUTRABS, LYMPHSABS, MONOABS, EOSABS, BASOSABS, BANDABS, BANDSABD,  in the last 168 hours  Chemistries   No results found for this basename: NA, K, CL, CO2, GLUCOSE, BUN, CREATININE, GFRCGP, CALCIUM, MG, AST, ALT, ALKPHOS, BILITOT,  in the last 168 hours ------------------------------------------------------------------------------------------------------------------ No results found for this basename: HGBA1C,  in the last 72 hours ------------------------------------------------------------------------------------------------------------------ No results found for this basename: CHOL, HDL, LDLCALC, TRIG, CHOLHDL, LDLDIRECT,  in the last 72 hours ------------------------------------------------------------------------------------------------------------------ No results found for this basename: TSH, T4TOTAL, FREET3, T3FREE, THYROIDAB,  in the last 72 hours ------------------------------------------------------------------------------------------------------------------ No results found for this basename: VITAMINB12, FOLATE, FERRITIN, TIBC, IRON, RETICCTPCT,  in the last 72 hours  Coagulation profile  No results found for this basename: INR, PROTIME,  in the last 168 hours    Assessment & Plan   1. Diabetes Last hemoglobin A1c shows no diabetes  2. 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  Dispense: 90 tablet; Refill: 3 - furosemide (LASIX) 40 MG tablet; Take 1 tablet (40 mg total) by mouth 2 (two) times daily.  Dispense: 30 tablet; Refill: 0 - folic  acid (FOLVITE) 1 MG tablet; Take 1 tablet (1 mg total) by mouth daily.  Dispense: 90 tablet; Refill: 3 - spironolactone (ALDACTONE) 50 MG tablet; Take 1 tablet (50 mg total) by mouth daily.  Dispense: 30 tablet; Refill: 0 - lactulose (CHRONULAC) 10 GM/15ML solution; Take 30 mLs (20 g total) by mouth 3 (three) times daily.  Dispense: 960 mL; Refill: 3  3. Hepatitis C Ambulatory referral to gastroenterologist  4. Cellulitis  - sulfamethoxazole-trimethoprim (BACTRIM DS) 800-160 MG per tablet; Take 1 tablet by mouth 2 (two) times daily.  Dispense: 14 tablet; Refill: 0  Patient extensively counseled about the disease and likely prognosis   [facility]  Follow up in 5 days for review of clinical status  The patient was given clear instructions to go to ER or return to medical center if symptoms don't improve, worsen or new problems develop. The patient verbalized understanding. The patient was told to call to get lab results if they haven't heard anything in the next week.    Angelica Chessman, MD, [place], [place], [place] and [place], [place]   12/02/2013, 11:29 AM

## 2013-12-02 NOTE — Telephone Encounter (Signed)
Colletta Maryland called again and says pt has sores on his legs and wondering if he could get a order for boots to prevent more sores. Please f/u with nurse.

## 2013-12-04 ENCOUNTER — Telehealth: Payer: Self-pay | Admitting: Internal Medicine

## 2013-12-04 NOTE — Telephone Encounter (Signed)
1. Pt says that Dr has already faxed over order for hospital bed, wheel chair and bed side commode but [facility] has not received it yet.  [facility]  needs forms faxed over again.   2. Nurse advises another order for wound care because it looks like the patient might have cellulitis on legs.  Can uses a verbal order for would check.

## 2013-12-06 ENCOUNTER — Telehealth: Payer: Self-pay | Admitting: Emergency Medicine

## 2013-12-06 ENCOUNTER — Ambulatory Visit: Payer: Medicaid Other | Attending: Internal Medicine

## 2013-12-06 VITALS — BP 123/78 | HR 97 | Temp 98.8°F | Resp 22 | Ht 71.0 in | Wt 257.0 lb

## 2013-12-06 DIAGNOSIS — K769 Liver disease, unspecified: Secondary | ICD-10-CM | POA: Insufficient documentation

## 2013-12-06 DIAGNOSIS — K7290 Hepatic failure, unspecified without coma: Secondary | ICD-10-CM

## 2013-12-06 MED ORDER — ENSURE HIGH PROTEIN PO LIQD: ORAL | Status: DC

## 2013-12-06 MED ORDER — LACTULOSE 10 GM/15ML PO SOLN: 20.0000 g | Freq: Three times a day (TID) | ORAL | Status: DC

## 2013-12-06 NOTE — Progress Notes (Signed)
Pt returns today for f/o bilat lower extremity cellulitis with scabbing and +3 R +2 L Abdomen very distended with scrotal swelling Pt requesting medical needs for home and ensure shakes Weight- 257 lbs

## 2013-12-06 NOTE — Patient Instructions (Signed)
Pt instructed to start taking Lactulose TID and continue Lasix 40 mg tab BID [facility]

## 2013-12-09 ENCOUNTER — Telehealth: Payer: Self-pay

## 2013-12-09 NOTE — Telephone Encounter (Signed)
Paper work filled  Out and faxed back to home health aid Verbal order for wound check given

## 2013-12-10 ENCOUNTER — Inpatient Hospital Stay (HOSPITAL_COMMUNITY)
Admission: EM | Admit: 2013-12-10 | Discharge: 2013-12-30 | DRG: 405 | Disposition: A | Discharge status: Home / Self Care | Payer: Medicaid Other | Attending: Internal Medicine | Admitting: Internal Medicine

## 2013-12-10 ENCOUNTER — Encounter: Payer: Self-pay | Admitting: Gastroenterology

## 2013-12-10 ENCOUNTER — Encounter (HOSPITAL_COMMUNITY): Payer: Self-pay | Admitting: Emergency Medicine

## 2013-12-10 ENCOUNTER — Emergency Department (HOSPITAL_COMMUNITY): Payer: Medicaid Other

## 2013-12-10 ENCOUNTER — Ambulatory Visit (INDEPENDENT_AMBULATORY_CARE_PROVIDER_SITE_OTHER): Payer: Medicaid Other | Admitting: Gastroenterology

## 2013-12-10 VITALS — BP 130/80 | HR 70 | Ht 68.5 in

## 2013-12-10 DIAGNOSIS — R079 Chest pain, unspecified: Secondary | ICD-10-CM

## 2013-12-10 DIAGNOSIS — F1010 Alcohol abuse, uncomplicated: Secondary | ICD-10-CM | POA: Diagnosis present

## 2013-12-10 DIAGNOSIS — F17200 Nicotine dependence, unspecified, uncomplicated: Secondary | ICD-10-CM

## 2013-12-10 DIAGNOSIS — Z801 Family history of malignant neoplasm of trachea, bronchus and lung: Secondary | ICD-10-CM

## 2013-12-10 DIAGNOSIS — K319 Disease of stomach and duodenum, unspecified: Secondary | ICD-10-CM | POA: Diagnosis present

## 2013-12-10 DIAGNOSIS — Z888 Allergy status to other drugs, medicaments and biological substances status: Secondary | ICD-10-CM

## 2013-12-10 DIAGNOSIS — K7290 Hepatic failure, unspecified without coma: Secondary | ICD-10-CM

## 2013-12-10 DIAGNOSIS — R188 Other ascites: Secondary | ICD-10-CM

## 2013-12-10 DIAGNOSIS — T502X5A Adverse effect of carbonic-anhydrase inhibitors, benzothiadiazides and other diuretics, initial encounter: Secondary | ICD-10-CM | POA: Diagnosis not present

## 2013-12-10 DIAGNOSIS — F209 Schizophrenia, unspecified: Secondary | ICD-10-CM

## 2013-12-10 DIAGNOSIS — D638 Anemia in other chronic diseases classified elsewhere: Secondary | ICD-10-CM | POA: Diagnosis present

## 2013-12-10 DIAGNOSIS — K7682 Hepatic encephalopathy: Secondary | ICD-10-CM

## 2013-12-10 DIAGNOSIS — J189 Pneumonia, unspecified organism: Secondary | ICD-10-CM

## 2013-12-10 DIAGNOSIS — L0390 Cellulitis, unspecified: Secondary | ICD-10-CM

## 2013-12-10 DIAGNOSIS — B1920 Unspecified viral hepatitis C without hepatic coma: Secondary | ICD-10-CM

## 2013-12-10 DIAGNOSIS — K7030 Alcoholic cirrhosis of liver without ascites: Principal | ICD-10-CM

## 2013-12-10 DIAGNOSIS — I8501 Esophageal varices with bleeding: Secondary | ICD-10-CM

## 2013-12-10 DIAGNOSIS — K7460 Unspecified cirrhosis of liver: Secondary | ICD-10-CM

## 2013-12-10 DIAGNOSIS — IMO0001 Reserved for inherently not codable concepts without codable children: Secondary | ICD-10-CM

## 2013-12-10 DIAGNOSIS — D689 Coagulation defect, unspecified: Secondary | ICD-10-CM

## 2013-12-10 DIAGNOSIS — I509 Heart failure, unspecified: Secondary | ICD-10-CM | POA: Diagnosis present

## 2013-12-10 DIAGNOSIS — B1921 Unspecified viral hepatitis C with hepatic coma: Secondary | ICD-10-CM | POA: Diagnosis present

## 2013-12-10 DIAGNOSIS — E119 Type 2 diabetes mellitus without complications: Secondary | ICD-10-CM

## 2013-12-10 DIAGNOSIS — R279 Unspecified lack of coordination: Secondary | ICD-10-CM | POA: Diagnosis present

## 2013-12-10 DIAGNOSIS — Z23 Encounter for immunization: Secondary | ICD-10-CM

## 2013-12-10 DIAGNOSIS — Z8249 Family history of ischemic heart disease and other diseases of the circulatory system: Secondary | ICD-10-CM

## 2013-12-10 DIAGNOSIS — K766 Portal hypertension: Secondary | ICD-10-CM | POA: Diagnosis present

## 2013-12-10 DIAGNOSIS — D696 Thrombocytopenia, unspecified: Secondary | ICD-10-CM | POA: Diagnosis present

## 2013-12-10 DIAGNOSIS — I8500 Esophageal varices without bleeding: Secondary | ICD-10-CM

## 2013-12-10 DIAGNOSIS — R0602 Shortness of breath: Secondary | ICD-10-CM

## 2013-12-10 DIAGNOSIS — J9600 Acute respiratory failure, unspecified whether with hypoxia or hypercapnia: Secondary | ICD-10-CM

## 2013-12-10 DIAGNOSIS — F1410 Cocaine abuse, uncomplicated: Secondary | ICD-10-CM

## 2013-12-10 DIAGNOSIS — Z6831 Body mass index (BMI) 31.0-31.9, adult: Secondary | ICD-10-CM

## 2013-12-10 DIAGNOSIS — E871 Hypo-osmolality and hyponatremia: Secondary | ICD-10-CM

## 2013-12-10 DIAGNOSIS — M549 Dorsalgia, unspecified: Secondary | ICD-10-CM

## 2013-12-10 DIAGNOSIS — L0291 Cutaneous abscess, unspecified: Secondary | ICD-10-CM

## 2013-12-10 DIAGNOSIS — E41 Nutritional marasmus: Secondary | ICD-10-CM | POA: Diagnosis present

## 2013-12-10 DIAGNOSIS — R627 Adult failure to thrive: Secondary | ICD-10-CM | POA: Diagnosis present

## 2013-12-10 LAB — URINALYSIS, ROUTINE W REFLEX MICROSCOPIC
GLUCOSE, UA: NEGATIVE mg/dL
Hgb urine dipstick: NEGATIVE
KETONES UR: NEGATIVE mg/dL
LEUKOCYTES UA: NEGATIVE
Nitrite: NEGATIVE
PH: 6 (ref 5.0–8.0)
Protein, ur: NEGATIVE mg/dL
Specific Gravity, Urine: 1.036 — ABNORMAL HIGH (ref 1.005–1.030)
Urobilinogen, UA: 1 mg/dL (ref 0.0–1.0)

## 2013-12-10 LAB — CBC WITH DIFFERENTIAL/PLATELET
BASOS PCT: 1 % (ref 0–1)
Basophils Absolute: 0.1 10*3/uL (ref 0.0–0.1)
EOS PCT: 2 % (ref 0–5)
Eosinophils Absolute: 0.1 10*3/uL (ref 0.0–0.7)
HEMATOCRIT: 28.5 % — AB (ref 39.0–52.0)
HEMOGLOBIN: 9.8 g/dL — AB (ref 13.0–17.0)
LYMPHS PCT: 15 % (ref 12–46)
Lymphs Abs: 0.9 10*3/uL (ref 0.7–4.0)
MCH: 31.5 pg (ref 26.0–34.0)
MCHC: 34.4 g/dL (ref 30.0–36.0)
MCV: 91.6 fL (ref 78.0–100.0)
Monocytes Absolute: 1.4 10*3/uL — ABNORMAL HIGH (ref 0.1–1.0)
Monocytes Relative: 22 % — ABNORMAL HIGH (ref 3–12)
Neutro Abs: 3.8 10*3/uL (ref 1.7–7.7)
Neutrophils Relative %: 60 % (ref 43–77)
Platelets: 97 10*3/uL — ABNORMAL LOW (ref 150–400)
RBC: 3.11 MIL/uL — AB (ref 4.22–5.81)
RDW: 15.9 % — ABNORMAL HIGH (ref 11.5–15.5)
WBC: 6.3 10*3/uL (ref 4.0–10.5)

## 2013-12-10 LAB — COMPREHENSIVE METABOLIC PANEL
ALT: 44 U/L (ref 0–53)
AST: 77 U/L — ABNORMAL HIGH (ref 0–37)
Albumin: 2.3 g/dL — ABNORMAL LOW (ref 3.5–5.2)
Alkaline Phosphatase: 141 U/L — ABNORMAL HIGH (ref 39–117)
BUN: 19 mg/dL (ref 6–23)
CO2: 25 mEq/L (ref 19–32)
Calcium: 8.3 mg/dL — ABNORMAL LOW (ref 8.4–10.5)
Chloride: 94 mEq/L — ABNORMAL LOW (ref 96–112)
Creatinine, Ser: 0.99 mg/dL (ref 0.50–1.35)
GFR calc Af Amer: >90 mL/min (ref >90)
GLUCOSE: 111 mg/dL — AB (ref 70–99)
Potassium: 4 mEq/L (ref 3.7–5.3)
SODIUM: 130 meq/L — AB (ref 137–147)
TOTAL PROTEIN: 6.7 g/dL (ref 6.0–8.3)
Total Bilirubin: 1.1 mg/dL (ref 0.3–1.2)

## 2013-12-10 LAB — AMMONIA: Ammonia: 42 umol/L (ref 11–60)

## 2013-12-10 LAB — PROTIME-INR
INR: 1.9 — ABNORMAL HIGH (ref 0.00–1.49)
PROTHROMBIN TIME: 21.2 s — AB (ref 11.6–15.2)

## 2013-12-10 LAB — TROPONIN I
Troponin I: <0.3 ng/mL (ref <0.30)
Troponin I: <0.3 ng/mL (ref <0.30)

## 2013-12-10 LAB — PRO B NATRIURETIC PEPTIDE: Pro B Natriuretic peptide (BNP): 282.6 pg/mL — ABNORMAL HIGH (ref 0–125)

## 2013-12-10 LAB — APTT: aPTT: 37 seconds (ref 24–37)

## 2013-12-10 MED ORDER — FUROSEMIDE 10 MG/ML IJ SOLN
20.0000 mg | Freq: Once | INTRAMUSCULAR | Status: AC
  Administered 2013-12-10: 20 mg via INTRAVENOUS

## 2013-12-10 MED ORDER — ADULT MULTIVITAMIN W/MINERALS CH
1.0000 | ORAL_TABLET | Freq: Every day | ORAL | Status: DC
  Administered 2013-12-10 – 2013-12-30 (×20): 1 via ORAL
  Filled 2013-12-10 (×21): qty 1

## 2013-12-10 MED ORDER — SPIRONOLACTONE 100 MG PO TABS
200.0000 mg | ORAL_TABLET | Freq: Every day | ORAL | Status: DC
  Administered 2013-12-11 – 2013-12-18 (×8): 200 mg via ORAL
  Filled 2013-12-10 (×8): qty 2

## 2013-12-10 MED ORDER — FOLIC ACID 1 MG PO TABS
1.0000 mg | ORAL_TABLET | Freq: Every day | ORAL | Status: DC
  Administered 2013-12-10 – 2013-12-30 (×20): 1 mg via ORAL
  Filled 2013-12-10 (×21): qty 1

## 2013-12-10 MED ORDER — FOLIC ACID 1 MG PO TABS: 1.0000 mg | ORAL_TABLET | Freq: Every day | ORAL | Status: DC

## 2013-12-10 MED ORDER — FUROSEMIDE 80 MG PO TABS: 80.0000 mg | ORAL_TABLET | Freq: Two times a day (BID) | ORAL | Status: DC

## 2013-12-10 MED ORDER — FUROSEMIDE 10 MG/ML IJ SOLN: 20.0000 mg | Freq: Once | INTRAMUSCULAR | Status: DC

## 2013-12-10 MED ORDER — VANCOMYCIN HCL 10 G IV SOLR
2500.0000 mg | INTRAVENOUS | Status: AC
  Administered 2013-12-11: 2500 mg via INTRAVENOUS
  Filled 2013-12-10: qty 2500

## 2013-12-10 MED ORDER — PNEUMOCOCCAL VAC POLYVALENT 25 MCG/0.5ML IJ INJ
0.5000 mL | INJECTION | INTRAMUSCULAR | Status: AC
  Administered 2013-12-11: 0.5 mL via INTRAMUSCULAR
  Filled 2013-12-10 (×2): qty 0.5

## 2013-12-10 MED ORDER — SODIUM CHLORIDE 0.9 % IJ SOLN
3.0000 mL | Freq: Two times a day (BID) | INTRAMUSCULAR | Status: DC
  Administered 2013-12-10 – 2013-12-14 (×7): 3 mL via INTRAVENOUS
  Administered 2013-12-15: 10:00:00 via INTRAVENOUS
  Administered 2013-12-17 – 2013-12-28 (×13): 3 mL via INTRAVENOUS

## 2013-12-10 MED ORDER — VITAMIN B-1 100 MG PO TABS
100.0000 mg | ORAL_TABLET | Freq: Every day | ORAL | Status: DC
  Administered 2013-12-10 – 2013-12-30 (×20): 100 mg via ORAL
  Filled 2013-12-10 (×21): qty 1

## 2013-12-10 MED ORDER — FUROSEMIDE 10 MG/ML IJ SOLN
40.0000 mg | Freq: Once | INTRAMUSCULAR | Status: DC
  Filled 2013-12-10: qty 4

## 2013-12-10 MED ORDER — THIAMINE HCL 100 MG/ML IJ SOLN
100.0000 mg | Freq: Every day | INTRAMUSCULAR | Status: DC
  Filled 2013-12-10 (×2): qty 1

## 2013-12-10 MED ORDER — IOHEXOL 300 MG/ML  SOLN
50.0000 mL | Freq: Once | INTRAMUSCULAR | Status: AC | PRN
  Administered 2013-12-10: 50 mL via ORAL

## 2013-12-10 MED ORDER — DEXTROSE 5 % IV SOLN
1.0000 g | Freq: Three times a day (TID) | INTRAVENOUS | Status: DC
  Administered 2013-12-11 – 2013-12-12 (×4): 1 g via INTRAVENOUS
  Filled 2013-12-10 (×5): qty 1

## 2013-12-10 MED ORDER — ENSURE COMPLETE PO LIQD
237.0000 mL | Freq: Three times a day (TID) | ORAL | Status: DC
  Filled 2013-12-10 (×2): qty 237

## 2013-12-10 MED ORDER — IOHEXOL 300 MG/ML  SOLN
100.0000 mL | Freq: Once | INTRAMUSCULAR | Status: AC | PRN
  Administered 2013-12-10: 100 mL via INTRAVENOUS

## 2013-12-10 MED ORDER — FUROSEMIDE 10 MG/ML IJ SOLN: 40.0000 mg | Freq: Once | INTRAMUSCULAR | Status: DC

## 2013-12-10 MED ORDER — HEPARIN SODIUM (PORCINE) 5000 UNIT/ML IJ SOLN: 5000.0000 [IU] | Freq: Three times a day (TID) | INTRAMUSCULAR | Status: DC

## 2013-12-10 MED ORDER — VITAMIN B-1 100 MG PO TABS
100.0000 mg | ORAL_TABLET | Freq: Every day | ORAL | Status: DC
  Filled 2013-12-10: qty 1

## 2013-12-10 MED ORDER — LORAZEPAM 2 MG/ML IJ SOLN
1.0000 mg | Freq: Four times a day (QID) | INTRAMUSCULAR | Status: AC | PRN
  Administered 2013-12-11 (×2): 1 mg via INTRAVENOUS
  Filled 2013-12-10 (×3): qty 1

## 2013-12-10 MED ORDER — PANTOPRAZOLE SODIUM 40 MG PO TBEC
40.0000 mg | DELAYED_RELEASE_TABLET | Freq: Two times a day (BID) | ORAL | Status: DC
  Administered 2013-12-11 – 2013-12-30 (×38): 40 mg via ORAL
  Filled 2013-12-10 (×43): qty 1

## 2013-12-10 MED ORDER — FUROSEMIDE 80 MG PO TABS
80.0000 mg | ORAL_TABLET | Freq: Every day | ORAL | Status: DC
Start: 2013-12-11 — End: 2013-12-10

## 2013-12-10 MED ORDER — LACTULOSE 10 GM/15ML PO SOLN
20.0000 g | Freq: Three times a day (TID) | ORAL | Status: DC
  Administered 2013-12-10 – 2013-12-14 (×11): 20 g via ORAL
  Filled 2013-12-10 (×13): qty 30

## 2013-12-10 MED ORDER — VANCOMYCIN HCL IN DEXTROSE 1-5 GM/200ML-% IV SOLN
1000.0000 mg | Freq: Two times a day (BID) | INTRAVENOUS | Status: DC
  Administered 2013-12-11 – 2013-12-12 (×3): 1000 mg via INTRAVENOUS
  Filled 2013-12-10 (×3): qty 200

## 2013-12-10 MED ORDER — NICOTINE 14 MG/24HR TD PT24
14.0000 mg | MEDICATED_PATCH | Freq: Every day | TRANSDERMAL | Status: DC
  Administered 2013-12-10 – 2013-12-30 (×15): 14 mg via TRANSDERMAL
  Filled 2013-12-10 (×21): qty 1

## 2013-12-10 MED ORDER — IPRATROPIUM BROMIDE 0.02 % IN SOLN
0.5000 mg | Freq: Once | RESPIRATORY_TRACT | Status: AC
  Administered 2013-12-10: 0.5 mg via RESPIRATORY_TRACT
  Filled 2013-12-10: qty 2.5

## 2013-12-10 MED ORDER — SPIRONOLACTONE 100 MG PO TABS: 200.0000 mg | ORAL_TABLET | Freq: Every day | ORAL | Status: DC

## 2013-12-10 MED ORDER — ONDANSETRON HCL 4 MG PO TABS
4.0000 mg | ORAL_TABLET | Freq: Four times a day (QID) | ORAL | Status: DC | PRN
  Administered 2013-12-26: 4 mg via ORAL
  Filled 2013-12-10: qty 1

## 2013-12-10 MED ORDER — DEXTROSE 5 % IV SOLN
2.0000 g | INTRAVENOUS | Status: AC
  Administered 2013-12-10: 2 g via INTRAVENOUS
  Filled 2013-12-10: qty 2

## 2013-12-10 MED ORDER — ONDANSETRON HCL 4 MG/2ML IJ SOLN: 4.0000 mg | Freq: Four times a day (QID) | INTRAMUSCULAR | Status: DC | PRN

## 2013-12-10 MED ORDER — FUROSEMIDE 80 MG PO TABS
80.0000 mg | ORAL_TABLET | Freq: Two times a day (BID) | ORAL | Status: DC
  Administered 2013-12-11: 80 mg via ORAL
  Filled 2013-12-10 (×3): qty 1

## 2013-12-10 MED ORDER — LORAZEPAM 1 MG PO TABS
1.0000 mg | ORAL_TABLET | Freq: Four times a day (QID) | ORAL | Status: AC | PRN
  Administered 2013-12-12 – 2013-12-13 (×3): 1 mg via ORAL
  Filled 2013-12-10 (×3): qty 1

## 2013-12-10 MED ORDER — DEXTROSE 5 % IV SOLN
500.0000 mg | Freq: Once | INTRAVENOUS | Status: DC
  Administered 2013-12-10: 500 mg via INTRAVENOUS

## 2013-12-10 MED ORDER — ALBUTEROL SULFATE (2.5 MG/3ML) 0.083% IN NEBU
5.0000 mg | INHALATION_SOLUTION | Freq: Once | RESPIRATORY_TRACT | Status: AC
Start: 2013-12-10 — End: 2013-12-10
  Administered 2013-12-10: 5 mg via RESPIRATORY_TRACT
  Filled 2013-12-10: qty 6

## 2013-12-10 NOTE — Progress Notes (Signed)
HPI: This is a very pleasant 53 year old man whom I last saw about a month ago when he was hospitalized at [facility].   Copied from recent d/c summary 65/7064: 53 year old male with apparent history of prior alcohol abuse and schizophrenia who lives at home with his sister. Was brought to the ER because of confusion, agitation and at time of admission he was unable to give history. According to his family and had several episodes of hematemesis and had vomited a moderate amount of bright red blood with clots. He was also having melanotic stool. In the ER he was agitated enough to require restraints as well as administration of Ativan and Haldol. Unknown how much alcohol the patient utilizes. In the ER the patient had sinus tachycardia with rates in the 120s, BP was stable but 120/70. Hemoglobin stable at 10, BUN mildly elevated at 33 with a normal creatinine. His ammonia level was 75 and his INR was mildly elevated at 1.6. Urine drug screen was positive for cocaine. GI had artery been consulted by the emergency room physician.  After admission the patient was taken to the endoscopy lab but the procedure could not be performed initially because the patient developed acute respiratory distress. During intubation attempts the esophagus was inadvertently intubated with copious amounts of melena or bright red blood obtained. The patient was is currently successfully intubated and sent to the ICU under PCCM.  Once the patient stabilized he underwent an EGD that revealed varices and portal gastropathy. He underwent a banding procedure. Initial CT the abdomen showed gallbladder wall edema concerning for possible cholecystitis which was likely related to patient's underlying cirrhosis. Surgery was consulted and did not feel the patient had cholecystitis.  Patient did require Precedex infusion which was weaned and the patient was successfully extubated but unfortunately had intermittent agitation and  combative behaviors so low-dose Precedex was resumed. With the addition of Seroquel patient was successfully weaned from the Precedex and agitated and combative behaviors have resolved.  Patient stabilized and was deemed appropriate to transfer to the general medical floor on 11/13/2013  Since leaving the hospital he has gradually had worsening edema, ascites. He is unable to stand up now. He was wheeled in by his twin sister. He clearly has massive ascites and 3+ pitting edema. He feels short of breath. He has had no fevers or chills.   He is not sure if he is taking aldactone;   Current med list states aldactone 50mg  per day, lasix 80mg  per day.  Drinking water daily.  Last sip of alcohol was 2-3 days ago.  Cocaine, last was prior to his hosp stay.  Takes goody's 1-3 per day.  Says he is taking his pills regularly.  His twin sister tells me that she is  Not really sure if he is taking them.  He tells me he is extremely uncomfortable in his legs, his abdomen hurts, he is having hard time breathing   Past Medical History  Diagnosis Date  . Diabetes mellitus without complication   . Schizophrenia     onset age 30.  in and out of psych facilities from age 43 to age 75.  s/p electroconvulsive therapy.  . TB (tuberculosis)     sister says this dx'd at age 12 and treated  . Hepatic encephalopathy   . Esophageal varices   . GI bleed   . Cirrhosis   . Thrombocytopenia   . Alcohol abuse     Past Surgical History  Procedure Laterality  Date  . Cataract surgery  Bilateral   . Esophagogastroduodenoscopy N/A 11/08/2013    Procedure: ESOPHAGOGASTRODUODENOSCOPY (EGD);  Surgeon: Milus Banister, MD;  Location: [place];  Service: Endoscopy;  Laterality: N/A;  bedside    Current Outpatient Prescriptions  Medication Sig Dispense Refill  . beclomethasone (QVAR) 80 MCG/ACT inhaler Inhale 1 puff into the lungs 2 (two) times daily.      . folic acid (FOLVITE) 1 MG tablet Take 1 tablet (1  mg total) by mouth daily.  90 tablet  3  . furosemide (LASIX) 40 MG tablet Take 1 tablet (40 mg total) by mouth 2 (two) times daily.  30 tablet  0  . lactulose (CHRONULAC) 10 GM/15ML solution Take 30 mLs (20 g total) by mouth 3 (three) times daily.  960 mL  3  . Nutritional Supplements (ENSURE HIGH PROTEIN) LIQD Take 20 mls twice daily  12 Can  6  . pantoprazole (PROTONIX) 40 MG tablet Take 40 mg by mouth 2 (two) times daily.      Marland Kitchen sulfamethoxazole-trimethoprim (BACTRIM DS) 800-160 MG per tablet Take 1 tablet by mouth 2 (two) times daily.  14 tablet  0  . thiamine (VITAMIN B-1) 100 MG tablet Take 1 tablet (100 mg total) by mouth daily.  90 tablet  3  . spironolactone (ALDACTONE) 100 MG tablet Take 2 tablets (200 mg total) by mouth daily.  60 tablet  11   No current facility-administered medications for this visit.    Allergies as of 12/10/2013 - Review Complete 12/10/2013  Allergen Reaction Noted  . Haldol [haloperidol]  11/21/2013    Family History  Problem Relation Age of Onset  . Hypertension Mother   . Colon cancer Neg Hx   . Lung cancer Mother     smoker  . Colon polyps Sister     twin sister    History   Social History  . Marital Status: Single    Spouse Name: N/A    Number of Children: 0  . Years of Education: N/A   Occupational History  . Not on file.   Social History Main Topics  . Smoking status: Current Some Day Smoker -- 0.50 packs/day for 25 years    Types: Cigarettes  . Smokeless tobacco: Never Used     Comment: Tobacco information given 12/10/12  . Alcohol Use: Yes     Comment:  drink beer socially   . Drug Use: Yes    Special: Cocaine     Comment: not currently  . Sexual Activity: Not on file   Other Topics Concern  . Not on file   Social History Narrative  . No narrative on file      Physical Exam: BP 130/80  Pulse 70  Ht 5' 8.5" (1.74 m), unable to stand to be weighed, 96% RA Constitutional: Chronically ill-appearing, sits in  wheelchair, slightly tachypneic Psychiatric: alert and oriented x3 Abdomen: Massive, tense ascites, no peritoneal signs, normal bowel sounds 2+ pitting edema up to his thighs Lungs rales and dullness to percussion about one quarter the way up both sides of his chest posteriorly   Assessment and plan: 53 y.o. male with cirrhosis decompensated  He has obvious massive ascites and fluid overload right now.  I do not think it is safe for him to go home, he is clearly short of breath.  I have instructed him to go directly to the emergency room across the street at [facility]. I expect he will need IV diuresis.  He has been taking Goody powders several times a day which may be worsening his fluid retention.  There is also clear question about whether he has been taking his prescribed diuretics. He also continues to drink beer although not at high volumes. He will be due for repeat EGD sometime in the next few weeks for his esophageal varices.

## 2013-12-10 NOTE — ED Provider Notes (Signed)
CSN: [id number]     Arrival date & time 12/10/13  1552 History   First MD Initiated Contact with Patient 12/10/13 1557     Chief Complaint  Patient presents with  . Shortness of Breath     (Consider location/radiation/quality/duration/timing/severity/associated sxs/prior Treatment) The history is provided by the patient and the spouse. No language interpreter was used.  RASHEED KARGES is a 53 y/o M with PMHx of DM, schizophrenia, TB, hepatic encephalopathy, GI bleed, cirrhosis, alcohol abuse, thrombocytopenia who is admitted to the hospital on 11/07/2013 to 11/15/2013 regarding hepatic encephalopathy presenting to emergency department with shortness of breath and abdominal pain does been ongoing for the past couple of weeks. Patient reported that there is "too much fluid" in his body. Reported that he's noticed swelling all over his body-mostly to his legs. Stated that he has been unable to stand up without having shortness of breath. Stated that he's been experiencing wheezes-reported that he normally uses albuterol daily but has not used any albuterol today. Stated that he's been experiencing chest pain localize the left side described as a "little pain", "zing" that last less than a couple of seconds usually occurring when the patient is getting up from a chair or from a sitting position. Patient reported that he is unable to lay flat due to being unable to breathe. Patient reported that he's noticed swelling to his abdomen is gone progressively larger-stated that he's been experiencing abdominal pain described as a constant throbbing aching sensation, bloating tightness. Reported that he has been taking his Lasix every day as prescribed. Denied having increased urination. Reported that he was seen and assessed by gastroenterologist this morning who recommended patient to come to the emergency department to be assessed and most likely admitted secondary to fluid overload. Patient reported he  continues to drink alcohol-reported that he hasn't had alcoholic beverage was in the past 3 days, but then explains that he takes a sip of alcohol daily. Reported that he's noticed increasing weight. Reported intermittent diarrhea secondary to lactulose. Denied fevers, chills, urinary symptoms, sweating, nausea, vomiting, melena, hematochezia. PCP Dr. Doreene Burke GI Dr. Ardis Hughs  Past Medical History  Diagnosis Date  . Diabetes mellitus without complication   . Schizophrenia     onset age 55.  in and out of psych facilities from age 37 to age 72.  s/p electroconvulsive therapy.  . TB (tuberculosis)     sister says this dx'd at age 43 and treated  . Hepatic encephalopathy   . Esophageal varices   . GI bleed   . Cirrhosis   . Thrombocytopenia   . Alcohol abuse    Past Surgical History  Procedure Laterality Date  . Cataract surgery  Bilateral   . Esophagogastroduodenoscopy N/A 11/08/2013    Procedure: ESOPHAGOGASTRODUODENOSCOPY (EGD);  Surgeon: Milus Banister, MD;  Location: [facility];  Service: Endoscopy;  Laterality: N/A;  bedside   Family History  Problem Relation Age of Onset  . Hypertension Mother   . Colon cancer Neg Hx   . Lung cancer Mother     smoker  . Colon polyps Sister     twin sister   History  Substance Use Topics  . Smoking status: Current Some Day Smoker -- 0.50 packs/day for 25 years    Types: Cigarettes  . Smokeless tobacco: Never Used     Comment: Tobacco information given 12/10/12  . Alcohol Use: Yes     Comment:  drink beer socially     Review of Systems  Constitutional: Negative for fever and chills.  Respiratory: Positive for chest tightness and shortness of breath. Negative for cough.   Cardiovascular: Positive for chest pain and leg swelling.  Gastrointestinal: Positive for abdominal pain, diarrhea (on lactulose) and abdominal distention. Negative for nausea, vomiting, constipation, blood in stool and anal bleeding.  Musculoskeletal: Negative for back  pain and neck pain.  Neurological: Negative for weakness.  All other systems reviewed and are negative.      Allergies  Haldol  Home Medications   Current Outpatient Rx  Name  Route  Sig  Dispense  Refill  . beclomethasone (QVAR) 80 MCG/ACT inhaler   Inhalation   Inhale 2 puffs into the lungs 2 (two) times daily.          . folic acid (FOLVITE) 1 MG tablet   Oral   Take 1 tablet (1 mg total) by mouth daily.   90 tablet   3   . furosemide (LASIX) 40 MG tablet   Oral   Take 1 tablet (40 mg total) by mouth 2 (two) times daily.   30 tablet   0   . lactulose (CHRONULAC) 10 GM/15ML solution   Oral   Take 30 mLs (20 g total) by mouth 3 (three) times daily.   960 mL   3   . Nutritional Supplements (ENSURE HIGH PROTEIN) LIQD      Take 20 mls twice daily   12 Can   6   . pantoprazole (PROTONIX) 40 MG tablet   Oral   Take 40 mg by mouth 2 (two) times daily.         Marland Kitchen spironolactone (ALDACTONE) 100 MG tablet   Oral   Take 2 tablets (200 mg total) by mouth daily.   60 tablet   11   . sulfamethoxazole-trimethoprim (BACTRIM DS) 800-160 MG per tablet   Oral   Take 1 tablet by mouth 2 (two) times daily.   14 tablet   0   . thiamine (VITAMIN B-1) 100 MG tablet   Oral   Take 1 tablet (100 mg total) by mouth daily.   90 tablet   3    BP 117/63  Pulse 98  Temp(Src) 97.3 F (36.3 C) (Oral)  Resp 28  Ht 5' 8.5" (1.74 m)  Wt 257 lb 0.9 oz (116.6 kg)  BMI 38.51 kg/m2  SpO2 100% Physical Exam  Nursing note and vitals reviewed. Constitutional: He is oriented to person, place, and time. He appears well-developed and well-nourished. No distress.  HENT:  Head: Normocephalic and atraumatic.  Mouth/Throat: Oropharynx is clear and moist. No oropharyngeal exudate.  Eyes: Conjunctivae and EOM are normal. Pupils are equal, round, and reactive to light. Right eye exhibits no discharge. Left eye exhibits no discharge.  Neck: Normal range of motion. Neck supple. No  tracheal deviation present.  Cardiovascular: Normal rate, regular rhythm and normal heart sounds.   Pulses:      Radial pulses are 2+ on the right side, and 2+ on the left side.  Swelling localized to the lower extremities bilaterally Pitting edema 2+ to lower extremities from dorsal aspect of the feet bilaterally up to the knees bilaterally  Pulmonary/Chest: He has wheezes. He has no rales.  Increased effort to breath Expiratory wheezes noted Decreased breath sounds to upper and lower lobes bilaterally  Abdominal: He exhibits distension. He exhibits no shifting dullness, no pulsatile liver, no fluid wave and no ascites. Bowel sounds are decreased. There is generalized tenderness.  Abdominal distension  noted with diffuse tenderness upon palpation to the abdomen Hard upon palpation to the abdomen  Musculoskeletal: Normal range of motion.  Full ROM to upper and lower extremities without difficulty noted, negative ataxia noted  Lymphadenopathy:    He has no cervical adenopathy.  Neurological: He is alert and oriented to person, place, and time. He exhibits normal muscle tone. Coordination normal.  Cranial nerves III-XII grossly intact Strength 5+/5+ to upper and lower extremities bilaterally with resistance applied, equal distribution noted  Skin: Skin is warm and dry. No rash noted. He is not diaphoretic. No erythema.  Psychiatric: He has a normal mood and affect. His behavior is normal. Thought content normal.    ED Course  Procedures (including critical care time)  7:47 PM This provider spoke with Dr. Allena Katz, [facility] hospitalist-discussed case, history, presentation, labs, imaging. Patient to be admitted to the hospital for congestive heart failure laceration, pneumonia, fluid overload. Recommended another round of Lasix as per admitting physician.  Improvement noted to the expiratory wheezes - responded well to albuterol and ipratropium neb treatment.  Discussed plan for admission the  patient agreed to plan of care.  Results for orders placed during the hospital encounter of 12/10/13  CBC WITH DIFFERENTIAL      Result Value Range   WBC 6.3  4.0 - 10.5 K/uL   RBC 3.11 (*) 4.22 - 5.81 MIL/uL   Hemoglobin 9.8 (*) 13.0 - 17.0 g/dL   HCT 57.2 (*) 62.0 - 35.5 %   MCV 91.6  78.0 - 100.0 fL   MCH 31.5  26.0 - 34.0 pg   MCHC 34.4  30.0 - 36.0 g/dL   RDW 97.4 (*) 16.3 - 84.5 %   Platelets 97 (*) 150 - 400 K/uL   Neutrophils Relative % 60  43 - 77 %   Lymphocytes Relative 15  12 - 46 %   Monocytes Relative 22 (*) 3 - 12 %   Eosinophils Relative 2  0 - 5 %   Basophils Relative 1  0 - 1 %   Neutro Abs 3.8  1.7 - 7.7 K/uL   Lymphs Abs 0.9  0.7 - 4.0 K/uL   Monocytes Absolute 1.4 (*) 0.1 - 1.0 K/uL   Eosinophils Absolute 0.1  0.0 - 0.7 K/uL   Basophils Absolute 0.1  0.0 - 0.1 K/uL   Smear Review LARGE PLATELETS PRESENT    COMPREHENSIVE METABOLIC PANEL      Result Value Range   Sodium 130 (*) 137 - 147 mEq/L   Potassium 4.0  3.7 - 5.3 mEq/L   Chloride 94 (*) 96 - 112 mEq/L   CO2 25  19 - 32 mEq/L   Glucose, Bld 111 (*) 70 - 99 mg/dL   BUN 19  6 - 23 mg/dL   Creatinine, Ser 3.64  0.50 - 1.35 mg/dL   Calcium 8.3 (*) 8.4 - 10.5 mg/dL   Total Protein 6.7  6.0 - 8.3 g/dL   Albumin 2.3 (*) 3.5 - 5.2 g/dL   AST 77 (*) 0 - 37 U/L   ALT 44  0 - 53 U/L   Alkaline Phosphatase 141 (*) 39 - 117 U/L   Total Bilirubin 1.1  0.3 - 1.2 mg/dL   GFR calc non Af Amer >90  >90 mL/min   GFR calc Af Amer >90  >90 mL/min  TROPONIN I      Result Value Range   Troponin I <0.30  <0.30 ng/mL  PRO B NATRIURETIC PEPTIDE  Result Value Range   Pro B Natriuretic peptide (BNP) 282.6 (*) 0 - 125 pg/mL  URINALYSIS, ROUTINE W REFLEX MICROSCOPIC      Result Value Range   Color, Urine AMBER (*) YELLOW   APPearance CLEAR  CLEAR   Specific Gravity, Urine 1.036 (*) 1.005 - 1.030   pH 6.0  5.0 - 8.0   Glucose, UA NEGATIVE  NEGATIVE mg/dL   Hgb urine dipstick NEGATIVE  NEGATIVE   Bilirubin Urine  SMALL (*) NEGATIVE   Ketones, ur NEGATIVE  NEGATIVE mg/dL   Protein, ur NEGATIVE  NEGATIVE mg/dL   Urobilinogen, UA 1.0  0.0 - 1.0 mg/dL   Nitrite NEGATIVE  NEGATIVE   Leukocytes, UA NEGATIVE  NEGATIVE   Dg Chest 2 View  12/10/2013   CLINICAL DATA:  Shortness of breath.  EXAM: CHEST  2 VIEW  COMPARISON:  November 12, 2013.  FINDINGS: The heart size and mediastinal contours are within normal limits. Left lung is clear. Mild right basilar opacity is noted concerning for pneumonia or atelectasis. Faint linear right upper lobe opacity is noted concerning for subsegmental atelectasis or possibly pneumonia. The visualized skeletal structures are unremarkable.  IMPRESSION: Right lower and upper lobe opacities are noted concerning for pneumonia or atelectasis. Followup radiographs are recommended.   Electronically Signed   By: Sabino Dick M.D.   On: 12/10/2013 16:45   Ct Abdomen Pelvis W Contrast  12/10/2013   CLINICAL DATA:  Abdomen pain and distention  EXAM: CT ABDOMEN AND PELVIS WITH CONTRAST  TECHNIQUE: Multidetector CT imaging of the abdomen and pelvis was performed using the standard protocol following bolus administration of intravenous contrast.  CONTRAST:  189mL OMNIPAQUE IOHEXOL 300 MG/ML  SOLN  COMPARISON:  November 09, 2013  FINDINGS: There is interval developed marked ascites in the abdomen and pelvis. There is nodular contour of the liver consistent with known cirrhosis of liver. Gallstones are noted in the gallbladder. Spleen is stable. The pancreas adrenal glands and kidneys are normal. There is no hydronephrosis bilaterally. There is mild atherosclerosis of the abdominal aorta without aneurysmal dilatation. There is no small bowel obstruction or diverticulitis. Fluid and stranding is identified within the subcutaneous fat of the abdomen and pelvis. There is moderate right pleural effusion with compression atelectasis of adjacent posterior right lung base. The left lung is clear. Degenerative joint  changes of the spine are noted.  IMPRESSION: Interval developed marked ascites in the abdomen and pelvis. Cirrhosis of liver. Cholelithiasis.   Electronically Signed   By: Abelardo Diesel M.D.   On: 12/10/2013 19:04    Labs Review Labs Reviewed  CBC WITH DIFFERENTIAL - Abnormal; Notable for the following:    RBC 3.11 (*)    Hemoglobin 9.8 (*)    HCT 28.5 (*)    RDW 15.9 (*)    Platelets 97 (*)    Monocytes Relative 22 (*)    Monocytes Absolute 1.4 (*)    All other components within normal limits  COMPREHENSIVE METABOLIC PANEL - Abnormal; Notable for the following:    Sodium 130 (*)    Chloride 94 (*)    Glucose, Bld 111 (*)    Calcium 8.3 (*)    Albumin 2.3 (*)    AST 77 (*)    Alkaline Phosphatase 141 (*)    All other components within normal limits  PRO B NATRIURETIC PEPTIDE - Abnormal; Notable for the following:    Pro B Natriuretic peptide (BNP) 282.6 (*)    All other components  within normal limits  URINALYSIS, ROUTINE W REFLEX MICROSCOPIC - Abnormal; Notable for the following:    Color, Urine AMBER (*)    Specific Gravity, Urine 1.036 (*)    Bilirubin Urine SMALL (*)    All other components within normal limits  TROPONIN I  AMMONIA  TROPONIN I   Imaging Review Dg Chest 2 View  12/10/2013   CLINICAL DATA:  Shortness of breath.  EXAM: CHEST  2 VIEW  COMPARISON:  November 12, 2013.  FINDINGS: The heart size and mediastinal contours are within normal limits. Left lung is clear. Mild right basilar opacity is noted concerning for pneumonia or atelectasis. Faint linear right upper lobe opacity is noted concerning for subsegmental atelectasis or possibly pneumonia. The visualized skeletal structures are unremarkable.  IMPRESSION: Right lower and upper lobe opacities are noted concerning for pneumonia or atelectasis. Followup radiographs are recommended.   Electronically Signed   By: Sabino Dick M.D.   On: 12/10/2013 16:45   Ct Abdomen Pelvis W Contrast  12/10/2013   CLINICAL DATA:   Abdomen pain and distention  EXAM: CT ABDOMEN AND PELVIS WITH CONTRAST  TECHNIQUE: Multidetector CT imaging of the abdomen and pelvis was performed using the standard protocol following bolus administration of intravenous contrast.  CONTRAST:  181mL OMNIPAQUE IOHEXOL 300 MG/ML  SOLN  COMPARISON:  November 09, 2013  FINDINGS: There is interval developed marked ascites in the abdomen and pelvis. There is nodular contour of the liver consistent with known cirrhosis of liver. Gallstones are noted in the gallbladder. Spleen is stable. The pancreas adrenal glands and kidneys are normal. There is no hydronephrosis bilaterally. There is mild atherosclerosis of the abdominal aorta without aneurysmal dilatation. There is no small bowel obstruction or diverticulitis. Fluid and stranding is identified within the subcutaneous fat of the abdomen and pelvis. There is moderate right pleural effusion with compression atelectasis of adjacent posterior right lung base. The left lung is clear. Degenerative joint changes of the spine are noted.  IMPRESSION: Interval developed marked ascites in the abdomen and pelvis. Cirrhosis of liver. Cholelithiasis.   Electronically Signed   By: Abelardo Diesel M.D.   On: 12/10/2013 19:04    EKG Interpretation    Date/Time:  Tuesday December 10 2013 15:58:17 EST Ventricular Rate:  107 PR Interval:  114 QRS Duration: 92 QT Interval:  354 QTC Calculation: 472 R Axis:   72 Text Interpretation:  Sinus tachycardia Confirmed by WARD  DO, KRISTEN ([id number]) on 12/10/2013 6:20:25 PM             MDM   Final diagnoses:  CHF exacerbation  Ascites  Cirrhosis  Esophageal varices  Shortness of breath  Chest pain  HCAP (healthcare-associated pneumonia)   Medications  azithromycin (ZITHROMAX) 500 mg in dextrose 5 % 250 mL IVPB (not administered)  pneumococcal 23 valent vaccine (PNU-IMMUNE) injection 0.5 mL (not administered)  furosemide (LASIX) injection 20 mg (not administered)   ceFEPIme (MAXIPIME) 2 g in dextrose 5 % 50 mL IVPB (not administered)  ceFEPIme (MAXIPIME) 1 g in dextrose 5 % 50 mL IVPB (not administered)  albuterol (PROVENTIL) (2.5 MG/3ML) 0.083% nebulizer solution 5 mg (5 mg Nebulization Given 12/10/13 1744)  ipratropium (ATROVENT) nebulizer solution 0.5 mg (0.5 mg Nebulization Given 12/10/13 1745)  furosemide (LASIX) injection 20 mg (20 mg Intravenous Given 12/10/13 1743)  iohexol (OMNIPAQUE) 300 MG/ML solution 50 mL (50 mLs Oral Contrast Given 12/10/13 1818)  iohexol (OMNIPAQUE) 300 MG/ML solution 100 mL (100 mLs Intravenous Contrast Given 12/10/13  1843)   Filed Vitals:   12/10/13 1600 12/10/13 1749 12/10/13 1754 12/10/13 1951  BP:  133/75 117/63   Pulse:   98   Temp:      TempSrc:      Resp:      Height:    5' 8.5" (1.74 m)  Weight:    257 lb 0.9 oz (116.6 kg)  SpO2: 100%  100%     Patient presenting to emergency department with shortness of breath, fluid overload, abdominal discomfort this is been ongoing for the past couple of weeks. Stated that the shortness of breath is constant worse when going from sitting to standing position with associated chest pain localized left-sided chest described as a "zing" that last less than a couple seconds. Patient reported that he's noticed increased fluid in his legs and hands. Reported that he has noticed increased distention of his abdomen along with abdominal pain described as a bloating, tightness, aching sensation that is constant. Reported that the abdomen is hard to the touch. Reported that he was seen and assessed by his gastroenterologist today who recommended patient to be seen and admitted to the hospital for fluid overload. Stated that he's been taking his Lasix as prescribed. Denied changes to appetite. Denied fever, chills, sweating. This provider reviewed patient's chart. Patient was seen and assessed and admitted to the hospital in January 2015 regarding hepatic encephalopathy. Patient reports he  continues to drink alcohol- takes a sip daily. Patient is discharged from the hospital in 11/15/2013. Alert and oriented. GCS 15. Heart rate noted to be mildly tachycardic and rhythm normal. Lungs with decreased breath sounds upper lower lobes-increased effort to breathe. Expiratory wheezes noted. Patient currently on nasal cannula fee at 2 L. Cap refill less than 3 seconds. Significant swelling localized to lower extremities bilaterally with 2+ pitting edema from the dorsal aspect of the feet up to the knees bilaterally. Full range of motion to upper and lower extremities bilaterally without difficulty or ataxia. Abdomen distended with decreased bowel sounds, hard upon palpation with diffuse tenderness upon palpation. EKG sinus tachycardia with a heart rate of 107 beats per minute-negative ischemic findings noted. Troponin negative elevation. CBC noted-negative elevation white blood cell count. Low platelet level-osteopenia 97-patient has history of this. Hemoglobin 9.8, hematocrit 28.5 - mild decrease, not significant when compared to 2 weeks ago. Increase in the blood cells from 2.94 to 3.11 over two week time span. BMP elevated at 282.6. Urinalysis noted small bilirubin-negative nitrites and leukocytes noted. CMP noted mild low sodium of 130-low chloride of 94. Anion gap of 11.2 mEq per liter. Chest x-ray noted right lower and upper lobe opacities that are concerning for pneumonia versus atelectasis. Negative signs of cardiomegaly or pleural effusion. CT abdomen and pelvis noted interval development of ascites the abdomen and pelvis, cirrhosis liver, cholelithiasis. Patient presenting to emergency department with pneumonia, congestive heart failure exacerbation, fluid overload, cirrhosis. Patient to be admitted to the hospital. Patient admitted to MedSurg under the care of Dr. Posey Pronto. Patient started on IV antibiotics. Patient agreed to plan of admission. Patient stable, afebrile. Patient stable for  transfer.  Jamse Mead, PA-C 12/11/13 1259

## 2013-12-10 NOTE — H&P (Addendum)
[facility] History and Physical  Patient: Oscar Swanson  [id number]  DOB: 1961-03-04  DOS: the patient was seen and examined on 12/10/2013 PCP: Angelica Chessman, MD  Chief Complaint: Abdominal distention  HPI: TEO WEISEL is a 53 y.o. male with Past medical history of alcohol cirrhosis, hepatic encephalopathy, active smoker, esophageal varices, chronic thrombocytopenia. The patient is coming from home. The patient presented with complaints of abdominal distention and shortness of breath. She was seen today at GI clinic and was recommended for admission for tense ascites. Patient has history of alcoholic liver cirrhosis and was recently admitted for community-acquired pneumonia herpetic encephalopathy and upper GI bleeding. As per the family member after the discharge she has always been having trouble breathing but since last 2 weeks the symptom has worsened progressively. He has orthopnea and PND and has been using increased number of pillows since last 3 days. He also had some chest pain without any palpitation few days ago but at present resolved. He complains of some cough with yellowish expectoration without any fever. He has bilateral leg swelling which worsened and wasn't significantly red one week ago for which she was seen in the community clinic and was placed on oral Bactrim. He mentions since discharge that has been progressive distention of his abdomen although he denies any nausea vomiting acid reflux diarrhea or constipation. He denies any active bleeding. He denies any burning urination. Since last one week the distention has worsened. He mentions he is compliant with his Lasix Aldactone and lactulose, although family mentions he can get confused at times and he does his medication on his own. He continues to drink 2 beers a day last drink was 3 days ago. He continues to smoke 2 pack a day.  Review of Systems: as mentioned in the history of present illness.   A Comprehensive review of the other systems is negative.  Past Medical History  Diagnosis Date  . Diabetes mellitus without complication   . Schizophrenia     onset age 38.  in and out of psych facilities from age 70 to age 62.  s/p electroconvulsive therapy.  . TB (tuberculosis)     sister says this dx'd at age 19 and treated  . Hepatic encephalopathy   . Esophageal varices   . GI bleed   . Cirrhosis   . Thrombocytopenia   . Alcohol abuse    Past Surgical History  Procedure Laterality Date  . Cataract surgery  Bilateral   . Esophagogastroduodenoscopy N/A 11/08/2013    Procedure: ESOPHAGOGASTRODUODENOSCOPY (EGD);  Surgeon: Milus Banister, MD;  Location: [facility];  Service: Endoscopy;  Laterality: N/A;  bedside   Social History:  reports that he has been smoking Cigarettes.  He has a 12.5 pack-year smoking history. He has never used smokeless tobacco. He reports that he drinks alcohol. He reports that he uses illicit drugs (Cocaine). Independent for most of his  ADL.  Allergies  Allergen Reactions  . Haldol [Haloperidol]     afib problems    Family History  Problem Relation Age of Onset  . Hypertension Mother   . Colon cancer Neg Hx   . Lung cancer Mother     smoker  . Colon polyps Sister     twin sister    Prior to Admission medications   Medication Sig Start Date End Date Taking? Authorizing Provider  beclomethasone (QVAR) 80 MCG/ACT inhaler Inhale 2 puffs into the lungs 2 (two) times daily.    Yes Historical  Provider, MD  folic acid (FOLVITE) 1 MG tablet Take 1 tablet (1 mg total) by mouth daily. 12/02/13  Yes Angelica Chessman, MD  furosemide (LASIX) 40 MG tablet Take 1 tablet (40 mg total) by mouth 2 (two) times daily. 12/02/13  Yes Angelica Chessman, MD  lactulose (CHRONULAC) 10 GM/15ML solution Take 30 mLs (20 g total) by mouth 3 (three) times daily. 12/06/13  Yes Angelica Chessman, MD  Nutritional Supplements (ENSURE HIGH PROTEIN) LIQD Take 20 mls twice daily  12/06/13  Yes Angelica Chessman, MD  pantoprazole (PROTONIX) 40 MG tablet Take 40 mg by mouth 2 (two) times daily.   Yes Historical Provider, MD  spironolactone (ALDACTONE) 100 MG tablet Take 2 tablets (200 mg total) by mouth daily. 12/10/13  Yes Milus Banister, MD  sulfamethoxazole-trimethoprim (BACTRIM DS) 800-160 MG per tablet Take 1 tablet by mouth 2 (two) times daily. 12/02/13  Yes Angelica Chessman, MD  thiamine (VITAMIN B-1) 100 MG tablet Take 1 tablet (100 mg total) by mouth daily. 12/02/13  Yes Angelica Chessman, MD    Physical Exam: Filed Vitals:   12/10/13 1749 12/10/13 1754 12/10/13 1951 12/10/13 2041  BP: 133/75 117/63    Pulse:  98    Temp:      TempSrc:      Resp:      Height:   5' 8.5" (1.74 m)   Weight:   116.6 kg (257 lb 0.9 oz)   SpO2:  100%  100%    General: Alert, Awake and Oriented to Time, Place and Person. Appear in moderate distress Eyes: PERRL ENT: Oral Mucosa clear moist. Neck: mild JVD Cardiovascular: S1 and S2 Present, no Murmur, Peripheral Pulses Present Respiratory: Bilateral Air entry equal and Decreased, bilateral Crackles, bilateral wheezes Abdomen: Bowel Sound Present, Soft and Non tender Skin: No Rash Extremities: Bilateral Pedal edema, with redness and warmth no calf tenderness Neurologic: Grossly Unremarkable.  Labs on Admission:  CBC:  Recent Labs Lab 12/10/13 1630  WBC 6.3  NEUTROABS 3.8  HGB 9.8*  HCT 28.5*  MCV 91.6  PLT 97*    CMP     Component Value Date/Time   NA 130* 12/10/2013 1630   K 4.0 12/10/2013 1630   CL 94* 12/10/2013 1630   CO2 25 12/10/2013 1630   GLUCOSE 111* 12/10/2013 1630   BUN 19 12/10/2013 1630   CREATININE 0.99 12/10/2013 1630   CALCIUM 8.3* 12/10/2013 1630   PROT 6.7 12/10/2013 1630   ALBUMIN 2.3* 12/10/2013 1630   AST 77* 12/10/2013 1630   ALT 44 12/10/2013 1630   ALKPHOS 141* 12/10/2013 1630   BILITOT 1.1 12/10/2013 1630   GFRNONAA >90 12/10/2013 1630   GFRAA >90 12/10/2013 1630    No results found for this  basename: LIPASE, AMYLASE,  in the last 168 hours  Recent Labs Lab 12/10/13 1930  AMMONIA 42     Recent Labs Lab 12/10/13 1630 12/10/13 2005  TROPONINI <0.30 <0.30   BNP (last 3 results)  Recent Labs  12/10/13 1630  PROBNP 282.6*    Radiological Exams on Admission: Dg Chest 2 View  12/10/2013   CLINICAL DATA:  Shortness of breath.  EXAM: CHEST  2 VIEW  COMPARISON:  November 12, 2013.  FINDINGS: The heart size and mediastinal contours are within normal limits. Left lung is clear. Mild right basilar opacity is noted concerning for pneumonia or atelectasis. Faint linear right upper lobe opacity is noted concerning for subsegmental atelectasis or possibly pneumonia. The visualized skeletal structures are unremarkable.  IMPRESSION: Right lower and upper lobe opacities are noted concerning for pneumonia or atelectasis. Followup radiographs are recommended.   Electronically Signed   By: Sabino Dick M.D.   On: 12/10/2013 16:45   Ct Abdomen Pelvis W Contrast  12/10/2013   CLINICAL DATA:  Abdomen pain and distention  EXAM: CT ABDOMEN AND PELVIS WITH CONTRAST  TECHNIQUE: Multidetector CT imaging of the abdomen and pelvis was performed using the standard protocol following bolus administration of intravenous contrast.  CONTRAST:  119mL OMNIPAQUE IOHEXOL 300 MG/ML  SOLN  COMPARISON:  November 09, 2013  FINDINGS: There is interval developed marked ascites in the abdomen and pelvis. There is nodular contour of the liver consistent with known cirrhosis of liver. Gallstones are noted in the gallbladder. Spleen is stable. The pancreas adrenal glands and kidneys are normal. There is no hydronephrosis bilaterally. There is mild atherosclerosis of the abdominal aorta without aneurysmal dilatation. There is no small bowel obstruction or diverticulitis. Fluid and stranding is identified within the subcutaneous fat of the abdomen and pelvis. There is moderate right pleural effusion with compression atelectasis of  adjacent posterior right lung base. The left lung is clear. Degenerative joint changes of the spine are noted.  IMPRESSION: Interval developed marked ascites in the abdomen and pelvis. Cirrhosis of liver. Cholelithiasis.   Electronically Signed   By: Abelardo Diesel M.D.   On: 12/10/2013 19:04    EKG: Independently reviewed. sinus tachycardia.  Assessment/Plan Principal Problem:   Acute CHF Active Problems:   Smoking   Cirrhosis with alcoholism   Mild Coagulopathy   Acute respiratory failure   Esophageal varices with bleeding   Hepatitis C   Cellulitis   HCAP (healthcare-associated pneumonia)   Ascites   1. ascites The patient is presenting because of acute shortness of breath that has been ongoing since last one week along with that he has symptoms of bilateral leg swelling, volume overload, abdominal distention. Although he mentions he is compliant with his medication At present I will give him one dose of IV Lasix and from tomorrow I would continue him on increasing dose of oral Lasix from 40 mg twice a day to 80 mg twice a day, I would also continue him on Aldactone 200 mg total dose. Ultrasound-guided paracentesis for both diagnoses and therapeutic purpose is ordered. Patient will be kept n.p.o. and on also her diet from tomorrow. I would also check his urine sodium and urine creatinine for his hyponatremia although it is likely secondary to significant volume overload due to ascites.  2. Cirrhosis of liver and hepatic encephalopathy Continue lactulose check INR  3. Healthcare associated pneumonia Patient denies any episodes of aspiration and he has evidence of right-sided focal infiltrate suggesting pneumonia At present I would (cultures and continue him on IV cefepime and vancomycin, which will also cover cellulitis. Urine antigens are also ordered a sputum culture  4. Chronic alcohol abuse Monitoring on telemetry and also on CIWA protocol  5. active smoker Smoking  cessation counseling was done Nicotine patch at present  6. possible acute CHF Although his symptoms can also be explained from ascites and liver cirrhosis. I would check an echocardiogram. His 2 sets of troponin is negative. Monitor on telemetry.  7. Thrombocytopenia and elevated INR   likely due to hepatic cirrhosis. He also has history of esophageal varices, At present holding pharmacological prophylaxis.  8. history of esophageal varices Protonix twice a day  DVT Prophylaxis: Mechanical compression  Nutrition: N.p.o. from midnight, low-salt diet  Code Status: Full   Family Communication: Family  was present at bedside, opportunity was given to ask question and all questions were answered satisfactorily at the time of interview. Disposition: Admitted to inpatient in telemetry unit.  Author: Berle Mull, MD [facility] Pager: [phone] 12/10/2013, 8:55 PM    If 7PM-7AM, please contact night-coverage [email or web address] Password [email or web address]

## 2013-12-10 NOTE — ED Notes (Signed)
Pt presents to ED at the front door from Dr Ardis Hughs due to sob, massive ascites and edema.  Pt was removed from car and take to room 24. Pt sats are 98% with O2. Respirations at 35. Pt has audible wheeze.  Pt in bed and calm at this time.

## 2013-12-10 NOTE — Progress Notes (Signed)
   CARE MANAGEMENT ED NOTE 12/10/2013  Patient:  Oscar Swanson, Oscar Swanson   Account Number:  [id number]  Date Initiated:  12/10/2013  Documentation initiated by:  Livia Snellen  Subjective/Objective Assessment:   Patient presents to Ed with shortness of breath, ascities and +2 pitting edema to bilateral lower extremities.     Subjective/Objective Assessment Detail:   Patient with past medical history of DM, GI bleed, Cirrhosis, alcohol abuse.     Action/Plan:   Patient given IV lasix in ED, CT of abdomen and chest xray completed in the ED.   Action/Plan Detail:   Anticipated DC Date:       Status Recommendation to Physician:   Result of Recommendation:    Other ED Services  Consult Working [place]  CM consult  Other    Choice offered to / List presented to:  C-1 Patient          Status of service:  Completed, signed off  ED Comments:   ED Comments Detail:  EDCM spoke to patient and patient's sister at bedside. Patient reports that he lives alone.  Patient has a walker and a cane at home.  Patient has two sisters who check in on him, take him to his doctor's appointments wash his clothes etc.  Patient confirms he has home health care with [facility], a visiting RN and PT as per patient.  EDCM placed text to [facility] transition specialist to make her aware of patient's admission.  Patient reports that he has ordered equipment from [facility], "A long time ago but hasn't gotten yet."  Patient reports orders for a commode, wheelchair and hospital bed were sent to [facility] from his pcp at [facility].  EDCM received permission to call [facility] to check on status of equipment.  EDCM called [facility], but office closed at seven and after hours for emergencies. EDCM explained to patient that unit CM will follow up with equipment.  Patient's sister also inquired about a nursing aide at home.  EDCM provided patient with a list of private duty nursing services.  Patient wearing  oxygen in the ED, patient does NOT wear oxygen at home.  Patient and family thankful for services.  No further EDCM needs at this time.

## 2013-12-10 NOTE — Progress Notes (Addendum)
ANTIBIOTIC CONSULT NOTE - INITIAL  Pharmacy Consult for Cefepime/Vancomycin Indication: HCAP  Allergies  Allergen Reactions  . Haldol [Haloperidol]     afib problems    Patient Measurements: Height: 5' 8.5" (174 cm) Weight: 257 lb 0.9 oz (116.6 kg) IBW/kg (Calculated) : 69.56  Vital Signs: Temp: 97.3 F (36.3 C) (02/10 1559) Temp src: Oral (02/10 1559) BP: 117/63 mmHg (02/10 1754) Pulse Rate: 98 (02/10 1754) Intake/Output from previous day:   Intake/Output from this shift:    Labs:  Recent Labs  12/10/13 1630  WBC 6.3  HGB 9.8*  PLT 97*  CREATININE 0.99   Estimated Creatinine Clearance: 109.1 ml/min (by C-G formula based on Cr of 0.99). No results found for this basename: VANCOTROUGH, VANCOPEAK, VANCORANDOM, GENTTROUGH, GENTPEAK, GENTRANDOM, TOBRATROUGH, TOBRAPEAK, TOBRARND, AMIKACINPEAK, AMIKACINTROU, AMIKACIN,  in the last 72 hours   Microbiology: No results found for this or any previous visit (from the past 720 hour(s)).  Medical History: Past Medical History  Diagnosis Date  . Diabetes mellitus without complication   . Schizophrenia     onset age 86.  in and out of psych facilities from age 75 to age 67.  s/p electroconvulsive therapy.  . TB (tuberculosis)     sister says this dx'd at age 77 and treated  . Hepatic encephalopathy   . Esophageal varices   . GI bleed   . Cirrhosis   . Thrombocytopenia   . Alcohol abuse     Medications:  Scheduled:  . furosemide  20 mg Intravenous Once  . [START ON 12/11/2013] pneumococcal 23 valent vaccine  0.5 mL Intramuscular Tomorrow-1000   Infusions:  . azithromycin (ZITHROMAX) 500 MG IVPB    . ceFEPime (MAXIPIME) IV     Assessment:  53 yr male presents with shortness of breath, fluid overload and abdominal discomfort.  Recent hospitalization (1/8-1/16) for hepatic encephalopathy.  Chest Xray = upper/lower opacities concerning for pna or atelectasis  Goal of Therapy:  Eradication of suspected  infection Vancomycin troughl = 15-20 mcg/ml  Plan:  Cefepime 2gm IV x 1 in ED followed by 1gm IV q8h Vancomycin 2500mg  IV x 1 followed by 1gm IV q12h  Zoya Sprecher, Toribio Harbour, PharmD 12/10/2013,7:59 PM

## 2013-12-10 NOTE — Patient Instructions (Addendum)
You need to proceed directly to the emergency room for SOB, massive ascites and edema.  You will probably require admission to the hospital for labs, fluid management.

## 2013-12-11 ENCOUNTER — Inpatient Hospital Stay (HOSPITAL_COMMUNITY): Payer: Medicaid Other

## 2013-12-11 DIAGNOSIS — I517 Cardiomegaly: Secondary | ICD-10-CM

## 2013-12-11 LAB — CBC
HEMATOCRIT: 28.1 % — AB (ref 39.0–52.0)
Hemoglobin: 9.1 g/dL — ABNORMAL LOW (ref 13.0–17.0)
MCH: 30.3 pg (ref 26.0–34.0)
MCHC: 32.4 g/dL (ref 30.0–36.0)
MCV: 93.7 fL (ref 78.0–100.0)
Platelets: 86 10*3/uL — ABNORMAL LOW (ref 150–400)
RBC: 3 MIL/uL — AB (ref 4.22–5.81)
RDW: 16.1 % — ABNORMAL HIGH (ref 11.5–15.5)
WBC: 5.3 10*3/uL (ref 4.0–10.5)

## 2013-12-11 LAB — BODY FLUID CELL COUNT WITH DIFFERENTIAL
EOS FL: 0 %
LYMPHS FL: 82 %
Monocyte-Macrophage-Serous Fluid: 16 % — ABNORMAL LOW (ref 50–90)
NEUTROPHIL FLUID: 2 % (ref 0–25)
WBC FLUID: 27 uL (ref 0–1000)

## 2013-12-11 LAB — COMPREHENSIVE METABOLIC PANEL
ALBUMIN: 2.2 g/dL — AB (ref 3.5–5.2)
ALT: 41 U/L (ref 0–53)
AST: 71 U/L — ABNORMAL HIGH (ref 0–37)
Alkaline Phosphatase: 119 U/L — ABNORMAL HIGH (ref 39–117)
BUN: 19 mg/dL (ref 6–23)
CO2: 26 mEq/L (ref 19–32)
CREATININE: 1.01 mg/dL (ref 0.50–1.35)
Calcium: 8.1 mg/dL — ABNORMAL LOW (ref 8.4–10.5)
Chloride: 95 mEq/L — ABNORMAL LOW (ref 96–112)
GFR calc Af Amer: >90 mL/min (ref >90)
GFR calc non Af Amer: 84 mL/min — ABNORMAL LOW (ref >90)
Glucose, Bld: 93 mg/dL (ref 70–99)
POTASSIUM: 4.2 meq/L (ref 3.7–5.3)
Sodium: 129 mEq/L — ABNORMAL LOW (ref 137–147)
TOTAL PROTEIN: 6.1 g/dL (ref 6.0–8.3)
Total Bilirubin: 1.1 mg/dL (ref 0.3–1.2)

## 2013-12-11 LAB — GLUCOSE, SEROUS FLUID: Glucose, Fluid: 102 mg/dL

## 2013-12-11 LAB — UREA NITROGEN, URINE: UREA NITROGEN UR: 1456 mg/dL

## 2013-12-11 LAB — PROTEIN, BODY FLUID: Total protein, fluid: 0.2 g/dL

## 2013-12-11 LAB — SODIUM, URINE, RANDOM: Sodium, Ur: <15 mEq/L

## 2013-12-11 LAB — ALBUMIN, FLUID (OTHER): ALBUMIN FL: 0.2 g/dL

## 2013-12-11 LAB — LACTATE DEHYDROGENASE, PLEURAL OR PERITONEAL FLUID: LD, Fluid: 25 U/L — ABNORMAL HIGH (ref 3–23)

## 2013-12-11 LAB — CREATININE, URINE, RANDOM: CREATININE, URINE: 253 mg/dL

## 2013-12-11 LAB — PROTIME-INR
INR: 1.86 — AB (ref 0.00–1.49)
Prothrombin Time: 20.9 seconds — ABNORMAL HIGH (ref 11.6–15.2)

## 2013-12-11 MED ORDER — ALBUTEROL SULFATE (2.5 MG/3ML) 0.083% IN NEBU
5.0000 mg | INHALATION_SOLUTION | Freq: Once | RESPIRATORY_TRACT | Status: AC
  Administered 2013-12-11: 5 mg via RESPIRATORY_TRACT
  Filled 2013-12-11: qty 6

## 2013-12-11 MED ORDER — IPRATROPIUM BROMIDE 0.02 % IN SOLN
0.5000 mg | Freq: Once | RESPIRATORY_TRACT | Status: AC
  Administered 2013-12-11: 0.5 mg via RESPIRATORY_TRACT
  Filled 2013-12-11: qty 2.5

## 2013-12-11 MED ORDER — ENSURE COMPLETE PO LIQD
237.0000 mL | Freq: Three times a day (TID) | ORAL | Status: DC
  Administered 2013-12-11 – 2013-12-30 (×46): 237 mL via ORAL

## 2013-12-11 MED ORDER — FUROSEMIDE 10 MG/ML IJ SOLN
80.0000 mg | Freq: Two times a day (BID) | INTRAMUSCULAR | Status: DC
  Administered 2013-12-11 – 2013-12-14 (×6): 80 mg via INTRAVENOUS
  Filled 2013-12-11 (×8): qty 8

## 2013-12-11 NOTE — Progress Notes (Signed)
  Echocardiogram 2D Echocardiogram has been performed.  Rocquel Askren 12/11/2013, 1:40 PM

## 2013-12-11 NOTE — Procedures (Signed)
Successful US guided paracentesis from LLQ.  Yielded 6L of cloudy yellow fluid.  No immediate complications.  Pt tolerated well.   Specimen was sent for labs.  Ascencion Dike PA-C 12/11/2013 11:24 AM

## 2013-12-11 NOTE — Progress Notes (Signed)
[facility] PROGRESS NOTE  LOGANN FLAM [id number] DOB: 11-13-1960 DOA: 12/10/2013  PCP: Angelica Chessman, MD  Brief HPI: Oscar BLUMENSCHEIN is a 53 y.o. male with Past medical history of alcohol cirrhosis, hepatic encephalopathy, active smoker, esophageal varices, chronic thrombocytopenia. Patient presented with complaints of abdominal distention and shortness of breath. He was seen at GI clinic and was recommended for admission for tense ascites. He has bilateral leg swelling which worsened and was significantly red one week ago for which he was seen in the community clinic and was placed on oral Bactrim.   Past medical history:  Past Medical History  Diagnosis Date  . Diabetes mellitus without complication   . Schizophrenia     onset age 32.  in and out of psych facilities from age 47 to age 10.  s/p electroconvulsive therapy.  . TB (tuberculosis)     sister says this dx'd at age 58 and treated  . Hepatic encephalopathy   . Esophageal varices   . GI bleed   . Cirrhosis   . Thrombocytopenia   . Alcohol abuse     Consultants: None  Procedures: US guided Paracentesis 2/11  Antibiotics: IV Vanc 2/10--> IV Cefepime 2/10-->  Subjective: Patient feels better after undergoing paracentesis. Still with significant swelling in legs, scrotum, thighs. No pain.  Objective: Vital Signs  Filed Vitals:   12/11/13 1040 12/11/13 1044 12/11/13 1051 12/11/13 1105  BP: 103/60 110/54 111/54 112/60  Pulse:      Temp:      TempSrc:      Resp:      Height:      Weight:      SpO2:        Intake/Output Summary (Last 24 hours) at 12/11/13 1236 Last data filed at 12/10/13 2111  Gross per 24 hour  Intake      0 ml  Output    200 ml  Net   -200 ml   Filed Weights   12/10/13 1951 12/11/13 0605  Weight: 116.6 kg (257 lb 0.9 oz) 122.6 kg (270 lb 4.5 oz)   General appearance: alert, cooperative, appears stated age and no distress Head: Normocephalic, without obvious  abnormality, atraumatic Resp: decreased air entry at bases without definite crackles. No wheezing. Cardio: regular rate and rhythm, S1, S2 normal, no murmur, click, rub or gallop GI: Soft, distended, NT. BS present. No masses or organomegaly. Male genitalia: edematous scrotum bilaterally. Extremities: edema 2+ bil LE Skin: old shallow ulcers seen bil LE Neurologic: No focal deficits.  Lab Results:  Basic Metabolic Panel:  Recent Labs Lab 12/10/13 1630 12/11/13 0535  NA 130* 129*  K 4.0 4.2  CL 94* 95*  CO2 25 26  GLUCOSE 111* 93  BUN 19 19  CREATININE 0.99 1.01  CALCIUM 8.3* 8.1*   Liver Function Tests:  Recent Labs Lab 12/10/13 1630 12/11/13 0535  AST 77* 71*  ALT 44 41  ALKPHOS 141* 119*  BILITOT 1.1 1.1  PROT 6.7 6.1  ALBUMIN 2.3* 2.2*    Recent Labs Lab 12/10/13 1930  AMMONIA 42   CBC:  Recent Labs Lab 12/10/13 1630 12/11/13 0535  WBC 6.3 5.3  NEUTROABS 3.8  --   HGB 9.8* 9.1*  HCT 28.5* 28.1*  MCV 91.6 93.7  PLT 97* 86*   Cardiac Enzymes:  Recent Labs Lab 12/10/13 1630 12/10/13 2005  TROPONINI <0.30 <0.30   BNP (last 3 results)  Recent Labs  12/10/13 1630  PROBNP 282.6*  Studies/Results: Dg Chest 2 View  12/10/2013   CLINICAL DATA:  Shortness of breath.  EXAM: CHEST  2 VIEW  COMPARISON:  November 12, 2013.  FINDINGS: The heart size and mediastinal contours are within normal limits. Left lung is clear. Mild right basilar opacity is noted concerning for pneumonia or atelectasis. Faint linear right upper lobe opacity is noted concerning for subsegmental atelectasis or possibly pneumonia. The visualized skeletal structures are unremarkable.  IMPRESSION: Right lower and upper lobe opacities are noted concerning for pneumonia or atelectasis. Followup radiographs are recommended.   Electronically Signed   By: Sabino Dick M.D.   On: 12/10/2013 16:45   Ct Abdomen Pelvis W Contrast  12/10/2013   CLINICAL DATA:  Abdomen pain and distention   EXAM: CT ABDOMEN AND PELVIS WITH CONTRAST  TECHNIQUE: Multidetector CT imaging of the abdomen and pelvis was performed using the standard protocol following bolus administration of intravenous contrast.  CONTRAST:  159mL OMNIPAQUE IOHEXOL 300 MG/ML  SOLN  COMPARISON:  November 09, 2013  FINDINGS: There is interval developed marked ascites in the abdomen and pelvis. There is nodular contour of the liver consistent with known cirrhosis of liver. Gallstones are noted in the gallbladder. Spleen is stable. The pancreas adrenal glands and kidneys are normal. There is no hydronephrosis bilaterally. There is mild atherosclerosis of the abdominal aorta without aneurysmal dilatation. There is no small bowel obstruction or diverticulitis. Fluid and stranding is identified within the subcutaneous fat of the abdomen and pelvis. There is moderate right pleural effusion with compression atelectasis of adjacent posterior right lung base. The left lung is clear. Degenerative joint changes of the spine are noted.  IMPRESSION: Interval developed marked ascites in the abdomen and pelvis. Cirrhosis of liver. Cholelithiasis.   Electronically Signed   By: Abelardo Diesel M.D.   On: 12/10/2013 19:04    Medications:  Scheduled: . ceFEPime (MAXIPIME) IV  1 g Intravenous Q8H  . feeding supplement (ENSURE COMPLETE)  237 mL Oral TID WC  . folic acid  1 mg Oral Daily  . furosemide  40 mg Intravenous Once  . furosemide  80 mg Intravenous Q12H  . lactulose  20 g Oral TID  . multivitamin with minerals  1 tablet Oral Daily  . nicotine  14 mg Transdermal Daily  . pantoprazole  40 mg Oral BID  . pneumococcal 23 valent vaccine  0.5 mL Intramuscular Tomorrow-1000  . sodium chloride  3 mL Intravenous Q12H  . spironolactone  200 mg Oral Daily  . thiamine  100 mg Oral Daily   Or  . thiamine  100 mg Intravenous Daily  . vancomycin  1,000 mg Intravenous Q12H   Continuous:  ZOX:[id number], LORazepam, ondansetron (ZOFRAN) IV,  ondansetron  Assessment/Plan:  Principal Problem:   Ascites Active Problems:   Smoking   Cirrhosis with alcoholism   Mild Coagulopathy   Acute respiratory failure   Esophageal varices with bleeding   Hepatitis C   Cellulitis   Acute CHF   HCAP (healthcare-associated pneumonia)    Ascites  He is now S/p paracentesis. He is feeling better. Continue diuretics.   Decompensated Cirrhosis of liver and hepatic encephalopathy  Continue lactulose. INR is elevated. OP follow up with hepatology. Increase diuretics and give IV for now. ECHO is pending to rule out cardiac reasons though unlikely. Strict I/O. Daily weights.  Possible Healthcare associated pneumonia  Patient denies any episodes suggestive of aspiration. CXR suggested infiltrate. Patient does not have a cough per se. Will continue Abx  for now.   Chronic alcohol abuse  Monitoring on telemetry and also on CIWA protocol. Patient counseled to stop alcohol consumption.  Active smoker  Smoking cessation counseling was done  Nicotine patch at present   Thrombocytopenia and elevated INR  Due to hepatic cirrhosis. He also has history of esophageal varices. Stable.  History of esophageal varices  He is status post banding in January. Protonix twice a day   Code Status: Full Code. Will need to readdressed in near future.  DVT Prophylaxis: SCD's    Family Communication: Discussed with patient  Disposition Plan: Not ready for discharge. Will need IV diuresis for 1-2 more days.    LOS: 1 day   [facility] Pager [phone] 12/11/2013, 12:36 PM  If 8PM-8AM, please contact night-coverage at [email or web address], password [facility]

## 2013-12-12 LAB — BASIC METABOLIC PANEL
BUN: 20 mg/dL (ref 6–23)
CHLORIDE: 94 meq/L — AB (ref 96–112)
CO2: 27 meq/L (ref 19–32)
Calcium: 8.3 mg/dL — ABNORMAL LOW (ref 8.4–10.5)
Creatinine, Ser: 1.02 mg/dL (ref 0.50–1.35)
GFR calc non Af Amer: 83 mL/min — ABNORMAL LOW (ref >90)
Glucose, Bld: 102 mg/dL — ABNORMAL HIGH (ref 70–99)
Potassium: 4 mEq/L (ref 3.7–5.3)
Sodium: 128 mEq/L — ABNORMAL LOW (ref 137–147)

## 2013-12-12 LAB — CBC
HCT: 29.1 % — ABNORMAL LOW (ref 39.0–52.0)
Hemoglobin: 9.5 g/dL — ABNORMAL LOW (ref 13.0–17.0)
MCH: 30.5 pg (ref 26.0–34.0)
MCHC: 32.6 g/dL (ref 30.0–36.0)
MCV: 93.6 fL (ref 78.0–100.0)
Platelets: 87 10*3/uL — ABNORMAL LOW (ref 150–400)
RBC: 3.11 MIL/uL — ABNORMAL LOW (ref 4.22–5.81)
RDW: 16.2 % — AB (ref 11.5–15.5)
WBC: 5.5 10*3/uL (ref 4.0–10.5)

## 2013-12-12 MED ORDER — ALUM & MAG HYDROXIDE-SIMETH 200-200-20 MG/5ML PO SUSP
15.0000 mL | Freq: Four times a day (QID) | ORAL | Status: DC | PRN
  Administered 2013-12-12 – 2013-12-16 (×4): 15 mL via ORAL
  Filled 2013-12-12 (×4): qty 30

## 2013-12-12 MED ORDER — LEVOFLOXACIN 500 MG PO TABS
500.0000 mg | ORAL_TABLET | Freq: Every day | ORAL | Status: DC
  Administered 2013-12-12 – 2013-12-19 (×8): 500 mg via ORAL
  Filled 2013-12-12 (×8): qty 1

## 2013-12-12 NOTE — Progress Notes (Signed)
OT Cancellation Note  Patient Details Name: Oscar Swanson MRN: [id number] DOB: 08-08-1961   Cancelled Treatment:    Pt declined OT at this time stating he is short of breath. Will check back as schedule allows. Betsy Pries 12/12/2013, 11:36 AM

## 2013-12-12 NOTE — Progress Notes (Signed)
[facility] PROGRESS NOTE  Oscar Swanson [id number] DOB: 04-03-1961 DOA: 12/10/2013  PCP: Angelica Chessman, MD  Brief HPI: Oscar Swanson is a 53 y.o. male with Past medical history of alcohol cirrhosis, hepatic encephalopathy, active smoker, esophageal varices, chronic thrombocytopenia. Patient presented with complaints of abdominal distention and shortness of breath. He was seen at GI clinic and was recommended for admission for tense ascites. He has bilateral leg swelling which worsened and was significantly red one week ago for which he was seen in the community clinic and was placed on oral Bactrim.   Past medical history:  Past Medical History  Diagnosis Date  . Diabetes mellitus without complication   . Schizophrenia     onset age 45.  in and out of psych facilities from age 44 to age 68.  s/p electroconvulsive therapy.  . TB (tuberculosis)     sister says this dx'd at age 53 and treated  . Hepatic encephalopathy   . Esophageal varices   . GI bleed   . Cirrhosis   . Thrombocytopenia   . Alcohol abuse     Consultants: None  Procedures: US guided Paracentesis 2/11  Antibiotics: IV Vanc 2/10-->2/12 IV Cefepime 2/10-->2/12  Subjective: Patient feels same. States the paracentesis site was oozing all night. Also concerned about availability of his apartment.   Objective: Vital Signs  Filed Vitals:   12/11/13 1828 12/11/13 2210 12/12/13 0537 12/12/13 0859  BP: 109/54 124/67 114/72 128/77  Pulse: 97 108 102 99  Temp: 98 F (36.7 C) 98.5 F (36.9 C) 98.3 F (36.8 C) 98.3 F (36.8 C)  TempSrc: Oral Oral Oral Oral  Resp: 20 18 18 20   Height:      Weight:      SpO2: 98% 98% 98% 99%    Intake/Output Summary (Last 24 hours) at 12/12/13 1058 Last data filed at 12/12/13 1044  Gross per 24 hour  Intake      0 ml  Output   4200 ml  Net  -4200 ml   Filed Weights   12/10/13 1951 12/11/13 0605 12/11/13 1313  Weight: 116.6 kg (257 lb 0.9 oz) 122.6  kg (270 lb 4.5 oz) 117.5 kg (259 lb 0.7 oz)   General appearance: alert, cooperative, appears stated age and no distress Head: Normocephalic, without obvious abnormality, atraumatic Resp: decreased air entry at bases without definite crackles. No wheezing. Cardio: regular rate and rhythm, S1, S2 normal, no murmur, click, rub or gallop GI: Soft, distended with ascites, NT. BS present. No masses or organomegaly. Male genitalia: edematous scrotum bilaterally. Extremities: edema 2+ bil LE Skin: old shallow ulcers seen bil LE covered with dressing Neurologic: No focal deficits.  Lab Results:  Basic Metabolic Panel:  Recent Labs Lab 12/10/13 1630 12/11/13 0535 12/12/13 0535  NA 130* 129* 128*  K 4.0 4.2 4.0  CL 94* 95* 94*  CO2 25 26 27   GLUCOSE 111* 93 102*  BUN 19 19 20   CREATININE 0.99 1.01 1.02  CALCIUM 8.3* 8.1* 8.3*   Liver Function Tests:  Recent Labs Lab 12/10/13 1630 12/11/13 0535  AST 77* 71*  ALT 44 41  ALKPHOS 141* 119*  BILITOT 1.1 1.1  PROT 6.7 6.1  ALBUMIN 2.3* 2.2*    Recent Labs Lab 12/10/13 1930  AMMONIA 42   CBC:  Recent Labs Lab 12/10/13 1630 12/11/13 0535 12/12/13 0535  WBC 6.3 5.3 5.5  NEUTROABS 3.8  --   --   HGB 9.8* 9.1* 9.5*  HCT  28.5* 28.1* 29.1*  MCV 91.6 93.7 93.6  PLT 97* 86* 87*   Cardiac Enzymes:  Recent Labs Lab 12/10/13 1630 12/10/13 2005  TROPONINI <0.30 <0.30   BNP (last 3 results)  Recent Labs  12/10/13 1630  PROBNP 282.6*    Studies/Results: Dg Chest 2 View  12/10/2013   CLINICAL DATA:  Shortness of breath.  EXAM: CHEST  2 VIEW  COMPARISON:  November 12, 2013.  FINDINGS: The heart size and mediastinal contours are within normal limits. Left lung is clear. Mild right basilar opacity is noted concerning for pneumonia or atelectasis. Faint linear right upper lobe opacity is noted concerning for subsegmental atelectasis or possibly pneumonia. The visualized skeletal structures are unremarkable.  IMPRESSION:  Right lower and upper lobe opacities are noted concerning for pneumonia or atelectasis. Followup radiographs are recommended.   Electronically Signed   By: Sabino Dick M.D.   On: 12/10/2013 16:45   Ct Abdomen Pelvis W Contrast  12/10/2013   CLINICAL DATA:  Abdomen pain and distention  EXAM: CT ABDOMEN AND PELVIS WITH CONTRAST  TECHNIQUE: Multidetector CT imaging of the abdomen and pelvis was performed using the standard protocol following bolus administration of intravenous contrast.  CONTRAST:  145mL OMNIPAQUE IOHEXOL 300 MG/ML  SOLN  COMPARISON:  November 09, 2013  FINDINGS: There is interval developed marked ascites in the abdomen and pelvis. There is nodular contour of the liver consistent with known cirrhosis of liver. Gallstones are noted in the gallbladder. Spleen is stable. The pancreas adrenal glands and kidneys are normal. There is no hydronephrosis bilaterally. There is mild atherosclerosis of the abdominal aorta without aneurysmal dilatation. There is no small bowel obstruction or diverticulitis. Fluid and stranding is identified within the subcutaneous fat of the abdomen and pelvis. There is moderate right pleural effusion with compression atelectasis of adjacent posterior right lung base. The left lung is clear. Degenerative joint changes of the spine are noted.  IMPRESSION: Interval developed marked ascites in the abdomen and pelvis. Cirrhosis of liver. Cholelithiasis.   Electronically Signed   By: Abelardo Diesel M.D.   On: 12/10/2013 19:04   US Paracentesis  12/11/2013   CLINICAL DATA:  Cirrhosis, abdominal distention secondary to ascites. Request diagnostic and therapeutic paracentesis.  EXAM: ULTRASOUND GUIDED PARACENTESIS  COMPARISON:  None.  PROCEDURE: An ultrasound guided paracentesis was thoroughly discussed with the patient and questions answered. The benefits, risks, alternatives and complications were also discussed. The patient understands and wishes to proceed with the procedure.  Written consent was obtained.  Ultrasound was performed to localize and mark an adequate pocket of fluid in the left lower quadrant of the abdomen. The area was then prepped and draped in the normal sterile fashion. 1% Lidocaine was used for local anesthesia. Under ultrasound guidance a 19 gauge Yueh catheter was introduced. Paracentesis was performed. The catheter was removed and a dressing applied.  COMPLICATIONS: None immediate  FINDINGS: A total of approximately . 6 L. of cloudy yellow fluid was removed. A fluid sample was sent for laboratory analysis.  IMPRESSION: Successful ultrasound guided paracentesis yielding 6 L of ascites.  Read by: Ascencion Dike PA-C   Electronically Signed   By: Aletta Edouard M.D.   On: 12/11/2013 11:26    Medications:  Scheduled: . feeding supplement (ENSURE COMPLETE)  237 mL Oral TID WC  . folic acid  1 mg Oral Daily  . furosemide  40 mg Intravenous Once  . furosemide  80 mg Intravenous BID  . lactulose  20  g Oral TID  . levofloxacin  500 mg Oral Daily  . multivitamin with minerals  1 tablet Oral Daily  . nicotine  14 mg Transdermal Daily  . pantoprazole  40 mg Oral BID  . sodium chloride  3 mL Intravenous Q12H  . spironolactone  200 mg Oral Daily  . thiamine  100 mg Oral Daily   Continuous:  JJH:ERDEYCXKG, LORazepam, ondansetron (ZOFRAN) IV, ondansetron  Assessment/Plan:  Principal Problem:   Ascites Active Problems:   Smoking   Cirrhosis with alcoholism   Mild Coagulopathy   Acute respiratory failure   Esophageal varices with bleeding   Hepatitis C   Cellulitis   Acute CHF   HCAP (healthcare-associated pneumonia)    Ascites  He is now S/p paracentesis on 2/11. He is feeling better. Continue diuretics. Still quite distended and oozing noted from the site. Discussed with IR. He will benefit from repeat paracentesis to remove more fluid. Will get that done 2/13.  Decompensated Cirrhosis of liver and hepatic encephalopathy and Hyponatremia    Continue lactulose. INR is elevated. OP follow up with hepatology.Continue IV diuretics. He is diuresing well. ECHO done and reveals normal systolic function. Cardiac etiology is unlikely. Strict I/O. Daily weights. Monitor lytes.  Possible Healthcare associated pneumonia  Patient denies any episodes suggestive of aspiration. CXR suggested infiltrate. Patient does not have a cough per se. No blood cultures obtained. Will change to oral Levaquin as he remains afebrile.   Chronic alcohol abuse  Monitoring on telemetry and also on CIWA protocol. Patient counseled to stop alcohol consumption.  Active smoker  Smoking cessation counseling was done. Nicotine patch at present   Thrombocytopenia and elevated INR  Due to hepatic cirrhosis. He also has history of esophageal varices. Stable.  History of esophageal varices  He is status post banding in January. Protonix twice a day   Code Status: Full Code. Will need to readdressed in near future.  DVT Prophylaxis: SCD's    Family Communication: Discussed with patient  Disposition Plan: Not ready for discharge. Will need IV diuresis for 1-2 more days. Repeat paracentesis in AM. SW consult to address his living situation. PT/OT.    LOS: 2 days   [facility] Pager [phone] 12/12/2013, 10:58 AM  If 8PM-8AM, please contact night-coverage at [email or web address], password [facility]

## 2013-12-12 NOTE — Progress Notes (Addendum)
Clinical Social Work Department CLINICAL SOCIAL WORK PLACEMENT NOTE 12/12/2013  Patient:  Oscar Swanson, Oscar Swanson  Account Number:  [id number] Admit date:  12/10/2013  Clinical Social Worker:  Sindy Messing, LCSW  Date/time:  12/12/2013 12:00 N  Clinical Social Work is seeking post-discharge placement for this patient at the following level of care:   [place]   (*CSW will update this form in Epic as items are completed)   12/12/2013  Patient/family provided with [place] Department of Clinical Social Work's list of facilities offering this level of care within the geographic area requested by the patient (or if unable, by the patient's family).  12/12/2013  Patient/family informed of their freedom to choose among providers that offer the needed level of care, that participate in Medicare, Medicaid or managed care program needed by the patient, have an available bed and are willing to accept the patient.  12/12/2013  Patient/family informed of [facility]' ownership interest in [facility], as well as of the fact that they are under no obligation to receive care at this facility.  PASARR submitted to EDS on 12/12/2013 PASARR number received from [employer]DS on 12/12/2013  FL2 transmitted to all facilities in geographic area requested by pt/family on  12/12/2013 FL2 transmitted to all facilities within larger geographic area on   Patient informed that his/her managed care company has contracts with or will negotiate with  certain facilities, including the following:     Patient/family informed of bed offers received:   Patient chooses bed at  Physician recommends and patient chooses bed at    Patient to be transferred to  on   Patient to be transferred to facility by   The following physician request were entered in Epic:   Additional Comments:

## 2013-12-12 NOTE — Progress Notes (Signed)
Clinical Social Work Department BRIEF PSYCHOSOCIAL ASSESSMENT 12/12/2013  Patient:  Oscar Swanson, Oscar Swanson     Account Number:  [id number]     Admit date:  12/10/2013  Clinical Social Worker:  Earlie Server  Date/Time:  12/12/2013 12:00 N  Referred by:  Physician  Date Referred:  12/12/2013 Referred for  SNF Placement   Other Referral:   Interview type:  Patient Other interview type:    PSYCHOSOCIAL DATA Living Status:  ALONE Admitted from facility:   Level of care:   Primary support name:  Oscar Swanson Primary support relationship to patient:  SIBLING Degree of support available:   Strong    CURRENT CONCERNS Current Concerns  Post-Acute Placement   Other Concerns:    SOCIAL WORK ASSESSMENT / PLAN CSW received referral from MD in order to assist with housing. CSW reviewed chart and met with patient at bedside. CSW introduced myself and explained role.    Patient reports he was recently evicted from apartment and has to find a new place to live by March 11th, 2015. CSW spoke with patient about current living arrangements and patient reports he lives alone and wants his own apartment. CSW spoke with patient about low-income housing or interactive resource center to assist. During assessment, patient's sister called into room. Patient agreeable for CSW, himself and sister to have conversation about DC planning.    Sister reports that patient is being evicted because he has destroyed the apartment. Sister reports that she spoke with patient re: going to a SNF last night and he was agreeable. Patient reports he does not want to go to SNF now because he does not want to use his disability check for SNF. Sister reports she is his payee and she knows SNF is needed. Sister reports that patient does not care for himself and does not take his medication. After talking with sister, patient is agreeable to SNF placement.    CSW completed FL2 and faxed out. CSW explained that since patient is Medicaid  only then it might have to be a facility outside of [facility] and patient and family understanding. CSW submitted for pasarr but had to complete a 30 day note. All clinicals submitted to [facility] for review. CSW will submit for [facility] authorization once pasarr number is received.    Patient was interested in PACE and reports he was going to sign up. CSW verified that patient does not have Medicare benefits and does not meet the 74 or older criteria for PACE program. CSW informed patient and sister of PACE information.    CSW attempted to discuss substance abuse with patient but patient reports his use is not an issue and does not want to complete SBIRT or discuss any SA resource options.   Assessment/plan status:  Psychosocial Support/Ongoing Assessment of Needs Other assessment/ plan:   Denied SBIRT   Information/referral to community resources:   SNF information    PATIENT'S/FAMILY'S RESPONSE TO PLAN OF CARE: Patient alert and oriented. Patient not agreeable to SNF because he wants his independence but understanding that sister is wanting placement. Patient agreeable to placement but is upset that he will not have his disability check. Patient guarded when discussing SA and does not want any treatment. Patient reports that sisters know what is best for him but feels they are overprotective at times. Patient agreeable for CSW follow up.       [place], [place] [phone]

## 2013-12-12 NOTE — Progress Notes (Signed)
Clinical Social Work  Patient has level E pasarr that was received. CSW was able to submit for [facility] authorization for SNF authorization. [facility] confirmation #: [id number].  Patient has no bed offers at this time. CSW will continue to follow to assist with placement.  [place], [place] [phone]

## 2013-12-12 NOTE — Progress Notes (Signed)
PT Cancellation Note  Patient Details Name: CAYMAN KIELBASA MRN: [id number] DOB: [birth year]   Cancelled Treatment:    Reason Eval/Treat Not Completed: Patient declined, no reason specified .Pt requested PT check back in am. Will check back tomorrow. Thanks.    Weston Anna, MPT Pager: [phone]

## 2013-12-12 NOTE — Progress Notes (Signed)
Mr Willert A&O X 3, vitals WNL, with O2 sat 98%on 3L, continue to experience difficultly breathing with activity, applied ABD pad to abdomen twice during the shift to prevent body fluid from getting on the sheets, pt denies pain ,

## 2013-12-13 ENCOUNTER — Inpatient Hospital Stay (HOSPITAL_COMMUNITY): Payer: Medicaid Other

## 2013-12-13 LAB — BASIC METABOLIC PANEL
BUN: 22 mg/dL (ref 6–23)
CALCIUM: 8.7 mg/dL (ref 8.4–10.5)
CO2: 26 mEq/L (ref 19–32)
Chloride: 96 mEq/L (ref 96–112)
Creatinine, Ser: 0.98 mg/dL (ref 0.50–1.35)
Glucose, Bld: 125 mg/dL — ABNORMAL HIGH (ref 70–99)
Potassium: 4.4 mEq/L (ref 3.7–5.3)
Sodium: 130 mEq/L — ABNORMAL LOW (ref 137–147)

## 2013-12-13 LAB — CBC
HCT: 31.7 % — ABNORMAL LOW (ref 39.0–52.0)
HEMOGLOBIN: 10.3 g/dL — AB (ref 13.0–17.0)
MCH: 30.7 pg (ref 26.0–34.0)
MCHC: 32.5 g/dL (ref 30.0–36.0)
MCV: 94.3 fL (ref 78.0–100.0)
Platelets: 91 10*3/uL — ABNORMAL LOW (ref 150–400)
RBC: 3.36 MIL/uL — ABNORMAL LOW (ref 4.22–5.81)
RDW: 16.1 % — AB (ref 11.5–15.5)
WBC: 7.9 10*3/uL (ref 4.0–10.5)

## 2013-12-13 NOTE — ED Provider Notes (Signed)
Medical screening examination/treatment/procedure(s) were performed by non-physician practitioner and as supervising physician I was immediately available for consultation/collaboration.      [place], DO 12/13/13 1458

## 2013-12-13 NOTE — Progress Notes (Addendum)
[facility] PROGRESS NOTE  Oscar Swanson BSJ:[id number] DOB: 1961/09/06 DOA: 12/10/2013  PCP: Angelica Chessman, MD  Brief HPI: Oscar Swanson is a 53 y.o. male with Past medical history of alcohol cirrhosis, hepatic encephalopathy, active smoker, esophageal varices, chronic thrombocytopenia. Patient presented with complaints of abdominal distention and shortness of breath. He was seen at GI clinic and was recommended for admission for tense ascites. He has bilateral leg swelling which worsened and was significantly red one week ago for which he was seen in the community clinic and was placed on oral Bactrim.   Past medical history:  Past Medical History  Diagnosis Date  . Diabetes mellitus without complication   . Schizophrenia     onset age 12.  in and out of psych facilities from age 79 to age 79.  s/p electroconvulsive therapy.  . TB (tuberculosis)     sister says this dx'd at age 50 and treated  . Hepatic encephalopathy   . Esophageal varices   . GI bleed   . Cirrhosis   . Thrombocytopenia   . Alcohol abuse     Consultants: None  Procedures: US guided Paracentesis 2/11  Antibiotics: IV Vanc 2/10-->2/12 IV Cefepime 2/10-->2/12 Levaquin 2/12-->  Subjective: Patient feels same. States the paracentesis site continues to drain. Leg swelling is better.   Objective: Vital Signs  Filed Vitals:   12/12/13 1200 12/12/13 1402 12/12/13 2128 12/13/13 0604  BP: 124/78 133/74 105/65 131/69  Pulse: 99 95 96 98  Temp:  98.5 F (36.9 C) 97.6 F (36.4 C) 98.7 F (37.1 C)  TempSrc:  Oral Oral Oral  Resp:  18 18 18   Height:      Weight:    117.2 kg (258 lb 6.1 oz)  SpO2:  98% 93% 95%    Intake/Output Summary (Last 24 hours) at 12/13/13 0827 Last data filed at 12/12/13 1844  Gross per 24 hour  Intake      0 ml  Output   1600 ml  Net  -1600 ml   Filed Weights   12/11/13 0605 12/11/13 1313 12/13/13 0604  Weight: 122.6 kg (270 lb 4.5 oz) 117.5 kg (259 lb 0.7  oz) 117.2 kg (258 lb 6.1 oz)   General appearance: alert, cooperative, appears stated age and no distress Resp: decreased air entry at bases without definite crackles. No wheezing. Cardio: regular rate and rhythm, S1, S2 normal, no murmur, click, rub or gallop GI: Soft, significantly distended with ascites, NT. BS present. No masses or organomegaly. Male genitalia: edematous scrotum bilaterally. Extremities: edema 2+ bil LE Skin: old shallow ulcers seen bil LE covered with dressing Neurologic: No focal deficits.  Lab Results:  Basic Metabolic Panel:  Recent Labs Lab 12/10/13 1630 12/11/13 0535 12/12/13 0535 12/13/13 0519  NA 130* 129* 128* 130*  K 4.0 4.2 4.0 4.4  CL 94* 95* 94* 96  CO2 25 26 27 26   GLUCOSE 111* 93 102* 125*  BUN 19 19 20 22   CREATININE 0.99 1.01 1.02 0.98  CALCIUM 8.3* 8.1* 8.3* 8.7   Liver Function Tests:  Recent Labs Lab 12/10/13 1630 12/11/13 0535  AST 77* 71*  ALT 44 41  ALKPHOS 141* 119*  BILITOT 1.1 1.1  PROT 6.7 6.1  ALBUMIN 2.3* 2.2*    Recent Labs Lab 12/10/13 1930  AMMONIA 42   CBC:  Recent Labs Lab 12/10/13 1630 12/11/13 0535 12/12/13 0535 12/13/13 0519  WBC 6.3 5.3 5.5 7.9  NEUTROABS 3.8  --   --   --  HGB 9.8* 9.1* 9.5* 10.3*  HCT 28.5* 28.1* 29.1* 31.7*  MCV 91.6 93.7 93.6 94.3  PLT 97* 86* 87* 91*   Cardiac Enzymes:  Recent Labs Lab 12/10/13 1630 12/10/13 2005  TROPONINI <0.30 <0.30   BNP (last 3 results)  Recent Labs  12/10/13 1630  PROBNP 282.6*    Studies/Results: US Paracentesis  12/11/2013   CLINICAL DATA:  Cirrhosis, abdominal distention secondary to ascites. Request diagnostic and therapeutic paracentesis.  EXAM: ULTRASOUND GUIDED PARACENTESIS  COMPARISON:  None.  PROCEDURE: An ultrasound guided paracentesis was thoroughly discussed with the patient and questions answered. The benefits, risks, alternatives and complications were also discussed. The patient understands and wishes to proceed with  the procedure. Written consent was obtained.  Ultrasound was performed to localize and mark an adequate pocket of fluid in the left lower quadrant of the abdomen. The area was then prepped and draped in the normal sterile fashion. 1% Lidocaine was used for local anesthesia. Under ultrasound guidance a 19 gauge Yueh catheter was introduced. Paracentesis was performed. The catheter was removed and a dressing applied.  COMPLICATIONS: None immediate  FINDINGS: A total of approximately . 6 L. of cloudy yellow fluid was removed. A fluid sample was sent for laboratory analysis.  IMPRESSION: Successful ultrasound guided paracentesis yielding 6 L of ascites.  Read by: Ascencion Dike PA-C   Electronically Signed   By: Aletta Edouard M.D.   On: 12/11/2013 11:26    Medications:  Scheduled: . feeding supplement (ENSURE COMPLETE)  237 mL Oral TID WC  . folic acid  1 mg Oral Daily  . furosemide  40 mg Intravenous Once  . furosemide  80 mg Intravenous BID  . lactulose  20 g Oral TID  . levofloxacin  500 mg Oral Daily  . multivitamin with minerals  1 tablet Oral Daily  . nicotine  14 mg Transdermal Daily  . pantoprazole  40 mg Oral BID  . sodium chloride  3 mL Intravenous Q12H  . spironolactone  200 mg Oral Daily  . thiamine  100 mg Oral Daily   Continuous:  ZOX:WRUE & mag hydroxide-simeth, LORazepam, LORazepam, ondansetron (ZOFRAN) IV, ondansetron  Assessment/Plan:  Principal Problem:   Ascites Active Problems:   Smoking   Cirrhosis with alcoholism   Mild Coagulopathy   Acute respiratory failure   Esophageal varices with bleeding   Hepatitis C   Cellulitis   Acute CHF   HCAP (healthcare-associated pneumonia)    Ascites  He is S/p paracentesis on 2/11. He is feeling better. Continue diuretics. Still quite distended and oozing noted from the site. Discussed with IR. He will benefit from repeat paracentesis to remove more fluid. Ordered for 2/13.  Decompensated Cirrhosis of liver and hepatic  encephalopathy and Hyponatremia  Continue lactulose. INR is elevated. OP follow up with hepatology. Continue IV diuretics. He is diuresing well. ECHO done and reveals normal systolic function. Cardiac etiology is unlikely. Strict I/O. Daily weights. Monitor lytes.  Possible Healthcare associated pneumonia  Patient denies any episodes suggestive of aspiration. CXR suggested infiltrate. Patient does not have a cough per se. No blood cultures obtained. Now on oral Levaquin.   Chronic alcohol abuse  Monitoring on telemetry and also on CIWA protocol. Patient counseled to stop alcohol consumption.  Active smoker  Smoking cessation counseling was done. Nicotine patch at present   Thrombocytopenia and elevated INR  Due to hepatic cirrhosis. He also has history of esophageal varices. Stable.  History of esophageal varices  He is status  post banding in January. Protonix twice a day   History of Schizophrenia Behavior is appropriate though he does seem not to understand the extent of his disease despite multiple discussions regarding same. Poor insight. Not on any medications at this time.  Code Status: Full Code. Will need to readdressed in near future.  DVT Prophylaxis: SCD's    Family Communication: Discussed with patient  Disposition Plan: Not ready for discharge. Will need IV diuresis for at least one more day. SW following. PT eval not done yet. He will likely need SNF.    LOS: 3 days   [place] [facility] Pager [phone] 12/13/2013, 8:27 AM  If 8PM-8AM, please contact night-coverage at [email or web address], password [facility]

## 2013-12-13 NOTE — Procedures (Signed)
Successful US guided paracentesis from RLQ.  Yielded 6.1L of clear yellow fluid.  No immediate complications.  Pt tolerated well.  Puncture from yesterday and today's procedure were Dermabonded to prevent leak, though he still has significant soft tissue edema, which may cause weeping again.  Specimen was not sent for labs.  Ascencion Dike PA-C 12/13/2013 10:40 AM

## 2013-12-13 NOTE — Progress Notes (Addendum)
Clinical Social Work  CSW continues to search for placement. CSW called several SNFs inquiring about bed availability. At this time, [facility], [facility] and [facility] is considering. CSW called other facilities as well to inquire if they had any Medicaid beds available as well. CSW had to leave a message at [facility], [employer] in [place], and [facility] (previously known as [facility]). CSW called [facility] who reports unable to manage medical needs of patient.   CSW will continue to follow in order to assist with bed placement.  [place], [place] returned call and reported no Medicaid beds available. [place] has left a message with Department of Social Services in order to verify Medicaid information and will call CSW back once she has obtained information.

## 2013-12-13 NOTE — Evaluation (Signed)
Occupational Therapy Evaluation Patient Details Name: Oscar Swanson MRN: [id number] DOB: 1960/12/18 Today's Date: 12/13/2013 Time: 0932-6712 OT Time Calculation (min): 19 min  OT Assessment / Plan / Recommendation History of present illness 53 yo male admitted with massive ascites, SOB. Hx of cirrhosis, hep encephalopathy, shizonphrenia, DM. Lives alone in apt.    Clinical Impression   Pt was admitted for massive ascites and he is s/p paracentesis today.  He presents with generalized weakness and pain and will benefit from skilled OT to increase safety and independence with adls.  Goals in acute are for supervision to min A level.  Pt will likely need stsnf as he lives alone.  He indicated that he was struggling with adls and doing what he could.      OT Assessment  Patient needs continued OT Services    Follow Up Recommendations  SNF    Barriers to Discharge      Equipment Recommendations  3 in 1 bedside comode    Recommendations for Other Services    Frequency  Min 2X/week    Precautions / Restrictions Precautions Precautions: Fall Restrictions Weight Bearing Restrictions: No   Pertinent Vitals/Pain Stomach sore; HR 114 ambulating and sats 94% on RA.  Replaced 02 after eval    ADL  Grooming: Set up Where Assessed - Grooming: Unsupported sitting Upper Body Bathing: Set up Where Assessed - Upper Body Bathing: Unsupported sitting Lower Body Bathing: Maximal assistance Where Assessed - Lower Body Bathing: Supported sit to stand Upper Body Dressing: Minimal assistance Where Assessed - Upper Body Dressing: Unsupported sitting Lower Body Dressing: +1 Total assistance Where Assessed - Lower Body Dressing: Supported sit to stand Toilet Transfer: Pharmacologist Method: Sit to Loss adjuster, chartered: Therapist, occupational and Hygiene: Moderate assistance Where Assessed - Toileting Clothing Manipulation and  Hygiene: Sit to stand from 3-in-1 or toilet Equipment Used: Rolling walker Transfers/Ambulation Related to ADLs: ambulated without 02 with RW and min A.  Sats 94%; HR 114 ADL Comments: has difficulty reaching beyond knees.  Will further assess AE for adls    OT Diagnosis: Generalized weakness  OT Problem List: Decreased strength;Decreased activity tolerance;Impaired balance (sitting and/or standing);Decreased knowledge of use of DME or AE;Pain OT Treatment Interventions: Self-care/ADL training;DME and/or AE instruction;Balance training;Patient/family education;Energy conservation   OT Goals(Current goals can be found in the care plan section) Acute Rehab OT Goals Patient Stated Goal: none stated; agreeable to therapy evals and OOB OT Goal Formulation: With patient Time For Goal Achievement: 12/27/13 Potential to Achieve Goals: Good ADL Goals Pt Will Perform Lower Body Bathing: with min assist;with adaptive equipment;sit to/from stand Pt Will Perform Lower Body Dressing: with min assist;with adaptive equipment;sit to/from stand Pt Will Transfer to Toilet: with min guard assist;ambulating;bedside commode Pt Will Perform Toileting - Clothing Manipulation and hygiene: with supervision;sit to/from stand Additional ADL Goal #1: pt will perform bed mobility with supervision in preparation for adls  Visit Information  Last OT Received On: 12/13/13 Assistance Needed: +1 PT/OT/SLP Co-Evaluation/Treatment: Yes Reason for Co-Treatment:  (decreased endurance of pt) PT goals addressed during session: Mobility/safety with mobility OT goals addressed during session: ADL's and self-care History of Present Illness: 53 yo male admitted with massive ascites, SOB. Hx of cirrhosis, hep encephalopathy, shizonphrenia, DM. Lives alone in apt.        Prior [place] expects to be discharged to:: Skilled nursing facility Living Arrangements: Alone Available Help at  Discharge: Family Type of Home: Apartment Home Access: Level entry Home Layout: One level Home Equipment: Cane - single point;Walker - 2 wheels Additional Comments: standard toilet Prior Function Level of Independence: Needs assistance Comments: has been struggling with adls and only doing minimum.  Difficulty with LB Communication Communication: No difficulties (difficult to understand at times; therapy woke him)         Vision/Perception     Cognition  Cognition Arousal/Alertness: Awake/alert Behavior During Therapy: WFL for tasks assessed/performed Overall Cognitive Status: Within Functional Limits for tasks assessed    Extremity/Trunk Assessment Upper Extremity Assessment Upper Extremity Assessment: Generalized weakness (grossly 4-/5 bil) Lower Extremity Assessment Lower Extremity Assessment: Generalized weakness Cervical / Trunk Assessment Cervical / Trunk Assessment: Normal     Mobility Bed Mobility Overal bed mobility: Needs Assistance Bed Mobility: Supine to Sit;Sit to Supine Supine to sit: Mod assist;HOB elevated Sit to supine: Mod assist;HOB elevated General bed mobility comments: Assist for bil LEs and trunk. Increased time.  Transfers Overall transfer level: Needs assistance Transfers: Sit to/from Stand Sit to Stand: Min assist;From elevated surface General transfer comment: assist to rise, stabilize, control descent. VCs safety, technique, hand placement     Exercise     Balance     End of Session OT - End of Session Activity Tolerance: Patient limited by fatigue Patient left: in bed;with call bell/phone within reach;with bed alarm set  [place] 12/13/2013, 3:33 PM Lesle Chris, OTR/L [phone] 12/13/2013

## 2013-12-13 NOTE — Evaluation (Signed)
Physical Therapy Evaluation Patient Details Name: Oscar Swanson MRN: [id number] DOB: 03/06/1961 Today's Date: 12/13/2013 Time: 9509-3267 PT Time Calculation (min): 20 min  PT Assessment / Plan / Recommendation History of Present Illness  53 yo male admitted with massive ascites, SOB. Hx of cirrhosis, hep encephalopathy, shizonphrenia, DM. Lives alone in apt.   Clinical Impression  On eval, pt required Mod assist for mobility-able to ambulate ~100 feet with walker. Demonstrates general weakness, decreased activity tolerance, and impaired gait and balance. Feel pt would benefit from [place] [facility] at [facility].     PT Assessment  Patient needs continued PT services    Follow Up Recommendations  SNF    Does the patient have the potential to tolerate intense rehabilitation      Barriers to Discharge        Equipment Recommendations  Rolling walker with 5" wheels    Recommendations for Other Services OT consult   Frequency Min 3X/week    Precautions / Restrictions Precautions Precautions: Fall Restrictions Weight Bearing Restrictions: No   Pertinent Vitals/Pain Abdomen 5/10       Mobility  Bed Mobility Overal bed mobility: Needs Assistance Bed Mobility: Supine to Sit;Sit to Supine Supine to sit: Mod assist;HOB elevated Sit to supine: Mod assist;HOB elevated General bed mobility comments: Assist for bil LEs and trunk. Increased time.  Transfers Overall transfer level: Needs assistance Transfers: Sit to/from Stand Sit to Stand: Min assist;From elevated surface General transfer comment: assist to rise, stabilize, control descent. VCs safety, technique, hand placement Ambulation/Gait Ambulation/Gait assistance: Min assist Ambulation Distance (Feet): 100 Feet Assistive device: Rolling walker (2 wheeled) Gait Pattern/deviations: Decreased stride length;Wide base of support;Step-through pattern;Trunk flexed General Gait Details: slow gait speed. VCS safety, distance from walker.  O2 sats 97% on RA, dyspnea 3/4. 1 standing rest break after ~50 feet. Assist to stabilize throughout.     Exercises     PT Diagnosis: Difficulty walking;Generalized weakness  PT Problem List: Decreased strength;Decreased activity tolerance;Decreased mobility;Decreased balance;Decreased knowledge of use of DME PT Treatment Interventions: DME instruction;Gait training;Functional mobility training;Therapeutic activities;Therapeutic exercise;Patient/family education;Balance training     PT Goals(Current goals can be found in the care plan section)    Visit Information  Last PT Received On: 12/13/13 Assistance Needed: +1 History of Present Illness: 53 yo male admitted with massive ascites, SOB. Hx of cirrhosis, hep encephalopathy, shizonphrenia, DM. Lives alone in apt.        Prior [place] expects to be discharged to:: Skilled nursing facility Living Arrangements: Alone Available Help at Discharge: Family Type of Home: Apartment Home Access: Level entry Home Layout: One level Home Equipment: Cane - single point;Walker - 2 wheels Prior Function Level of Independence: Independent with assistive device(s) Comments: sister drives him to the grocery store and for appointments Communication Communication: No difficulties    Cognition  Cognition Arousal/Alertness: Awake/alert Behavior During Therapy: WFL for tasks assessed/performed Overall Cognitive Status: Within Functional Limits for tasks assessed    Extremity/Trunk Assessment Upper Extremity Assessment Upper Extremity Assessment: Generalized weakness Lower Extremity Assessment Lower Extremity Assessment: Generalized weakness Cervical / Trunk Assessment Cervical / Trunk Assessment: Normal   Balance    End of Session PT - End of Session Activity Tolerance: Patient limited by fatigue Patient left: in bed;with call bell/phone within reach  GP     Weston Anna, MPT Pager: [phone]

## 2013-12-13 NOTE — Progress Notes (Signed)
OT Cancellation Note  Patient Details Name: Oscar Swanson MRN: [id number] DOB: [birth year]   Cancelled Treatment:    Reason Eval/Treat Not Completed: Patient at procedure or test/ unavailable Will re attempt as schedule allows  Betsy Pries 12/13/2013, 9:34 AM

## 2013-12-14 ENCOUNTER — Encounter (HOSPITAL_COMMUNITY): Payer: Self-pay | Admitting: Internal Medicine

## 2013-12-14 DIAGNOSIS — F209 Schizophrenia, unspecified: Secondary | ICD-10-CM

## 2013-12-14 LAB — BODY FLUID CULTURE
CULTURE: NO GROWTH
Gram Stain: NONE SEEN

## 2013-12-14 LAB — AMMONIA: Ammonia: 77 umol/L — ABNORMAL HIGH (ref 11–60)

## 2013-12-14 MED ORDER — FUROSEMIDE 80 MG PO TABS
80.0000 mg | ORAL_TABLET | Freq: Two times a day (BID) | ORAL | Status: DC
  Administered 2013-12-14 – 2013-12-16 (×4): 80 mg via ORAL
  Administered 2013-12-16: 40 mg via ORAL
  Administered 2013-12-17 – 2013-12-18 (×3): 80 mg via ORAL
  Filled 2013-12-14 (×10): qty 1

## 2013-12-14 MED ORDER — TRAMADOL HCL 50 MG PO TABS
50.0000 mg | ORAL_TABLET | Freq: Three times a day (TID) | ORAL | Status: AC
  Administered 2013-12-14 (×3): 50 mg via ORAL
  Filled 2013-12-14 (×3): qty 1

## 2013-12-14 MED ORDER — LACTULOSE 10 GM/15ML PO SOLN
30.0000 g | Freq: Three times a day (TID) | ORAL | Status: DC
  Administered 2013-12-14 – 2013-12-23 (×24): 30 g via ORAL
  Administered 2013-12-23: 20 g via ORAL
  Administered 2013-12-23 – 2013-12-30 (×16): 30 g via ORAL
  Filled 2013-12-14 (×51): qty 45

## 2013-12-14 NOTE — Progress Notes (Signed)
[facility] PROGRESS NOTE  Oscar Swanson [id number] DOB: August 19, 1961 DOA: 12/10/2013  PCP: Angelica Chessman, MD  Brief HPI: Oscar Swanson is a 53 y.o. male with Past medical history of alcohol cirrhosis, hepatic encephalopathy, active smoker, esophageal varices, chronic thrombocytopenia. Patient presented with complaints of abdominal distention and shortness of breath. He was seen at GI clinic and was recommended for admission for tense ascites. He has bilateral leg swelling which worsened and was significantly red one week ago for which he was seen in the community clinic and was placed on oral Bactrim.   Past medical history:  Past Medical History  Diagnosis Date  . Diabetes mellitus without complication   . Schizophrenia     onset age 44.  in and out of psych facilities from age 4 to age 89.  s/p electroconvulsive therapy.  . TB (tuberculosis)     sister says this dx'd at age 19 and treated  . Hepatic encephalopathy   . Esophageal varices   . GI bleed   . Cirrhosis   . Thrombocytopenia   . Alcohol abuse     Consultants: None  Procedures: US guided Paracentesis 2/11 and 2/13  Antibiotics: IV Vanc 2/10-->2/12 IV Cefepime 2/10-->2/12 Levaquin 2/12-->  Subjective: Patient feels better. Oozing from paracentesis site has stopped.   Objective: Vital Signs  Filed Vitals:   12/13/13 1507 12/13/13 1508 12/13/13 2243 12/14/13 0611  BP: 110/66  122/68 123/73  Pulse: 100  106 103  Temp: 98.3 F (36.8 C)  98.4 F (36.9 C) 98 F (36.7 C)  TempSrc: Oral  Oral Oral  Resp: 20  18 18   Height:      Weight:      SpO2: 95% 96% 94% 95%    Intake/Output Summary (Last 24 hours) at 12/14/13 1033 Last data filed at 12/14/13 0900  Gross per 24 hour  Intake    720 ml  Output   1801 ml  Net  -1081 ml   Filed Weights   12/11/13 0605 12/11/13 1313 12/13/13 0604  Weight: 122.6 kg (270 lb 4.5 oz) 117.5 kg (259 lb 0.7 oz) 117.2 kg (258 lb 6.1 oz)   General  appearance: alert, cooperative, appears stated age and no distress Resp: decreased air entry at bases without definite crackles. No wheezing. Cardio: regular rate and rhythm, S1, S2 normal, no murmur, click, rub or gallop GI: Soft, significantly distended with ascites, NT. BS present. No masses or organomegaly. Male genitalia: edematous scrotum bilaterally. Extremities: edema bil LE is improving. Skin: old shallow ulcers seen bil LE covered with dressing Neurologic: No focal deficits.  Lab Results:  Basic Metabolic Panel:  Recent Labs Lab 12/10/13 1630 12/11/13 0535 12/12/13 0535 12/13/13 0519  NA 130* 129* 128* 130*  K 4.0 4.2 4.0 4.4  CL 94* 95* 94* 96  CO2 25 26 27 26   GLUCOSE 111* 93 102* 125*  BUN 19 19 20 22   CREATININE 0.99 1.01 1.02 0.98  CALCIUM 8.3* 8.1* 8.3* 8.7   Liver Function Tests:  Recent Labs Lab 12/10/13 1630 12/11/13 0535  AST 77* 71*  ALT 44 41  ALKPHOS 141* 119*  BILITOT 1.1 1.1  PROT 6.7 6.1  ALBUMIN 2.3* 2.2*    Recent Labs Lab 12/10/13 1930  AMMONIA 42   CBC:  Recent Labs Lab 12/10/13 1630 12/11/13 0535 12/12/13 0535 12/13/13 0519  WBC 6.3 5.3 5.5 7.9  NEUTROABS 3.8  --   --   --   HGB 9.8* 9.1* 9.5*  10.3*  HCT 28.5* 28.1* 29.1* 31.7*  MCV 91.6 93.7 93.6 94.3  PLT 97* 86* 87* 91*   Cardiac Enzymes:  Recent Labs Lab 12/10/13 1630 12/10/13 2005  TROPONINI <0.30 <0.30   BNP (last 3 results)  Recent Labs  12/10/13 1630  PROBNP 282.6*    Studies/Results: US Paracentesis  12/13/2013   CLINICAL DATA:  Cirrhosis, ascites. Request therapeutic paracentesis.  EXAM: ULTRASOUND GUIDED PARACENTESIS  COMPARISON:  Previous paracentesis  PROCEDURE: An ultrasound guided paracentesis was thoroughly discussed with the patient and questions answered. The benefits, risks, alternatives and complications were also discussed. The patient understands and wishes to proceed with the procedure. Written consent was obtained.  Ultrasound was  performed to localize and mark an adequate pocket of fluid in the right lower quadrant of the abdomen. The area was then prepped and draped in the normal sterile fashion. 1% Lidocaine was used for local anesthesia. Under ultrasound guidance a 19 gauge Yueh catheter was introduced. Paracentesis was performed. The catheter was removed and a dressing applied.  COMPLICATIONS: None immediate  FINDINGS: A total of approximately 6.1 L of clear yellow fluid was removed. A fluid sample was not sent for laboratory analysis.  IMPRESSION: Successful ultrasound guided paracentesis yielding 6.1 L of ascites.  Read by: Ascencion Dike PA-C   Electronically Signed   By: Aletta Edouard M.D.   On: 12/13/2013 11:07    Medications:  Scheduled: . feeding supplement (ENSURE COMPLETE)  237 mL Oral TID WC  . folic acid  1 mg Oral Daily  . furosemide  40 mg Intravenous Once  . furosemide  80 mg Oral BID  . lactulose  20 g Oral TID  . levofloxacin  500 mg Oral Daily  . multivitamin with minerals  1 tablet Oral Daily  . nicotine  14 mg Transdermal Daily  . pantoprazole  40 mg Oral BID  . sodium chloride  3 mL Intravenous Q12H  . spironolactone  200 mg Oral Daily  . thiamine  100 mg Oral Daily  . traMADol  50 mg Oral 3 x daily with food   Continuous:  [facility]:NOBS & mag hydroxide-simeth, ondansetron (ZOFRAN) IV, ondansetron  Assessment/Plan:  Principal Problem:   Ascites Active Problems:   Smoking   Cirrhosis with alcoholism   Mild Coagulopathy   Acute respiratory failure   Esophageal varices with bleeding   Hepatitis C   Cellulitis   Acute CHF   HCAP (healthcare-associated pneumonia)    Ascites  He is S/p paracentesis on 2/11 and repeated on 2/13. Total 12 ltrs removed. Continue diuretics but change to oral as he pulled out his foley. He is diuresing well.   Decompensated Cirrhosis of liver and hepatic encephalopathy and Hyponatremia  Continue lactulose. INR is elevated. OP follow up with hepatology. He  is diuresing well. ECHO done and reveals normal systolic function. Cardiac etiology is unlikely. Strict I/O. Daily weights. Monitor lytes. Check Ammonia level today as he seems slightly confused.  Possible Healthcare associated pneumonia  Patient denies any episodes suggestive of aspiration. CXR suggested infiltrate. Patient does not have a cough per se. No blood cultures obtained. Now on oral Levaquin.   Chronic alcohol abuse  Monitoring on telemetry and also on CIWA protocol. Patient counseled to stop alcohol consumption.  Active smoker  Smoking cessation counseling was done. Nicotine patch at present   Thrombocytopenia and elevated INR  Due to hepatic cirrhosis. He also has history of esophageal varices. Stable.  History of esophageal varices  He is  status post banding in January. Protonix twice a day   History of Schizophrenia Behavior is appropriate though he does seem not to understand the extent of his disease despite multiple discussions regarding same. Poor insight. Not on any psychotropic medications at this time.  Code Status: Full Code. Will need to readdressed in near future.  DVT Prophylaxis: SCD's    Family Communication: Discussed with patient  Disposition Plan: Plan is for SNF, possibly early next week.     LOS: 4 days   [facility] Pager [phone] 12/14/2013, 10:33 AM  If 8PM-8AM, please contact night-coverage at [email or web address], password [facility]

## 2013-12-14 NOTE — Progress Notes (Signed)
Pt pulled out urinary catheter. Refused reinsertion even after educating patient about need for. Pt is on high dose IV lasix. Madelin Headings.

## 2013-12-15 DIAGNOSIS — K7290 Hepatic failure, unspecified without coma: Secondary | ICD-10-CM

## 2013-12-15 DIAGNOSIS — K7682 Hepatic encephalopathy: Secondary | ICD-10-CM

## 2013-12-15 LAB — BASIC METABOLIC PANEL
BUN: 20 mg/dL (ref 6–23)
CALCIUM: 8.6 mg/dL (ref 8.4–10.5)
CO2: 26 mEq/L (ref 19–32)
CREATININE: 0.89 mg/dL (ref 0.50–1.35)
Chloride: 93 mEq/L — ABNORMAL LOW (ref 96–112)
GFR calc Af Amer: >90 mL/min (ref >90)
GLUCOSE: 101 mg/dL — AB (ref 70–99)
Potassium: 4.4 mEq/L (ref 3.7–5.3)
Sodium: 127 mEq/L — ABNORMAL LOW (ref 137–147)

## 2013-12-15 MED ORDER — OXYCODONE HCL 5 MG PO TABS
5.0000 mg | ORAL_TABLET | Freq: Once | ORAL | Status: AC
Start: 2013-12-15 — End: 2013-12-15
  Administered 2013-12-15: 5 mg via ORAL
  Filled 2013-12-15: qty 1

## 2013-12-15 NOTE — Progress Notes (Signed)
[facility] PROGRESS NOTE  Oscar Swanson YPP:[id number] DOB: 1961/07/16 DOA: 12/10/2013  PCP: Angelica Chessman, MD  Brief HPI: Oscar Swanson is a 53 y.o. male with Past medical history of alcohol cirrhosis, hepatic encephalopathy, active smoker, esophageal varices, chronic thrombocytopenia. Patient presented with complaints of abdominal distention and shortness of breath. He was seen at GI clinic and was recommended for admission for tense ascites. He has bilateral leg swelling which worsened and was significantly red one week ago for which he was seen in the community clinic and was placed on oral Bactrim.   Past medical history:  Past Medical History  Diagnosis Date  . Diabetes mellitus without complication   . Schizophrenia     onset age 76.  in and out of psych facilities from age 64 to age 46.  s/p electroconvulsive therapy.  . TB (tuberculosis)     sister says this dx'd at age 29 and treated  . Hepatic encephalopathy   . Esophageal varices   . GI bleed   . Cirrhosis   . Thrombocytopenia   . Alcohol abuse     Consultants: None  Procedures: US guided Paracentesis 2/11 and 2/13  Antibiotics: IV Vanc 2/10-->2/12 IV Cefepime 2/10-->2/12 Levaquin 2/12-->  Subjective: Patient feels same. Still has significantly distended abdomen. Oozing from paracentesis site has stopped.   Objective: Vital Signs  Filed Vitals:   12/14/13 2147 12/14/13 2355 12/15/13 0551 12/15/13 0718  BP: 111/63 108/58 111/65   Pulse: 99 97 90   Temp: 97.7 F (36.5 C)  97.4 F (36.3 C)   TempSrc: Oral  Oral   Resp: 20  18   Height:      Weight:    110.1 kg (242 lb 11.6 oz)  SpO2: 96%  95%     Intake/Output Summary (Last 24 hours) at 12/15/13 1115 Last data filed at 12/15/13 1005  Gross per 24 hour  Intake   1080 ml  Output    401 ml  Net    679 ml   Filed Weights   12/11/13 1313 12/13/13 0604 12/15/13 0718  Weight: 117.5 kg (259 lb 0.7 oz) 117.2 kg (258 lb 6.1 oz) 110.1  kg (242 lb 11.6 oz)   General appearance: alert, cooperative, appears stated age and no distress Resp: decreased air entry at bases without definite crackles. No wheezing. Cardio: regular rate and rhythm, S1, S2 normal, no murmur, click, rub or gallop GI: Soft, significantly distended with ascites, NT. BS present. No masses or organomegaly. Male genitalia: edematous scrotum bilaterally. Extremities: edema bil LE is improving. Skin: old shallow ulcers seen bil LE covered with dressing Neurologic: No focal deficits.  Lab Results:  Basic Metabolic Panel:  Recent Labs Lab 12/10/13 1630 12/11/13 0535 12/12/13 0535 12/13/13 0519 12/15/13 0547  NA 130* 129* 128* 130* 127*  K 4.0 4.2 4.0 4.4 4.4  CL 94* 95* 94* 96 93*  CO2 25 26 27 26 26   GLUCOSE 111* 93 102* 125* 101*  BUN 19 19 20 22 20   CREATININE 0.99 1.01 1.02 0.98 0.89  CALCIUM 8.3* 8.1* 8.3* 8.7 8.6   Liver Function Tests:  Recent Labs Lab 12/10/13 1630 12/11/13 0535  AST 77* 71*  ALT 44 41  ALKPHOS 141* 119*  BILITOT 1.1 1.1  PROT 6.7 6.1  ALBUMIN 2.3* 2.2*    Recent Labs Lab 12/10/13 1930 12/14/13 1110  AMMONIA 42 77*   CBC:  Recent Labs Lab 12/10/13 1630 12/11/13 0535 12/12/13 0535 12/13/13 4580  WBC 6.3 5.3 5.5 7.9  NEUTROABS 3.8  --   --   --   HGB 9.8* 9.1* 9.5* 10.3*  HCT 28.5* 28.1* 29.1* 31.7*  MCV 91.6 93.7 93.6 94.3  PLT 97* 86* 87* 91*   Cardiac Enzymes:  Recent Labs Lab 12/10/13 1630 12/10/13 2005  TROPONINI <0.30 <0.30   BNP (last 3 results)  Recent Labs  12/10/13 1630  PROBNP 282.6*    Studies/Results: No results found.  Medications:  Scheduled: . feeding supplement (ENSURE COMPLETE)  237 mL Oral TID WC  . folic acid  1 mg Oral Daily  . furosemide  40 mg Intravenous Once  . furosemide  80 mg Oral BID  . lactulose  30 g Oral TID  . levofloxacin  500 mg Oral Daily  . multivitamin with minerals  1 tablet Oral Daily  . nicotine  14 mg Transdermal Daily  .  pantoprazole  40 mg Oral BID  . sodium chloride  3 mL Intravenous Q12H  . spironolactone  200 mg Oral Daily  . thiamine  100 mg Oral Daily   Continuous:  JJO:ACZY & mag hydroxide-simeth, ondansetron (ZOFRAN) IV, ondansetron  Assessment/Plan:  Principal Problem:   Ascites Active Problems:   Smoking   Cirrhosis with alcoholism   Mild Coagulopathy   Acute respiratory failure   Esophageal varices with bleeding   Hepatitis C   Cellulitis   Acute CHF   HCAP (healthcare-associated pneumonia)    Ascites  He is S/p paracentesis on 2/11 and repeated on 2/13. Total 12 ltrs removed. Continue diuretics but changed to oral as he pulled out his foley 2/14. He is diuresing well.   Decompensated Cirrhosis of liver and hepatic encephalopathy and Hyponatremia  Continue lactulose. INR is elevated. OP follow up with hepatology. He is diuresing well. ECHO done and reveals normal systolic function. Cardiac etiology is unlikely. Strict I/O. Daily weights. Monitor lytes. Ammonia level was slightly elevated. Lactulose dose was increased. His long term prognosis is poor.  Possible Healthcare associated pneumonia  Patient denies any episodes suggestive of aspiration. CXR suggested infiltrate. Patient does not have a cough per se. No blood cultures obtained. Now on oral Levaquin.   Chronic alcohol abuse  Monitoring on telemetry and also on CIWA protocol. No withdrawal symptoms. Patient counseled to stop alcohol consumption.  Active smoker  Smoking cessation counseling was done. Nicotine patch at present   Thrombocytopenia and elevated INR  Due to hepatic cirrhosis. He also has history of esophageal varices. Stable.  History of esophageal varices  He is status post banding in January. Protonix twice a day   History of Schizophrenia Behavior is appropriate though he does seem not to understand the extent of his disease despite multiple discussions regarding same. Poor insight. Not on any psychotropic  medications at this time.  Code Status: Full Code. Discussed with patient today. He will think about this. DVT Prophylaxis: SCD's    Family Communication: Discussed with patient and his sister. Disposition Plan: Plan is for SNF, possibly in 1-2 days.     LOS: 5 days   [facility] Pager [phone] 12/15/2013, 11:15 AM  If 8PM-8AM, please contact night-coverage at [email or web address], password [facility]

## 2013-12-16 MED ORDER — ZOLPIDEM TARTRATE 5 MG PO TABS
5.0000 mg | ORAL_TABLET | Freq: Once | ORAL | Status: AC
  Administered 2013-12-16: 5 mg via ORAL
  Filled 2013-12-16: qty 1

## 2013-12-16 MED ORDER — ALUM & MAG HYDROXIDE-SIMETH 200-200-20 MG/5ML PO SUSP
15.0000 mL | ORAL | Status: DC | PRN
  Administered 2013-12-16 – 2013-12-25 (×11): 15 mL via ORAL
  Filled 2013-12-16 (×12): qty 30

## 2013-12-16 MED ORDER — OXYCODONE HCL 5 MG PO TABS
5.0000 mg | ORAL_TABLET | Freq: Four times a day (QID) | ORAL | Status: DC | PRN
  Administered 2013-12-16 – 2013-12-30 (×17): 5 mg via ORAL
  Filled 2013-12-16 (×17): qty 1

## 2013-12-16 NOTE — Progress Notes (Addendum)
Clinical Social Work  CSW continues to work on placement for patient. CSW called [facility] who reports no Medicaid beds available. CSW left a message with [facility]. CSW spoke with admissions coordinators at [facility] and [facility] who reports they will discuss patient in morning meetings and call CSW back.  CSW will continue to follow.  [place], [place] [phone]  [facility] called and stated that they are unable to accept patient. CSW continues to wait to hear from other SNFs re: placement. CSW updated RN and MD on barriers during progression meeting.  [facility] is unable to accept patient because no available beds. [facility] reports they have called DSS case worker and left several messages but policy states they must speak with DSS prior to accepting patient. CSW expanded search and left a message with [facility]. CSW spoke with [facility] and [facility] who report no Medicaid only beds available. CSW spoke with [facility] who will review information and call CSW back.

## 2013-12-16 NOTE — Progress Notes (Signed)
[facility] PROGRESS NOTE  JIOVANNY BURDELL BJS:[id number] DOB: [birth year] DOA: 12/10/2013  PCP: Angelica Chessman, MD  Brief HPI: EURA RADABAUGH is a 53 y.o. male with Past medical history of alcohol cirrhosis, hepatic encephalopathy, active smoker, esophageal varices, chronic thrombocytopenia. Patient presented with complaints of abdominal distention and shortness of breath. He was seen at GI clinic and was recommended for admission for tense ascites. He has bilateral leg swelling which worsened and was significantly red one week ago for which he was seen in the community clinic and was placed on oral Bactrim.   Past medical history:  Past Medical History  Diagnosis Date  . Diabetes mellitus without complication   . Schizophrenia     onset age 63.  in and out of psych facilities from age 79 to age 31.  s/p electroconvulsive therapy.  . TB (tuberculosis)     sister says this dx'd at age 8 and treated  . Hepatic encephalopathy   . Esophageal varices   . GI bleed   . Cirrhosis   . Thrombocytopenia   . Alcohol abuse     Consultants: None  Procedures: US guided Paracentesis 2/11 and 2/13  Antibiotics: IV Vanc 2/10-->2/12 IV Cefepime 2/10-->2/12 Levaquin 2/12-->  Subjective: Patient feels same. Still has significantly distended abdomen. Feels bloated and Maalox helps.   Objective: Vital Signs  Filed Vitals:   12/15/13 0718 12/15/13 2201 12/16/13 0600 12/16/13 0745  BP:  116/65 113/65   Pulse:  94 87   Temp:  98.2 F (36.8 C) 98 F (36.7 C)   TempSrc:  Oral Oral   Resp:  18 18   Height:      Weight: 110.1 kg (242 lb 11.6 oz)   113.7 kg (250 lb 10.6 oz)  SpO2:  96% 95%     Intake/Output Summary (Last 24 hours) at 12/16/13 0838 Last data filed at 12/16/13 0245  Gross per 24 hour  Intake   1680 ml  Output    400 ml  Net   1280 ml   Filed Weights   12/13/13 0604 12/15/13 0718 12/16/13 0745  Weight: 117.2 kg (258 lb 6.1 oz) 110.1 kg (242 lb 11.6 oz)  113.7 kg (250 lb 10.6 oz)   General appearance: alert, cooperative, appears stated age and no distress Resp: decreased air entry at bases without definite crackles. No wheezing. Cardio: regular rate and rhythm, S1, S2 normal, no murmur, click, rub or gallop GI: Soft, significantly distended with ascites, NT. BS present. No masses or organomegaly. Male genitalia: edematous scrotum bilaterally. Extremities: edema bil LE is improving. Skin: old shallow ulcers seen bil LE covered with dressing Neurologic: No focal deficits.  Lab Results:  Basic Metabolic Panel:  Recent Labs Lab 12/10/13 1630 12/11/13 0535 12/12/13 0535 12/13/13 0519 12/15/13 0547  NA 130* 129* 128* 130* 127*  K 4.0 4.2 4.0 4.4 4.4  CL 94* 95* 94* 96 93*  CO2 25 26 27 26 26   GLUCOSE 111* 93 102* 125* 101*  BUN 19 19 20 22 20   CREATININE 0.99 1.01 1.02 0.98 0.89  CALCIUM 8.3* 8.1* 8.3* 8.7 8.6   Liver Function Tests:  Recent Labs Lab 12/10/13 1630 12/11/13 0535  AST 77* 71*  ALT 44 41  ALKPHOS 141* 119*  BILITOT 1.1 1.1  PROT 6.7 6.1  ALBUMIN 2.3* 2.2*    Recent Labs Lab 12/10/13 1930 12/14/13 1110  AMMONIA 42 77*   CBC:  Recent Labs Lab 12/10/13 1630 12/11/13 0535 12/12/13 0535  12/13/13 0519  WBC 6.3 5.3 5.5 7.9  NEUTROABS 3.8  --   --   --   HGB 9.8* 9.1* 9.5* 10.3*  HCT 28.5* 28.1* 29.1* 31.7*  MCV 91.6 93.7 93.6 94.3  PLT 97* 86* 87* 91*   Cardiac Enzymes:  Recent Labs Lab 12/10/13 1630 12/10/13 2005  TROPONINI <0.30 <0.30   BNP (last 3 results)  Recent Labs  12/10/13 1630  PROBNP 282.6*    Studies/Results: No results found.  Medications:  Scheduled: . feeding supplement (ENSURE COMPLETE)  237 mL Oral TID WC  . folic acid  1 mg Oral Daily  . furosemide  40 mg Intravenous Once  . furosemide  80 mg Oral BID  . lactulose  30 g Oral TID  . levofloxacin  500 mg Oral Daily  . multivitamin with minerals  1 tablet Oral Daily  . nicotine  14 mg Transdermal Daily  .  pantoprazole  40 mg Oral BID  . sodium chloride  3 mL Intravenous Q12H  . spironolactone  200 mg Oral Daily  . thiamine  100 mg Oral Daily   Continuous:  ZSW:FUXN & mag hydroxide-simeth, ondansetron (ZOFRAN) IV, ondansetron  Assessment/Plan:  Principal Problem:   Ascites Active Problems:   Smoking   Cirrhosis with alcoholism   Mild Coagulopathy   Acute respiratory failure   Esophageal varices with bleeding   Hepatitis C   Cellulitis   Acute CHF   HCAP (healthcare-associated pneumonia)    Ascites  He is S/p paracentesis on 2/11 and repeated on 2/13. Total 12 ltrs removed. Continue diuretics but changed to oral as he pulled out his foley 2/14. He is diuresing.   Decompensated Cirrhosis of liver and hepatic encephalopathy and Hyponatremia  Continue lactulose. INR is elevated. OP follow up with hepatology. He is diuresing well. ECHO done and reveals normal systolic function. Cardiac etiology is unlikely. Strict I/O. Daily weights. Monitor lytes. Ammonia level was slightly elevated 2/14. Lactulose dose was increased. Sodium levels low and fluctuating. His long term prognosis is poor.  Possible Healthcare associated pneumonia  Patient denies any episodes suggestive of aspiration. CXR suggested infiltrate. Patient does not have a cough per se. No blood cultures obtained. Now on oral Levaquin. DC on 2/20.  Chronic alcohol abuse  No withdrawal symptoms. Patient counseled to stop alcohol consumption. Can discontinue Tele.  Active smoker  Smoking cessation counseling was done. Nicotine patch at present   Thrombocytopenia and elevated INR  Due to hepatic cirrhosis. He also has history of esophageal varices. Stable.  History of esophageal varices  He is status post banding in January. Protonix twice a day   History of Schizophrenia Behavior is appropriate though he does seem not to understand the extent of his disease despite multiple discussions regarding same. Poor insight. Not on  any psychotropic medications at this time.  Code Status: Full Code.  DVT Prophylaxis: SCD's    Family Communication: Discussed with patient and his sister. Disposition Plan: Plan is for SNF whenever bed is available.     LOS: 6 days   [facility] Pager [phone] 12/16/2013, 8:38 AM  If 8PM-8AM, please contact night-coverage at [email or web address], password [facility]

## 2013-12-16 NOTE — Progress Notes (Addendum)
Physical Therapy Treatment Patient Details Name: Oscar Swanson MRN: [id number] DOB: 1961-01-17 Today's Date: 12/16/2013 Time: 1660-6004 PT Time Calculation (min): 30 min  PT Assessment / Plan / Recommendation  History of Present Illness 53 yo male admitted with massive ascites, SOB. Hx of cirrhosis, hep encephalopathy, schizonphrenia, DM. Lives alone in apt.    PT Comments   *Progressing well with mobility. Pt walked 330' with RW today. **  Follow Up Recommendations  SNF     Does the patient have the potential to tolerate intense rehabilitation     Barriers to Discharge        Equipment Recommendations  Rolling walker with 5" wheels    Recommendations for Other Services OT consult  Frequency Min 3X/week   Progress towards PT Goals Progress towards PT goals: Progressing toward goals  Plan Current plan remains appropriate    Precautions / Restrictions Precautions Precautions: Fall Restrictions Weight Bearing Restrictions: No   Pertinent Vitals/Pain *SaO2 95% on RA walking, HR 96, dyspnea 2/4**    Mobility  Bed Mobility Overal bed mobility: Needs Assistance Bed Mobility: Supine to Sit;Sit to Supine;Rolling;Sidelying to Sit Rolling: Min assist Sidelying to sit: Mod assist General bed mobility comments: Assist for bil LEs and trunk. Increased time.  Transfers Overall transfer level: Needs assistance Equipment used: Rolling walker (2 wheeled) Transfers: Sit to/from Stand Sit to Stand: Min assist;From elevated surface General transfer comment: assist to rise, stabilize, control descent. VCs safety, technique, hand placement Ambulation/Gait Ambulation Distance (Feet): 330 Feet Assistive device: Rolling walker (2 wheeled) Gait Pattern/deviations: Trunk flexed;Step-through pattern General Gait Details:  VCS safety, distance from walker. O2 sats 95% on RA, HR 96, dyspnea 2/4. 2 standing rest break.     Exercises     PT Diagnosis:    PT Problem List:   PT Treatment  Interventions:     PT Goals (current goals can now be found in the care plan section) Acute Rehab PT Goals Patient Stated Goal: to walk more  Visit Information  Last PT Received On: 12/16/13 Assistance Needed: +1 History of Present Illness: 53 yo male admitted with massive ascites, SOB. Hx of cirrhosis, hep encephalopathy, shizonphrenia, DM. Lives alone in apt.     Subjective Data  Patient Stated Goal: to walk more   Cognition  Cognition Arousal/Alertness: Awake/alert Behavior During Therapy: WFL for tasks assessed/performed Overall Cognitive Status: Within Functional Limits for tasks assessed    Balance     End of Session PT - End of Session Equipment Utilized During Treatment: Gait belt Activity Tolerance: Patient tolerated treatment well Patient left: with call bell/phone within reach;in chair Nurse Communication: Mobility status   GP     Oscar Swanson 12/16/2013, 12:31 PM [phone]

## 2013-12-16 NOTE — Progress Notes (Signed)
Clinical Social Work  CSW met with patient at bedside and explained barrier with finding SNF placement. Patient aware and still agreeable to placement but wants to be close to home if possible. Patient requested that CSW call sister and update her as well. CSW spoke with sister via phone who thanked CSW for time and will call with any additional questions.  CSW continues to search for placement. [facility] still have not made a decision. [facility] reports no Medicaid beds available. CSW left messages with [facility] and [facility]. CSW will continue to follow.  [place],  [phone]

## 2013-12-17 DIAGNOSIS — E871 Hypo-osmolality and hyponatremia: Secondary | ICD-10-CM

## 2013-12-17 LAB — BASIC METABOLIC PANEL
BUN: 16 mg/dL (ref 6–23)
CALCIUM: 7.7 mg/dL — AB (ref 8.4–10.5)
CO2: 27 mEq/L (ref 19–32)
CREATININE: 0.87 mg/dL (ref 0.50–1.35)
Chloride: 89 mEq/L — ABNORMAL LOW (ref 96–112)
Glucose, Bld: 106 mg/dL — ABNORMAL HIGH (ref 70–99)
Potassium: 4.3 mEq/L (ref 3.7–5.3)
Sodium: 122 mEq/L — ABNORMAL LOW (ref 137–147)

## 2013-12-17 NOTE — Progress Notes (Signed)
[facility] PROGRESS NOTE  Oscar Swanson SFK:[id number] DOB: [birth year] DOA: 12/10/2013  PCP: Angelica Chessman, MD  Brief HPI: Oscar Swanson is a 53 y.o. male with Past medical history of alcohol cirrhosis, hepatic encephalopathy, active smoker, esophageal varices, chronic thrombocytopenia. Patient presented with complaints of abdominal distention and shortness of breath. He was seen at GI clinic and was recommended for admission for tense ascites. He has bilateral leg swelling which worsened and was significantly red one week ago for which he was seen in the community clinic and was placed on oral Bactrim.   Past medical history:  Past Medical History  Diagnosis Date  . Diabetes mellitus without complication   . Schizophrenia     onset age 60.  in and out of psych facilities from age 36 to age 66.  s/p electroconvulsive therapy.  . TB (tuberculosis)     sister says this dx'd at age 33 and treated  . Hepatic encephalopathy   . Esophageal varices   . GI bleed   . Cirrhosis   . Thrombocytopenia   . Alcohol abuse     Consultants: None  Procedures: US guided Paracentesis 2/11 and 2/13  Antibiotics: IV Vanc 2/10-->2/12 IV Cefepime 2/10-->2/12 Levaquin 2/12-->  Subjective: Patient feels same. Still has significantly distended abdomen. Complains of stinging sensation in right leg.   Objective: Vital Signs  Filed Vitals:   12/16/13 2204 12/17/13 0001 12/17/13 0242 12/17/13 0614  BP: 112/56 113/60  113/66  Pulse: 83 84  79  Temp: 98.1 F (36.7 C)   98 F (36.7 C)  TempSrc: Oral   Oral  Resp: 20   20  Height:      Weight:   112.9 kg (248 lb 14.4 oz)   SpO2: 98%   98%    Intake/Output Summary (Last 24 hours) at 12/17/13 1036 Last data filed at 12/17/13 0616  Gross per 24 hour  Intake    960 ml  Output   1605 ml  Net   -645 ml   Filed Weights   12/15/13 0718 12/16/13 0745 12/17/13 0242  Weight: 110.1 kg (242 lb 11.6 oz) 113.7 kg (250 lb 10.6 oz)  112.9 kg (248 lb 14.4 oz)   General appearance: alert, cooperative, appears stated age and no distress Resp: decreased air entry at bases without definite crackles. No wheezing. Cardio: regular rate and rhythm, S1, S2 normal, no murmur, click, rub or gallop GI: Soft, significantly distended with ascites, NT. BS present. No masses or organomegaly. Male genitalia: edematous scrotum bilaterally. Extremities: edema bil LE is improving. Skin: old shallow ulcers seen bil LE covered with dressing Neurologic: No focal deficits.  Lab Results:  Basic Metabolic Panel:  Recent Labs Lab 12/11/13 0535 12/12/13 0535 12/13/13 0519 12/15/13 0547 12/17/13 0542  NA 129* 128* 130* 127* 122*  K 4.2 4.0 4.4 4.4 4.3  CL 95* 94* 96 93* 89*  CO2 26 27 26 26 27   GLUCOSE 93 102* 125* 101* 106*  BUN 19 20 22 20 16   CREATININE 1.01 1.02 0.98 0.89 0.87  CALCIUM 8.1* 8.3* 8.7 8.6 7.7*   Liver Function Tests:  Recent Labs Lab 12/10/13 1630 12/11/13 0535  AST 77* 71*  ALT 44 41  ALKPHOS 141* 119*  BILITOT 1.1 1.1  PROT 6.7 6.1  ALBUMIN 2.3* 2.2*    Recent Labs Lab 12/10/13 1930 12/14/13 1110  AMMONIA 42 77*   CBC:  Recent Labs Lab 12/10/13 1630 12/11/13 0535 12/12/13 0535 12/13/13 1749  WBC 6.3 5.3 5.5 7.9  NEUTROABS 3.8  --   --   --   HGB 9.8* 9.1* 9.5* 10.3*  HCT 28.5* 28.1* 29.1* 31.7*  MCV 91.6 93.7 93.6 94.3  PLT 97* 86* 87* 91*   Cardiac Enzymes:  Recent Labs Lab 12/10/13 1630 12/10/13 2005  TROPONINI <0.30 <0.30   BNP (last 3 results)  Recent Labs  12/10/13 1630  PROBNP 282.6*    Studies/Results: No results found.  Medications:  Scheduled: . feeding supplement (ENSURE COMPLETE)  237 mL Oral TID WC  . folic acid  1 mg Oral Daily  . furosemide  40 mg Intravenous Once  . furosemide  80 mg Oral BID  . lactulose  30 g Oral TID  . levofloxacin  500 mg Oral Daily  . multivitamin with minerals  1 tablet Oral Daily  . nicotine  14 mg Transdermal Daily  .  pantoprazole  40 mg Oral BID  . sodium chloride  3 mL Intravenous Q12H  . spironolactone  200 mg Oral Daily  . thiamine  100 mg Oral Daily   Continuous:  CHY:IFOY & mag hydroxide-simeth, ondansetron (ZOFRAN) IV, ondansetron, oxyCODONE  Assessment/Plan:  Principal Problem:   Ascites Active Problems:   Smoking   Cirrhosis with alcoholism   Mild Coagulopathy   Acute respiratory failure   Esophageal varices with bleeding   Hepatitis C   Cellulitis   Acute CHF   HCAP (healthcare-associated pneumonia)    Ascites  He is S/p paracentesis on 2/11 and repeated on 2/13. Total 12 ltrs removed. Continue diuretics but changed to oral as he pulled out his foley 2/14. He is diuresing.   Decompensated Cirrhosis of liver and hepatic encephalopathy and Hyponatremia  Continue lactulose. INR is elevated. OP follow up with hepatology. He is diuresing well. ECHO done and reveals normal systolic function. Cardiac etiology is unlikely. Strict I/O. Daily weights. Monitor lytes. Ammonia level was slightly elevated 2/14. Lactulose dose was increased. Sodium level has dropped significantly. Possibly due to volume overload despite diuretics. It may be worthwhile to get GI opinion with regards to long term prognosis which appears to be poor. Unfortunately I am unable to reach anyone with Dr. Eugenia Pancoast office, presumably due to snow/wintry conditions.  Possible Healthcare associated pneumonia  Patient denies any episodes suggestive of aspiration. CXR suggested infiltrate. Patient does not have a cough per se. No blood cultures obtained. Now on oral Levaquin. DC antibiotics on 2/20.  Chronic alcohol abuse  No withdrawal symptoms. Patient counseled to stop alcohol consumption.  Active smoker  Smoking cessation counseling was done. Nicotine patch at present   Thrombocytopenia and elevated INR  Due to hepatic cirrhosis. He also has history of esophageal varices. Stable.  History of esophageal varices  He is  status post banding in January. Protonix twice a day   History of Schizophrenia Behavior is appropriate though he does seem not to understand the extent of his disease despite multiple discussions regarding same. Poor insight. Not on any psychotropic medications at this time.  Code Status: Full Code.  DVT Prophylaxis: SCD's    Family Communication: Discussed with patient. Will call his sister. Disposition Plan: Plan is for SNF eventually. Will like to ensure sodium doesn't drop anymore. Will be unable to discharge today.    LOS: 7 days   [facility] Pager [phone] 12/17/2013, 10:36 AM  If 8PM-8AM, please contact night-coverage at [email or web address], password [facility]

## 2013-12-17 NOTE — Clinical Social Work Note (Signed)
[facility] declined patient, no male beds.  [facility] will not accept unless Medicaid can verify coverage, CSW and [facility] admissions have been unable to reach caseworker or supervisor at [employer] - [phone], Camelia Eng [phone]).  [facility], has questions about patient, CSW contacting RN and MD to address.  Still considering patient.  Edwyna Shell, LCSW Clinical Social Worker [phone])
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
## 2013-12-18 DIAGNOSIS — J9600 Acute respiratory failure, unspecified whether with hypoxia or hypercapnia: Secondary | ICD-10-CM

## 2013-12-18 LAB — BASIC METABOLIC PANEL
BUN: 17 mg/dL (ref 6–23)
CALCIUM: 8.2 mg/dL — AB (ref 8.4–10.5)
CO2: 25 mEq/L (ref 19–32)
CREATININE: 0.93 mg/dL (ref 0.50–1.35)
Chloride: 89 mEq/L — ABNORMAL LOW (ref 96–112)
GFR calc Af Amer: >90 mL/min (ref >90)
Glucose, Bld: 108 mg/dL — ABNORMAL HIGH (ref 70–99)
Potassium: 4.1 mEq/L (ref 3.7–5.3)
Sodium: 122 mEq/L — ABNORMAL LOW (ref 137–147)

## 2013-12-18 MED ORDER — LIDOCAINE 5 % EX PTCH
1.0000 | MEDICATED_PATCH | CUTANEOUS | Status: DC
  Administered 2013-12-18 – 2013-12-30 (×13): 1 via TRANSDERMAL
  Filled 2013-12-18 (×13): qty 1

## 2013-12-18 MED ORDER — SODIUM CHLORIDE 0.9 % IV SOLN
INTRAVENOUS | Status: DC
  Administered 2013-12-18: 50 mL via INTRAVENOUS

## 2013-12-18 MED ORDER — LIDOCAINE 5 % EX PTCH
1.0000 | MEDICATED_PATCH | Freq: Two times a day (BID) | CUTANEOUS | Status: DC
  Filled 2013-12-18 (×2): qty 1

## 2013-12-18 NOTE — Consult Note (Signed)
Consultation  Referring Provider: [facility] ( Dr. Olen Pel)  Primary Care Physician:  Angelica Chessman, MD Primary Gastroenterologist: Dr. Ardis Hughs         Reason for Consultation:  Cirrhosis / hyponatremia            HPI:   Oscar Swanson is a 53 y.o. male with a history of cirrhosis. He has a history of ETOH abuse, illicit drug use. Patient was seen by [place] in the hospital in January after he presented with hematemesis / tachycardia. EGD attempted but postponed for respiratory failure. Patient intubated and once stable underwent EGD which revealed varices / portal gastropathy. Patient had a follow up appointment in our office 12/10/13 af which time he presented with decompensated cirrhosis evidenced by worsening edema/ massive ascites. He was SOB. Patient was advised to go to ED for further evaluation and treatment. In ED he was found to have right sided infiltrate and was started on antibiotics. This admission he had u/s guided paracentesis with removal of 6 liters of fluid. Diuretics were increased. Echo done to rule out heart failure and revealed normal systolic function. He had a second LVP with removal of 6 liters 12/13/13. Serum sodium has been declining. He is on 160mg  lasix daily and 200mg  Aldactone. At home he doesn't add salt to food but doesn't really watch sodium intake other that.    Past Medical History  Diagnosis Date  . Diabetes mellitus without complication   . Schizophrenia     onset age 40.  in and out of psych facilities from age 38 to age 66.  s/p electroconvulsive therapy.  . TB (tuberculosis)     sister says this dx'd at age 89 and treated  . Hepatic encephalopathy   . Esophageal varices   . Cirrhosis   . Thrombocytopenia   . Alcohol abuse     Past Surgical History  Procedure Laterality Date  . Cataract surgery  Bilateral   . Esophagogastroduodenoscopy N/A 11/08/2013    Procedure: ESOPHAGOGASTRODUODENOSCOPY (EGD);  Surgeon: Milus Banister, MD;  Location:  [facility];  Service: Endoscopy;  Laterality: N/A;  bedside    Family History  Problem Relation Age of Onset  . Hypertension Mother   . Colon cancer Neg Hx   . Lung cancer Mother     smoker  . Colon polyps Sister     twin sister     History  Substance Use Topics  . Smoking status: Current Some Day Smoker -- 0.50 packs/day for 25 years    Types: Cigarettes  . Smokeless tobacco: Never Used     Comment: Tobacco information given 12/10/12  . Alcohol Use: Yes     Comment:  drink beer socially     Prior to Admission medications   Medication Sig Start Date End Date Taking? Authorizing Provider  beclomethasone (QVAR) 80 MCG/ACT inhaler Inhale 2 puffs into the lungs 2 (two) times daily.    Yes Historical Provider, MD  folic acid (FOLVITE) 1 MG tablet Take 1 tablet (1 mg total) by mouth daily. 12/02/13  Yes Angelica Chessman, MD  furosemide (LASIX) 40 MG tablet Take 1 tablet (40 mg total) by mouth 2 (two) times daily. 12/02/13  Yes Angelica Chessman, MD  lactulose (CHRONULAC) 10 GM/15ML solution Take 30 mLs (20 g total) by mouth 3 (three) times daily. 12/06/13  Yes Angelica Chessman, MD  Nutritional Supplements (ENSURE HIGH PROTEIN) LIQD Take 20 mls twice daily 12/06/13  Yes Angelica Chessman, MD  pantoprazole (  PROTONIX) 40 MG tablet Take 40 mg by mouth 2 (two) times daily.   Yes Historical Provider, MD  spironolactone (ALDACTONE) 100 MG tablet Take 2 tablets (200 mg total) by mouth daily. 12/10/13  Yes Milus Banister, MD  sulfamethoxazole-trimethoprim (BACTRIM DS) 800-160 MG per tablet Take 1 tablet by mouth 2 (two) times daily. 12/02/13  Yes Angelica Chessman, MD  thiamine (VITAMIN B-1) 100 MG tablet Take 1 tablet (100 mg total) by mouth daily. 12/02/13  Yes Angelica Chessman, MD    Current Facility-Administered Medications  Medication Dose Route Frequency Provider Last Rate Last Dose  . alum & mag hydroxide-simeth (MAALOX/MYLANTA) 200-200-20 MG/5ML suspension 15 mL  15 mL Oral Q4H PRN Bonnielee Haff, MD   15 mL at 12/17/13 2118  . feeding supplement (ENSURE COMPLETE) (ENSURE COMPLETE) liquid 237 mL  237 mL Oral TID WC Bonnielee Haff, MD   237 mL at 12/18/13 1305  . folic acid (FOLVITE) tablet 1 mg  1 mg Oral Daily Berle Mull, MD   1 mg at 12/18/13 [id number]  . furosemide (LASIX) injection 40 mg  40 mg Intravenous Once Berle Mull, MD      . furosemide (LASIX) tablet 80 mg  80 mg Oral BID Bonnielee Haff, MD   80 mg at 12/18/13 0801  . lactulose (CHRONULAC) 10 GM/15ML solution 30 g  30 g Oral TID Bonnielee Haff, MD   30 g at 12/17/13 2117  . levofloxacin (LEVAQUIN) tablet 500 mg  500 mg Oral Daily Bonnielee Haff, MD   500 mg at 12/18/13 0958  . lidocaine (LIDODERM) 5 % 1 patch  1 patch Transdermal Q24H Theodis Blaze, MD   1 patch at 12/18/13 1101  . multivitamin with minerals tablet 1 tablet  1 tablet Oral Daily Berle Mull, MD   1 tablet at 12/18/13 [phone]  . nicotine (NICODERM CQ - dosed in mg/24 hours) patch 14 mg  14 mg Transdermal Daily Berle Mull, MD   14 mg at 12/18/13 0959  . ondansetron (ZOFRAN) tablet 4 mg  4 mg Oral Q6H PRN Berle Mull, MD       Or  . ondansetron (ZOFRAN) injection 4 mg  4 mg Intravenous Q6H PRN Berle Mull, MD      . oxyCODONE (Oxy IR/ROXICODONE) immediate release tablet 5 mg  5 mg Oral Q6H PRN Bonnielee Haff, MD   5 mg at 12/18/13 0801  . pantoprazole (PROTONIX) EC tablet 40 mg  40 mg Oral BID Berle Mull, MD   40 mg at 12/18/13 0958  . sodium chloride 0.9 % injection 3 mL  3 mL Intravenous Q12H Berle Mull, MD   3 mL at 12/17/13 1020  . spironolactone (ALDACTONE) tablet 200 mg  200 mg Oral Daily Berle Mull, MD   200 mg at 12/18/13 0958  . thiamine (VITAMIN B-1) tablet 100 mg  100 mg Oral Daily Berle Mull, MD   100 mg at 12/18/13 [id number]    Allergies as of 12/10/2013 - Review Complete 12/10/2013  Allergen Reaction Noted  . Haldol [haloperidol]  11/21/2013   Review of Systems:    All systems reviewed and negative except where noted in HPI.    Physical Exam:  Vital signs in last 24 hours: Temp:  [98 F (36.7 C)-98.8 F (37.1 C)] 98.6 F (37 C) (02/18 1324) Pulse Rate:  [70-100] 70 (02/18 1324) Resp:  [16-18] 18 (02/18 1324) BP: (118-134)/(66-76) 118/66 mmHg (02/18 1324) SpO2:  [95 %-98 %] 98 % (02/18 1324) Weight:  [  248 lb 3.8 oz (112.6 kg)] 248 lb 3.8 oz (112.6 kg) (02/18 0537) Last BM Date: 12/17/13 General:   Pleasant white male in NAD Head:  Normocephalic and atraumatic. Eyes:   No icterus.   Conjunctiva pink. Ears:  Normal auditory acuity. Neck:  Supple; no masses felt Lungs:  Respirations even and unlabored. Lungs clear to auscultation bilaterally.   No wheezes, crackles, or rhonchi.  Heart:  Regular rate and rhythm Abdomen:  Soft, massively distended, nontender. Normal bowel sounds.Abdominal wall edema.  Rectal:  Not performed.  Msk:  Symmetrical without gross deformities.  Extremities:  Pitting edema of bilateral lower extremities.  Neurologic:  Alert and  oriented x4;  grossly normal neurologically. No asterixis Skin:  Intact without significant lesions or rashes. Cervical Nodes:  No significant cervical adenopathy. Psych:  Alert and cooperative. Normal affect.   Recent Labs  12/17/13 0542 12/18/13 0503  NA 122* 122*  K 4.3 4.1  CL 89* 89*  CO2 27 25  GLUCOSE 106* 108*  BUN 16 17  CREATININE 0.87 0.93  CALCIUM 7.7* 8.2*   INR 1.86 on 12/11/13.   PREVIOUS ENDOSCOPIES:       EGD 11/08/13 for GI bleeding There was old blood clot and hematin in the stomach. There were two  trunks of medium sized varices, no obvious recent site of bleeding.  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  The blood clot in proximal stomach precluded complete view of the  stomach and so perhaps there was offending site underlying. I  chose to band ligate the esopahgeal varices (5 bands placed in good  position). The examination was otherwise normal. Retroflexed  views revealed no abnormalities. The  scope was then withdrawn  from the patient and the procedure completed.  COMPLICATIONS: There were no complications.  ENDOSCOPIC IMPRESSION:  There was old blood clot and hematin in the stomach. There were two  trunks of medium sized varices, no obvious recent site of bleeding.  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  The blood clot in proximal stomach precluded complete view of the  stomach and so perhaps there was offending site underlying. I  chose to band ligate the esopahgeal varices (5 bands placed in good  position). The examination was otherwise normal.   Impression / Plan:   78. 53 year old male with decompensated cirrhosis. He has a history of ETOH abuse, last drink a few days prior to admission. His HCV antibody was positive in January.  Patient is -6100 cc fluid balance since admission. Weight down from admission by 9 pounds. He is s/p LVP x2 with 12 liters off total. No SBP. Continue low sodium diet. Hold diuretics. Check urine electrolytes. Recheck BMET in am.  Patient had GI bleed /variceal banding in early January. He will need follow up EGD, likely before hospital discharge.  2. HCV antibody positive. Will check HCV qual  Thanks   LOS: 8 days   Tye Savoy  12/18/2013, 3:22 PM  GI Attending Note   Chart was reviewed and patient was examined. X-rays and lab were reviewed.    I agree with management and plans.  At this point we need to hold diuretic therapy until serum sodium moves toward the normal range.  Once this is achieved diuretics can be restarted, outpatient at a lower dose, while gently given him normal saline.  He should have repeat endoscopy for surveillance of varices prior to discharge.  Sandy Salaam. Deatra Ina, M.D., [facility]  Cell [phone]

## 2013-12-18 NOTE — Progress Notes (Addendum)
Patient ID: ERICE AHLES, male   DOB: [birth year], 53 y.o.   MRN: [id number]  [facility] PROGRESS NOTE  DAEMYN GARIEPY IOE:[id number] DOB: 1960/11/27 DOA: 12/10/2013 PCP: Angelica Chessman, MD  Brief narrative: 53 y.o. male with Past medical history of alcohol cirrhosis, hepatic encephalopathy, active smoker, esophageal varices, chronic thrombocytopenia. Patient presented with complaints of abdominal distention and shortness of breath. He was seen at GI clinic and was recommended for admission for tense ascites. He has bilateral leg swelling which worsened and was significantly red one week ago for which he was seen in the community clinic and was placed on oral Bactrim.   Principal Problem:   Ascites secondary to history of alcohol abuse  - abdomen still distended - total of 12 liters of ascites removed (once on 2/11 and second on 2/13) - will need repeat paracentesis again in AM - continue Lasix, spironolactone  - also continue thiamine, folic acid, MVI daily  Active Problems:   Esophageal varices with bleeding - currently stable - continue Protonix for now   Acute hypoxic respiratory failure - secondary to PNA - continue Levaquin day #7/7, stop after today's dose     Hyponatremia - chronic and secondary to volume status, third spacing, use of Lasix  - overall stable over the past 48 hours  - BMP in AM   Severe malnutrition - secondary to progressive liver cirrhosis and FTT - continue 2 gram sodium diet    Hepatitis C - CMET overall stable   Anemia of chronic disease - no signs of active bleeding - CBC in AM   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   Objective: Filed Vitals:   12/17/13 2354 12/18/13 0535 12/18/13 0537 12/18/13 1324  BP: 134/73 128/70  118/66  Pulse: 79 100  70  Temp:  98 F (36.7 C)  98.6 F (37 C)  TempSrc:  Oral  Oral  Resp:  16  18  Height:      Weight:   112.6 kg (248 lb 3.8 oz)   SpO2:  96%  98%    Intake/Output Summary (Last 24 hours) at 12/18/13 1337 Last data filed at 12/18/13 1306  Gross per 24 hour  Intake   1000 ml  Output    975 ml  Net     25 ml    Exam:   General:  Pt is alert, follows commands appropriately, not in acute distress  Cardiovascular: Regular rate and rhythm, S1/S2, no murmurs, no rubs, no gallops  Respiratory: Clear to auscultation bilaterally, no wheezing, no crackles, no rhonchi  Abdomen: Distended and slightly tender in epigastric area, bowel sounds very soft, no guarding  Extremities: +2 bilateral LE pitting edema, pulses DP and PT palpable bilaterally  Data Reviewed: Basic Metabolic Panel:  Recent Labs Lab 12/12/13 0535 12/13/13 0519 12/15/13 0547 12/17/13 0542 12/18/13 0503  NA 128* 130* 127* 122* 122*  K 4.0 4.4 4.4 4.3 4.1  CL 94* 96 93* 89* 89*  CO2 27 26 26 27 25   GLUCOSE 102* 125* 101* 106* 108*  BUN 20 22 20 16 17   CREATININE 1.02 0.98 0.89 0.87 0.93  CALCIUM 8.3* 8.7 8.6 7.7* 8.2*    Recent Labs Lab 12/14/13 1110  AMMONIA 77*   CBC:  Recent Labs Lab 12/12/13 0535 12/13/13 0519  WBC 5.5 7.9  HGB 9.5* 10.3*  HCT 29.1* 31.7*  MCV 93.6 94.3  PLT 87* 91*   Recent Results (from the past 240 hour(s))  BODY FLUID CULTURE     Status: None   Collection Time    12/11/13 10:41 AM      Result Value Ref Range Status   Specimen Description PERITONEAL   Final   Special Requests NONE   Final   Gram Stain     Final   Value: NO WBC SEEN     NO ORGANISMS SEEN     Performed at [facility]    Culture     Final   Value: NO GROWTH 3 DAYS     Performed at [facility]   Report Status 12/14/2013 FINAL   Final     Scheduled Meds: . folic acid  1 mg Oral Daily  . furosemide  40 mg Intravenous Once  . furosemide  80 mg Oral BID  . lactulose  30 g Oral TID  . levofloxacin  500 mg Oral Daily  . lidocaine  1 patch Transdermal Q24H  . multivitamin   1 tablet Oral Daily  . nicotine  14 mg Transdermal Daily  . pantoprazole  40 mg Oral BID  . spironolactone  200 mg Oral Daily  . thiamine  100 mg Oral Daily   Continuous Infusions:  Faye Ramsay, MD  [facility] Pager [phone]  If 7PM-7AM, please contact night-coverage [email or web address] Password [email or web address] 12/18/2013, 1:37 PM   LOS: 8 days

## 2013-12-18 NOTE — Progress Notes (Addendum)
Clinical Social Work  Per MD, patient is not medically stable to DC today. CSW continues to work on placement. CSW spoke with [facility] who reports that they do not see a skilled need at this time. Per RN, patient has become weaker and PT is to come and re-evaluate. CSW will send updated PT notes to [facility]. CSW spoke with [facility] who reports she will review information to determine if they could accept at ALF. CSW called [employer] and [employer] who reports they will review information and call CSW back with determination.   CSW spoke with patient at bedside. Patient reports he does not want to go to [facility] but would consider ALF if in [place]. Patient reports he has been talking with sisters who also report that he could stay with them. Patient gave CSW permission to contact both sisters. CSW spoke with sisters via phone and spoke directly with [name]. [name] reports that patient could stay with her but uncertain if it would be a LT placement. CSW explained ALF option and explained that patient would need to be evaluated by state in order to receive pasarr number if ALF is chosen. Dtr and patient report understanding of process. CSW asked RN to get TB test ordered since ALF request that test.   As of right now, sister and patient report that patient will return home with sister and use ALF as alternative option. [name] reports she has medical experience and feels confident she can care for patient. If patient returns home he wants to use [facility] in order to get equipment such as [facility], hospital bed, and wheelchair. CSW made CM aware of possible HH needs but will continue to follow in order to assist with DC planning.  Per chart review, MD has made notes re: schizophrenia and alcohol use. Patient reports he was diagnosed with schizophrenia several years ago but does not have any formal follow up in the community and does not take any medication. CSW  offered community resources but patient is not interested. Patient reports he is aware of dangers of substance use and plans to quit "cold [place] so I can live longer." Patient also refuses any resources for SA.   [place], [place] [phone]

## 2013-12-18 NOTE — Progress Notes (Signed)
Physical Therapy Treatment Patient Details Name: Oscar Swanson MRN: [id number] DOB: 22-May-1961 Today's Date: 12/18/2013 Time: 4332-9518 PT Time Calculation (min): 36 min  PT Assessment / Plan / Recommendation  History of Present Illness 53 yo male admitted with massive ascites, SOB. Hx of cirrhosis, hep encephalopathy, shizonphrenia, DM. Lives alone in apt.    PT Comments   Pt OOB in recliner very willing to amb and motivated to "get better".  Assisted out of recliner required increased assist due to enlarged ABD.  Amb in hallway without any AD as prior.  VERY unsteady gait with MAX c/o B LE edma esp B feet in which increases difficulty of foot placement/stability.  HIGH FALL RISK.  Amb with RW pt still demon unsteady gait with wide BOS and difficulty clearing toes during swing phase of gait.  Also pt demon 3/4 DOE and limited activity tolerance.  Required a long rest break between walks. Pt will need ST Rehab at SNF to achieve safe level of mobility before returning to home.   Follow Up Recommendations  SNF     Does the patient have the potential to tolerate intense rehabilitation     Barriers to Discharge        Equipment Recommendations       Recommendations for Other Services    Frequency Min 3X/week   Progress towards PT Goals Progress towards PT goals: Progressing toward goals  Plan      Precautions / Restrictions Precautions Precautions: Fall Restrictions Weight Bearing Restrictions: No   Pertinent Vitals/Pain C/o MAX B LE heaviness and "numbness down right leg"    Mobility  Bed Mobility General bed mobility comments: Pt OOB in recliner Transfers Overall transfer level: Needs assistance Equipment used: Rolling walker (2 wheeled) Transfers: Sit to/from Stand Sit to Stand: Min assist General transfer comment: difficulty with sit to stand and stand to sit due to extended ABD area. Pt requires assist and increased time.   Ambulation/Gait Ambulation/Gait  assistance: Min assist;Mod assist Ambulation Distance (Feet): 110 Feet (55' x 2 sitting rest break) Assistive device: Rolling walker (2 wheeled);None Gait Pattern/deviations: Wide base of support;Decreased step length - left;Decreased step length - right;Decreased stance time - right Gait velocity: decreased General Gait Details: Very unsteady gait when amb pt w/o walker.  MAX c/o B LE edema legs and feet in which create difficulty with steppage/contact to floor.  Pt amb with a wider BOS in a woddle type gait.  Also demon limited activity tolerance with MOD c/o dyspnea and notable SOB.  RA avg 96% however HR increased to 122.  Pt required a long sitting rerst break.       PT Goals (current goals can now be found in the care plan section)    Visit Information  Last PT Received On: 12/18/13 Assistance Needed: +1 History of Present Illness: 53 yo male admitted with massive ascites, SOB. Hx of cirrhosis, hep encephalopathy, shizonphrenia, DM. Lives alone in apt.     Subjective Data      Cognition       Balance     End of Session PT - End of Session Equipment Utilized During Treatment: Gait belt Activity Tolerance: Patient limited by fatigue;Treatment limited secondary to medical complications (Comment) (B LE/foot edema) Patient left: in chair Nurse Communication: Mobility status   Rica Koyanagi  PTA [facility]  Acute  Rehab Pager      [phone]

## 2013-12-19 ENCOUNTER — Inpatient Hospital Stay (HOSPITAL_COMMUNITY): Payer: Medicaid Other

## 2013-12-19 LAB — CBC
HEMATOCRIT: 29.7 % — AB (ref 39.0–52.0)
HEMOGLOBIN: 9.8 g/dL — AB (ref 13.0–17.0)
MCH: 30.2 pg (ref 26.0–34.0)
MCHC: 33 g/dL (ref 30.0–36.0)
MCV: 91.4 fL (ref 78.0–100.0)
Platelets: 86 10*3/uL — ABNORMAL LOW (ref 150–400)
RBC: 3.25 MIL/uL — AB (ref 4.22–5.81)
RDW: 16.2 % — ABNORMAL HIGH (ref 11.5–15.5)
WBC: 6.8 10*3/uL (ref 4.0–10.5)

## 2013-12-19 LAB — BASIC METABOLIC PANEL
BUN: 17 mg/dL (ref 6–23)
CALCIUM: 8.2 mg/dL — AB (ref 8.4–10.5)
CO2: 28 meq/L (ref 19–32)
CREATININE: 0.94 mg/dL (ref 0.50–1.35)
Chloride: 91 mEq/L — ABNORMAL LOW (ref 96–112)
GFR calc Af Amer: >90 mL/min (ref >90)
GFR calc non Af Amer: >90 mL/min (ref >90)
GLUCOSE: 100 mg/dL — AB (ref 70–99)
Potassium: 4.4 mEq/L (ref 3.7–5.3)
SODIUM: 127 meq/L — AB (ref 137–147)

## 2013-12-19 LAB — NA AND K (SODIUM & POTASSIUM), RAND UR
Potassium Urine: 33 mEq/L
Sodium, Ur: 26 mEq/L

## 2013-12-19 LAB — AMMONIA: Ammonia: 28 umol/L (ref 11–60)

## 2013-12-19 MED ORDER — FUROSEMIDE 40 MG PO TABS
40.0000 mg | ORAL_TABLET | Freq: Two times a day (BID) | ORAL | Status: DC
  Administered 2013-12-19 – 2013-12-24 (×11): 40 mg via ORAL
  Filled 2013-12-19 (×13): qty 1

## 2013-12-19 MED ORDER — SPIRONOLACTONE 100 MG PO TABS
100.0000 mg | ORAL_TABLET | Freq: Every day | ORAL | Status: DC
  Administered 2013-12-19 – 2013-12-20 (×2): 100 mg via ORAL
  Filled 2013-12-19 (×2): qty 1

## 2013-12-19 NOTE — Progress Notes (Signed)
12/18/13 1300  OT Visit Information  Last OT Received On 12/18/13  Assistance Needed +1  History of Present Illness 53 yo male admitted with massive ascites, SOB. Hx of cirrhosis, hep encephalopathy, shizonphrenia, DM. Lives alone in apt.   OT Time Calculation  OT Start Time 1240  OT Stop Time 1307  OT Time Calculation (min) 27 min  Precautions  Precautions Fall  Restrictions  Weight Bearing Restrictions No  ADL  Grooming Wash/dry hands;Teeth care;Supervision/safety  Where Assessed - Grooming Supported standing  Lower Body Bathing Minimal assistance (with AE)  Where Assessed - Lower Body Bathing Unsupported sit to stand  Lower Body Dressing Minimal assistance (with AE)  Where Assessed - Lower Body Dressing Unsupported sit to stand  Toilet Transfer Min guard  Toilet Transfer Method Sit to stand  Toilet Transfer Equipment Comfort height toilet;Grab bars  Transfers/Ambulation Related to ADLs pt declined RW and walked to bathroom with min guard/close supervision.    ADL Comments Educated on and issued:  reacher, long sponge, sock aide.  Cues to use AE  Cognition  Arousal/Alertness Awake/alert  Behavior During Therapy [facility] for tasks assessed/performed  Overall Cognitive Status Within Functional Limits for tasks assessed  Bed Mobility  Sit to supine Min assist;HOB elevated  Transfers  Sit to Stand Min guard  General transfer comment for safety  OT - End of Session  Activity Tolerance Patient tolerated treatment well  Patient left in bed;with call bell/phone within reach;with bed alarm set  OT Assessment/Plan  OT Frequency Min 2X/week  Follow Up Recommendations SNF (unless pt has )  OT Equipment 3 in 1 bedside comode  OT Goal Progression  Progress towards OT goals Goals met and updated - see care plan  OT General Charges  $OT Visit 1 Procedure  OT Treatments  $Self Care/Home Management  23-37 mins  Lesle Chris, OTR/L [phone] 12/19/2013 filed this date for 12/18/13

## 2013-12-19 NOTE — Progress Notes (Signed)
Patient ID: BRITNEY CAPTAIN, male   DOB: 1961/03/27, 53 y.o.   MRN: [id number]  [facility] PROGRESS NOTE  RALPHEAL ZAPPONE MWN:[id number] DOB: 1961-09-16 DOA: 12/10/2013 PCP: Angelica Chessman, MD  Brief narrative:  53 y.o. male with Past medical history of alcohol cirrhosis, hepatic encephalopathy, active smoker, esophageal varices, chronic thrombocytopenia. Patient presented with complaints of abdominal distention and shortness of breath. He was seen at GI clinic and was recommended for admission for tense ascites. He has bilateral leg swelling which worsened and was significantly red one week ago for which he was seen in the community clinic and was placed on oral Bactrim.   Principal Problem:  Ascites secondary to history of alcohol abuse  - abdomen still distended, plan for paracentesis this AM  - total of 12 liters of ascites removed (once on 2/11 and second on 2/13)  - will need repeat paracentesis today  - continue Lasix, spironolactone  - also continue thiamine, folic acid, MVI daily  - appreciate GI input  Active Problems:  Esophageal varices with bleeding  - currently stable  - continue Protonix for now  - appreciate GI input  Acute hypoxic respiratory failure  - secondary to PNA - continue Levaquin day #7/7, stop today  Hyponatremia  - chronic and secondary to volume status, third spacing, use of Lasix  - overall stable and better over the past 24 hours 122 --> 127 this AM  - BMP in AM  Severe malnutrition  - secondary to progressive liver cirrhosis and FTT - continue 2 gram sodium diet  - nutrition consultations  Hepatitis C  - CMET overall stable  Anemia of chronic disease  - no signs of active bleeding  - CBC in AM  Thrombocytopenia  - secondary to alcohol induced bone marrow damage  - no signs of active bleeding  - repeat CBC in AM   Consultants:  IR for US guided paracentesis  GI  Procedures/Studies:  02/11 --> US guided paracentesis, 6 L  ascites removed  02/13 --> US guided paracentesis, 6 L ascites removed 02/19 --> US guided paracentesis Antibiotics:  Levaquin 2/12 --> 2/18   Code Status: Full  Family Communication: Pt at bedside  Disposition Plan: Home when medically stable, likely in AM  HPI/Subjective: No events overnight.   Objective: Filed Vitals:   12/19/13 0903 12/19/13 0907 12/19/13 0914 12/19/13 0951  BP: 112/59 113/56 113/53   Pulse:      Temp:      TempSrc:      Resp:      Height:      Weight:    107.8 kg (237 lb 10.5 oz)  SpO2:        Intake/Output Summary (Last 24 hours) at 12/19/13 1321 Last data filed at 12/19/13 1107  Gross per 24 hour  Intake 1053.33 ml  Output    975 ml  Net  78.33 ml    Exam:   General:  Pt is alert, follows commands appropriately, not in acute distress  Cardiovascular: Regular rate and rhythm, S1/S2, no murmurs, no rubs, no gallops  Respiratory: Clear to auscultation bilaterally, no wheezing, no crackles, no rhonchi  Abdomen: Soft, non tender, distended, bowel sounds present, no guarding  Extremities: +2 bilateral LE pitting edema, pulses DP and PT palpable bilaterally  Neuro: Grossly nonfocal  Data Reviewed: Basic Metabolic Panel:  Recent Labs Lab 12/13/13 0519 12/15/13 0547 12/17/13 0542 12/18/13 0503 12/19/13 0450  NA 130* 127* 122* 122* 127*  K 4.4 4.4  4.3 4.1 4.4  CL 96 93* 89* 89* 91*  CO2 26 26 27 25 28   GLUCOSE 125* 101* 106* 108* 100*  BUN 22 20 16 17 17   CREATININE 0.98 0.89 0.87 0.93 0.94  CALCIUM 8.7 8.6 7.7* 8.2* 8.2*    Recent Labs Lab 12/14/13 1110 12/19/13 0450  AMMONIA 77* 28   CBC:  Recent Labs Lab 12/13/13 0519 12/19/13 0450  WBC 7.9 6.8  HGB 10.3* 9.8*  HCT 31.7* 29.7*  MCV 94.3 91.4  PLT 91* 86*   Recent Results (from the past 240 hour(s))  BODY FLUID CULTURE     Status: None   Collection Time    12/11/13 10:41 AM      Result Value Ref Range Status   Specimen Description PERITONEAL   Final   Special  Requests NONE   Final   Gram Stain     Final   Value: NO WBC SEEN     NO ORGANISMS SEEN     Performed at [facility]   Culture     Final   Value: NO GROWTH 3 DAYS     Performed at [facility]   Report Status 12/14/2013 FINAL   Final     Scheduled Meds: . feeding supplement (ENSURE COMPLETE)  237 mL Oral TID WC  . folic acid  1 mg Oral Daily  . furosemide  40 mg Intravenous Once  . furosemide  40 mg Oral BID  . lactulose  30 g Oral TID  . levofloxacin  500 mg Oral Daily  . lidocaine  1 patch Transdermal Q24H  . multivitamin with minerals  1 tablet Oral Daily  . nicotine  14 mg Transdermal Daily  . pantoprazole  40 mg Oral BID  . sodium chloride  3 mL Intravenous Q12H  . spironolactone  100 mg Oral Daily  . thiamine  100 mg Oral Daily   Continuous Infusions:  Faye Ramsay, MD  [facility] Pager [phone]  If 7PM-7AM, please contact night-coverage [email or web address] Password [email or web address] 12/19/2013, 1:21 PM   LOS: 9 days

## 2013-12-19 NOTE — Procedures (Signed)
Successful US guided paracentesis from RLQ.  Yielded 6.2 liters of yellow/amber fluid.  No immediate complications.  Pt tolerated well.   Specimen was not sent for labs.  Tsosie Billing D PA-C 12/19/2013 9:26 AM

## 2013-12-19 NOTE — Progress Notes (Signed)
    Progress Note   Subjective  feels okay.    Objective   Vital signs in last 24 hours: Temp:  [97.8 F (36.6 C)-98.6 F (37 C)] 97.8 F (36.6 C) (02/19 0611) Pulse Rate:  [70-83] 79 (02/19 0611) Resp:  [18] 18 (02/19 0611) BP: (109-122)/(53-73) 113/53 mmHg (02/19 0914) SpO2:  [97 %-98 %] 97 % (02/19 0611) Weight:  [237 lb 10.5 oz (107.8 kg)-244 lb 11.4 oz (111 kg)] 237 lb 10.5 oz (107.8 kg) (02/19 0951) Last BM Date: 12/18/13 General:    white male in NAD Heart:  Regular rate and rhythm; no murmurs Lungs: Respirations even and unlabored, lungs CTA bilaterally Abdomen:  Soft, obese, nontender. Normal bowel sounds. Abdominal wall edema Extremities:  Bilateral lower extremity pitting edema. Neurologic:  Alert and oriented,  grossly normal neurologically. No asterixis Psych:  Cooperative. Normal mood and affect.  Lab Results:  Recent Labs  12/19/13 0450  WBC 6.8  HGB 9.8*  HCT 29.7*  PLT 86*   BMET  Recent Labs  12/17/13 0542 12/18/13 0503 12/19/13 0450  NA 122* 122* 127*  K 4.3 4.1 4.4  CL 89* 89* 91*  CO2 27 25 28   GLUCOSE 106* 108* 100*  BUN 16 17 17   CREATININE 0.87 0.93 0.94  CALCIUM 7.7* 8.2* 8.2*     Assessment / Plan:   Decompensated cirrhosis. MELD 14. S/p repeat LVP with removal of 6 liters today (this makes 16 liters removed this admission).  No focal liver lesions on CTscan to suggest [place]. Sodium 127 today, will restart diuretics at decreased dose. Continue low sodium diet. Thankfully renal function is okay. He will need dietary counseling regarding low sodium diet so he knows which foods to avoid at home (canned, frozen foods, etc...). ETOH avoidance is paramount. He hasn't followed strict low sodium diet nor been on maximum diuretic therapy yet but he fails these things then TIP may be next step.     LOS: 9 days   Tye Savoy  12/19/2013, 9:55 AM  GI Attending Note  I have personally taken an interval history, reviewed the chart, and  examined the patient.  I agree with the extender's note, impression and recommendations.  Sandy Salaam. Deatra Ina, MD, [facility] [phone]

## 2013-12-19 NOTE — Progress Notes (Signed)
Nutrition Education Note  RD consulted for nutrition education regarding low sodium diet therapy.  RD provided "Low Sodium Nutrition Therapy" handout from the Academy of [employer] Dietetics. Reviewed patient's dietary recall. Provided examples on ways to decrease sodium intake in diet. Discouraged intake of processed foods and use of salt shaker. Encouraged fresh fruits and vegetables.  RD discussed why it is important for patient to adhere to diet recommendations, and emphasized the role of fluid retention with excess sodium intake. Teach back method used.  Expect good compliance. Pt expressed desire to follow diet strictly and take care of his health. Pt admits to eating a high salt diet PTA.   Body mass index is 35.61 kg/(m^2).  *not accurate with significant ascites (12L fluid removed since admission)  Current diet order is 2 gram sodium, patient is consuming approximately 100% of meals at this time. Labs and medications reviewed. No further nutrition interventions warranted at this time. RD contact information provided. If additional nutrition issues arise, please re-consult RD.   Mikey College MS, [place], [place] Pager [phone] After Hours Pager

## 2013-12-20 LAB — CBC
HCT: 32.5 % — ABNORMAL LOW (ref 39.0–52.0)
Hemoglobin: 10.6 g/dL — ABNORMAL LOW (ref 13.0–17.0)
MCH: 29.8 pg (ref 26.0–34.0)
MCHC: 32.6 g/dL (ref 30.0–36.0)
MCV: 91.3 fL (ref 78.0–100.0)
PLATELETS: 96 10*3/uL — AB (ref 150–400)
RBC: 3.56 MIL/uL — AB (ref 4.22–5.81)
RDW: 16.1 % — AB (ref 11.5–15.5)
WBC: 5.4 10*3/uL (ref 4.0–10.5)

## 2013-12-20 LAB — BASIC METABOLIC PANEL
BUN: 14 mg/dL (ref 6–23)
CALCIUM: 8.4 mg/dL (ref 8.4–10.5)
CHLORIDE: 94 meq/L — AB (ref 96–112)
CO2: 27 meq/L (ref 19–32)
CREATININE: 0.85 mg/dL (ref 0.50–1.35)
GFR calc Af Amer: >90 mL/min (ref >90)
Glucose, Bld: 118 mg/dL — ABNORMAL HIGH (ref 70–99)
POTASSIUM: 4.5 meq/L (ref 3.7–5.3)
Sodium: 127 mEq/L — ABNORMAL LOW (ref 137–147)

## 2013-12-20 MED ORDER — SPIRONOLACTONE 100 MG PO TABS
100.0000 mg | ORAL_TABLET | Freq: Two times a day (BID) | ORAL | Status: DC
  Administered 2013-12-20 – 2013-12-24 (×8): 100 mg via ORAL
  Filled 2013-12-20 (×10): qty 1

## 2013-12-20 NOTE — Progress Notes (Signed)
Progress Note   Subjective  feels okay   Objective   Vital signs in last 24 hours: Temp:  [97.7 F (36.5 C)-98.2 F (36.8 C)] 98.2 F (36.8 C) (02/19 2123) Pulse Rate:  [84-86] 86 (02/19 2123) Resp:  [18] 18 (02/19 2123) BP: (116-125)/(52-76) 125/76 mmHg (02/19 2123) SpO2:  [98 %-99 %] 98 % (02/19 2123) Weight:  [231 lb 4.2 oz (104.9 kg)-237 lb 10.5 oz (107.8 kg)] 231 lb 4.2 oz (104.9 kg) (02/20 0704) Last BM Date: 12/19/13 General:    White male in NAD Heart:  Regular rate and rhythm Lungs: Respirations even and unlabored, lungs CTA bilaterally Abdomen:  Soft, moderately distended, nontender, normal bowel sounds. Extremities:  Bilateral LE pitting edema.  Neurologic:  Alert and oriented,  grossly normal neurologically. No asterixis Psych:  Cooperative. Normal mood and affect.  Lab Results:  Recent Labs  12/19/13 0450 12/20/13 0510  WBC 6.8 5.4  HGB 9.8* 10.6*  HCT 29.7* 32.5*  PLT 86* 96*   BMET  Recent Labs  12/18/13 0503 12/19/13 0450 12/20/13 0510  NA 122* 127* 127*  K 4.1 4.4 4.5  CL 89* 91* 94*  CO2 _0 GLUCOSE 108* 100* 118*  BUN _1 CREATININE 0.93 0.94 0.85  CALCIUM 8.2* 8.2* 8.4   Studies/Results: US Paracentesis  12/20/2013   CLINICAL DATA:  Cirrhosis, recurrent ascites, request for therapeutic paracentesis.  EXAM: ULTRASOUND GUIDED therapeutic PARACENTESIS  COMPARISON:  None.  PROCEDURE: An ultrasound guided paracentesis was thoroughly discussed with the patient and questions answered. The benefits, risks, alternatives and complications were also discussed. The patient understands and wishes to proceed with the procedure. Written consent was obtained.  Ultrasound was performed to localize and mark an adequate pocket of fluid in the right lower quadrant of the abdomen. The area was then prepped and draped in the normal sterile fashion. 1% Lidocaine was used for local anesthesia. Under ultrasound guidance a 19 gauge Yueh catheter was  introduced. Paracentesis was performed. The catheter was removed and a dressing applied.  Complications: None.  FINDINGS: A total of approximately 6.2 liters of yellow/amber colored fluid was removed. A fluid sample was not sent for laboratory analysis.  IMPRESSION: Successful ultrasound guided paracentesis yielding 6.2 liters of ascites.  Read By:  Tsosie Billing PA-C   Electronically Signed   By: Aletta Edouard M.D.   On: 12/20/2013 07:29     Assessment / Plan:   1. Decompensated cirrhosis. MELD 14. S/p repeat LVP with removal of 6 liters yesterday (this makes 18 liters removed this admission). No focal liver lesions on CTscan to suggest [place]. Sodium holding at 127 today, diuretics restarted at lower dose yesterday.Thankfully renal function is okay.  Weight continues to decline. Met with Dietician yesterday, instructed on low sodium diet.   Continue daily weights upon discharge  Continue low sodium diet.  Appreciate Dietician's help with patient education regarding low sodium diet.  ETOH avoidance is paramount.   He hasn't followed strict low sodium diet nor been on maximum diuretic therapy but he fails these things then a TIPs may be next step. Will see him in office next Thursday at 2pm   Continue Lactulose at home  2. Positive HCV antibody. Viral load pending.    LOS: 10 days   Tye Savoy  12/20/2013, 9:27 AM  GI Attending Note  I have personally taken an interval history, reviewed the chart, and examined the patient.   With stabilization of sodium we  can resume diuretics.  Let's increase Aldactone to 100 mg twice a day while maintaining Lasix 40 mg twice a day.  I would defer discharge until we see that he is having some net diuresis.  Sandy Salaam. Deatra Ina, MD, [facility] [phone]

## 2013-12-20 NOTE — Progress Notes (Signed)
Clinical Social Work  CSW met with patient at bedside. Patient reports he had questions re: food stamps because he did not complete renewal forms. Patient reports that he had renewal forms but lost them and requested additional forms and lost those as well. Patient was supposed to renew in January but still has not completed paperwork. CSW explained that patient would need to talk with Department of Social Services in order to determine if he has to complete a new application or if worker can assist with renewal. Patient agreeable and reports that sister can assist and he is familiar with Department of Social Services.  CSW and patient discussed DC plans. Patient continues to refuse SNF placement and wants to return home with sister. CSW explained ALF options and explained that patient is not eligible at [facility] due to age 23 but that [facility] was agreeable to accept patient once pasarr received. [facility] has not evaluated patient face to face but all clincals faxed for review. Patient understanding if [facility] does not evaluate him in the hospital then they can evaluate him at home. Patient reports sister is ready to accept him and just waiting on equipment.   CSW will continue to assist with pasarr and will have ALF as alternative option.   [place], [place] [phone]

## 2013-12-20 NOTE — Progress Notes (Addendum)
Patient ID: Oscar Swanson, male   DOB: August 27, 1961, 53 y.o.   MRN: [id number]  [facility] PROGRESS NOTE  Oscar Swanson:[id number] DOB: 1961/10/28 DOA: 12/10/2013 PCP: Angelica Chessman, MD  Brief narrative:  52 y.o. male with Past medical history of alcohol cirrhosis, hepatic encephalopathy, active smoker, esophageal varices, chronic thrombocytopenia. Patient presented with complaints of abdominal distention and shortness of breath. He was seen at GI clinic and was recommended for admission for tense ascites. He has bilateral leg swelling which worsened and was significantly red one week ago for which he was seen in the community clinic and was placed on oral Bactrim.   Principal Problem:  Ascites secondary to history of alcohol abuse  - abdomen still distended, last paracentesis 2/19 and 6 L fluid removed - total of 18 liters of ascites removed (once on 2/11, second on 2/13, third 2/19)  - ? Need for TIPS, follow up on GI recommendations  - continue Lasix, spironolactone  - also continue thiamine, folic acid, MVI daily  - appreciate GI input  Active Problems:  Esophageal varices with bleeding  - currently stable  - continue Protonix for now  - appreciate GI input  Acute hypoxic respiratory failure  - secondary to PNA - continue Levaquin day #7/7, stop today  Hyponatremia  - chronic and secondary to volume status, third spacing, use of Lasix  - overall stable and better over the past 24 hours 122 --> 127 over the past 24 hours  - BMP in AM  Severe malnutrition  - secondary to progressive liver cirrhosis and FTT - continue 2 gram sodium diet  - nutrition consultations appreciated  Hepatitis C  - CMET overall stable  Anemia of chronic disease  - no signs of active bleeding  Thrombocytopenia  - secondary to alcohol induced bone marrow damage  - no signs of active bleeding   Consultants:  IR for US guided paracentesis  GI  Procedures/Studies:  02/11 --> US  guided paracentesis, 6 L ascites removed  02/13 --> US guided paracentesis, 6 L ascites removed  02/19 --> US guided paracentesis, 6 L ascites removed  Antibiotics:  Levaquin 2/12 --> 2/18   Code Status: Full  Family Communication: Pt at bedside  Disposition Plan: Home when medically stable, when GI clears, likely in AM   HPI/Subjective: No events overnight.   Objective: Filed Vitals:   12/19/13 0951 12/19/13 1401 12/19/13 2123 12/20/13 0704  BP:  116/52 125/76   Pulse:  84 86   Temp:  97.7 F (36.5 C) 98.2 F (36.8 C)   TempSrc:  Oral Oral   Resp:  18 18   Height:      Weight: 107.8 kg (237 lb 10.5 oz)   104.9 kg (231 lb 4.2 oz)  SpO2:  99% 98%     Intake/Output Summary (Last 24 hours) at 12/20/13 1239 Last data filed at 12/19/13 2102  Gross per 24 hour  Intake    243 ml  Output    101 ml  Net    142 ml    Exam:   General:  Pt is alert, follows commands appropriately, not in acute distress  Cardiovascular: Regular rate and rhythm, S1/S2, no murmurs, no rubs, no gallops  Respiratory: Clear to auscultation bilaterally, no wheezing, diminished breath sounds at bases   Abdomen: Soft, non tender, distended, bowel sounds present, no guarding  Extremities: upper and lower extremity edema, pulses DP and PT palpable bilaterally  Neuro: Grossly nonfocal  Data Reviewed:  Basic Metabolic Panel:  Recent Labs Lab 12/15/13 0547 12/17/13 0542 12/18/13 0503 12/19/13 0450 12/20/13 0510  NA 127* 122* 122* 127* 127*  K 4.4 4.3 4.1 4.4 4.5  CL 93* 89* 89* 91* 94*  CO2 26 27 25 28 27   GLUCOSE 101* 106* 108* 100* 118*  BUN 20 16 17 17 14   CREATININE 0.89 0.87 0.93 0.94 0.85  CALCIUM 8.6 7.7* 8.2* 8.2* 8.4    Recent Labs Lab 12/14/13 1110 12/19/13 0450  AMMONIA 77* 28   CBC:  Recent Labs Lab 12/19/13 0450 12/20/13 0510  WBC 6.8 5.4  HGB 9.8* 10.6*  HCT 29.7* 32.5*  MCV 91.4 91.3  PLT 86* 96*    Recent Results (from the past 240 hour(s))  BODY FLUID  CULTURE     Status: None   Collection Time    12/11/13 10:41 AM      Result Value Ref Range Status   Specimen Description PERITONEAL   Final   Special Requests NONE   Final   Gram Stain     Final   Value: NO WBC SEEN     NO ORGANISMS SEEN     Performed at [facility]   Culture     Final   Value: NO GROWTH 3 DAYS     Performed at [facility]   Report Status 12/14/2013 FINAL   Final     Scheduled Meds: . feeding supplement (ENSURE COMPLETE)  237 mL Oral TID WC  . folic acid  1 mg Oral Daily  . furosemide  40 mg Oral BID  . lactulose  30 g Oral TID  . lidocaine  1 patch Transdermal Q24H  . multivitamin with minerals  1 tablet Oral Daily  . nicotine  14 mg Transdermal Daily  . pantoprazole  40 mg Oral BID  . sodium chloride  3 mL Intravenous Q12H  . spironolactone  100 mg Oral BID  . thiamine  100 mg Oral Daily   Continuous Infusions:  Faye Ramsay, MD  [facility] Pager [phone]  If 7PM-7AM, please contact night-coverage [email or web address] Password [facility] 12/20/2013, 12:39 PM   LOS: 10 days

## 2013-12-20 NOTE — Progress Notes (Signed)
PT Cancellation Note  ___Treatment cancelled today due to medical issues with patient which prohibited therapy  ___ Treatment cancelled today due to patient receiving procedure or test   ___ Treatment cancelled today due to patient's refusal to participate   _X_ Treatment cancelled today due to pt's decline x 2 attempts.  Nursing staff reports pt does well getting OOB to BR with assist.  Rica Koyanagi  PTA [facility]  Acute  Rehab Pager      [phone]

## 2013-12-20 NOTE — Progress Notes (Signed)
OT Cancellation Note  Patient Details Name: VEASNA SANTIBANEZ MRN: [id number] DOB: 1961/02/26   Cancelled Treatment:    Reason Eval/Treat Not Completed: Other (comment).  Pt is uncomfortable with swelling in abdomen.  Will check back Monday as schedule permits.   Prachi Oftedahl 12/20/2013, 9:43 AM Lesle Chris, OTR/L [phone] 12/20/2013

## 2013-12-21 ENCOUNTER — Inpatient Hospital Stay (HOSPITAL_COMMUNITY): Payer: Medicaid Other

## 2013-12-21 LAB — BASIC METABOLIC PANEL
BUN: 15 mg/dL (ref 6–23)
CALCIUM: 8.1 mg/dL — AB (ref 8.4–10.5)
CO2: 27 meq/L (ref 19–32)
CREATININE: 0.96 mg/dL (ref 0.50–1.35)
Chloride: 95 mEq/L — ABNORMAL LOW (ref 96–112)
GFR calc non Af Amer: >90 mL/min (ref >90)
Glucose, Bld: 108 mg/dL — ABNORMAL HIGH (ref 70–99)
Potassium: 4.4 mEq/L (ref 3.7–5.3)
SODIUM: 129 meq/L — AB (ref 137–147)

## 2013-12-21 LAB — HCV RNA QUANT
HCV Quantitative Log: 5.19 {Log} — ABNORMAL HIGH (ref <1.18)
HCV Quantitative: 154290 IU/mL — ABNORMAL HIGH (ref <15)

## 2013-12-21 MED ORDER — ALBUMIN HUMAN 25 % IV SOLN
16.0000 g | Freq: Once | INTRAVENOUS | Status: AC
  Administered 2013-12-21: 16 g via INTRAVENOUS
  Filled 2013-12-21: qty 80

## 2013-12-21 NOTE — Progress Notes (Signed)
Patient ID: Oscar Swanson, male   DOB: 1961/07/15, 53 y.o.   MRN: [id number]  [facility] PROGRESS NOTE  DAYNE CHAIT PPJ:[id number] DOB: 1961-03-27 DOA: 12/10/2013 PCP: Angelica Chessman, MD  Brief narrative:  53 y.o. male with Past medical history of alcohol cirrhosis, hepatic encephalopathy, active smoker, esophageal varices, chronic thrombocytopenia. Patient presented with complaints of abdominal distention and shortness of breath. He was seen at GI clinic and was recommended for admission for tense ascites. He has bilateral leg swelling which worsened and was significantly red one week ago for which he was seen in the community clinic and was placed on oral Bactrim.   Principal Problem:  Ascites secondary to history of alcohol abuse  - abdomen still distended, last paracentesis 2/19 and 6 L fluid removed  - total of 18 liters of ascites removed (once on 2/11, second on 2/13, third 2/19)  - plan for large volume paracentesis today  - ? Need for TIPS, follow up on GI recommendations  - continue Lasix, spironolactone  - also continue thiamine, folic acid, MVI daily  - appreciate GI input  Active Problems:  Esophageal varices with bleeding  - currently stable  - continue Protonix for now  - appreciate GI input  Acute hypoxic respiratory failure  - secondary to PNA - completed therapy with Levaquin for 7 days  - shortness of breath at this point secondary to severe ascites and resulting abdominal distension  Hyponatremia  - chronic and secondary to volume status, third spacing, use of Lasix  - overall stable and better over the past 24 hours 122 --> 127 --> 129 over the past 24 hours  - BMP in AM  Severe malnutrition  - secondary to progressive liver cirrhosis and FTT - continue 2 gram sodium diet  - nutrition consultations appreciated  Hepatitis C  - CMET overall stable  Anemia of chronic disease  - no signs of active bleeding  Thrombocytopenia  - secondary to  alcohol induced bone marrow damage  - no signs of active bleeding   Consultants:  IR for US guided paracentesis  GI  Procedures/Studies:  02/11 --> US guided paracentesis, 6 L ascites removed  02/13 --> US guided paracentesis, 6 L ascites removed  02/19 --> US guided paracentesis, 6 L ascites removed  Antibiotics:  Levaquin 2/12 --> 2/18   Code Status: Full  Family Communication: Pt at bedside  Disposition Plan: Home when medically stable, note ready yet for discharge   HPI/Subjective: No events overnight.   Objective: Filed Vitals:   12/21/13 1423 12/21/13 1428 12/21/13 1433 12/21/13 1440  BP: 112/59 106/57 108/54 108/59  Pulse:      Temp:      TempSrc:      Resp:      Height:      Weight:      SpO2:        Intake/Output Summary (Last 24 hours) at 12/21/13 1511 Last data filed at 12/21/13 0540  Gross per 24 hour  Intake    360 ml  Output    600 ml  Net   -240 ml    Exam:   General:  Pt is alert, follows commands appropriately, not in acute distress, diffuse anasarca   Cardiovascular: Regular rate and rhythm, S1/S2, no murmurs, no rubs, no gallops  Respiratory: Diminished breath sounds bilaterally   Abdomen: Soft, distended with fluid wave, bowel sounds present  Extremities: upper and lower extremity edema, pulses DP and PT palpable bilaterally  Neuro:  Grossly nonfocal  Data Reviewed: Basic Metabolic Panel:  Recent Labs Lab 12/17/13 0542 12/18/13 0503 12/19/13 0450 12/20/13 0510 12/21/13 0523  NA 122* 122* 127* 127* 129*  K 4.3 4.1 4.4 4.5 4.4  CL 89* 89* 91* 94* 95*  CO2 27 25 28 27 27   GLUCOSE 106* 108* 100* 118* 108*  BUN 16 17 17 14 15   CREATININE 0.87 0.93 0.94 0.85 0.96  CALCIUM 7.7* 8.2* 8.2* 8.4 8.1*    Recent Labs Lab 12/19/13 0450  AMMONIA 28   CBC:  Recent Labs Lab 12/19/13 0450 12/20/13 0510  WBC 6.8 5.4  HGB 9.8* 10.6*  HCT 29.7* 32.5*  MCV 91.4 91.3  PLT 86* 96*   Scheduled Meds: . albumin human  16 g  Intravenous Once  . feeding supplement (ENSURE COMPLETE)  237 mL Oral TID WC  . folic acid  1 mg Oral Daily  . furosemide  40 mg Oral BID  . lactulose  30 g Oral TID  . lidocaine  1 patch Transdermal Q24H  . multivitamin with minerals  1 tablet Oral Daily  . nicotine  14 mg Transdermal Daily  . pantoprazole  40 mg Oral BID  . sodium chloride  3 mL Intravenous Q12H  . spironolactone  100 mg Oral BID  . thiamine  100 mg Oral Daily   Continuous Infusions:  Faye Ramsay, MD  [facility] Pager [phone]  If 7PM-7AM, please contact night-coverage [email or web address] Password [email or web address] 12/21/2013, 3:11 PM   LOS: 11 days

## 2013-12-21 NOTE — Procedures (Signed)
US guided therapeutic paracentesis performed yielding 6  liters turbid, amber fluid. No immediate complications.

## 2013-12-21 NOTE — Progress Notes (Signed)
    Progress Note   Subjective  no complaints today.    Objective   Vital signs in last 24 hours: Temp:  [98 F (36.7 C)-98.7 F (37.1 C)] 98.2 F (36.8 C) (02/21 0540) Pulse Rate:  [77-83] 83 (02/21 0540) Resp:  [16-18] 18 (02/21 0540) BP: (120-126)/(69-79) 123/69 mmHg (02/21 0540) SpO2:  [96 %-99 %] 96 % (02/21 0540) Weight:  [232 lb 5.8 oz (105.4 kg)] 232 lb 5.8 oz (105.4 kg) (02/21 0540) Last BM Date: 12/20/13 General:    white male in NAD Heart:  Regular rate and rhythm Lungs: Respirations even and unlabored, lungs CTA bilaterally Abdomen:  Soft, markedly distended, nontender, normal bowel sounds. Extremities:  Bilateral lower extremity edema Neurologic:  Alert and oriented,  grossly normal neurologically. Psych:  Cooperative. Normal mood and affect.    Lab Results:  Recent Labs  12/19/13 0450 12/20/13 0510  WBC 6.8 5.4  HGB 9.8* 10.6*  HCT 29.7* 32.5*  PLT 86* 96*   BMET  Recent Labs  12/19/13 0450 12/20/13 0510 12/21/13 0523  NA 127* 127* 129*  K 4.4 4.5 4.4  CL 91* 94* 95*  CO2 28 27 27   GLUCOSE 100* 118* 108*  BUN 17 14 15   CREATININE 0.94 0.85 0.96  CALCIUM 8.2* 8.4 8.1*      Assessment / Plan:   8.  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 Still +net fluid balance (+360 yesterday). Weight up a pound from yesterday. He is more short of breath this am, can hardly move about in bed without labored breathing. Urine output only 600 cc yesterday. We increased aldactone from 100mg  to 200mg  yesterday. Lasix remains at 40mg  BID. He needs therapeutic paracentesis for SOB, will order and give 8gm albumin for every liter removed after 4th liter. Check am BMET, if sodium and renal function holding then will increase lasix dose. Continue low sodium diet. If can't tolerate diuretic increase or refractory to diuretics will need TIPS  2. HCV / ETOH cirrhosis, positive viral load.    LOS: 11 days   Tye Savoy  12/21/2013, 8:59 AM  GI Attending  Note  I have personally taken an interval history, reviewed the chart, and examined the patient. He clearly is more dyspneic, presumably due to reaccumulating ascites.  Agree with plan for large volume paracentesis today.  Repeat CxR as well. It is worrisome that he's reaccumulating ascites so rapidly in the face of high dose diuretic Rx.  Should he not begin to diurese in the next 48 hours I would have IR evaluate him for TIPS placement.  Will check alpha feto protein. Sandy Salaam. Deatra Ina, MD, [facility] [phone]   ]

## 2013-12-22 DIAGNOSIS — K7460 Unspecified cirrhosis of liver: Secondary | ICD-10-CM

## 2013-12-22 LAB — BASIC METABOLIC PANEL
BUN: 14 mg/dL (ref 6–23)
CO2: 25 mEq/L (ref 19–32)
Calcium: 8 mg/dL — ABNORMAL LOW (ref 8.4–10.5)
Chloride: 97 mEq/L (ref 96–112)
Creatinine, Ser: 0.82 mg/dL (ref 0.50–1.35)
Glucose, Bld: 108 mg/dL — ABNORMAL HIGH (ref 70–99)
POTASSIUM: 4.1 meq/L (ref 3.7–5.3)
Sodium: 129 mEq/L — ABNORMAL LOW (ref 137–147)

## 2013-12-22 MED ORDER — LORAZEPAM 0.5 MG PO TABS
0.5000 mg | ORAL_TABLET | Freq: Every evening | ORAL | Status: DC | PRN
  Administered 2013-12-22 – 2013-12-29 (×9): 0.5 mg via ORAL
  Filled 2013-12-22 (×9): qty 1

## 2013-12-22 MED ORDER — LORAZEPAM 0.5 MG PO TABS: 0.5000 mg | ORAL_TABLET | Freq: Every evening | ORAL | Status: DC | PRN

## 2013-12-22 NOTE — Progress Notes (Signed)
Patient ID: Oscar Swanson, male   DOB: 10-30-1961, 53 y.o.   MRN: [id number]  [facility] PROGRESS NOTE  LAURICE KIMMONS UYQ:[id number] DOB: 05-Feb-1961 DOA: 12/10/2013 PCP: Angelica Chessman, MD  Brief narrative:  53 y.o. male with Past medical history of alcohol cirrhosis, hepatic encephalopathy, active smoker, esophageal varices, chronic thrombocytopenia. Patient presented with complaints of abdominal distention and shortness of breath. He was seen at GI clinic and was recommended for admission for tense ascites. He has bilateral leg swelling which worsened and was significantly red one week ago for which he was seen in the community clinic and was placed on oral Bactrim.   Principal Problem:  Ascites secondary to history of alcohol abuse  - abdomen still distended, last paracentesis 2/19 and 6 L fluid removed  - total of 18 liters of ascites removed (once on 2/11, second on 2/13, third 2/19, fourth on 2/21)  -  GI arranging for TIPS by IR - continue Lasix, spironolactone  - also continue thiamine, folic acid, MVI daily  - appreciate GI input  Active Problems:  Esophageal varices with bleeding  - currently stable  - continue Protonix for now  - appreciate GI input  Acute hypoxic respiratory failure  - secondary to PNA - completed therapy with Levaquin for 7 days  - shortness of breath at this point secondary to severe ascites and resulting abdominal distension  Hyponatremia due to cirrhosis and improved with diuresis and paracentesis - chronic and secondary to volume status, third spacing, use of Lasix  - overall stable and better over the past 24 hours 122 --> 127 --> 129 over the past 24 hours  - BMP in AM  Severe malnutrition  - secondary to progressive liver cirrhosis and FTT - continue 2 gram sodium diet  - nutrition consultations appreciated  Hepatitis C  - CMET overall stable  Anemia of chronic disease  - no signs of active bleeding  Thrombocytopenia  -  secondary to alcohol induced bone marrow damage  - no signs of active bleeding   Consultants:  IR for US guided paracentesis  GI  Procedures/Studies:  02/11 --> US guided paracentesis, 6 L ascites removed  02/13 --> US guided paracentesis, 6 L ascites removed  02/19 --> US guided paracentesis, 6 L ascites removed  2/21 -->  US guided paracentesis, 6L ascites removed Antibiotics:  Levaquin 2/12 --> 2/18   Code Status: Full  Family Communication: Pt at bedside  Disposition Plan: Home when medically stable, note ready yet for discharge   HPI/Subjective: No events overnight.  Abdomen better but still distended  Objective: Filed Vitals:   12/21/13 1635 12/21/13 2121 12/22/13 0604 12/22/13 1510  BP: 111/55 111/59 100/60 113/71  Pulse: 82 87 79 113  Temp: 98.6 F (37 C) 98.1 F (36.7 C) 98.3 F (36.8 C) 98.5 F (36.9 C)  TempSrc: Oral Oral Oral Oral  Resp: 18 18 18 20   Height:      Weight:   98.1 kg (216 lb 4.3 oz)   SpO2: 99% 97% 96% 97%   No intake or output data in the 24 hours ending 12/22/13 1920  Exam:   General:  Pt is alert, follows commands appropriately, not in acute distress, diffuse anasarca   Cardiovascular: Regular rate and rhythm, S1/S2, no murmurs, no rubs, no gallops  Respiratory: Diminished breath sounds bilaterally   Abdomen: Soft, distended with fluid wave, bowel sounds present, nontender  Extremities: upper and lower extremity edema, pulses DP and PT palpable bilaterally  Neuro: Grossly nonfocal  Data Reviewed: Basic Metabolic Panel:  Recent Labs Lab 12/18/13 0503 12/19/13 0450 12/20/13 0510 12/21/13 0523 12/22/13 0557  NA 122* 127* 127* 129* 129*  K 4.1 4.4 4.5 4.4 4.1  CL 89* 91* 94* 95* 97  CO2 25 28 27 27 25   GLUCOSE 108* 100* 118* 108* 108*  BUN 17 17 14 15 14   CREATININE 0.93 0.94 0.85 0.96 0.82  CALCIUM 8.2* 8.2* 8.4 8.1* 8.0*    Recent Labs Lab 12/19/13 0450  AMMONIA 28   CBC:  Recent Labs Lab 12/19/13 0450  12/20/13 0510  WBC 6.8 5.4  HGB 9.8* 10.6*  HCT 29.7* 32.5*  MCV 91.4 91.3  PLT 86* 96*   Scheduled Meds: . feeding supplement (ENSURE COMPLETE)  237 mL Oral TID WC  . folic acid  1 mg Oral Daily  . furosemide  40 mg Oral BID  . lactulose  30 g Oral TID  . lidocaine  1 patch Transdermal Q24H  . LORazepam  0.5 mg Oral QHS,MR X 1  . multivitamin with minerals  1 tablet Oral Daily  . nicotine  14 mg Transdermal Daily  . pantoprazole  40 mg Oral BID  . sodium chloride  3 mL Intravenous Q12H  . spironolactone  100 mg Oral BID  . thiamine  100 mg Oral Daily   Continuous Infusions:  Janece Canterbury, MD  [facility] Pager [phone]  If 7PM-7AM, please contact night-coverage [email or web address] Password [email or web address] 12/22/2013, 7:20 PM   LOS: 12 days

## 2013-12-22 NOTE — Progress Notes (Signed)
    Progress Note   Subjective  Breathing improved following 6 L paracentesis   Objective   Vital signs in last 24 hours: Temp:  [98.1 F (36.7 C)-98.6 F (37 C)] 98.3 F (36.8 C) (02/22 0604) Pulse Rate:  [79-87] 79 (02/22 0604) Resp:  [18] 18 (02/22 0604) BP: (100-126)/(54-72) 100/60 mmHg (02/22 0604) SpO2:  [96 %-99 %] 96 % (02/22 0604) Weight:  [216 lb 4.3 oz (98.1 kg)] 216 lb 4.3 oz (98.1 kg) (02/22 0604) Last BM Date: 12/20/13 General:    white male in NAD Heart:  Regular rate and rhythm Lungs: Respirations even and unlabored, lungs CTA bilaterally Abdomen:  Soft, markedly distended, nontender, normal bowel sounds. Extremities:  Bilateral lower extremity edema Neurologic:  Alert and oriented,  grossly normal neurologically. Psych:  Cooperative. Normal mood and affect.    Lab Results:  Recent Labs  12/20/13 0510  WBC 5.4  HGB 10.6*  HCT 32.5*  PLT 96*   BMET  Recent Labs  12/20/13 0510 12/21/13 0523 12/22/13 0557  NA 127* 129* 129*  K 4.5 4.4 4.1  CL 94* 95* 97  CO2 27 27 25   GLUCOSE 118* 108* 108*  BUN 14 15 14   CREATININE 0.85 0.96 0.82  CALCIUM 8.4 8.1* 8.0*      Assessment / Plan:   88.  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  Despite diuretics he  rapidly accumulates ascitic fluid causing compromise in respirations.  Although we can try pushing his diuretics further, I think the best solution is to consider TIPS placement.  Will discuss with IR 2. HCV / ETOH cirrhosis, positive viral load.      ]

## 2013-12-23 ENCOUNTER — Encounter (HOSPITAL_COMMUNITY): Payer: Self-pay | Admitting: Radiology

## 2013-12-23 LAB — BASIC METABOLIC PANEL
BUN: 13 mg/dL (ref 6–23)
CALCIUM: 8.1 mg/dL — AB (ref 8.4–10.5)
CO2: 24 mEq/L (ref 19–32)
Chloride: 95 mEq/L — ABNORMAL LOW (ref 96–112)
Creatinine, Ser: 0.86 mg/dL (ref 0.50–1.35)
Glucose, Bld: 83 mg/dL (ref 70–99)
POTASSIUM: 4.1 meq/L (ref 3.7–5.3)
Sodium: 128 mEq/L — ABNORMAL LOW (ref 137–147)

## 2013-12-23 LAB — PROTIME-INR
INR: 1.85 — AB (ref 0.00–1.49)
PROTHROMBIN TIME: 20.8 s — AB (ref 11.6–15.2)

## 2013-12-23 LAB — CBC
HCT: 29.8 % — ABNORMAL LOW (ref 39.0–52.0)
Hemoglobin: 10 g/dL — ABNORMAL LOW (ref 13.0–17.0)
MCH: 29.9 pg (ref 26.0–34.0)
MCHC: 33.6 g/dL (ref 30.0–36.0)
MCV: 89.2 fL (ref 78.0–100.0)
PLATELETS: 84 10*3/uL — AB (ref 150–400)
RBC: 3.34 MIL/uL — ABNORMAL LOW (ref 4.22–5.81)
RDW: 16.3 % — AB (ref 11.5–15.5)
WBC: 6.9 10*3/uL (ref 4.0–10.5)

## 2013-12-23 LAB — AFP TUMOR MARKER: AFP-Tumor Marker: 7.6 ng/mL (ref 0.0–8.0)

## 2013-12-23 LAB — HEPATIC FUNCTION PANEL
ALT: 36 U/L (ref 0–53)
AST: 66 U/L — AB (ref 0–37)
Albumin: 1.8 g/dL — ABNORMAL LOW (ref 3.5–5.2)
Alkaline Phosphatase: 107 U/L (ref 39–117)
BILIRUBIN DIRECT: 0.5 mg/dL — AB (ref 0.0–0.3)
BILIRUBIN INDIRECT: 0.6 mg/dL (ref 0.3–0.9)
Total Bilirubin: 1.1 mg/dL (ref 0.3–1.2)
Total Protein: 5.8 g/dL — ABNORMAL LOW (ref 6.0–8.3)

## 2013-12-23 MED ORDER — RIFAXIMIN 550 MG PO TABS
550.0000 mg | ORAL_TABLET | Freq: Two times a day (BID) | ORAL | Status: DC
  Administered 2013-12-23 – 2013-12-30 (×14): 550 mg via ORAL
  Filled 2013-12-23 (×17): qty 1

## 2013-12-23 NOTE — H&P (Signed)
Referring Physician: Dr. Deatra Ina HPI: Oscar Swanson is an 53 y.o. male who presented to ED 12/10/13 with complaints of shortness of breath and swelling. The patient significant PMHx includes alcohol and HCV cirrhosis, he states his last alcohol drink was 3 days prior to admission and he states today he is "finished with drinking." He has a history of esophageal varices with bleeding s/p EGD and banding 10/2013, he denies any active bleeding, he is on PPI and states he does take his medication. He does have a history of hepatic encephalopathy and states today he feels his thinking is clear, but the confusion comes and goes. He denies any chest pain, and states his shortness of breath has improved since recent paracentesis. Dr. Anselm Pancoast spoke to the patient today regarding TIPS procedure and he would like to move forward with this procedure in hopes of helping his recurrent ascites. He states his sister Illene Regulus is his POA and she is not present today, we will call her to get consent and discuss the procedure. The patient states after discharge he will be living with his other sister.   Past Medical History:  Past Medical History  Diagnosis Date  . Diabetes mellitus without complication   . Schizophrenia     onset age 57.  in and out of psych facilities from age 58 to age 81.  s/p electroconvulsive therapy.  . TB (tuberculosis)     sister says this dx'd at age 63 and treated  . Hepatic encephalopathy   . Esophageal varices   . GI bleed   . Cirrhosis   . Thrombocytopenia   . Alcohol abuse     Past Surgical History:  Past Surgical History  Procedure Laterality Date  . Cataract surgery  Bilateral   . Esophagogastroduodenoscopy N/A 11/08/2013    Procedure: ESOPHAGOGASTRODUODENOSCOPY (EGD);  Surgeon: Milus Banister, MD;  Location: [place];  Service: Endoscopy;  Laterality: N/A;  bedside    Family History:  Family History  Problem Relation Age of Onset  . Hypertension Mother   . Colon cancer Neg Hx    . Lung cancer Mother     smoker  . Colon polyps Sister     twin sister    Social History:  reports that he has been smoking Cigarettes.  He has a 12.5 pack-year smoking history. He has never used smokeless tobacco. He reports that he drinks alcohol. He reports that he uses illicit drugs (Cocaine).  Allergies:  Allergies  Allergen Reactions  . Haldol [Haloperidol]     afib problems    Medications:   Medication List    ASK your doctor about these medications       beclomethasone 80 MCG/ACT inhaler  Commonly known as:  QVAR  Inhale 2 puffs into the lungs 2 (two) times daily.     ENSURE HIGH PROTEIN Liqd  Take 20 mls twice daily     folic acid 1 MG tablet  Commonly known as:  FOLVITE  Take 1 tablet (1 mg total) by mouth daily.     furosemide 40 MG tablet  Commonly known as:  LASIX  Take 1 tablet (40 mg total) by mouth 2 (two) times daily.     lactulose 10 GM/15ML solution  Commonly known as:  CHRONULAC  Take 30 mLs (20 g total) by mouth 3 (three) times daily.     pantoprazole 40 MG tablet  Commonly known as:  PROTONIX  Take 40 mg by mouth 2 (two) times daily.  spironolactone 100 MG tablet  Commonly known as:  ALDACTONE  Take 2 tablets (200 mg total) by mouth daily.     sulfamethoxazole-trimethoprim 800-160 MG per tablet  Commonly known as:  BACTRIM DS  Take 1 tablet by mouth 2 (two) times daily.     thiamine 100 MG tablet  Commonly known as:  VITAMIN B-1  Take 1 tablet (100 mg total) by mouth daily.       Please HPI for pertinent positives, otherwise complete 10 system ROS negative.  Physical Exam: BP 120/73  Pulse 92  Temp(Src) 98.3 F (36.8 C) (Oral)  Resp 18  Ht 5' 8.5" (1.74 m)  Wt 218 lb 4.1 oz (99 kg)  BMI 32.70 kg/m2  SpO2 96% Body mass index is 32.7 kg/(m^2).  General Appearance:  Alert, cooperative, no distress  Head:  Normocephalic, without obvious abnormality, atraumatic  Neck: Supple, symmetrical, trachea midline  Lungs:    Diminished bilaterally, no w/r/r, respirations unlabored without use of accessory muscles.  Chest Wall:  No tenderness or deformity  Heart:  Regular rate and rhythm, S1, S2 normal, no murmur, rub or gallop.  Abdomen:   Soft, non-tender, non distended, (+) BS  Extremities: Extremities 2-3+ pitting edema, atraumatic, no cyanosis  Pulses: 1+ and symmetric  Neurologic: Normal affect, no gross deficits.   Results for orders placed during the hospital encounter of 12/10/13 (from the past 48 hour(s))  BASIC METABOLIC PANEL     Status: Abnormal   Collection Time    12/22/13  5:57 AM      Result Value Ref Range   Sodium 129 (*) 137 - 147 mEq/L   Potassium 4.1  3.7 - 5.3 mEq/L   Chloride 97  96 - 112 mEq/L   CO2 25  19 - 32 mEq/L   Glucose, Bld 108 (*) 70 - 99 mg/dL   BUN 14  6 - 23 mg/dL   Creatinine, Ser 0.82  0.50 - 1.35 mg/dL   Calcium 8.0 (*) 8.4 - 10.5 mg/dL   GFR calc non Af Amer >90  >90 mL/min   GFR calc Af Amer >90  >90 mL/min   Comment: (NOTE)     The eGFR has been calculated using the CKD EPI equation.     This calculation has not been validated in all clinical situations.     eGFR's persistently <90 mL/min signify possible Chronic Kidney     Disease.  AFP TUMOR MARKER     Status: None   Collection Time    12/23/13  4:50 AM      Result Value Ref Range   AFP-Tumor Marker 7.6  0.0 - 8.0 ng/mL   Comment: (NOTE)     The Advia Centaur AFP immunoassay method is used.  Results obtained     with different assay methods or kits cannot be used interchangeably.     AFP is a valuable aid in the management of nonseminomatous testicular     cancer patients when used in conjunction with information available     from the clinical evaluation and other diagnostic procedures.     Increased serum AFP concentrations have also been observed in ataxia     telangiectasia, hereditary tyrosinemia, primary hepatocellular     carcinoma, teratocarcinoma, gastrointestinal tract cancers with and      without liver metastases, and in benign hepatic conditions such as     acute viral hepatitis, chronic active hepatitis, and cirrhosis.  This     result cannot be interpreted  as absolute evidence of the presence or     absence of malignant disease.  This result is not interpretable in     pregnant females.     Performed at [facility]     Status: Abnormal   Collection Time    12/23/13  4:50 AM      Result Value Ref Range   Sodium 128 (*) 137 - 147 mEq/L   Potassium 4.1  3.7 - 5.3 mEq/L   Chloride 95 (*) 96 - 112 mEq/L   CO2 24  19 - 32 mEq/L   Glucose, Bld 83  70 - 99 mg/dL   BUN 13  6 - 23 mg/dL   Creatinine, Ser 0.86  0.50 - 1.35 mg/dL   Calcium 8.1 (*) 8.4 - 10.5 mg/dL   GFR calc non Af Amer >90  >90 mL/min   GFR calc Af Amer >90  >90 mL/min   Comment: (NOTE)     The eGFR has been calculated using the CKD EPI equation.     This calculation has not been validated in all clinical situations.     eGFR's persistently <90 mL/min signify possible Chronic Kidney     Disease.  CBC     Status: Abnormal   Collection Time    12/23/13  4:50 AM      Result Value Ref Range   WBC 6.9  4.0 - 10.5 K/uL   RBC 3.34 (*) 4.22 - 5.81 MIL/uL   Hemoglobin 10.0 (*) 13.0 - 17.0 g/dL   HCT 29.8 (*) 39.0 - 52.0 %   MCV 89.2  78.0 - 100.0 fL   MCH 29.9  26.0 - 34.0 pg   MCHC 33.6  30.0 - 36.0 g/dL   RDW 16.3 (*) 11.5 - 15.5 %   Platelets 84 (*) 150 - 400 K/uL   Comment: CONSISTENT WITH PREVIOUS RESULT  PROTIME-INR     Status: Abnormal   Collection Time    12/23/13  4:50 AM      Result Value Ref Range   Prothrombin Time 20.8 (*) 11.6 - 15.2 seconds   INR 1.85 (*) 0.00 - 1.49  HEPATIC FUNCTION PANEL     Status: Abnormal   Collection Time    12/23/13  4:50 AM      Result Value Ref Range   Total Protein 5.8 (*) 6.0 - 8.3 g/dL   Albumin 1.8 (*) 3.5 - 5.2 g/dL   AST 66 (*) 0 - 37 U/L   ALT 36  0 - 53 U/L   Alkaline Phosphatase 107  39 - 117 U/L   Total Bilirubin  1.1  0.3 - 1.2 mg/dL   Bilirubin, Direct 0.5 (*) 0.0 - 0.3 mg/dL   Indirect Bilirubin 0.6  0.3 - 0.9 mg/dL   US Paracentesis  12/22/2013   CLINICAL DATA:  Hepatitis C, cirrhosis, recurrent ascites. Request is made for therapeutic paracentesis up to 6 liters.  EXAM: ULTRASOUND GUIDED THERAPEUTIC PARACENTESIS  COMPARISON:  PREVIOUS PARACENTESIS ON 12/19/2013  PROCEDURE: An ultrasound guided paracentesis was thoroughly discussed with the patient and questions answered. The benefits, risks, alternatives and complications were also discussed. The patient understands and wishes to proceed with the procedure. Written consent was obtained.  Ultrasound was performed to localize and mark an adequate pocket of fluid in the left lower quadrant of the abdomen. The area was then prepped and draped in the normal sterile fashion. 1% Lidocaine was used for local anesthesia. Under ultrasound  guidance a 19 gauge Yueh catheter was introduced. Paracentesis was performed. The catheter was removed and a dressing applied.  Complications: None.  FINDINGS: A total of approximately 6 liters of turbid, amber fluid was removed.  IMPRESSION: Successful ultrasound guided therapeutic paracentesis yielding 6 liters of ascites.  Read by: Rowe Robert ,P.A.-C.   Electronically Signed   By: Maryclare Bean M.D.   On: 12/22/2013 08:08   Assessment/Plan Alcohol/HCV cirrhosis, MELD 14. Esophageal varices with bleeding s/p banding 10/2013, currently stable on PPI. Refractory ascites s/p recent multiple paracentesis 6 liters. Encephalopathy, stable, on lactulose and rifaximin.  Coagulopathy, INR 1.85, will need FFP prior and during procedure. Thrombocytopenia, plt 84 today. Anemia, labs stable, no active signs of bleeding. History of drug abuse. History of schizophrenia, no current medical therapy.. Labs and images reviewed, will order US liver doppler. Request for image guided TIPS procedure. Risks and benefits discussed with the patient today  including, but not limited to worsening liver failure, worsening encephalopathy, or even death. He states he would like to proceed given his recurrent ascites. The patient's POA (sister Margot Ables) has been contacted and a voicemail was left.  IR will contact anesthesia and work on setting TIPS procedure up within the week. Dr. Anselm Pancoast has seen the patient today and agrees with the above plan.     Tsosie Billing D PA-C 12/23/2013, 12:57 PM   Agree with PA note.  I was present for the entire consult.  Discussed patient with Dr. Deatra Ina and GI will start pre-treatment for encephalopathy prior to TIPS.  Hope to schedule TIPS at [facility] this week with anesthesia.

## 2013-12-23 NOTE — Progress Notes (Signed)
Clinical Social Work  CSW met with patient at bedside. Patient reports he had a good weekend and that family came to visit him. Patient reports that he is still planning to live with [name] once he DC from the hospital. Patient spoke about his brother that lives in [place] and reports that brother reports he could live with him as well. CSW explained that he would still be evaluated by state for [place] in case ALF placement is desired. Patient agreeable to plans but reports he thinks he will live with family LT and will not need SNF or ALF placement. Patient reports he is more motivated to work with PT and knows he will get better.   CSW will continue to follow to assist as needed but patient's plans to DC home at this time.  [place], [place] [phone]

## 2013-12-23 NOTE — Progress Notes (Signed)
Pt requesting abdominal binder.

## 2013-12-23 NOTE — Progress Notes (Signed)
Physical Therapy Treatment Patient Details Name: Oscar Swanson MRN: [id number] DOB: Nov 16, 1960 Today's Date: 12/23/2013 Time: 3532-9924 PT Time Calculation (min): 23 min  PT Assessment / Plan / Recommendation  History of Present Illness 53 yo male admitted with massive ascites, SOB. Hx of cirrhosis, hep encephalopathy, shizonphrenia, DM. Lives alone in apt.    PT Comments   **Pt reporting that he'd like to walk more, and have a WC in room so he can get out of the room on his own. Pt also requesting abdominal binder, RN notified. Progressing well with mobility. *  Follow Up Recommendations  SNF     Does the patient have the potential to tolerate intense rehabilitation     Barriers to Discharge        Equipment Recommendations  Rolling walker with 5" wheels    Recommendations for Other Services    Frequency Min 3X/week   Progress towards PT Goals    Plan Current plan remains appropriate    Precautions / Restrictions Precautions Precautions: Fall Restrictions Weight Bearing Restrictions: No   Pertinent Vitals/Pain **HR 108 with walking SaO2 99% on RA walking 2/4 dyspnea with walking 0/10 pain*    Mobility  Bed Mobility Overal bed mobility: Needs Assistance Rolling: Min assist Sidelying to sit: Supervision General bed mobility comments: VCs for technique to log roll to R and for sidelying to sit, bed flat Transfers Equipment used: Rolling walker (2 wheeled) Transfers: Sit to/from Stand Sit to Stand: Supervision General transfer comment: VCs hand placement, bed elevated Ambulation/Gait Ambulation/Gait assistance: Supervision Ambulation Distance (Feet): 300 Feet Assistive device: Rolling walker (2 wheeled);None Gait Pattern/deviations: WFL(Within Functional Limits) General Gait Details: VCs for positioning in RW, tends to walk too far behind RW. SaO2 99% on RA, HR 108 with walking, 2/4 dyspnea.    Exercises     PT Diagnosis:    PT Problem List:   PT Treatment  Interventions:     PT Goals (current goals can now be found in the care plan section) Acute Rehab PT Goals Patient Stated Goal: to walk more  Visit Information  Last PT Received On: 12/23/13 Assistance Needed: +1 History of Present Illness: 53 yo male admitted with massive ascites, SOB. Hx of cirrhosis, hep encephalopathy, shizonphrenia, DM. Lives alone in apt.     Subjective Data  Patient Stated Goal: to walk more   Cognition  Cognition Arousal/Alertness: Awake/alert Behavior During Therapy: WFL for tasks assessed/performed Overall Cognitive Status: Within Functional Limits for tasks assessed    Balance     End of Session PT - End of Session Equipment Utilized During Treatment: Gait belt Activity Tolerance: Patient tolerated treatment well (B LE/foot edema) Patient left: in chair;with chair alarm set;with call bell/phone within reach Nurse Communication: Mobility status   GP     Oscar Swanson 12/23/2013, 11:45 AM [phone]

## 2013-12-23 NOTE — Progress Notes (Signed)
Patient ID: Oscar Swanson, male   DOB: 14-Jul-1961, 53 y.o.   MRN: [id number]  [facility] PROGRESS NOTE  Oscar Swanson:[id number] DOB: Aug 14, 1961 DOA: 12/10/2013 PCP: Oscar Chessman, MD  Brief narrative:  53 y.o. male with Past medical history of alcohol cirrhosis, hepatic encephalopathy, active smoker, esophageal varices, chronic thrombocytopenia. Patient presented with complaints of abdominal distention and shortness of breath. He was seen at GI clinic and was recommended for admission for tense ascites. He has bilateral leg swelling which worsened and was significantly red one week ago for which he was seen in the community clinic and was placed on oral Bactrim.   Principal Problem:  Ascites secondary to history of alcohol abuse  - abdomen still distended, last paracentesis 2/19 and 6 L fluid removed  - total of 24 liters of ascites removed (once on 2/11, second on 2/13, third 2/19, fourth on 2/21)  - GI arranging for TIPS by IR  - continue Lasix, spironolactone  - also continue thiamine, folic acid, MVI daily  - appreciate GI input  Active Problems:  Esophageal varices with bleeding  - currently stable  - continue Protonix for now  - appreciate GI input  Acute hypoxic respiratory failure  - secondary to PNA - completed therapy with Levaquin for 7 days  - shortness of breath at this point secondary to severe ascites and resulting abdominal distension  Hyponatremia  - due to cirrhosis and improved with diuresis and paracentesis  - chronic and secondary to volume status, third spacing, use of Lasix  - overall stable and better over the past 24 hours 122 --> 127 --> 129  --> 128 lbs this AM  - BMP in AM  Severe malnutrition  - secondary to progressive liver cirrhosis and FTT - continue 2 gram sodium diet  - nutrition consultations appreciated  Hepatitis C  - CMET overall stable  Anemia of chronic disease  - no signs of active bleeding  Thrombocytopenia  -  secondary to alcohol induced bone marrow damage  - no signs of active bleeding   Consultants:  IR for US guided paracentesis  GI  Procedures/Studies:  02/11 --> US guided paracentesis, 6 L ascites removed  02/13 --> US guided paracentesis, 6 L ascites removed  02/19 --> US guided paracentesis, 6 L ascites removed  02/21 --> US guided paracentesis, 6 L ascites removed Antibiotics:  Levaquin 2/12 --> 2/18   Code Status: Full  Family Communication: Pt at bedside  Disposition Plan: Home when medically stable, not ready yet for discharge   HPI/Subjective: No events overnight.   Objective: Filed Vitals:   12/22/13 1510 12/22/13 2212 12/23/13 0607 12/23/13 1427  BP: 113/71 120/74 120/73 122/74  Pulse: 113 87 92 87  Temp: 98.5 F (36.9 C) 98.6 F (37 C) 98.3 F (36.8 C) 98 F (36.7 C)  TempSrc: Oral Oral Oral Oral  Resp: 20 18 18 16   Height:      Weight:   99 kg (218 lb 4.1 oz)   SpO2: 97% 98% 96% 99%    Intake/Output Summary (Last 24 hours) at 12/23/13 1829 Last data filed at 12/23/13 1605  Gross per 24 hour  Intake    720 ml  Output    250 ml  Net    470 ml    Exam:   General:  Pt is alert, follows commands appropriately, not in acute distress  Cardiovascular: Regular rate and rhythm, S1/S2, no murmurs, no rubs, no gallops  Respiratory: Clear to  auscultation bilaterally, no wheezing, no crackles, no rhonchi  Abdomen: distended, bowel sounds present, no guarding  Extremities: upper and lower extremity edema, pulses DP and PT palpable bilaterally  Neuro: Grossly nonfocal  Data Reviewed: Basic Metabolic Panel:  Recent Labs Lab 12/19/13 0450 12/20/13 0510 12/21/13 0523 12/22/13 0557 12/23/13 0450  NA 127* 127* 129* 129* 128*  K 4.4 4.5 4.4 4.1 4.1  CL 91* 94* 95* 97 95*  CO2 28 27 27 25 24   GLUCOSE 100* 118* 108* 108* 83  BUN 17 14 15 14 13   CREATININE 0.94 0.85 0.96 0.82 0.86  CALCIUM 8.2* 8.4 8.1* 8.0* 8.1*   Liver Function Tests:  Recent  Labs Lab 12/23/13 0450  AST 66*  ALT 36  ALKPHOS 107  BILITOT 1.1  PROT 5.8*  ALBUMIN 1.8*    Recent Labs Lab 12/19/13 0450  AMMONIA 28   CBC:  Recent Labs Lab 12/19/13 0450 12/20/13 0510 12/23/13 0450  WBC 6.8 5.4 6.9  HGB 9.8* 10.6* 10.0*  HCT 29.7* 32.5* 29.8*  MCV 91.4 91.3 89.2  PLT 86* 96* 84*   Scheduled Meds: . feeding supplement (ENSURE COMPLETE)  237 mL Oral TID WC  . folic acid  1 mg Oral Daily  . furosemide  40 mg Oral BID  . lactulose  30 g Oral TID  . lidocaine  1 patch Transdermal Q24H  . LORazepam  0.5 mg Oral QHS,MR X 1  . multivitamin with minerals  1 tablet Oral Daily  . nicotine  14 mg Transdermal Daily  . pantoprazole  40 mg Oral BID  . rifaximin  550 mg Oral BID  . sodium chloride  3 mL Intravenous Q12H  . spironolactone  100 mg Oral BID  . thiamine  100 mg Oral Daily   Continuous Infusions:   Faye Ramsay, MD  [facility] Pager [phone]  If 7PM-7AM, please contact night-coverage [email or web address] Password [facility] 12/23/2013, 6:29 PM   LOS: 13 days

## 2013-12-23 NOTE — Progress Notes (Signed)
[facility] Progress Note  Subjective:  In good spirits.  Is willing to try TIPS after speaking with IR today.  Getting more short of breath again from ascites.  Objective:  Vital signs in last 24 hours: Temp:  [98.3 F (36.8 C)-98.6 F (37 C)] 98.3 F (36.8 C) (02/23 0607) Pulse Rate:  [87-113] 92 (02/23 0607) Resp:  [18-20] 18 (02/23 0607) BP: (113-120)/(71-74) 120/73 mmHg (02/23 0607) SpO2:  [96 %-98 %] 96 % (02/23 0607) Weight:  [218 lb 4.1 oz (99 kg)] 218 lb 4.1 oz (99 kg) (02/23 0607) Last BM Date: 12/20/13 General:  Alert, chronically ill-appearing, in NAD Heart:  Regular rate and rhythm; no murmurs Pulm:  Mild end expiratory wheezing noted on the left. Abdomen:  Soft, markedly distended.  BS present.  Non-tender.   Extremities:  B/L LE pitting edema. Neurologic:  Alert and  oriented x4;  grossly normal neurologically. Psych:  Alert and cooperative. Normal mood and affect.  Intake/Output this shift: Total I/O In: 480 [P.O.:480] Out: -   Lab Results:  Recent Labs  12/23/13 0450  WBC 6.9  HGB 10.0*  HCT 29.8*  PLT 84*   BMET  Recent Labs  12/21/13 0523 12/22/13 0557 12/23/13 0450  NA 129* 129* 128*  K 4.4 4.1 4.1  CL 95* 97 95*  CO2 27 25 24   GLUCOSE 108* 108* 83  BUN 15 14 13   CREATININE 0.96 0.82 0.86  CALCIUM 8.1* 8.0* 8.1*   LFT  Recent Labs  12/23/13 0450  PROT 5.8*  ALBUMIN 1.8*  AST 66*  ALT 36  ALKPHOS 107  BILITOT 1.1  BILIDIR 0.5*  IBILI 0.6   PT/INR  Recent Labs  12/23/13 0450  LABPROT 20.8*  INR 1.85*   Dg Chest 2 View  12/21/2013   CLINICAL DATA:  Shortness of breath, dyspnea, wheezing, history diabetes, TB, cirrhosis  EXAM: CHEST  2 VIEW  COMPARISON:  12/10/2013  FINDINGS: Normal heart size, mediastinal contours and pulmonary vascularity.  Chronic accentuation of medial right lung base markings, slightly improved.  Small bilateral pleural effusions decreased since previous exam.  No definite acute  infiltrate, pleural effusion or pneumothorax.  Bones unremarkable.  IMPRESSION: Decreased pleural effusion and accentuated right basilar opacification since previous exam.   Electronically Signed   By: Lavonia Dana M.D.   On: 12/21/2013 16:54   US Paracentesis  12/22/2013   CLINICAL DATA:  Hepatitis C, cirrhosis, recurrent ascites. Request is made for therapeutic paracentesis up to 6 liters.  EXAM: ULTRASOUND GUIDED THERAPEUTIC PARACENTESIS  COMPARISON:  PREVIOUS PARACENTESIS ON 12/19/2013  PROCEDURE: An ultrasound guided paracentesis was thoroughly discussed with the patient and questions answered. The benefits, risks, alternatives and complications were also discussed. The patient understands and wishes to proceed with the procedure. Written consent was obtained.  Ultrasound was performed to localize and mark an adequate pocket of fluid in the left lower quadrant of the abdomen. The area was then prepped and draped in the normal sterile fashion. 1% Lidocaine was used for local anesthesia. Under ultrasound guidance a 19 gauge Yueh catheter was introduced. Paracentesis was performed. The catheter was removed and a dressing applied.  Complications: None.  FINDINGS: A total of approximately 6 liters of turbid, amber fluid was removed.  IMPRESSION: Successful ultrasound guided therapeutic paracentesis yielding 6 liters of ascites.  Read by: Rowe Robert ,P.A.-C.   Electronically Signed   By: Maryclare Bean M.D.   On: 12/22/2013 08:08    Assessment / Plan: 1. 52  year old male with HCV/ETOH cirrhosis. Despite diuretics (now on lasix 40 mg BID and aldactone 100 mg BID) he rapidly accumulates ascitic fluid causing compromise in respiratory status. Although we can try pushing his diuretics further pending cooperation of sodium levels and renal function, the best solution is to consider TIPS placement.  AFP is 7.6.  Last tap was 2/22 with 6 Liters removed. 2. HCV, positive viral load.   -Monitor renal function, lytes,  and coags. -Will place him on xifaxan 550 mg BID empirically before undergoing TIPS.  IR trying to plan TIPS within the week. -May need another paracentesis prior to TIPS depending when it will be planned.    LOS: 13 days   ZEHR, JESSICA D.  12/23/2013, 10:24 AM  Pager number [phone]     Attending physician's note   I have taken an interval history, reviewed the chart and examined the patient. I agree with the Advanced Practitioner's note, impression and recommendations. ETOH/HCV cirrhosis with refractory ascites/anasarca. IR evaluating for TIPS. Xifaxan added today.   Pricilla Riffle. Fuller Plan, MD [facility]

## 2013-12-24 ENCOUNTER — Inpatient Hospital Stay (HOSPITAL_COMMUNITY): Payer: Medicaid Other

## 2013-12-24 DIAGNOSIS — K769 Liver disease, unspecified: Secondary | ICD-10-CM

## 2013-12-24 LAB — BASIC METABOLIC PANEL
BUN: 13 mg/dL (ref 6–23)
CO2: 26 mEq/L (ref 19–32)
Calcium: 8.1 mg/dL — ABNORMAL LOW (ref 8.4–10.5)
Chloride: 94 mEq/L — ABNORMAL LOW (ref 96–112)
Creatinine, Ser: 0.89 mg/dL (ref 0.50–1.35)
GFR calc Af Amer: >90 mL/min (ref >90)
GFR calc non Af Amer: >90 mL/min (ref >90)
Glucose, Bld: 100 mg/dL — ABNORMAL HIGH (ref 70–99)
Potassium: 4.2 mEq/L (ref 3.7–5.3)
Sodium: 125 mEq/L — ABNORMAL LOW (ref 137–147)

## 2013-12-24 LAB — CBC
HEMATOCRIT: 29.6 % — AB (ref 39.0–52.0)
HEMOGLOBIN: 9.8 g/dL — AB (ref 13.0–17.0)
MCH: 29.5 pg (ref 26.0–34.0)
MCHC: 33.1 g/dL (ref 30.0–36.0)
MCV: 89.2 fL (ref 78.0–100.0)
Platelets: 89 10*3/uL — ABNORMAL LOW (ref 150–400)
RBC: 3.32 MIL/uL — AB (ref 4.22–5.81)
RDW: 16.3 % — ABNORMAL HIGH (ref 11.5–15.5)
WBC: 7.9 10*3/uL (ref 4.0–10.5)

## 2013-12-24 LAB — PROTIME-INR
INR: 1.83 — AB (ref 0.00–1.49)
Prothrombin Time: 20.6 seconds — ABNORMAL HIGH (ref 11.6–15.2)

## 2013-12-24 MED ORDER — FUROSEMIDE 40 MG PO TABS
40.0000 mg | ORAL_TABLET | Freq: Every day | ORAL | Status: DC
  Administered 2013-12-25 – 2013-12-30 (×5): 40 mg via ORAL
  Filled 2013-12-24 (×6): qty 1

## 2013-12-24 NOTE — Progress Notes (Signed)
Patient instructed to be NPO for abdominal ultrasound this afternoon after 2pm.  Patient verbalized understanding

## 2013-12-24 NOTE — Progress Notes (Addendum)
IR follow up. Seen again today, with Dr. Barbie Banner Pt feels ok  BP 115/62  Pulse 85  Temp(Src) 98 F (36.7 C) (Oral)  Resp 18  Ht 5' 8.5" (1.74 m)  Wt 220 lb 3.8 oz (99.9 kg)  BMI 33.00 kg/m2  SpO2 98%  For US/Duplex liver today. INR still 1.8 Will need FFP prior and during TIPS procedure.  Long discussion again today with pt as well as with sister Dene via telephone. Explained procedure, risks, complications, and anticipated recovery/follow up needs.  Have confirmed case for Thurs 2/26 with anesthesia to be done at [facility] hospital with plan for pt to return to [facility] after.    Ascencion Dike PA-C Interventional Radiology 12/24/2013 12:48 PM

## 2013-12-24 NOTE — Progress Notes (Signed)
[facility] Progress Note  Subjective:  In good spirits.  No new complaints.  Says taht breathing is ok right now.  Objective:  Vital signs in last 24 hours: Temp:  [98 F (36.7 C)-98.2 F (36.8 C)] 98 F (36.7 C) (02/24 0544) Pulse Rate:  [85-91] 85 (02/24 0544) Resp:  [16-18] 18 (02/24 0544) BP: (107-122)/(60-74) 115/62 mmHg (02/24 0544) SpO2:  [97 %-99 %] 98 % (02/24 0544) Weight:  [220 lb 3.8 oz (99.9 kg)] 220 lb 3.8 oz (99.9 kg) (02/24 0542) Last BM Date: 12/23/13 General:  Alert, chronically ill-appearing, in NAD; some conversational dyspnea. Heart:  Regular rate and rhythm; no murmurs Pulm:  CTAB.  No W/R/R. Abdomen:  Soft, markedly distended. Normal bowel sounds.  Non-tender.   Extremities:  B/L LE pitting edema. Neurologic:  Alert and  oriented x4;  grossly normal neurologically. Psych:  Alert and cooperative. Normal mood and affect.  Intake/Output from previous day: 02/23 0701 - 02/24 0700 In: 1914 [P.O.:1914] Out: 1520 [Urine:1520]  Lab Results:  Recent Labs  12/23/13 0450 12/24/13 0520  WBC 6.9 7.9  HGB 10.0* 9.8*  HCT 29.8* 29.6*  PLT 84* 89*   BMET  Recent Labs  12/22/13 0557 12/23/13 0450 12/24/13 0520  NA 129* 128* 125*  K 4.1 4.1 4.2  CL 97 95* 94*  CO2 25 24 26   GLUCOSE 108* 83 100*  BUN 14 13 13   CREATININE 0.82 0.86 0.89  CALCIUM 8.0* 8.1* 8.1*   LFT  Recent Labs  12/23/13 0450  PROT 5.8*  ALBUMIN 1.8*  AST 66*  ALT 36  ALKPHOS 107  BILITOT 1.1  BILIDIR 0.5*  IBILI 0.6   PT/INR  Recent Labs  12/23/13 0450 12/24/13 0520  LABPROT 20.8* 20.6*  INR 1.85* 1.83*    Assessment / Plan: 20. 53 year old male with HCV/ETOH cirrhosis. Despite diuretics (lasix 40 mg BID and aldactone 100 mg BID) he rapidly accumulates ascitic fluid causing compromise in respiratory status.  Sodium levels have been decreasing on diuretics.  IR has evaluated the patient and trying to get him set up for TIPS placement at [facility] within the  week; they have also ordered an ultrasound with doppler. AFP is 7.6. Last tap was 2/21 with 6 Liters removed.  2. HCV, positive viral load.  3.  Hyponatremia:  Secondary to cirrhosis and increases in diuretics.    -Monitor renal function, lytes, and coags.  -Continue xifaxan 550 mg BID empirically before undergoing TIPS and will continue after placement. -May need another paracentesis prior to TIPS depending when it will be planned.  -Due to decreasing sodium level, I have discontinued his spironolactone and decreased his lasix to 40 mg once daily for now.    LOS: 14 days   ZEHR, JESSICA D.  12/24/2013, 9:09 AM  Pager number [phone]     Attending physician's note   I have taken an interval history, reviewed the chart and examined the patient. I agree with the Advanced Practitioner's note, impression and recommendations. DC aldactone and decrease lasix with worsening hyponatremia. Continue Xifaxan and lactulose. Abd [place] with doppler ordered. Could require another large volume paracentesis prior to TIPS. Anticipating TIPS later this week.   Pricilla Riffle. Fuller Plan, MD [facility]

## 2013-12-24 NOTE — Progress Notes (Signed)
Clinical Social Work  CSW met with patient at bedside. Patient reports he is waiting for procedure at [facility]. Patient reports he still plans to DC home with sister but needs equipment. CSW explained CM will set up equipment at DC. Patient reports understanding. CSW spoke about ALF placement again. Patient reports he is not interested in [place] at this time but will keep information in case placement is needed. CSW will continue to follow.  [place], [place] [phone]

## 2013-12-24 NOTE — Progress Notes (Signed)
Patient ID: Oscar Swanson, male   DOB: November 11, 1960, 53 y.o.   MRN: [id number]  [facility] PROGRESS NOTE  Oscar Swanson UMP:[id number] DOB: 03/22/1961 DOA: 12/10/2013 PCP: Angelica Chessman, MD  Brief narrative:  53 y.o. male with Past medical history of alcohol cirrhosis, hepatic encephalopathy, active smoker, esophageal varices, chronic thrombocytopenia. Patient presented with complaints of abdominal distention and shortness of breath. He was seen at GI clinic and was recommended for admission for tense ascites. He has bilateral leg swelling which worsened and was significantly red one week ago for which he was seen in the community clinic and was placed on oral Bactrim.   Principal Problem:  Ascites secondary to history of alcohol abuse  - abdomen still distended, last paracentesis 2/19 and 6 L fluid removed  - total of 24 liters of ascites removed (once on 2/11, second on 2/13, third 2/19, fourth on 2/21)  - Interventional radiology consulted, plan for TIPS procedure on Thurs.   - continue Lasix, spironolactone   Active Problems:  Esophageal varices with bleeding  - currently stable  - continue Protonix for now  - appreciate GI input  Acute hypoxic respiratory failure  - secondary to PNA - completed therapy with Levaquin for 7 days  - shortness of breath at this point secondary to severe ascites and resulting abdominal distension  Hyponatremia  - due to cirrhosis and improved with diuresis and paracentesis  - chronic and secondary to volume status, third spacing, use of Lasix  - overall stable and better over the past 24 hours 122 --> 127 --> 129  --> 128 lbs this AM  - BMP in AM  Severe malnutrition  - secondary to progressive liver cirrhosis and FTT - continue 2 gram sodium diet  - nutrition consultations appreciated  Hepatitis C  - CMET overall stable  Anemia of chronic disease  - no signs of active bleeding  Thrombocytopenia  - secondary to alcohol induced  bone marrow damage  - no signs of active bleeding   Consultants:  IR for US guided paracentesis  GI  Procedures/Studies:  02/11 --> US guided paracentesis, 6 L ascites removed  02/13 --> US guided paracentesis, 6 L ascites removed  02/19 --> US guided paracentesis, 6 L ascites removed  02/21 --> US guided paracentesis, 6 L ascites removed Antibiotics:  Levaquin 2/12 --> 2/18   Code Status: Full  Family Communication: Pt at bedside  Disposition Plan: Home when medically stable, not ready yet for discharge   HPI/Subjective: Patient reports feeling "about the same" with ongoing abdominal distention. He was made NPO for [place].   Objective: Filed Vitals:   12/23/13 2138 12/24/13 0542 12/24/13 0544 12/24/13 1443  BP: 107/60  115/62 118/65  Pulse: 91  85 84  Temp: 98.2 F (36.8 C)  98 F (36.7 C) 98.7 F (37.1 C)  TempSrc: Oral  Oral Oral  Resp: 16  18 16   Height:      Weight:  99.9 kg (220 lb 3.8 oz)    SpO2: 97%  98% 98%    Intake/Output Summary (Last 24 hours) at 12/24/13 1728 Last data filed at 12/24/13 1446  Gross per 24 hour  Intake    720 ml  Output    850 ml  Net   -130 ml    Exam:   General:  Pt is alert, follows commands appropriately, not in acute distress  Cardiovascular: Regular rate and rhythm, S1/S2, no murmurs, no rubs, no gallops  Respiratory: Clear to  auscultation bilaterally, no wheezing, no crackles, no rhonchi  Abdomen: distended, bowel sounds present, no guarding  Extremities: upper and lower extremity edema, pulses DP and PT palpable bilaterally  Neuro: Grossly nonfocal  Data Reviewed: Basic Metabolic Panel:  Recent Labs Lab 12/20/13 0510 12/21/13 0523 12/22/13 0557 12/23/13 0450 12/24/13 0520  NA 127* 129* 129* 128* 125*  K 4.5 4.4 4.1 4.1 4.2  CL 94* 95* 97 95* 94*  CO2 27 27 25 24 26   GLUCOSE 118* 108* 108* 83 100*  BUN 14 15 14 13 13   CREATININE 0.85 0.96 0.82 0.86 0.89  CALCIUM 8.4 8.1* 8.0* 8.1* 8.1*   Liver Function  Tests:  Recent Labs Lab 12/23/13 0450  AST 66*  ALT 36  ALKPHOS 107  BILITOT 1.1  PROT 5.8*  ALBUMIN 1.8*    Recent Labs Lab 12/19/13 0450  AMMONIA 28   CBC:  Recent Labs Lab 12/19/13 0450 12/20/13 0510 12/23/13 0450 12/24/13 0520  WBC 6.8 5.4 6.9 7.9  HGB 9.8* 10.6* 10.0* 9.8*  HCT 29.7* 32.5* 29.8* 29.6*  MCV 91.4 91.3 89.2 89.2  PLT 86* 96* 84* 89*   Scheduled Meds: . feeding supplement (ENSURE COMPLETE)  237 mL Oral TID WC  . folic acid  1 mg Oral Daily  . [START ON 12/25/2013] furosemide  40 mg Oral Daily  . lactulose  30 g Oral TID  . lidocaine  1 patch Transdermal Q24H  . LORazepam  0.5 mg Oral QHS,MR X 1  . multivitamin with minerals  1 tablet Oral Daily  . nicotine  14 mg Transdermal Daily  . pantoprazole  40 mg Oral BID  . rifaximin  550 mg Oral BID  . sodium chloride  3 mL Intravenous Q12H  . thiamine  100 mg Oral Daily   Continuous Infusions:   [name], MD  [facility] Pager [phone]  If 7PM-7AM, please contact night-coverage [email or web address] Password [email or web address] 12/24/2013, 5:28 PM   LOS: 14 days

## 2013-12-24 NOTE — Progress Notes (Signed)
Occupational Therapy Treatment Patient Details Name: Oscar Swanson MRN: [id number] DOB: 03-27-1961 Today's Date: 12/24/2013 Time: 3267-1245 OT Time Calculation (min): 41 min  OT Assessment / Plan / Recommendation  History of present illness 53 yo male admitted with massive ascites, SOB. Hx of cirrhosis, hep encephalopathy, shizonphrenia, DM. Lives alone in apt.    OT comments  Pt making good progress.  Needs continued cueing for energy conservation and safety  Follow Up Recommendations  SNF    Barriers to Discharge       Equipment Recommendations  3 in 1 bedside comode    Recommendations for Other Services    Frequency     Progress towards OT Goals Progress towards OT goals:Goals met and updated - see care plan  Plan      Precautions / Restrictions Precautions Precautions: Fall Restrictions Weight Bearing Restrictions: No   Pertinent Vitals/Pain No c/o pain    ADL  Lower Body Bathing: Simulated;Supervision/safety (with ae) Where Assessed - Lower Body Bathing: Unsupported sit to stand Lower Body Dressing: Supervision/safety (with ae:  pants only ) Where Assessed - Lower Body Dressing: Unsupported sit to stand Toilet Transfer: Supervision/safety Toilet Transfer Method: Sit to Loss adjuster, chartered: Bedside commode Transfers/Ambulation Related to ADLs: stood x 10 min for grooming and to urinate  supervision for bed mobility ADL Comments: used ae for lb adls, supervision overall x socks     OT Diagnosis:    OT Problem List:   OT Treatment Interventions:     OT Goals(current goals can now be found in the care plan section) ADL Goals Additional ADL Goal #2: pt will not need any more than 1 vc per session for safety Additional ADL Goal #3: pt will initiate at least one rest break per session for energy conservation  Visit Information  Last OT Received On: 12/24/13 Assistance Needed: +1 History of Present Illness: 53 yo male admitted with massive ascites,  SOB. Hx of cirrhosis, hep encephalopathy, shizonphrenia, DM. Lives alone in apt.     Subjective Data      Prior Functioning       Cognition  Cognition Arousal/Alertness: Awake/alert Behavior During Therapy: WFL for tasks assessed/performed Overall Cognitive Status: Within Functional Limits for tasks assessed    Mobility  Bed Mobility Supine to sit: Supervision Transfers Sit to Stand: Supervision General transfer comment: bed elevated.  cues for hand placement    Exercises      Balance    End of Session OT - End of Session Activity Tolerance: Patient tolerated treatment well  GO     Tonimarie Gritz 12/24/2013, 9:49 AM Lesle Chris, OTR/L [phone] 12/24/2013

## 2013-12-25 ENCOUNTER — Inpatient Hospital Stay (HOSPITAL_COMMUNITY): Payer: Medicaid Other

## 2013-12-25 LAB — BASIC METABOLIC PANEL
BUN: 14 mg/dL (ref 6–23)
CHLORIDE: 93 meq/L — AB (ref 96–112)
CO2: 21 meq/L (ref 19–32)
Calcium: 8.4 mg/dL (ref 8.4–10.5)
Creatinine, Ser: 0.77 mg/dL (ref 0.50–1.35)
GFR calc Af Amer: >90 mL/min (ref >90)
GFR calc non Af Amer: >90 mL/min (ref >90)
Glucose, Bld: 111 mg/dL — ABNORMAL HIGH (ref 70–99)
Potassium: 4 mEq/L (ref 3.7–5.3)
SODIUM: 126 meq/L — AB (ref 137–147)

## 2013-12-25 LAB — CBC
HCT: 30.7 % — ABNORMAL LOW (ref 39.0–52.0)
HEMOGLOBIN: 10.4 g/dL — AB (ref 13.0–17.0)
MCH: 30.2 pg (ref 26.0–34.0)
MCHC: 33.9 g/dL (ref 30.0–36.0)
MCV: 89.2 fL (ref 78.0–100.0)
Platelets: 100 10*3/uL — ABNORMAL LOW (ref 150–400)
RBC: 3.44 MIL/uL — ABNORMAL LOW (ref 4.22–5.81)
RDW: 16.7 % — ABNORMAL HIGH (ref 11.5–15.5)
WBC: 12.6 10*3/uL — ABNORMAL HIGH (ref 4.0–10.5)

## 2013-12-25 LAB — BODY FLUID CELL COUNT WITH DIFFERENTIAL
LYMPHS FL: 24 %
MONOCYTE-MACROPHAGE-SEROUS FLUID: 46 % — AB (ref 50–90)
Neutrophil Count, Fluid: 24 % (ref 0–25)
WBC FLUID: 214 uL (ref 0–1000)

## 2013-12-25 LAB — PROTIME-INR
INR: 1.75 — ABNORMAL HIGH (ref 0.00–1.49)
Prothrombin Time: 19.9 seconds — ABNORMAL HIGH (ref 11.6–15.2)

## 2013-12-25 MED ORDER — ALBUMIN HUMAN 25 % IV SOLN
16.0000 g | Freq: Once | INTRAVENOUS | Status: AC
  Administered 2013-12-25: 16 g via INTRAVENOUS
  Filled 2013-12-25: qty 100

## 2013-12-25 MED ORDER — CEFAZOLIN SODIUM-DEXTROSE 2-3 GM-% IV SOLR
2.0000 g | Freq: Once | INTRAVENOUS | Status: DC
  Filled 2013-12-25: qty 50

## 2013-12-25 NOTE — Procedures (Signed)
US guided diagnostic/therapeutic paracentesis performed yielding 4.7 liters turbid, light amber fluid. A portion of the fluid was sent to the lab for preordered studies. No immediate complications.

## 2013-12-25 NOTE — Progress Notes (Signed)
Unable to remove previous lidocaine patch as it is not on the patient's body and patient does not remember if/who/when patch removed

## 2013-12-25 NOTE — Progress Notes (Signed)
Report given to [facility]link.  Patient is napping, no complaints of pain, abdominal bandaid clean dry and intact.

## 2013-12-25 NOTE — Progress Notes (Signed)
Gastroenterology Progress Note  Subjective:  Conversational dyspnea and has difficulty cleaning himself up, etc due to fluid in abdomen causing SOB.  No real abdominal pain, just pressure/tightness from fluid.  Objective:  Vital signs in last 24 hours: Temp:  [98.6 F (37 C)-98.8 F (37.1 C)] 98.8 F (37.1 C) (02/25 0628) Pulse Rate:  [84-109] 96 (02/25 0628) Resp:  [16] 16 (02/25 0628) BP: (118-123)/(64-76) 118/64 mmHg (02/25 0628) SpO2:  [95 %-98 %] 95 % (02/25 0628) Last BM Date: 12/23/13 General:  Alert, chronically ill-appearing, in NAD, but somewhat uncomfortable and SOB Heart:  Regular rate and rhythm; no murmurs Pulm:  CTAB.  No W/R/R.  Conversational dyspnea.  Abdomen:  Soft, markedly distended. Normal bowel sounds.  Non-tender.   Extremities:  Pitting edema in B/L LE's. Neurologic:  Alert and  oriented x4;  grossly normal neurologically. Psych:  Alert and cooperative. Normal mood and affect.  Intake/Output from previous day: 02/24 0701 - 02/25 0700 In: -  Out: 900 [Urine:900]  Lab Results:  Recent Labs  12/23/13 0450 12/24/13 0520 12/25/13 0550  WBC 6.9 7.9 12.6*  HGB 10.0* 9.8* 10.4*  HCT 29.8* 29.6* 30.7*  PLT 84* 89* 100*   BMET  Recent Labs  12/23/13 0450 12/24/13 0520 12/25/13 0550  NA 128* 125* 126*  K 4.1 4.2 4.0  CL 95* 94* 93*  CO2 24 26 21   GLUCOSE 83 100* 111*  BUN 13 13 14   CREATININE 0.86 0.89 0.77  CALCIUM 8.1* 8.1* 8.4   LFT  Recent Labs  12/23/13 0450  PROT 5.8*  ALBUMIN 1.8*  AST 66*  ALT 36  ALKPHOS 107  BILITOT 1.1  BILIDIR 0.5*  IBILI 0.6   PT/INR  Recent Labs  12/24/13 0520 12/25/13 0550  LABPROT 20.6* 19.9*  INR 1.83* 1.75*   [place] Art/ven Flow Abd Pelv Doppler  12/24/2013   CLINICAL DATA:  Cirrhosis, ascites  EXAM: DUPLEX ULTRASOUND OF LIVER  TECHNIQUE: Color and duplex Doppler ultrasound was performed to evaluate the hepatic in-flow and out-flow vessels.  COMPARISON:  12/21/2013, 12/10/2013   FINDINGS: Portal Vein Velocities  Main:  23 cm/sec  Right:  32 cm/sec  Left:  28 cm/sec  Hepatic Vein Velocities  Right:  25 cm/sec  Middle:  23 cm/sec  Left:  23 cm/sec  Hepatic Artery Velocity:  102 cm/sec  Splenic Vein Velocity:  25 cm/sec  Varices: Not visualized  Ascites: Present  Patent portal, hepatic and splenic veins. Normal directional flow. Hepatic cirrhosis noted. Negative for portal vein thrombus or occlusion. Diffuse abdominal ascites.  IMPRESSION: Patent portal, hepatic and splenic veins.  Hepatic cirrhosis  Ascites   Electronically Signed   By: Daryll Brod M.D.   On: 12/24/2013 15:58    Assessment / Plan: 37. 53 year old male with HCV/ETOH cirrhosis. Despite diuretics (lasix 40 mg BID and aldactone 100 mg BID) he rapidly accumulates ascitic fluid causing compromise in respiratory status. Sodium levels were decreasing significantly on diuretics so spironolactone was discontinued and lasix decreased to 40 mg once daily. IR has evaluated the patient and he is scheduled for TIPS placement at [facility] tomorrow, 2/26.  Doppler ultrasound shows patent vessels.  Last tap was 2/21 with 6 Liters removed.  2. HCV, positive viral load.  3. Hyponatremia: Secondary to cirrhosis and increases in diuretics.   -Monitor renal function, lytes, and coags.  -Continue xifaxan 550 mg BID and lactulose 30 mL TID empirically before undergoing TIPS and will continue after placement.  -Has  new leukocytosis today.  Will order large volume paracentesis with albumin for today.  Send fluid for cell count, culture, and gram stain.  Discussed with IR.  They are also considering transferring him to [facility] tonight in preparation for the weather situation overnight. -Continue off spironolactone and only on lasix 40 mg once daily for now.    LOS: 15 days   ZEHR, JESSICA D.  12/25/2013, 9:12 AM  Pager number [phone]    Attending physician's note   I have taken an interval history, reviewed the chart and examined the  patient. I agree with the Advanced Practitioner's note, impression and recommendations. Worsening ascites and anasarca with dyspnea at rest. Hyponatremia is stable. Proceed with large volume paracentesis today with cell counts and culture. Transfer to [facility] planned for today. TIPS planned for tomorrow at [facility].   Pricilla Riffle. Fuller Plan, MD [facility]

## 2013-12-25 NOTE — Progress Notes (Addendum)
Spoke with Corrine in radiology, paracentesis will be done today either at [facility] or [facility] radiology, unsure at moment, so therefore hold albumin until this is known.  Notified Oscar Swanson that he is NPO now as ordered by his doctor for paracentesis today, and to be transferred to [facility] today.  Oscar Swanson verbalized understanding

## 2013-12-25 NOTE — Progress Notes (Signed)
Telephone report given to Dora on 5W.

## 2013-12-25 NOTE — Progress Notes (Signed)
  Pt admitted to the unit. Pt is stable, alert and oriented per baseline. Oriented to room, staff, and call bell. Educated to call for any assistance. Bed in lowest position, call bell within reach- will continue to monitor. 

## 2013-12-25 NOTE — Progress Notes (Signed)
Patient received alert and oriented from radiology asking for food and drink.  Voided 343ml urine, left lower abdominal bandaid clean dry and intact.  Abdomen non tender per patient, no complaints of pain at this time.  Phoned [facility] to arrange transport to [facility] bed 5w34C.

## 2013-12-25 NOTE — Progress Notes (Signed)
PT Cancellation Note  Patient Details Name: Oscar Swanson MRN: [id number] DOB: [birth year]   Cancelled Treatment:    Reason Eval/Treat Not Completed: Medical issues which prohibited therapy (Pt SOB at rest. SaO2 97% on RA. ) Noted plan for surgery at [facility] tomorrow. Will follow.    [name] 12/25/2013, 10:36 AM [phone]

## 2013-12-25 NOTE — Progress Notes (Signed)
Patient ID: Oscar Swanson, male   DOB: Jan 31, 1961, 53 y.o.   MRN: [id number]  [facility] PROGRESS NOTE  Oscar Swanson VQQ:[id number] DOB: 1960-12-02 DOA: 12/10/2013 PCP: Angelica Chessman, MD  Brief narrative:  53 y.o. male with a past medical history of alcohol/HCV cirrhosis, hepatic encephalopathy, intractible ascitis, esophageal varices, chronic thrombocytopenia who presented with complaints of abdominal distention and shortness of breath. He was seen at GI clinic initially and was recommended for admission for tense ascites. Had paracentesis on 12/21/13 with removal of 6L of ascitic fluid. Given respiratory compromise from rapidly accumulating ascitis, GI recommending TIPS. Doppler US showing patent vessels, plan to undergo TIPS Procedure on Thursday 12/26/13. Interventional radiology  And GI recommending transfer to [facility]. Case discussed with Dr Candiss Norse of Internal Medicine who agreed to accept patient in transfer.   Principal Problem:  Ascites secondary to history of alcohol abuse  - abdomen still distended, last paracentesis 2/19 and 6 L fluid removed  - total of 24 liters of ascites removed (once on 2/11, second on 2/13, third 2/19, fourth on 2/21)  - Interventional radiology consulted, plan for TIPS procedure on Thurs.   - continue Lasix, spironolactone   Active Problems:  Esophageal varices with bleeding  - currently stable  - continue Protonix for now  - appreciate GI input  Acute hypoxic respiratory failure  - secondary to PNA - completed therapy with Levaquin for 7 days  - shortness of breath at this point secondary to severe ascites and resulting abdominal distension  Hyponatremia  - due to cirrhosis and improved with diuresis and paracentesis  - chronic and secondary to volume status, third spacing, use of Lasix  - overall stable and better over the past 24 hours 122 --> 127 --> 129  --> 128   Severe malnutrition  - secondary to progressive liver  cirrhosis and FTT - continue 2 gram sodium diet  - nutrition consultations appreciated  Hepatitis C  - CMET overall stable -Positive viral load  Anemia of chronic disease  - no signs of active bleeding  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   Objective: Filed Vitals:   12/24/13 0544 12/24/13 1443 12/24/13 2100 12/25/13 0628  BP: 115/62 118/65 123/76 118/64  Pulse: 85 84 109 96  Temp: 98 F (36.7 C) 98.7 F (37.1 C) 98.6 F (37 C) 98.8 F (37.1 C)  TempSrc: Oral Oral Oral Oral  Resp: 18 16 16 16   Height:      Weight:      SpO2: 98% 98% 97% 95%    Intake/Output Summary (Last 24 hours) at 12/25/13 1116 Last data filed at 12/25/13 0100  Gross per 24 hour  Intake      0 ml  Output    900 ml  Net   -900 ml    Exam:   General:  Pt is alert, follows commands appropriately,  not in acute distress  Cardiovascular: Regular rate and rhythm, S1/S2, no murmurs, no rubs, no gallops  Respiratory: Clear to auscultation bilaterally, no wheezing, no crackles, no rhonchi  Abdomen: distended, bowel sounds present, no guarding  Extremities: upper and lower extremity edema, pulses DP and PT palpable bilaterally  Neuro: Grossly nonfocal  Data Reviewed: Basic Metabolic Panel:  Recent Labs Lab 12/21/13 0523 12/22/13 0557 12/23/13 0450 12/24/13 0520 12/25/13 0550  NA  129* 129* 128* 125* 126*  K 4.4 4.1 4.1 4.2 4.0  CL 95* 97 95* 94* 93*  CO2 27 25 24 26 21   GLUCOSE 108* 108* 83 100* 111*  BUN 15 14 13 13 14   CREATININE 0.96 0.82 0.86 0.89 0.77  CALCIUM 8.1* 8.0* 8.1* 8.1* 8.4   Liver Function Tests:  Recent Labs Lab 12/23/13 0450  AST 66*  ALT 36  ALKPHOS 107  BILITOT 1.1  PROT 5.8*  ALBUMIN 1.8*    Recent Labs Lab 12/19/13 0450  AMMONIA 28   CBC:  Recent Labs Lab 12/19/13 0450 12/20/13 0510 12/23/13 0450 12/24/13 0520 12/25/13 0550  WBC 6.8 5.4 6.9 7.9 12.6*  HGB 9.8* 10.6* 10.0* 9.8* 10.4*  HCT 29.7* 32.5* 29.8* 29.6* 30.7*  MCV 91.4 91.3 89.2 89.2 89.2  PLT 86* 96* 84* 89* 100*   Scheduled Meds: . albumin human  16 g Intravenous Once  . feeding supplement (ENSURE COMPLETE)  237 mL Oral TID WC  . folic acid  1 mg Oral Daily  . furosemide  40 mg Oral Daily  . lactulose  30 g Oral TID  . lidocaine  1 patch Transdermal Q24H  . LORazepam  0.5 mg Oral QHS,MR X 1  . multivitamin with minerals  1 tablet Oral Daily  . nicotine  14 mg Transdermal Daily  . pantoprazole  40 mg Oral BID  . rifaximin  550 mg Oral BID  . sodium chloride  3 mL Intravenous Q12H  . thiamine  100 mg Oral Daily   Continuous Infusions:   [name], MD  [facility] Pager [phone]  If 7PM-7AM, please contact night-coverage [email or web address] Password [email or web address] 12/25/2013, 11:16 AM   LOS: 15 days

## 2013-12-26 ENCOUNTER — Encounter (HOSPITAL_COMMUNITY): Payer: Self-pay | Admitting: Certified Registered Nurse Anesthetist

## 2013-12-26 ENCOUNTER — Inpatient Hospital Stay (HOSPITAL_COMMUNITY): Payer: Medicaid Other

## 2013-12-26 ENCOUNTER — Encounter (HOSPITAL_COMMUNITY): Payer: Medicaid Other | Admitting: Certified Registered Nurse Anesthetist

## 2013-12-26 ENCOUNTER — Ambulatory Visit (HOSPITAL_COMMUNITY): Admission: RE | Admit: 2013-12-26 | Payer: Medicaid Other | Source: Ambulatory Visit | Admitting: Diagnostic Radiology

## 2013-12-26 ENCOUNTER — Ambulatory Visit (HOSPITAL_COMMUNITY)
Admit: 2013-12-26 | Discharge: 2013-12-26 | Disposition: A | Discharge status: Home / Self Care | Payer: Medicaid Other | Attending: Gastroenterology | Admitting: Gastroenterology

## 2013-12-26 ENCOUNTER — Ambulatory Visit: Payer: Medicaid Other | Admitting: Nurse Practitioner

## 2013-12-26 ENCOUNTER — Inpatient Hospital Stay (HOSPITAL_COMMUNITY): Payer: Medicaid Other | Admitting: Certified Registered Nurse Anesthetist

## 2013-12-26 ENCOUNTER — Encounter (HOSPITAL_COMMUNITY)
Admission: EM | Disposition: A | Discharge status: Home / Self Care | Payer: Self-pay | Source: Home / Self Care | Attending: Internal Medicine

## 2013-12-26 HISTORY — PX: RADIOLOGY WITH ANESTHESIA: SHX6223

## 2013-12-26 LAB — COMPREHENSIVE METABOLIC PANEL
ALK PHOS: 104 U/L (ref 39–117)
ALT: 31 U/L (ref 0–53)
AST: 48 U/L — ABNORMAL HIGH (ref 0–37)
Albumin: 1.8 g/dL — ABNORMAL LOW (ref 3.5–5.2)
BUN: 10 mg/dL (ref 6–23)
CHLORIDE: 98 meq/L (ref 96–112)
CO2: 22 meq/L (ref 19–32)
CREATININE: 0.71 mg/dL (ref 0.50–1.35)
Calcium: 7.7 mg/dL — ABNORMAL LOW (ref 8.4–10.5)
GFR calc Af Amer: >90 mL/min (ref >90)
GLUCOSE: 88 mg/dL (ref 70–99)
POTASSIUM: 4.3 meq/L (ref 3.7–5.3)
Sodium: 129 mEq/L — ABNORMAL LOW (ref 137–147)
Total Bilirubin: 1.2 mg/dL (ref 0.3–1.2)
Total Protein: 5.9 g/dL — ABNORMAL LOW (ref 6.0–8.3)

## 2013-12-26 LAB — URINALYSIS, ROUTINE W REFLEX MICROSCOPIC
BILIRUBIN URINE: NEGATIVE
GLUCOSE, UA: NEGATIVE mg/dL
HGB URINE DIPSTICK: NEGATIVE
KETONES UR: NEGATIVE mg/dL
Leukocytes, UA: NEGATIVE
Nitrite: NEGATIVE
PROTEIN: NEGATIVE mg/dL
Specific Gravity, Urine: 1.014 (ref 1.005–1.030)
UROBILINOGEN UA: 1 mg/dL (ref 0.0–1.0)
pH: 7 (ref 5.0–8.0)

## 2013-12-26 LAB — CBC
HCT: 29.4 % — ABNORMAL LOW (ref 39.0–52.0)
Hemoglobin: 10.4 g/dL — ABNORMAL LOW (ref 13.0–17.0)
MCH: 30.7 pg (ref 26.0–34.0)
MCHC: 35.4 g/dL (ref 30.0–36.0)
MCV: 86.7 fL (ref 78.0–100.0)
Platelets: 96 10*3/uL — ABNORMAL LOW (ref 150–400)
RBC: 3.39 MIL/uL — ABNORMAL LOW (ref 4.22–5.81)
RDW: 16.9 % — ABNORMAL HIGH (ref 11.5–15.5)
WBC: 7.7 10*3/uL (ref 4.0–10.5)

## 2013-12-26 LAB — GLUCOSE, CAPILLARY
GLUCOSE-CAPILLARY: 101 mg/dL — AB (ref 70–99)
Glucose-Capillary: 96 mg/dL (ref 70–99)

## 2013-12-26 LAB — TYPE AND SCREEN
ABO/RH(D): O POS
ANTIBODY SCREEN: NEGATIVE

## 2013-12-26 LAB — TROPONIN I

## 2013-12-26 LAB — SURGICAL PCR SCREEN
MRSA, PCR: POSITIVE — AB
Staphylococcus aureus: POSITIVE — AB

## 2013-12-26 LAB — PROTIME-INR
INR: 1.86 — ABNORMAL HIGH (ref 0.00–1.49)
Prothrombin Time: 20.9 seconds — ABNORMAL HIGH (ref 11.6–15.2)

## 2013-12-26 LAB — PATHOLOGIST SMEAR REVIEW

## 2013-12-26 SURGERY — RADIOLOGY WITH ANESTHESIA
Anesthesia: General

## 2013-12-26 MED ORDER — NEOSTIGMINE METHYLSULFATE 1 MG/ML IJ SOLN
INTRAMUSCULAR | Status: DC | PRN
  Administered 2013-12-26: 5 mg via INTRAVENOUS

## 2013-12-26 MED ORDER — LACTATED RINGERS IV SOLN: INTRAVENOUS | Status: DC

## 2013-12-26 MED ORDER — HYDROMORPHONE HCL PF 1 MG/ML IJ SOLN
0.2500 mg | INTRAMUSCULAR | Status: DC | PRN
Start: 2013-12-26 — End: 2013-12-26

## 2013-12-26 MED ORDER — LACTATED RINGERS IV SOLN
INTRAVENOUS | Status: DC | PRN
  Administered 2013-12-26 (×2): via INTRAVENOUS

## 2013-12-26 MED ORDER — ROCURONIUM BROMIDE 100 MG/10ML IV SOLN
INTRAVENOUS | Status: DC | PRN
  Administered 2013-12-26: 50 mg via INTRAVENOUS
  Administered 2013-12-26: 20 mg via INTRAVENOUS
  Administered 2013-12-26: 10 mg via INTRAVENOUS

## 2013-12-26 MED ORDER — CEFAZOLIN SODIUM-DEXTROSE 2-3 GM-% IV SOLR
2.0000 g | Freq: Once | INTRAVENOUS | Status: AC
  Administered 2013-12-26: 2 g via INTRAVENOUS
  Filled 2013-12-26: qty 50

## 2013-12-26 MED ORDER — OXYCODONE HCL 5 MG PO TABS: 5.0000 mg | ORAL_TABLET | Freq: Once | ORAL | Status: DC | PRN

## 2013-12-26 MED ORDER — ONDANSETRON HCL 4 MG/2ML IJ SOLN
INTRAMUSCULAR | Status: DC | PRN
  Administered 2013-12-26: 4 mg via INTRAVENOUS

## 2013-12-26 MED ORDER — MIDAZOLAM HCL 5 MG/5ML IJ SOLN
INTRAMUSCULAR | Status: DC | PRN
  Administered 2013-12-26 (×2): 1 mg via INTRAVENOUS

## 2013-12-26 MED ORDER — ALBUMIN HUMAN 5 % IV SOLN
INTRAVENOUS | Status: DC | PRN
  Administered 2013-12-26 (×2): via INTRAVENOUS

## 2013-12-26 MED ORDER — IOHEXOL 300 MG/ML  SOLN
150.0000 mL | Freq: Once | INTRAMUSCULAR | Status: AC | PRN
  Administered 2013-12-26: 100 mL via INTRAVENOUS

## 2013-12-26 MED ORDER — GLYCOPYRROLATE 0.2 MG/ML IJ SOLN
INTRAMUSCULAR | Status: DC | PRN
  Administered 2013-12-26: .8 mg via INTRAVENOUS

## 2013-12-26 MED ORDER — OXYCODONE HCL 5 MG/5ML PO SOLN: 5.0000 mg | Freq: Once | ORAL | Status: DC | PRN

## 2013-12-26 MED ORDER — FENTANYL CITRATE 0.05 MG/ML IJ SOLN
INTRAMUSCULAR | Status: DC | PRN
  Administered 2013-12-26: 150 ug via INTRAVENOUS
  Administered 2013-12-26 (×2): 50 ug via INTRAVENOUS

## 2013-12-26 MED ORDER — DEXTROSE 5 % IV SOLN
10.0000 mg | INTRAVENOUS | Status: DC | PRN
  Administered 2013-12-26: 25 ug/min via INTRAVENOUS

## 2013-12-26 MED ORDER — PROPOFOL 10 MG/ML IV BOLUS
INTRAVENOUS | Status: DC | PRN
  Administered 2013-12-26: 150 mg via INTRAVENOUS

## 2013-12-26 MED ORDER — ONDANSETRON HCL 4 MG/2ML IJ SOLN: 4.0000 mg | Freq: Once | INTRAMUSCULAR | Status: DC | PRN

## 2013-12-26 MED ORDER — MEPERIDINE HCL 25 MG/ML IJ SOLN: 6.2500 mg | INTRAMUSCULAR | Status: DC | PRN

## 2013-12-26 NOTE — Procedures (Signed)
Post-Procedure Note  Pre-operative Diagnosis: Cirrhosis and recurrent ascites.       Post-operative Diagnosis:  Cirrhosis and portal hypertension   Indications: Recurrent ascites associated with respiratory distress  Procedure Details:   Informed consent was obtained.  Portal vein access was achieved from a right transhepatic approach.  Access was gained in the right internal jugular vein.  A tract was created between the hepatic venous system and the portal venous system.  A 10 mm x 8 mm Viatorr stent was placed and post dilated to 8 mm.  The TIPS is patent.  See Radiology report.  Findings:    Varices along the abdominal wall were cannulated but unable to reflux into portal venous system.   Portal venous system is patent.  TIPS created between right hepatic vein and central aspect of right portal vein.  Pressure gradient after TIPS creation was between 6-7 mmHg.     Complications: None     Condition: Stable  Plan: Extubate and recover in PACU.

## 2013-12-26 NOTE — Anesthesia Preprocedure Evaluation (Addendum)
Anesthesia Evaluation  Patient identified by MRN, date of birth, ID band Patient awake    Reviewed: Allergy & Precautions, H&P , NPO status , Patient's Chart, lab work & pertinent test results  Airway Mallampati: I TM Distance: >3 FB Neck ROM: Full    Dental  (+) Missing   Pulmonary Current Smoker,          Cardiovascular Rhythm:Regular Rate:Normal     Neuro/Psych    GI/Hepatic (+) Hepatitis -, C  Endo/Other  diabetes, Type 2  Renal/GU      Musculoskeletal   Abdominal   Peds  Hematology   Anesthesia Other Findings   Reproductive/Obstetrics                         Anesthesia Physical Anesthesia Plan  ASA: III  Anesthesia Plan: General   Post-op Pain Management:    Induction: Intravenous  Airway Management Planned: Oral ETT  Additional Equipment:   Intra-op Plan:   Post-operative Plan: Extubation in OR  Informed Consent: I have reviewed the patients History and Physical, chart, labs and discussed the procedure including the risks, benefits and alternatives for the proposed anesthesia with the patient or authorized representative who has indicated his/her understanding and acceptance.   Dental advisory given  Plan Discussed with: CRNA and Surgeon  Anesthesia Plan Comments:       Anesthesia Quick Evaluation

## 2013-12-26 NOTE — Progress Notes (Signed)
Daily Rounding Note  12/26/2013, 8:14 AM  LOS: 16 days   SUBJECTIVE:    Just returned from post op.  Had TIPS and 3 liters ascites removed.     Some mid sternal, left chest discomfort without sweats or dyspnea.  Hungry.  regular BMs, lst was this AM  OBJECTIVE:         Vital signs in last 24 hours:    Temp:  [98 F (36.7 C)-99 F (37.2 C)] 99 F (37.2 C) (02/26 0810) Pulse Rate:  [88-95] 90 (02/26 0810) Resp:  [16-18] 18 (02/26 0810) BP: (103-122)/(58-73) 103/58 mmHg (02/26 0810) SpO2:  [96 %-99 %] 96 % (02/26 0810) Weight:  [93.441 kg (206 lb)-94.575 kg (208 lb 8 oz)] 93.441 kg (206 lb) (02/26 0555) Last BM Date: 12/24/13 General: tired but alert and comfortable.  Ashen coloring   Heart: RRR. Chest: clear bil.  No dyspnea or cough Abdomen: soft, protuberant.  2 bandages in place at mid abdomen and right flank.  These are clean and dry.  No tenderness.  BS hypoactive.  Extremities: + edema in legs. Neuro/Psych:  Pleasant, no asterixis, appropriate, relaxed.   Intake/Output from previous day: 02/25 0701 - 02/26 0700 In: 355 [P.O.:355] Out: 2000 [Urine:2000]  Intake/Output this shift:    Lab Results:  Recent Labs  12/24/13 0520 12/25/13 0550  WBC 7.9 12.6*  HGB 9.8* 10.4*  HCT 29.6* 30.7*  PLT 89* 100*   BMET  Recent Labs  12/24/13 0520 12/25/13 0550  NA 125* 126*  K 4.2 4.0  CL 94* 93*  CO2 26 21  GLUCOSE 100* 111*  BUN 13 14  CREATININE 0.89 0.77  CALCIUM 8.1* 8.4   LFT No results found for this basename: PROT, ALBUMIN, AST, ALT, ALKPHOS, BILITOT, BILIDIR, IBILI,  in the last 72 hours PT/INR  Recent Labs  12/24/13 0520 12/25/13 0550  LABPROT 20.6* 19.9*  INR 1.83* 1.75*    Studies/Results: Dg Chest 2 View  12/25/2013   CLINICAL DATA:  Leukocytosis.  EXAM: CHEST  2 VIEW  COMPARISON:  DG CHEST 2 VIEW dated 12/21/2013; DG CHEST 2 VIEW dated 12/10/2013  FINDINGS: Mediastinum hilar  structures are normal. Mild basilar atelectasis noted. Previously identified infiltrate in the right lung base continues to clear with near complete clearing. Pleural effusions have almost completely cleared. No pneumothorax. Heart size and pulmonary vascularity normal. No acute osseous abnormality.  IMPRESSION: Mild basilar atelectasis. Near complete clearing of right base infiltrate and pleural effusions.   Electronically Signed   By: Marcello Moores  Register   On: 12/25/2013 15:57   [place] Art/ven Flow Abd Pelv Doppler  12/24/2013   CLINICAL DATA:  Cirrhosis, ascites  EXAM: DUPLEX ULTRASOUND OF LIVER  TECHNIQUE: Color and duplex Doppler ultrasound was performed to evaluate the hepatic in-flow and out-flow vessels.  COMPARISON:  12/21/2013, 12/10/2013  FINDINGS: Portal Vein Velocities  Main:  23 cm/sec  Right:  32 cm/sec  Left:  28 cm/sec  Hepatic Vein Velocities  Right:  25 cm/sec  Middle:  23 cm/sec  Left:  23 cm/sec  Hepatic Artery Velocity:  102 cm/sec  Splenic Vein Velocity:  25 cm/sec  Varices: Not visualized  Ascites: Present  Patent portal, hepatic and splenic veins. Normal directional flow. Hepatic cirrhosis noted. Negative for portal vein thrombus or occlusion. Diffuse abdominal ascites.  IMPRESSION: Patent portal, hepatic and splenic veins.  Hepatic cirrhosis  Ascites   Electronically Signed   By: Daryll Brod  M.D.   On: 12/24/2013 15:58   US Paracentesis  12/25/2013   CLINICAL DATA:  Hepatitis-C, cirrhosis, recurrent ascites. Request is made for diagnostic and therapeutic paracentesis.  EXAM: ULTRASOUND GUIDED DIAGNOSTIC AND THERAPEUTIC PARACENTESIS  COMPARISON:  PREVIOUS PARACENTESIS ON 12/22/2013.  PROCEDURE: An ultrasound guided paracentesis was thoroughly discussed with the patient and questions answered. The benefits, risks, alternatives and complications were also discussed. The patient understands and wishes to proceed with the procedure. Written consent was obtained.  Ultrasound was performed to  localize and mark an adequate pocket of fluid in the left lower quadrant of the abdomen. The area was then prepped and draped in the normal sterile fashion. 1% Lidocaine was used for local anesthesia. Under ultrasound guidance a 19 gauge Yueh catheter was introduced. Paracentesis was performed. The catheter was removed and a dressing applied.  Complications: None.  FINDINGS: A total of approximately 4.7 liters of turbid, light amber fluid was removed. A fluid sample was sent for laboratory analysis.  IMPRESSION: Successful ultrasound guided diagnostic and therapeutic paracentesis yielding 4.7 liters of ascites.  Read by: Rowe Robert ,P.A.-C.   Electronically Signed   By: Markus Daft M.D.   On: 12/25/2013 16:08   Scheduled Meds: .  ceFAZolin (ANCEF) IV  2 g Intravenous Once  . feeding supplement (ENSURE COMPLETE)  237 mL Oral TID WC  . folic acid  1 mg Oral Daily  . furosemide  40 mg Oral Daily  . lactulose  30 g Oral TID  . lidocaine  1 patch Transdermal Q24H  . LORazepam  0.5 mg Oral QHS,MR X 1  . multivitamin with minerals  1 tablet Oral Daily  . nicotine  14 mg Transdermal Daily  . pantoprazole  40 mg Oral BID  . rifaximin  550 mg Oral BID  . sodium chloride  3 mL Intravenous Q12H  . thiamine  100 mg Oral Daily   Continuous Infusions:  PRN Meds:.alum & mag hydroxide-simeth, ondansetron (ZOFRAN) IV, ondansetron, oxyCODONE   ASSESMENT:   *  Hep C, cirrhosis  *  Ascites, s/p multiple large volume paracentesis.  Diuretic management hampered by hyponatremia.  S/p TIPS and another 3 litre paracentesis today. Currently on Lasix 40 mg/day.   *  Coagulopathy.  Received 2 units FFP today to allow for TIPS.   *  Thrombocytopenia. Stable.   *  UGIB January 2015 (hemetemesis, blood per rectum) .  S/p EGD 11/08/13: 2 trunks medium sized varices without stigmata treated with band ligation x 5.  Old clot in stomach. 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    *  Schizophrenia dating to pt's early  50s. AMS during January admission.  Ammonia into 70s, now 28, on lactulose and Rifaxamin.    PLAN   *  Continue lactulose and Rifaximin.  Continue Lasix. Daily weights. 2 gm Na diet. AM chemistries, CBC.    Azucena Freed  12/26/2013, 8:14 AM Pager: [phone]    Attending physician's note   I have taken an interval history, examined the patient and reviewed the chart. I agree with the Advanced Practitioner's note, impression and recommendations. Successful TIPS today. Ascites fluid from paracentesis yesterday did not show evidence of SBP. Closely monitor fluid balance changes with anticipated diuresis post TIPS. Monitor CMET and monitor for encephalopathy. Probably will be able to dc Lasix soon.   Ladene Artist, MD [facility]

## 2013-12-26 NOTE — Preoperative (Signed)
Beta Blockers   Reason not to administer Beta Blockers:Not Applicable 

## 2013-12-26 NOTE — Progress Notes (Addendum)
Patient ID: Oscar Swanson, male   DOB: 1961/07/08, 53 y.o.   MRN: [id number]  [facility] PROGRESS NOTE  SUNDEEP DESTIN WUJ:[id number] DOB: 1961/10/30 DOA: 12/10/2013 PCP: Angelica Chessman, MD  Brief narrative:  53 y.o. male with a past medical history of alcohol/HCV cirrhosis, hepatic encephalopathy, intractible ascitis, esophageal varices, chronic thrombocytopenia who presented with complaints of abdominal distention and shortness of breath. He was seen at GI clinic initially and was recommended for admission for tense ascites. Had paracentesis on 12/21/13 with removal of 6L of ascitic fluid. Given respiratory compromise from rapidly accumulating ascitis, GI recommending TIPS. Doppler US showing patent vessels, plan to undergo TIPS Procedure on Thursday 12/26/13. Interventional radiology  And GI recommending transfer to [facility]. Case discussed with Dr Candiss Norse of Internal Medicine who agreed to accept patient in transfer.     Principal Problem:    Ascites secondary to history of alcohol abuse  - abdomen still distended, last paracentesis 2/19 and 6 L fluid removed  - total of 24 liters of ascites removed (once on 2/11, second on 2/13, third 2/19, fourth on 2/21)  - Interventional radiology consulted, plan for TIPS procedure done on Thurs, will monitor mental status closely. - continue Lasix, spironolactone , Xifaxan -GI following the patient     Esophageal varices with bleeding  - currently stable  - continue Protonix for now  - appreciate GI input  -TIPS done on 2-26     Acute hypoxic respiratory failure  - secondary to PNA - completed therapy with Levaquin for 7 days  - shortness of breath at this point secondary to severe ascites and resulting abdominal distension      Hyponatremia  - due to cirrhosis and improved with diuresis and paracentesis  - chronic and secondary to volume status, third spacing, use of Lasix  - overall stable and better over the  past few days     Severe malnutrition  - secondary to progressive liver cirrhosis and FTT - continue 2 gram sodium diet  - nutrition consultations appreciated      Hepatitis C  - CMET overall stable -Positive viral load  GI following     Anemia of chronic disease  - no signs of active bleeding      Thrombocytopenia  - secondary to alcohol induced bone marrow damage  - no signs of active bleeding      Consultants:  IR for US guided paracentesis  GI     Procedures/Studies:  02/11 --> US guided paracentesis, 6 L ascites removed  02/13 --> US guided paracentesis, 6 L ascites removed  02/19 --> US guided paracentesis, 6 L ascites removed  02/21 --> US guided paracentesis, 6 L ascites removed 2/26 - TIPs - Varices along the abdominal wall were cannulated but unable to reflux into portal venous system. Portal venous system is patent. TIPS created between right hepatic vein and central aspect of right portal vein. Pressure gradient after TIPS creation was between 6-7 mmHg.    Antibiotics:  Levaquin 2/12 --> 2/18     Code Status: Full  Family Communication: Pt at bedside  Disposition Plan: Home when medically stable, not ready yet for discharge     HPI/Subjective:  Patient in PACU, he denies any headache, no chest abdominal pain. No shortness of breath.    Objective: Filed Vitals:   12/25/13 2330 12/26/13 0555 12/26/13 0810 12/26/13 1430  BP: 116/73 112/64 103/58 117/63  Pulse: 90 93 90   Temp: 98.2 F (36.8 C)  98.6 F (37 C) 99 F (37.2 C) 98 F (36.7 C)  TempSrc: Oral Oral Oral   Resp: 18 18 18 19   Height:      Weight:  93.441 kg (206 lb)    SpO2: 99% 98% 96% 98%    Intake/Output Summary (Last 24 hours) at 12/26/13 1508 Last data filed at 12/26/13 1356  Gross per 24 hour  Intake   2580 ml  Output   2100 ml  Net    480 ml    Exam:   General:  Pt is alert, follows commands appropriately, not in acute distress  Cardiovascular:  Regular rate and rhythm, S1/S2, no murmurs, no rubs, no gallops  Respiratory: Clear to auscultation bilaterally, no wheezing, no crackles, no rhonchi  Abdomen: Soft but distended, bowel sounds present, no guarding, Foley in place  Extremities: upper and lower extremity edema, pulses DP and PT palpable bilaterally  Neuro: Grossly nonfocal    Data Reviewed:  Basic Metabolic Panel:   Recent Labs Lab 12/22/13 0557 12/23/13 0450 12/24/13 0520 12/25/13 0550 12/26/13 0846  NA 129* 128* 125* 126* 129*  K 4.1 4.1 4.2 4.0 4.3  CL 97 95* 94* 93* 98  CO2 25 24 26 21 22   GLUCOSE 108* 83 100* 111* 88  BUN 14 13 13 14 10   CREATININE 0.82 0.86 0.89 0.77 0.71  CALCIUM 8.0* 8.1* 8.1* 8.4 7.7*   Liver Function Tests:  Recent Labs Lab 12/23/13 0450 12/26/13 0846  AST 66* 48*  ALT 36 31  ALKPHOS 107 104  BILITOT 1.1 1.2  PROT 5.8* 5.9*  ALBUMIN 1.8* 1.8*   No results found for this basename: AMMONIA,  in the last 168 hours CBC:  Recent Labs Lab 12/20/13 0510 12/23/13 0450 12/24/13 0520 12/25/13 0550 12/26/13 0846  WBC 5.4 6.9 7.9 12.6* 7.7  HGB 10.6* 10.0* 9.8* 10.4* 10.4*  HCT 32.5* 29.8* 29.6* 30.7* 29.4*  MCV 91.3 89.2 89.2 89.2 86.7  PLT 96* 84* 89* 100* 96*   Scheduled Meds: . [MAR HOLD] feeding supplement (ENSURE COMPLETE)  237 mL Oral TID WC  . [facility] HOLD] folic acid  1 mg Oral Daily  . [facility] HOLD] furosemide  40 mg Oral Daily  . [MAR HOLD] lactulose  30 g Oral TID  . [facility] HOLD] lidocaine  1 patch Transdermal Q24H  . [MAR HOLD] LORazepam  0.5 mg Oral QHS,MR X 1  . [MAR HOLD] multivitamin with minerals  1 tablet Oral Daily  . [facility] HOLD] nicotine  14 mg Transdermal Daily  . [MAR HOLD] pantoprazole  40 mg Oral BID  . [facility] HOLD] rifaximin  550 mg Oral BID  . [MAR HOLD] sodium chloride  3 mL Intravenous Q12H  . [facility] HOLD] thiamine  100 mg Oral Daily   Continuous Infusions:   Thurnell Lose, MD   Pager [phone]  If 7PM-7AM, please contact  night-coverage [email or web address] Password [email or web address] 12/26/2013, 3:08 PM   LOS: 16 days

## 2013-12-26 NOTE — Progress Notes (Signed)
Pt arrive to unit in no s/s of distress via [facility]. Report received from dayshift nurse - dayshift nurse receive report prior to pt's arrival to unit. Pt oriented to floor. Whiteboard updated. Callbell within reach. Will continue to monitor. VS stable.

## 2013-12-26 NOTE — Progress Notes (Signed)
Subjective: Patient is s/p TIPS procedure today, currently extubated and in his room with his family. He denies any abdominal pain, he does admit to some central chest "congestion" he states he had noticed this earlier, this discomfort is non-radiating and not associated with diaphoresis. He states overall he feels "good."   Objective: Physical Exam: BP 110/61  Pulse 91  Temp(Src) 98.1 F (36.7 C) (Oral)  Resp 23  Ht 5\' 8"  (1.727 m)  Wt 206 lb (93.441 kg)  BMI 31.33 kg/m2  SpO2 98%  General: A&Ox3, NAD, extubated, sitting up in bed eating ice chips.  Abd: Soft, ND, NT, procedure dressings C/D/I, no signs of bleeding or hematoma.   Labs: CBC  Recent Labs  12/25/13 0550 12/26/13 0846  WBC 12.6* 7.7  HGB 10.4* 10.4*  HCT 30.7* 29.4*  PLT 100* 96*   BMET  Recent Labs  12/25/13 0550 12/26/13 0846  NA 126* 129*  K 4.0 4.3  CL 93* 98  CO2 21 22  GLUCOSE 111* 88  BUN 14 10  CREATININE 0.77 0.71  CALCIUM 8.4 7.7*   LFT  Recent Labs  12/26/13 0846  PROT 5.9*  ALBUMIN 1.8*  AST 48*  ALT 31  ALKPHOS 104  BILITOT 1.2   PT/INR  Recent Labs  12/25/13 0550 12/26/13 0846  LABPROT 19.9* 20.9*  INR 1.75* 1.86*     Studies/Results: Dg Chest 2 View  12/25/2013   CLINICAL DATA:  Leukocytosis.  EXAM: CHEST  2 VIEW  COMPARISON:  DG CHEST 2 VIEW dated 12/21/2013; DG CHEST 2 VIEW dated 12/10/2013  FINDINGS: Mediastinum hilar structures are normal. Mild basilar atelectasis noted. Previously identified infiltrate in the right lung base continues to clear with near complete clearing. Pleural effusions have almost completely cleared. No pneumothorax. Heart size and pulmonary vascularity normal. No acute osseous abnormality.  IMPRESSION: Mild basilar atelectasis. Near complete clearing of right base infiltrate and pleural effusions.   Electronically Signed   By: Marcello Moores  Register   On: 12/25/2013 15:57   US Paracentesis  12/25/2013   CLINICAL DATA:  Hepatitis-C, cirrhosis,  recurrent ascites. Request is made for diagnostic and therapeutic paracentesis.  EXAM: ULTRASOUND GUIDED DIAGNOSTIC AND THERAPEUTIC PARACENTESIS  COMPARISON:  PREVIOUS PARACENTESIS ON 12/22/2013.  PROCEDURE: An ultrasound guided paracentesis was thoroughly discussed with the patient and questions answered. The benefits, risks, alternatives and complications were also discussed. The patient understands and wishes to proceed with the procedure. Written consent was obtained.  Ultrasound was performed to localize and mark an adequate pocket of fluid in the left lower quadrant of the abdomen. The area was then prepped and draped in the normal sterile fashion. 1% Lidocaine was used for local anesthesia. Under ultrasound guidance a 19 gauge Yueh catheter was introduced. Paracentesis was performed. The catheter was removed and a dressing applied.  Complications: None.  FINDINGS: A total of approximately 4.7 liters of turbid, light amber fluid was removed. A fluid sample was sent for laboratory analysis.  IMPRESSION: Successful ultrasound guided diagnostic and therapeutic paracentesis yielding 4.7 liters of ascites.  Read by: Rowe Robert ,P.A.-C.   Electronically Signed   By: Markus Daft M.D.   On: 12/25/2013 16:08   Ir Tips  12/26/2013   CLINICAL DATA:  53 year old with cirrhosis, hepatitis C and recurrent ascites. Patient has respiratory distress associated with the ascites.  EXAM: TRANSJUGULAR INTRAHEPATIC PORTOSYSTEMIC SHUNT; IR PARACENTESIS  Physician: Stephan Minister. Anselm Pancoast, MD and Jacqulynn Cadet, MD  MEDICATIONS: General anesthesia  ANESTHESIA/SEDATION: The patient was monitored  by anesthesia throughout the procedure  CONTRAST:  100 mL Omnipaque 300  FLUOROSCOPY TIME:  18 min and 18 seconds  PROCEDURE: The procedure was explained to the patient. The risks and benefits of the procedure were discussed and the patient's questions were addressed. Informed consent was obtained from the patient. The patient received 2 units of  FFP for this procedure. The patient was intubated and placed under general anesthesia. The abdomen was evaluated with ultrasound. Patient was found to have perihepatic ascites. The abdomen was prepped and draped in sterile fashion. Maximal barrier sterile technique was utilized including caps, mask, sterile gowns, sterile gloves, sterile drape, hand hygiene and skin antiseptic. A Yeuh catheter was directed into the perihepatic fluid with ultrasound guidance. A paracentesis was performed. Approximately 3 L of blood-tinged fluid was removed during the procedure.  Attention was directed to accessing the portal venous system. The patient has known varices along the anterior abdomen that communicate with the left portal venous system. Prominent varices were identified in the anterior abdominal wall with ultrasound. Using sterile technique, a 21 gauge needle was directed into 1 of these varices with ultrasound guidance. A micropuncture dilator set was placed. Venography was performed. The contrast did not reflux into the portal venous system. A branch more caudal was targeted with ultrasound guidance. A 21 gauge needle was directed into this vein with ultrasound guidance and a micropuncture dilator was placed. Additional venography was performed. Unable to cannulate the left portal venous system with a 0.018 wire. As result, these catheters were left in place and attention was directed to achieve transhepatic portal venous access. Using ultrasound guidance, right portal vein was identified with ultrasound. A 22 gauge Chiba needle was directed into the right portal vein with ultrasound guidance. Contrast injection confirmed placement in the portal venous system. A wire was advanced into the main portal vein. A 3 French catheter was placed with a radiopaque wire within it. This catheter was secured to the skin.  The right side of the neck was prepped and draped in sterile fashion. Using ultrasound guidance, a 21 gauge  needle was directed into the right internal jugular vein with ultrasound guidance. A micropuncture dilator set was placed and upsized to a 10 [place] sheath. A MPV catheter was used to cannulate a prominent hepatic vein which probably represents the middle hepatic vein. Using the 3 French portal vein catheter as a target, the TIPS needle was directed towards the central aspect of the right portal vein with fluoroscopic guidance. Contrast injection confirmed placement within the portal venous system. A hydrophilic wire was advanced into the portal venous system. Catheter and Amplatz wire with advanced into the portal venous system. Pigtail catheter was placed for a portal venogram. The tract was dilated with an 5 mm x 40 mm balloon. A 10 mm x 80 mm (60 mm covered) Viatorr stent was selected. The stent was advanced into the main portal vein. The uncovered portion was positioned in the portal venous system. The remainder of the stent was deployed. The stent was post dilated with an 8 mm x 40 mm balloon. Portal vein and systemic pressures were obtained. The 10 French vascular sheath was removed with manual compression. The transhepatic portal vein catheter, paracentesis catheter and anterior abdominal catheters were removed with manual compression. The patient was transferred to the PACU for recovery.  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 We  attempted to use these veins as access to the portal venous system. The venography demonstrated that the flow was away from the liver and we were unable to reflux contrast into the portal venous system or cannulate the portal venous system with a wire. Right portal vein access was successfully achieved with a transhepatic approach using ultrasound and fluoroscopic guidance. The TIPS was created between a prominent hepatic vein (probably the middle hepatic  vein) and the central aspect of the right portal vein. The TIPS stent is widely patent. The post-procedure portal vein pressure was 21 mmHg. The post TIPS pressure in the hepatic veins and the right atrium was between 14-15 mmHg. Therefore, the pressure gradient after TIPS was between 6-7 mmHg.  IMPRESSION: Successful TIPS procedure.   Electronically Signed   By: Markus Daft M.D.   On: 12/26/2013 15:21   Dg Chest Port 1 View  12/26/2013   CLINICAL DATA:  Cough, smoker  EXAM: PORTABLE CHEST - 1 VIEW  COMPARISON:  12/25/2013  FINDINGS: Cardiomediastinal silhouette is stable. Mild basilar streaky atelectasis. Minimal central bronchitic changes. No focal infiltrate. No pulmonary edema.  IMPRESSION: Mild basilar streaky atelectasis. No segmental infiltrate. No pulmonary edema.   Electronically Signed   By: Lahoma Crocker M.D.   On: 12/26/2013 08:18   Ir Paracentesis  12/26/2013   CLINICAL DATA:  53 year old with cirrhosis, hepatitis C and recurrent ascites. Patient has respiratory distress associated with the ascites.  EXAM: TRANSJUGULAR INTRAHEPATIC PORTOSYSTEMIC SHUNT; IR PARACENTESIS  Physician: Stephan Minister. Anselm Pancoast, MD and Jacqulynn Cadet, MD  MEDICATIONS: General anesthesia  ANESTHESIA/SEDATION: The patient was monitored by anesthesia throughout the procedure  CONTRAST:  100 mL Omnipaque 300  FLUOROSCOPY TIME:  18 min and 18 seconds  PROCEDURE: The procedure was explained to the patient. The risks and benefits of the procedure were discussed and the patient's questions were addressed. Informed consent was obtained from the patient. The patient received 2 units of FFP for this procedure. The patient was intubated and placed under general anesthesia. The abdomen was evaluated with ultrasound. Patient was found to have perihepatic ascites. The abdomen was prepped and draped in sterile fashion. Maximal barrier sterile technique was utilized including caps, mask, sterile gowns, sterile gloves, sterile drape, hand hygiene and  skin antiseptic. A Yeuh catheter was directed into the perihepatic fluid with ultrasound guidance. A paracentesis was performed. Approximately 3 L of blood-tinged fluid was removed during the procedure.  Attention was directed to accessing the portal venous system. The patient has known varices along the anterior abdomen that communicate with the left portal venous system. Prominent varices were identified in the anterior abdominal wall with ultrasound. Using sterile technique, a 21 gauge needle was directed into 1 of these varices with ultrasound guidance. A micropuncture dilator set was placed. Venography was performed. The contrast did not reflux into the portal venous system. A branch more caudal was targeted with ultrasound guidance. A 21 gauge needle was directed into this vein with ultrasound guidance and a micropuncture dilator was placed. Additional venography was performed. Unable to cannulate the left portal venous system with a 0.018 wire. As result, these catheters were left in place and attention was directed to achieve transhepatic portal venous access. Using ultrasound guidance, right portal vein was identified with ultrasound. A 22 gauge Chiba needle was directed into the right portal vein with ultrasound guidance. Contrast injection confirmed placement in the portal venous system. A wire was advanced into the main portal vein. A 3 French catheter was placed  with a radiopaque wire within it. This catheter was secured to the skin.  The right side of the neck was prepped and draped in sterile fashion. Using ultrasound guidance, a 21 gauge needle was directed into the right internal jugular vein with ultrasound guidance. A micropuncture dilator set was placed and upsized to a 10 [place] sheath. A MPV catheter was used to cannulate a prominent hepatic vein which probably represents the middle hepatic vein. Using the 3 French portal vein catheter as a target, the TIPS needle was directed towards the  central aspect of the right portal vein with fluoroscopic guidance. Contrast injection confirmed placement within the portal venous system. A hydrophilic wire was advanced into the portal venous system. Catheter and Amplatz wire with advanced into the portal venous system. Pigtail catheter was placed for a portal venogram. The tract was dilated with an 5 mm x 40 mm balloon. A 10 mm x 80 mm (60 mm covered) Viatorr stent was selected. The stent was advanced into the main portal vein. The uncovered portion was positioned in the portal venous system. The remainder of the stent was deployed. The stent was post dilated with an 8 mm x 40 mm balloon. Portal vein and systemic pressures were obtained. The 10 French vascular sheath was removed with manual compression. The transhepatic portal vein catheter, paracentesis catheter and anterior abdominal catheters were removed with manual compression. The patient was transferred to the PACU for recovery.  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 We attempted to use these veins as access to the portal venous system. The venography demonstrated that the flow was away from the liver and we were unable to reflux contrast into the portal venous system or cannulate the portal venous system with a wire. Right portal vein access was successfully achieved with a transhepatic approach using ultrasound and fluoroscopic guidance. The TIPS was created between a prominent hepatic vein (probably the middle hepatic vein) and the central aspect of the right portal vein. The TIPS stent is widely patent. The post-procedure portal vein pressure was 21 mmHg. The post TIPS pressure in the hepatic veins and the right atrium was between 14-15 mmHg. Therefore, the pressure gradient after TIPS was between 6-7 mmHg.  IMPRESSION: Successful TIPS procedure.   Electronically Signed   By:  Markus Daft M.D.   On: 12/26/2013 15:21    Assessment/Plan: Alcohol/HCV Cirrhosis with recurrent ascites s/p TIPS procedure 2/26, follow CMP. Portal Hypertension. Encephalopathy, stable on lactulose and Rifaximin, last BM last evening, follow ammonia. Coagulopathy, INR 1.86 s/p 2U FFP today, follow labs. Thrombocytopenia, plt 96 today.  IR will see patient 2/27 and follow labs. D/C foley and advance diet as tolerated. Patient to follow up with Dr. Anselm Pancoast in clinic 1 month.   LOS: 16 days    Rockney Ghee 12/26/2013 4:16 PM

## 2013-12-26 NOTE — Transfer of Care (Signed)
Immediate Anesthesia Transfer of Care Note  Patient: Oscar Swanson  Procedure(s) Performed: Procedure(s): Transjugular Intrahepatic Portosystemic Shunt (TIPS) (N/A)  Patient Location: PACU  Anesthesia Type:General  Level of Consciousness: awake, alert  and oriented  Airway & Oxygen Therapy: Patient Spontanous Breathing  Post-op Assessment: Report given to PACU RN  Post vital signs: Reviewed and stable  Complications: No apparent anesthesia complications

## 2013-12-26 NOTE — Progress Notes (Signed)
Pressures Post Stent Placement for TIPS procedure  Portal Vein  21 Rt Hepatic  14 Rt Atrium  14

## 2013-12-27 ENCOUNTER — Inpatient Hospital Stay (HOSPITAL_COMMUNITY): Payer: Medicaid Other

## 2013-12-27 LAB — PREPARE FRESH FROZEN PLASMA
UNIT DIVISION: 0
UNIT DIVISION: 0

## 2013-12-27 LAB — CBC
HCT: 27.8 % — ABNORMAL LOW (ref 39.0–52.0)
Hemoglobin: 9.5 g/dL — ABNORMAL LOW (ref 13.0–17.0)
MCH: 30.4 pg (ref 26.0–34.0)
MCHC: 34.2 g/dL (ref 30.0–36.0)
MCV: 89.1 fL (ref 78.0–100.0)
Platelets: 87 10*3/uL — ABNORMAL LOW (ref 150–400)
RBC: 3.12 MIL/uL — ABNORMAL LOW (ref 4.22–5.81)
RDW: 17.1 % — AB (ref 11.5–15.5)
WBC: 6.1 10*3/uL (ref 4.0–10.5)

## 2013-12-27 LAB — COMPREHENSIVE METABOLIC PANEL
ALT: 27 U/L (ref 0–53)
AST: 42 U/L — ABNORMAL HIGH (ref 0–37)
Albumin: 2 g/dL — ABNORMAL LOW (ref 3.5–5.2)
Alkaline Phosphatase: 96 U/L (ref 39–117)
BILIRUBIN TOTAL: 1.2 mg/dL (ref 0.3–1.2)
BUN: 9 mg/dL (ref 6–23)
CHLORIDE: 99 meq/L (ref 96–112)
CO2: 24 mEq/L (ref 19–32)
CREATININE: 0.69 mg/dL (ref 0.50–1.35)
Calcium: 8.1 mg/dL — ABNORMAL LOW (ref 8.4–10.5)
GFR calc Af Amer: >90 mL/min (ref >90)
GFR calc non Af Amer: >90 mL/min (ref >90)
Glucose, Bld: 110 mg/dL — ABNORMAL HIGH (ref 70–99)
Potassium: 4.3 mEq/L (ref 3.7–5.3)
Sodium: 130 mEq/L — ABNORMAL LOW (ref 137–147)
TOTAL PROTEIN: 5.8 g/dL — AB (ref 6.0–8.3)

## 2013-12-27 LAB — PROTIME-INR
INR: 1.91 — ABNORMAL HIGH (ref 0.00–1.49)
Prothrombin Time: 21.3 seconds — ABNORMAL HIGH (ref 11.6–15.2)

## 2013-12-27 LAB — URINE CULTURE
COLONY COUNT: NO GROWTH
CULTURE: NO GROWTH

## 2013-12-27 LAB — AMMONIA: AMMONIA: 60 umol/L (ref 11–60)

## 2013-12-27 LAB — TROPONIN I: Troponin I: <0.3 ng/mL (ref <0.30)

## 2013-12-27 MED ORDER — CHLORHEXIDINE GLUCONATE CLOTH 2 % EX PADS
6.0000 | MEDICATED_PAD | Freq: Every day | CUTANEOUS | Status: DC
  Administered 2013-12-27 – 2013-12-30 (×4): 6 via TOPICAL

## 2013-12-27 MED ORDER — MUPIROCIN 2 % EX OINT
1.0000 "application " | TOPICAL_OINTMENT | Freq: Two times a day (BID) | CUTANEOUS | Status: DC
  Administered 2013-12-27 – 2013-12-30 (×7): 1 via NASAL
  Filled 2013-12-27 (×2): qty 22

## 2013-12-27 NOTE — Evaluation (Signed)
Physical Therapy Evaluation Patient Details Name: Oscar Swanson MRN: [id number] DOB: 1961-03-27 Today's Date: 12/27/2013 Time: 0865-7846 PT Time Calculation (min): 18 min  PT Assessment / Plan / Recommendation History of Present Illness  53 yo male admitted with massive ascites, SOB. Hx of cirrhosis, hep encephalopathy, schizonphrenia, DM. Lives alone in apt. S/P multiple paracentesis procedures, and TIPS procedure 12/26/13.   Clinical Impression  *Pt admitted with massive ascites, pt is s/p multiple paracentesis procedures, and TIPs procedure yesterday*. Pt currently with functional limitations due to the deficits listed below (see PT Problem List).  Pt will benefit from skilled PT to increase their independence and safety with mobility to allow discharge to the venue listed below.    **    PT Assessment  Patient needs continued PT services    Follow Up Recommendations  Home health PT    Does the patient have the potential to tolerate intense rehabilitation      Barriers to Discharge   Pt lived alone PTA, stated he plans to DC to sister's home where he'll have 24* assist. This needs to be confirmed.    Equipment Recommendations  Rolling walker with 5" wheels    Recommendations for Other Services     Frequency Min 3X/week    Precautions / Restrictions Precautions Precautions: Fall Restrictions Weight Bearing Restrictions: No   Pertinent Vitals/Pain *No c/o pain**      Mobility  Bed Mobility Overal bed mobility: Modified Independent Bed Mobility: Sidelying to Sit;Rolling Rolling: Modified independent (Device/Increase time) Sidelying to sit: Modified independent (Device/Increase time) General bed mobility comments: HOB elevated, pt used rail Transfers Overall transfer level: Needs assistance Equipment used: Rolling walker (2 wheeled) Transfers: Sit to/from Stand Sit to Stand: Supervision General transfer comment: VCs hand  placement Ambulation/Gait Ambulation/Gait assistance: Supervision Ambulation Distance (Feet): 330 Feet Assistive device: Rolling walker (2 wheeled) Gait Pattern/deviations: Trunk flexed;Step-through pattern Gait velocity interpretation: at or above normal speed for age/gender General Gait Details: VCs for positioning in RW, tends to walk too far behind RW.     Exercises     PT Diagnosis: Difficulty walking;Generalized weakness  PT Problem List: Decreased activity tolerance;Decreased mobility;Decreased balance;Decreased knowledge of use of DME PT Treatment Interventions: DME instruction;Gait training;Functional mobility training;Therapeutic activities;Therapeutic exercise;Patient/family education;Balance training     PT Goals(Current goals can be found in the care plan section) Acute Rehab PT Goals Patient Stated Goal: to walk more PT Goal Formulation: With patient Time For Goal Achievement: 01/10/14 Potential to Achieve Goals: Good  Visit Information  Last PT Received On: 12/27/13 Assistance Needed: +1 History of Present Illness: 53 yo male admitted with massive ascites, SOB. Hx of cirrhosis, hep encephalopathy, schizonphrenia, DM. Lives alone in apt. S/P multiple paracentesis procedures, and TIPS procedure 12/26/13.        Prior [place] expects to be discharged to:: Private residence (Pt states he plans to DC to his twin sister's home. She is a retired Marine scientist. He lived alone PTA. ) Living Arrangements: Other relatives Available Help at Discharge: Family;Available 24 hours/day Home Equipment: Cane - single point;Walker - 2 wheels Prior Function Level of Independence: Independent with assistive device(s) Comments: sister drives him to the grocery store and for appointments Communication Communication: No difficulties    Cognition  Cognition Arousal/Alertness: Awake/alert Behavior During Therapy: WFL for tasks assessed/performed Overall  Cognitive Status: Within Functional Limits for tasks assessed    Extremity/Trunk Assessment Upper Extremity Assessment Upper Extremity Assessment: Overall WFL for tasks assessed Lower Extremity  Assessment Lower Extremity Assessment: Overall WFL for tasks assessed Cervical / Trunk Assessment Cervical / Trunk Assessment: Normal   Balance Balance Overall balance assessment: Needs assistance Sitting-balance support: No upper extremity supported;Feet supported Sitting balance-Leahy Scale: Good Standing balance-Leahy Scale: Fair  End of Session PT - End of Session Activity Tolerance: Patient tolerated treatment well Patient left: in chair;with call bell/phone within reach Nurse Communication: Mobility status  GP     Oscar Swanson 12/27/2013, 1:26 PM [phone]

## 2013-12-27 NOTE — Progress Notes (Signed)
Agree with PA note.  Pt seen and examined by me this evening.  No evidence of encephalopathy, pt appears to be at his baseline.  Right neck access site healing well, no belly pain. Transhepatic access site clean and dry.  Complains of mild pain at the jugular access site and requests something for pain.  Will offer tramadol.   Labs look excellent today, no signs of liver failure or other complication.  Will continue to follow while admitted.  Discharge per primary team.  In regards to the TIPS, pt has done well and there are no barriers to DC from our procedure.  Pt will follow up with Dr, Anselm Pancoast in clinic in 6 weeks.  Signed,  Criselda Peaches, MD [facility]

## 2013-12-27 NOTE — Progress Notes (Signed)
[facility]  Patient Status: Active (receiving services up to time of hospitalization)  [facility] is providing the following services: RN and MSW  If patient discharges after hours, please call [phone].   Oscar Swanson 12/27/2013, 11:51 AM

## 2013-12-27 NOTE — Clinical Social Work Note (Signed)
Per CSW handoff patient is still planning to return home at [place] signing off at this time. RNCM aware of equipment needed at home.  Liz Beach, [place], [place], [phone]

## 2013-12-27 NOTE — Progress Notes (Signed)
Clinical Social Work  Patient transferred from [facility] to [facility]. CSW Dory Horn provided handoff for CSW Megan Salon to assist with DC needs.  [place], [place] [phone]

## 2013-12-27 NOTE — Anesthesia Postprocedure Evaluation (Signed)
Anesthesia Post Note  Patient: Oscar Swanson  Procedure(s) Performed: Procedure(s) (LRB): Transjugular Intrahepatic Portosystemic Shunt (TIPS) (N/A)  Anesthesia type: general  Patient location: PACU  Post pain: Pain level controlled  Post assessment: Patient's Cardiovascular Status Stable  Last Vitals:  Filed Vitals:   12/27/13 0552  BP: 111/66  Pulse: 88  Temp: 36.9 C  Resp: 18    Post vital signs: Reviewed and stable  Level of consciousness: sedated  Complications: No apparent anesthesia complications

## 2013-12-27 NOTE — Care Management Note (Addendum)
    Page 1 of 2   12/30/2013     6:17:31 PM   CARE MANAGEMENT NOTE 12/30/2013  Patient:  CHRISTINA, WALDROP   Account Number:  [id number]  Date Initiated:  12/11/2013  Documentation initiated by:  [facility]  Subjective/Objective Assessment:   53 year old male admitted with SOB, pneumonia and edema.  patient is active with [facility] for [facility], and social work.     Action/Plan:   From hme.   Anticipated DC Date:  12/30/2013   Anticipated DC Plan:  [place]  CM consult      [facility] Choice  Resumption Of Svcs/PTA Provider   Choice offered to / List presented to:  C-1 Patient   DME arranged  [place]      DME agency  [place] arranged  HH-1 RN  [place]      [facility] agency  [place].   Status of service:  Completed, signed off Medicare Important Message given?   (If response is "NO", the following Medicare IM given date fields will be blank) Date Medicare IM given:   Date Additional Medicare IM given:    Discharge Disposition:  [place]  Per UR Regulation:  Reviewed for med. necessity/level of care/duration of stay  If discussed at [place] of Stay Meetings, dates discussed:    Comments:  12/30/13 [place] RN,BSN [phone] patient sister, [name] states she spoke with [facility] delivery and they state they will have DME there between 4pm and 8 pm. [name] will be picking patient up when DME has been delivered.  NCM spoke with [name], patient's sister, she states her daughter is on her way to pick patient up,  NCM asked [facility] rep if they could deliver a w/chair to patient's room so when he gets to sister's house he will be able to sit down and wait for the rest of dme to be delivered.  [facility] rep stated they will bring w/chair up to patient's room.  12/27/13 [place] [phone] patient will be living with  sister [name] phone  [phone], address of [address], [place] [address]. Patient has refused snf per CSW.  NCM informed patient will need Hospital bed, bsc and w/chair, patient chose [facility] for DME.  NCM informed Jerm[place] regarding hospital bed , bsc, and w/chair. NCM left message with Butch Penny with [facility] to see if patient is active with them for [facility], awaiting call back. NCM recieved call from [facility] with [facility] , stating patient is active with them for [facility] and social work.  Will need to resume,   NCM informed MD patient has refused snf.

## 2013-12-27 NOTE — Progress Notes (Signed)
Subjective: Pt feeling ok. S/p TIPS procedure yesterday with 3L paracentesis just prior. 53 He denies much of any pain, denies SOB. Has been tolerating po intake very well.  Objective: Physical Exam: BP 111/66  Pulse 88  Temp(Src) 98.4 F (36.9 C) (Oral)  Resp 18  Ht 5\' 8"  (1.727 m)  Wt 207 lb 0.2 oz (93.9 kg)  BMI 31.48 kg/m2  SpO2 95% Seems somewhat confused in general conversation but is A&O x 3. Neck: (R)IJV site clean, no hematoma Lungs: CTA Heart: Reg Abd: still some distension but much less compared to a few days ago. Still some soft tissue edema lateral abd wall.  Ammonia level 60  Labs: CBC  Recent Labs  12/26/13 0846 12/27/13 0257  WBC 7.7 6.1  HGB 10.4* 9.5*  HCT 29.4* 27.8*  PLT 96* 87*   BMET  Recent Labs  12/26/13 0846 12/27/13 0257  NA 129* 130*  K 4.3 4.3  CL 98 99  CO2 22 24  GLUCOSE 88 110*  BUN 10 9  CREATININE 0.71 0.69  CALCIUM 7.7* 8.1*   LFT  Recent Labs  12/27/13 0257  PROT 5.8*  ALBUMIN 2.0*  AST 42*  ALT 27  ALKPHOS 96  BILITOT 1.2   PT/INR  Recent Labs  12/26/13 0846 12/27/13 0257  LABPROT 20.9* 21.3*  INR 1.86* 1.91*     Studies/Results: Dg Chest 2 View  12/25/2013   CLINICAL DATA:  Leukocytosis.  EXAM: CHEST  2 VIEW  COMPARISON:  DG CHEST 2 VIEW dated 12/21/2013; DG CHEST 2 VIEW dated 12/10/2013  FINDINGS: Mediastinum hilar structures are normal. Mild basilar atelectasis noted. Previously identified infiltrate in the right lung base continues to clear with near complete clearing. Pleural effusions have almost completely cleared. No pneumothorax. Heart size and pulmonary vascularity normal. No acute osseous abnormality.  IMPRESSION: Mild basilar atelectasis. Near complete clearing of right base infiltrate and pleural effusions.   Electronically Signed   By: Marcello Moores  Register   On: 12/25/2013 15:57   US Paracentesis  12/25/2013   CLINICAL DATA:  Hepatitis-C, cirrhosis, recurrent ascites. Request is made for  diagnostic and therapeutic paracentesis.  EXAM: ULTRASOUND GUIDED DIAGNOSTIC AND THERAPEUTIC PARACENTESIS  COMPARISON:  PREVIOUS PARACENTESIS ON 12/22/2013.  PROCEDURE: An ultrasound guided paracentesis was thoroughly discussed with the patient and questions answered. The benefits, risks, alternatives and complications were also discussed. The patient understands and wishes to proceed with the procedure. Written consent was obtained.  Ultrasound was performed to localize and mark an adequate pocket of fluid in the left lower quadrant of the abdomen. The area was then prepped and draped in the normal sterile fashion. 1% Lidocaine was used for local anesthesia. Under ultrasound guidance a 19 gauge Yueh catheter was introduced. Paracentesis was performed. The catheter was removed and a dressing applied.  Complications: None.  FINDINGS: A total of approximately 4.7 liters of turbid, light amber fluid was removed. A fluid sample was sent for laboratory analysis.  IMPRESSION: Successful ultrasound guided diagnostic and therapeutic paracentesis yielding 4.7 liters of ascites.  Read by: Rowe Robert ,P.A.-C.   Electronically Signed   By: Markus Daft M.D.   On: 12/25/2013 16:08   Ir Tips  12/26/2013   CLINICAL DATA:  53 year old with cirrhosis, hepatitis C and recurrent ascites. Patient has respiratory distress associated with the ascites.  EXAM: TRANSJUGULAR INTRAHEPATIC PORTOSYSTEMIC SHUNT; IR PARACENTESIS  Physician: Stephan Minister. Anselm Pancoast, MD and Jacqulynn Cadet, MD  MEDICATIONS: General anesthesia  ANESTHESIA/SEDATION: The patient was monitored by anesthesia throughout  the procedure  CONTRAST:  100 mL Omnipaque 300  FLUOROSCOPY TIME:  18 min and 18 seconds  PROCEDURE: The procedure was explained to the patient. The risks and benefits of the procedure were discussed and the patient's questions were addressed. Informed consent was obtained from the patient. The patient received 2 units of FFP for this procedure. The patient was  intubated and placed under general anesthesia. The abdomen was evaluated with ultrasound. Patient was found to have perihepatic ascites. The abdomen was prepped and draped in sterile fashion. Maximal barrier sterile technique was utilized including caps, mask, sterile gowns, sterile gloves, sterile drape, hand hygiene and skin antiseptic. A Yeuh catheter was directed into the perihepatic fluid with ultrasound guidance. A paracentesis was performed. Approximately 3 L of blood-tinged fluid was removed during the procedure.  Attention was directed to accessing the portal venous system. The patient has known varices along the anterior abdomen that communicate with the left portal venous system. Prominent varices were identified in the anterior abdominal wall with ultrasound. Using sterile technique, a 21 gauge needle was directed into 1 of these varices with ultrasound guidance. A micropuncture dilator set was placed. Venography was performed. The contrast did not reflux into the portal venous system. A branch more caudal was targeted with ultrasound guidance. A 21 gauge needle was directed into this vein with ultrasound guidance and a micropuncture dilator was placed. Additional venography was performed. Unable to cannulate the left portal venous system with a 0.018 wire. As result, these catheters were left in place and attention was directed to achieve transhepatic portal venous access. Using ultrasound guidance, right portal vein was identified with ultrasound. A 22 gauge Chiba needle was directed into the right portal vein with ultrasound guidance. Contrast injection confirmed placement in the portal venous system. A wire was advanced into the main portal vein. A 3 French catheter was placed with a radiopaque wire within it. This catheter was secured to the skin.  The right side of the neck was prepped and draped in sterile fashion. Using ultrasound guidance, a 21 gauge needle was directed into the right internal  jugular vein with ultrasound guidance. A micropuncture dilator set was placed and upsized to a 10 [place] sheath. A MPV catheter was used to cannulate a prominent hepatic vein which probably represents the middle hepatic vein. Using the 3 French portal vein catheter as a target, the TIPS needle was directed towards the central aspect of the right portal vein with fluoroscopic guidance. Contrast injection confirmed placement within the portal venous system. A hydrophilic wire was advanced into the portal venous system. Catheter and Amplatz wire with advanced into the portal venous system. Pigtail catheter was placed for a portal venogram. The tract was dilated with an 5 mm x 40 mm balloon. A 10 mm x 80 mm (60 mm covered) Viatorr stent was selected. The stent was advanced into the main portal vein. The uncovered portion was positioned in the portal venous system. The remainder of the stent was deployed. The stent was post dilated with an 8 mm x 40 mm balloon. Portal vein and systemic pressures were obtained. The 10 French vascular sheath was removed with manual compression. The transhepatic portal vein catheter, paracentesis catheter and anterior abdominal catheters were removed with manual compression. The patient was transferred to the PACU for recovery.  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 We attempted to use  these veins as access to the portal venous system. The venography demonstrated that the flow was away from the liver and we were unable to reflux contrast into the portal venous system or cannulate the portal venous system with a wire. Right portal vein access was successfully achieved with a transhepatic approach using ultrasound and fluoroscopic guidance. The TIPS was created between a prominent hepatic vein (probably the middle hepatic vein) and the central aspect of the right portal  vein. The TIPS stent is widely patent. The post-procedure portal vein pressure was 21 mmHg. The post TIPS pressure in the hepatic veins and the right atrium was between 14-15 mmHg. Therefore, the pressure gradient after TIPS was between 6-7 mmHg.  IMPRESSION: Successful TIPS procedure.   Electronically Signed   By: Markus Daft M.D.   On: 12/26/2013 15:21   Dg Chest Port 1 View  12/26/2013   CLINICAL DATA:  Cough, smoker  EXAM: PORTABLE CHEST - 1 VIEW  COMPARISON:  12/25/2013  FINDINGS: Cardiomediastinal silhouette is stable. Mild basilar streaky atelectasis. Minimal central bronchitic changes. No focal infiltrate. No pulmonary edema.  IMPRESSION: Mild basilar streaky atelectasis. No segmental infiltrate. No pulmonary edema.   Electronically Signed   By: Lahoma Crocker M.D.   On: 12/26/2013 08:18   Ir Paracentesis  12/26/2013   CLINICAL DATA:  53 year old with cirrhosis, hepatitis C and recurrent ascites. Patient has respiratory distress associated with the ascites.  EXAM: TRANSJUGULAR INTRAHEPATIC PORTOSYSTEMIC SHUNT; IR PARACENTESIS  Physician: Stephan Minister. Anselm Pancoast, MD and Jacqulynn Cadet, MD  MEDICATIONS: General anesthesia  ANESTHESIA/SEDATION: The patient was monitored by anesthesia throughout the procedure  CONTRAST:  100 mL Omnipaque 300  FLUOROSCOPY TIME:  18 min and 18 seconds  PROCEDURE: The procedure was explained to the patient. The risks and benefits of the procedure were discussed and the patient's questions were addressed. Informed consent was obtained from the patient. The patient received 2 units of FFP for this procedure. The patient was intubated and placed under general anesthesia. The abdomen was evaluated with ultrasound. Patient was found to have perihepatic ascites. The abdomen was prepped and draped in sterile fashion. Maximal barrier sterile technique was utilized including caps, mask, sterile gowns, sterile gloves, sterile drape, hand hygiene and skin antiseptic. A Yeuh catheter was directed into  the perihepatic fluid with ultrasound guidance. A paracentesis was performed. Approximately 3 L of blood-tinged fluid was removed during the procedure.  Attention was directed to accessing the portal venous system. The patient has known varices along the anterior abdomen that communicate with the left portal venous system. Prominent varices were identified in the anterior abdominal wall with ultrasound. Using sterile technique, a 21 gauge needle was directed into 1 of these varices with ultrasound guidance. A micropuncture dilator set was placed. Venography was performed. The contrast did not reflux into the portal venous system. A branch more caudal was targeted with ultrasound guidance. A 21 gauge needle was directed into this vein with ultrasound guidance and a micropuncture dilator was placed. Additional venography was performed. Unable to cannulate the left portal venous system with a 0.018 wire. As result, these catheters were left in place and attention was directed to achieve transhepatic portal venous access. Using ultrasound guidance, right portal vein was identified with ultrasound. A 22 gauge Chiba needle was directed into the right portal vein with ultrasound guidance. Contrast injection confirmed placement in the portal venous system. A wire was advanced into the main portal vein. A 3 French catheter was placed with a radiopaque  wire within it. This catheter was secured to the skin.  The right side of the neck was prepped and draped in sterile fashion. Using ultrasound guidance, a 21 gauge needle was directed into the right internal jugular vein with ultrasound guidance. A micropuncture dilator set was placed and upsized to a 10 [place] sheath. A MPV catheter was used to cannulate a prominent hepatic vein which probably represents the middle hepatic vein. Using the 3 French portal vein catheter as a target, the TIPS needle was directed towards the central aspect of the right portal vein with  fluoroscopic guidance. Contrast injection confirmed placement within the portal venous system. A hydrophilic wire was advanced into the portal venous system. Catheter and Amplatz wire with advanced into the portal venous system. Pigtail catheter was placed for a portal venogram. The tract was dilated with an 5 mm x 40 mm balloon. A 10 mm x 80 mm (60 mm covered) Viatorr stent was selected. The stent was advanced into the main portal vein. The uncovered portion was positioned in the portal venous system. The remainder of the stent was deployed. The stent was post dilated with an 8 mm x 40 mm balloon. Portal vein and systemic pressures were obtained. The 10 French vascular sheath was removed with manual compression. The transhepatic portal vein catheter, paracentesis catheter and anterior abdominal catheters were removed with manual compression. The patient was transferred to the PACU for recovery.  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 We attempted to use these veins as access to the portal venous system. The venography demonstrated that the flow was away from the liver and we were unable to reflux contrast into the portal venous system or cannulate the portal venous system with a wire. Right portal vein access was successfully achieved with a transhepatic approach using ultrasound and fluoroscopic guidance. The TIPS was created between a prominent hepatic vein (probably the middle hepatic vein) and the central aspect of the right portal vein. The TIPS stent is widely patent. The post-procedure portal vein pressure was 21 mmHg. The post TIPS pressure in the hepatic veins and the right atrium was between 14-15 mmHg. Therefore, the pressure gradient after TIPS was between 6-7 mmHg.  IMPRESSION: Successful TIPS procedure.   Electronically Signed   By: Markus Daft M.D.   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 S/p TIPS 2/26 Doing as well as expected POD #1 Continue Lactulose and Rifaximin Chronic recurrent ascites, s/p Para on 2/25 (4.7L) and 2/26 (3L) Repeat US today finds some residual ascites, but not enough for therapeutic paracentesis. He may need this in another few days. Reporting to Dr. Laurence Ferrari    LOS: 17 days    Ascencion Dike PA-C 12/27/2013 2:11 PM

## 2013-12-27 NOTE — Progress Notes (Signed)
Daily Rounding Note  12/27/2013, 8:37 AM  LOS: 17 days   SUBJECTIVE:       Some pain in neck.  No abdominal or chest pain.  No nausea, eating very well.  Urinating without difficulty.  No dyspnea.  Still difficult to bend forward or sit up in bed due to large, tight abdomen. On contact precautions for MRSA + nasal swab.  No stools yet today.   OBJECTIVE:         Vital signs in last 24 hours:    Temp:  [97.8 F (36.6 C)-98.4 F (36.9 C)] 98.4 F (36.9 C) (02/27 0552) Pulse Rate:  [88-94] 88 (02/27 0552) Resp:  [17-23] 18 (02/27 0552) BP: (110-118)/(61-69) 111/66 mmHg (02/27 0552) SpO2:  [95 %-100 %] 95 % (02/27 0552) Weight:  [93.9 kg (207 lb 0.2 oz)] 93.9 kg (207 lb 0.2 oz) (02/27 0500) Last BM Date: 12/26/13  General: looks unwell, not acutely ill.  Comfortable.   Heart: RRR.  No MRG Chest: soft ronchi/crackles in bases.  No cough or SOB Abdomen: more tense and protuberant than yesterday.  Ascites edema tracking into rear flanks. No tenderness.  Both gauze bandages are clean, dry and unstained.   Extremities: 1 + edema in lower legs and feet, markedly improved c/w admission  Neuro/Psych:  Oriented to place, self, time, medical issues.  Appropriate, cooperative, relaxed.  Mild asterixis   Intake/Output from previous day: 02/26 0701 - 02/27 0700 In: 2585 [P.O.:360; I.V.:1200; Blood:525; IV Piggyback:500] Out: 1750 [Urine:1750]  Intake/Output this shift:    Lab Results:  Recent Labs  Dec 30, 2013 0550 12/26/13 0846 12/27/13 0257  WBC 12.6* 7.7 6.1  HGB 10.4* 10.4* 9.5*  HCT 30.7* 29.4* 27.8*  PLT 100* 96* 87*   BMET  Recent Labs  2013-12-30 0550 12/26/13 0846 12/27/13 0257  NA 126* 129* 130*  K 4.0 4.3 4.3  CL 93* 98 99  CO2 21 22 24   GLUCOSE 111* 88 110*  BUN 14 10 9   CREATININE 0.77 0.71 0.69  CALCIUM 8.4 7.7* 8.1*   LFT  Recent Labs  12/26/13 0846 12/27/13 0257  PROT 5.9* 5.8*  ALBUMIN  1.8* 2.0*  AST 48* 42*  ALT 31 27  ALKPHOS 104 96  BILITOT 1.2 1.2   PT/INR  Recent Labs  12/26/13 0846 12/27/13 0257  LABPROT 20.9* 21.3*  INR 1.86* 1.91*   Micro Ascitic culture 1/25: +WBCs (PMNs), negative organisms  Asitic analysis:   Ref. Range 12/30/13 14:45  Monocyte-Macrophage-Serous Fluid Latest Range: 50-90 % 46 (L)  Other Cells, Fluid No range found MODERATE  Fluid Type-FCT No range found ASCITIC  Color, Fluid Latest Range: YELLOW  STRAW (A)  WBC, Fluid Latest Range: 0-1000 cu mm 214  Lymphs, Fluid No range found 24  Appearance, Fluid Latest Range: CLEAR  HAZY (A)  Neutrophil Count, Fluid Latest Range: 0-25 % 24     Studies/Results: US Paracentesis 12/30/2013  FINDINGS: A total of approximately 4.7 liters of turbid, light amber fluid was removed. A fluid sample was sent for laboratory analysis.  IMPRESSION: Successful ultrasound guided diagnostic and therapeutic paracentesis yielding 4.7 liters of ascites.  Read by: Rowe Robert ,P.A.-C.   Electronically Signed   By: Markus Daft M.D.   On: December 30, 2013 16:08   Dg Chest Port 1 View 12/26/2013   CLINICAL DATA:  Cough, smoker  EXAM: PORTABLE CHEST - 1 VIEW  COMPARISON:  2013-12-30  FINDINGS: Cardiomediastinal silhouette is stable. Mild  basilar streaky atelectasis. Minimal central bronchitic changes. No focal infiltrate. No pulmonary edema.  IMPRESSION: Mild basilar streaky atelectasis. No segmental infiltrate. No pulmonary edema.   Electronically Signed   By: Lahoma Crocker M.D.   On: 12/26/2013 08:18   Ir Paracentesis 12/26/2013    PROCEDURE: The procedure was explained to the patient. The risks and benefits of the procedure were discussed and the patient's questions were addressed. Informed consent was obtained from the patient. The patient received 2 units of FFP for this procedure. The patient was intubated and placed under general anesthesia. The abdomen was evaluated with ultrasound. Patient was found to have perihepatic  ascites. The abdomen was prepped and draped in sterile fashion. Maximal barrier sterile technique was utilized including caps, mask, sterile gowns, sterile gloves, sterile drape, hand hygiene and skin antiseptic. A Yeuh catheter was directed into the perihepatic fluid with ultrasound guidance. A paracentesis was performed. Approximately 3 L of blood-tinged fluid was removed during the procedure.  Attention was directed to accessing the portal venous system. The patient has known varices along the anterior abdomen that communicate with the left portal venous system. Prominent varices were identified in the anterior abdominal wall with ultrasound. Using sterile technique, a 21 gauge needle was directed into 1 of these varices with ultrasound guidance. A micropuncture dilator set was placed. Venography was performed. The contrast did not reflux into the portal venous system. A branch more caudal was targeted with ultrasound guidance. A 21 gauge needle was directed into this vein with ultrasound guidance and a micropuncture dilator was placed. Additional venography was performed. Unable to cannulate the left portal venous system with a 0.018 wire. As result, these catheters were left in place and attention was directed to achieve transhepatic portal venous access. Using ultrasound guidance, right portal vein was identified with ultrasound. A 22 gauge Chiba needle was directed into the right portal vein with ultrasound guidance. Contrast injection confirmed placement in the portal venous system. A wire was advanced into the main portal vein. A 3 French catheter was placed with a radiopaque wire within it. This catheter was secured to the skin.  The right side of the neck was prepped and draped in sterile fashion. Using ultrasound guidance, a 21 gauge needle was directed into the right internal jugular vein with ultrasound guidance. A micropuncture dilator set was placed and upsized to a 10 [place] sheath. A MPV catheter  was used to cannulate a prominent hepatic vein which probably represents the middle hepatic vein. Using the 3 French portal vein catheter as a target, the TIPS needle was directed towards the central aspect of the right portal vein with fluoroscopic guidance. Contrast injection confirmed placement within the portal venous system. A hydrophilic wire was advanced into the portal venous system. Catheter and Amplatz wire with advanced into the portal venous system. Pigtail catheter was placed for a portal venogram. The tract was dilated with an 5 mm x 40 mm balloon. A 10 mm x 80 mm (60 mm covered) Viatorr stent was selected. The stent was advanced into the main portal vein. The uncovered portion was positioned in the portal venous system. The remainder of the stent was deployed. The stent was post dilated with an 8 mm x 40 mm balloon. Portal vein and systemic pressures were obtained. The 10 French vascular sheath was removed with manual compression. The transhepatic portal vein catheter, paracentesis catheter and anterior abdominal catheters were removed with manual compression. The patient was transferred to the PACU for recovery.  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 We attempted to use these veins as access to the portal venous system. The venography demonstrated that the flow was away from the liver and we were unable to reflux contrast into the portal venous system or cannulate the portal venous system with a wire. Right portal vein access was successfully achieved with a transhepatic approach using ultrasound and fluoroscopic guidance. The TIPS was created between a prominent hepatic vein (probably the middle hepatic vein) and the central aspect of the right portal vein. The TIPS stent is widely patent. The post-procedure portal vein pressure was 21 mmHg. The post TIPS pressure in the  hepatic veins and the right atrium was between 14-15 mmHg. Therefore, the pressure gradient after TIPS was between 6-7 mmHg.  IMPRESSION: Successful TIPS procedure.   Electronically Signed   By: Markus Daft M.D.   On: 12/26/2013 15:21    ASSESMENT:   * Hep C, cirrhosis  * Ascites, s/p multiple large volume paracentesis. Diuretic management hampered by hyponatremia. S/p TIPS and another 3 litre paracentesis 2/26. Remains on Lasix 40 mg/day. Overall weight is down 50 # in 16 days. Abdomen more tense and protuberant than yesterday post procedure. This fluid is again negative for SBP.  * Coagulopathy. Received 2 units FFP today to allow for TIPS. *  Hyponatremia, improved.   *  Pulmonary infiltrates, resolved.  * Thrombocytopenia. Stable.  *  Protein malnutrition.  * UGIB January 2015 (hemetemesis, blood per rectum) . S/p EGD 11/08/13: 2 trunks medium sized varices without stigmata treated with band ligation x 5. Old clot in stomach. 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  *  Normocytic anemia. Has not required RBCs this admission.  * Schizophrenia dating to pt's early 19s. AMS during January admission. Ammonia into 70s, now 28, on lactulose and Rifaxamin.     PLAN   *  Follow weights, chemistries.  Continue lactulose and Rifaxamin.  *  IR has ordered paracentesis for today  *  IR will be following up in one month.   *  PT/OT to work on strength/conditioning.  May need SNF rehab although CSW entry of 2/24 notes pt not interested in SNF.    Oscar Swanson  12/27/2013, 8:37 AM Pager: [phone]     Attending physician's note   I have taken an interval history, reviewed the chart and examined the patient. I agree with the Advanced Practitioner's note, impression and recommendations. Ascites remains symptomatic. Paracentesis planned today by IR. Follow daily weights, volume status, and chemistries closely. Hyponatremia has improved. Diuretics may need adjustment. Mental status is stable. Continue  lactulose and rifaxamin.   Pricilla Riffle. Fuller Plan, MD [facility]

## 2013-12-27 NOTE — Progress Notes (Addendum)
Patient ID: Oscar Swanson, male   DOB: 1961/01/11, 53 y.o.   MRN: [id number]  [facility] PROGRESS NOTE  CAYNE YOM KGU:[id number] DOB: 23-Jun-1961 DOA: 12/10/2013 PCP: Angelica Chessman, MD  Brief narrative:   53 y.o. male with a past medical history of alcohol/HCV cirrhosis, hepatic encephalopathy, intractible ascitis, esophageal varices, chronic thrombocytopenia who presented with complaints of abdominal distention and shortness of breath. He was seen at GI clinic initially and was recommended for admission for tense ascites. Had paracentesis on 12/21/13 with removal of 6L of ascitic fluid. Given respiratory compromise from rapidly accumulating ascitis, GI recommending TIPS. Doppler US showing patent vessels.  Interventional radiology and GI recommending transfer to [facility] for TIPs.     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 Continue Lasix, spironolactone, Xifaxan and lactulose - abdomen still distended despite TIPS on 12/26/2013, have requested IR for another round of therapeutic paracentesis on 12/27/2013. - total of 24 liters of ascites removed so far (once on 2/11, second on 2/13, third 2/19, fourth on 2/21)  - GI following the patient     Esophageal varices with bleeding  - currently stable, continue Protonix for now  - appreciate GI input, needed EGD with band ligation x5 in January 2015  -TIPS done on 2-26     Acute hypoxic respiratory failure  - secondary to PNA - completed therapy with Levaquin for 7 days  - shortness of breath at this point secondary to severe ascites and resulting abdominal distension      Hyponatremia  - due to cirrhosis and improved with diuresis and paracentesis  - chronic and secondary to volume status, third spacing, use of Lasix  - overall stable and better over the past few  days     Severe malnutrition  - secondary to progressive liver cirrhosis and FTT - continue 2 gram sodium diet  - nutrition consultations appreciated      Hepatitis C  - CMET overall stable -Positive viral load  GI following     Anemia of chronic disease  - no signs of active bleeding      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 Portal venous system is patent. TIPS created between right hepatic vein and central aspect of right portal vein. Pressure gradient after TIPS creation was between 6-7 mmHg. 2-27--> requested for another ultrasound-guided paracentesis by IR    Antibiotics:  Levaquin 2/12 --> 2/18    Code Status: Full  Family Communication: Pt at bedside  Disposition Plan: Home when medically stable, not ready yet for discharge     HPI/Subjective:  Patient in bed, mildly confused, no chest abdominal pain, no shortness of breath, no focal weakness.    Objective: Filed Vitals:   12/26/13 1552 12/26/13 2046 12/27/13 0500 12/27/13 0552  BP: 110/61 112/69  111/66  Pulse:  89  88  Temp: 98.1 F (36.7 C) 97.9 F (36.6 C)  98.4 F (36.9 C)  TempSrc: Oral Oral  Oral  Resp:  19  18  Height:      Weight:   93.9 kg (207 lb 0.2 oz)   SpO2: 98% 100%  95%    Intake/Output Summary (Last 24 hours) at 12/27/13 1057 Last data filed at 12/27/13 0500  Gross per 24 hour  Intake   2370 ml  Output   1400 ml  Net    970 ml    Exam:   General:  Pt is mildly confused, follows  commands appropriately, not in acute distress  Cardiovascular: Regular rate and rhythm, S1/S2, no murmurs, no rubs, no gallops  Respiratory: Clear to auscultation bilaterally, no wheezing, no crackles, no rhonchi  Abdomen: Soft but distended more today, bowel sounds present, no guarding, Foley in place  Extremities: upper and lower extremity edema, pulses DP and PT palpable bilaterally  Neuro: Grossly nonfocal    Data Reviewed:  Basic Metabolic Panel:   Recent Labs Lab 12/23/13 0450 12/24/13 0520 12/25/13 0550 12/26/13 0846 12/27/13 0257  NA 128* 125* 126* 129* 130*  K 4.1 4.2 4.0 4.3 4.3  CL 95* 94* 93* 98 99  CO2 24 26 21 22 24   GLUCOSE 83 100* 111* 88 110*  BUN 13 13 14 10 9   CREATININE 0.86 0.89 0.77 0.71 0.69  CALCIUM 8.1* 8.1* 8.4 7.7* 8.1*   Liver Function Tests:  Recent Labs Lab 12/23/13 0450 12/26/13 0846 12/27/13 0257  AST 66* 48* 42*  ALT 36 31 27  ALKPHOS 107 104 96  BILITOT 1.1 1.2 1.2  PROT 5.8* 5.9* 5.8*  ALBUMIN 1.8* 1.8* 2.0*    Recent Labs Lab 12/27/13 0257  AMMONIA 60   CBC:  Recent Labs Lab 12/23/13 0450 12/24/13 0520 12/25/13 0550 12/26/13 0846 12/27/13 0257  WBC 6.9 7.9 12.6* 7.7 6.1  HGB 10.0* 9.8* 10.4* 10.4* 9.5*  HCT 29.8* 29.6* 30.7* 29.4* 27.8*  MCV 89.2 89.2 89.2 86.7 89.1  PLT 84* 89* 100* 96* 87*   Scheduled Meds: . Chlorhexidine Gluconate Cloth  6 each Topical Q0600  . feeding supplement (ENSURE COMPLETE)  237 mL Oral TID WC  . folic acid  1 mg Oral Daily  . furosemide  40 mg Oral Daily  . lactulose  30 g Oral TID  . lidocaine  1 patch Transdermal Q24H  . LORazepam  0.5 mg Oral QHS,MR X 1  . multivitamin with minerals  1 tablet Oral Daily  . mupirocin ointment  1 application Nasal BID  . nicotine  14 mg Transdermal Daily  . pantoprazole  40 mg Oral BID  . rifaximin  550 mg Oral BID  . sodium chloride  3 mL Intravenous Q12H  . thiamine  100 mg Oral Daily   Continuous Infusions:   Thurnell Lose,  MD   Pager [phone]  If 7PM-7AM, please contact night-coverage [email or web address] Password [facility] 12/27/2013, 10:57 AM   LOS: 17 days

## 2013-12-28 ENCOUNTER — Other Ambulatory Visit: Payer: Self-pay | Admitting: Radiology

## 2013-12-28 DIAGNOSIS — K7290 Hepatic failure, unspecified without coma: Secondary | ICD-10-CM

## 2013-12-28 DIAGNOSIS — B1920 Unspecified viral hepatitis C without hepatic coma: Secondary | ICD-10-CM

## 2013-12-28 DIAGNOSIS — K7030 Alcoholic cirrhosis of liver without ascites: Secondary | ICD-10-CM

## 2013-12-28 DIAGNOSIS — R188 Other ascites: Secondary | ICD-10-CM

## 2013-12-28 LAB — COMPREHENSIVE METABOLIC PANEL
ALBUMIN: 1.9 g/dL — AB (ref 3.5–5.2)
ALK PHOS: 103 U/L (ref 39–117)
ALT: 26 U/L (ref 0–53)
AST: 44 U/L — ABNORMAL HIGH (ref 0–37)
BUN: 7 mg/dL (ref 6–23)
CALCIUM: 8 mg/dL — AB (ref 8.4–10.5)
CO2: 25 mEq/L (ref 19–32)
Chloride: 99 mEq/L (ref 96–112)
Creatinine, Ser: 0.69 mg/dL (ref 0.50–1.35)
GFR calc non Af Amer: >90 mL/min (ref >90)
GLUCOSE: 98 mg/dL (ref 70–99)
POTASSIUM: 4.3 meq/L (ref 3.7–5.3)
Sodium: 132 mEq/L — ABNORMAL LOW (ref 137–147)
TOTAL PROTEIN: 5.9 g/dL — AB (ref 6.0–8.3)
Total Bilirubin: 1.1 mg/dL (ref 0.3–1.2)

## 2013-12-28 LAB — BODY FLUID CULTURE
Culture: NO GROWTH
Special Requests: NORMAL

## 2013-12-28 LAB — AMMONIA: AMMONIA: 32 umol/L (ref 11–60)

## 2013-12-28 LAB — PROTIME-INR
INR: 1.6 — ABNORMAL HIGH (ref 0.00–1.49)
PROTHROMBIN TIME: 18.6 s — AB (ref 11.6–15.2)

## 2013-12-28 NOTE — Progress Notes (Signed)
Subjective: No acute events after the TIPS placement.  No issues with encephalopathy.  Objective: Vital signs in last 24 hours: Temp:  [98.7 F (37.1 C)-99.3 F (37.4 C)] 99.3 F (37.4 C) (02/28 0421) Pulse Rate:  [84-93] 90 (02/28 0421) Resp:  [18] 18 (02/28 0421) BP: (111-118)/(64-72) 112/72 mmHg (02/28 0421) SpO2:  [97 %-100 %] 100 % (02/28 0421) Weight:  [208 lb 5.4 oz (94.5 kg)] 208 lb 5.4 oz (94.5 kg) (02/28 0421) Last BM Date: 12/26/13  Intake/Output from previous day: 02/27 0701 - 02/28 0700 In: -  Out: 1500 [Urine:1500] Intake/Output this shift:    General appearance: alert and no distress GI: distended with ascites, soft  Lab Results:  Recent Labs  12/26/13 0846 12/27/13 0257  WBC 7.7 6.1  HGB 10.4* 9.5*  HCT 29.4* 27.8*  PLT 96* 87*   BMET  Recent Labs  12/26/13 0846 12/27/13 0257 12/28/13 0551  NA 129* 130* 132*  K 4.3 4.3 4.3  CL 98 99 99  CO2 22 24 25   GLUCOSE 88 110* 98  BUN 10 9 7   CREATININE 0.71 0.69 0.69  CALCIUM 7.7* 8.1* 8.0*   LFT  Recent Labs  12/28/13 0551  PROT 5.9*  ALBUMIN 1.9*  AST 44*  ALT 26  ALKPHOS 103  BILITOT 1.1   PT/INR  Recent Labs  12/27/13 0257 12/28/13 0551  LABPROT 21.3* 18.6*  INR 1.91* 1.60*   Hepatitis Panel No results found for this basename: HEPBSAG, HCVAB, HEPAIGM, HEPBIGM,  in the last 72 hours C-Diff No results found for this basename: CDIFFTOX,  in the last 72 hours Fecal Lactopherrin No results found for this basename: FECLLACTOFRN,  in the last 72 hours  Studies/Results: US Abdomen Limited  12/27/2013   CLINICAL DATA:  Evaluate abdominal ascites for a paracentesis.  EXAM: LIMITED ABDOMEN ULTRASOUND FOR ASCITES  TECHNIQUE: Limited ultrasound survey for ascites was performed in all four abdominal quadrants.  COMPARISON:  12/25/2013  FINDINGS: There is a small amount of ascites along the dome of the liver. Small amount of fluid in the paracolic gutters bilaterally and a small amount of  fluid in the right lower quadrant.  IMPRESSION: Small amount of ascites in the abdomen. Paracentesis was not performed due to the small quantity of fluid.   Electronically Signed   By: Markus Daft M.D.   On: 12/27/2013 14:24   Ir Tips  12/26/2013   CLINICAL DATA:  53 year old with cirrhosis, hepatitis C and recurrent ascites. Patient has respiratory distress associated with the ascites.  EXAM: TRANSJUGULAR INTRAHEPATIC PORTOSYSTEMIC SHUNT; IR PARACENTESIS  Physician: Stephan Minister. Anselm Pancoast, MD and Jacqulynn Cadet, MD  MEDICATIONS: General anesthesia  ANESTHESIA/SEDATION: The patient was monitored by anesthesia throughout the procedure  CONTRAST:  100 mL Omnipaque 300  FLUOROSCOPY TIME:  18 min and 18 seconds  PROCEDURE: The procedure was explained to the patient. The risks and benefits of the procedure were discussed and the patient's questions were addressed. Informed consent was obtained from the patient. The patient received 2 units of FFP for this procedure. The patient was intubated and placed under general anesthesia. The abdomen was evaluated with ultrasound. Patient was found to have perihepatic ascites. The abdomen was prepped and draped in sterile fashion. Maximal barrier sterile technique was utilized including caps, mask, sterile gowns, sterile gloves, sterile drape, hand hygiene and skin antiseptic. A Yeuh catheter was directed into the perihepatic fluid with ultrasound guidance. A paracentesis was performed. Approximately 3 L of blood-tinged fluid was removed during  the procedure.  Attention was directed to accessing the portal venous system. The patient has known varices along the anterior abdomen that communicate with the left portal venous system. Prominent varices were identified in the anterior abdominal wall with ultrasound. Using sterile technique, a 21 gauge needle was directed into 1 of these varices with ultrasound guidance. A micropuncture dilator set was placed. Venography was performed. The  contrast did not reflux into the portal venous system. A branch more caudal was targeted with ultrasound guidance. A 21 gauge needle was directed into this vein with ultrasound guidance and a micropuncture dilator was placed. Additional venography was performed. Unable to cannulate the left portal venous system with a 0.018 wire. As result, these catheters were left in place and attention was directed to achieve transhepatic portal venous access. Using ultrasound guidance, right portal vein was identified with ultrasound. A 22 gauge Chiba needle was directed into the right portal vein with ultrasound guidance. Contrast injection confirmed placement in the portal venous system. A wire was advanced into the main portal vein. A 3 French catheter was placed with a radiopaque wire within it. This catheter was secured to the skin.  The right side of the neck was prepped and draped in sterile fashion. Using ultrasound guidance, a 21 gauge needle was directed into the right internal jugular vein with ultrasound guidance. A micropuncture dilator set was placed and upsized to a 10 [place] sheath. A MPV catheter was used to cannulate a prominent hepatic vein which probably represents the middle hepatic vein. Using the 3 French portal vein catheter as a target, the TIPS needle was directed towards the central aspect of the right portal vein with fluoroscopic guidance. Contrast injection confirmed placement within the portal venous system. A hydrophilic wire was advanced into the portal venous system. Catheter and Amplatz wire with advanced into the portal venous system. Pigtail catheter was placed for a portal venogram. The tract was dilated with an 5 mm x 40 mm balloon. A 10 mm x 80 mm (60 mm covered) Viatorr stent was selected. The stent was advanced into the main portal vein. The uncovered portion was positioned in the portal venous system. The remainder of the stent was deployed. The stent was post dilated with an 8 mm x 40  mm balloon. Portal vein and systemic pressures were obtained. The 10 French vascular sheath was removed with manual compression. The transhepatic portal vein catheter, paracentesis catheter and anterior abdominal catheters were removed with manual compression. The patient was transferred to the PACU for recovery.  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 We attempted to use these veins as access to the portal venous system. The venography demonstrated that the flow was away from the liver and we were unable to reflux contrast into the portal venous system or cannulate the portal venous system with a wire. Right portal vein access was successfully achieved with a transhepatic approach using ultrasound and fluoroscopic guidance. The TIPS was created between a prominent hepatic vein (probably the middle hepatic vein) and the central aspect of the right portal vein. The TIPS stent is widely patent. The post-procedure portal vein pressure was 21 mmHg. The post TIPS pressure in the hepatic veins and the right atrium was between 14-15 mmHg. Therefore, the pressure gradient after TIPS was between 6-7 mmHg.  IMPRESSION: Successful TIPS procedure.   Electronically Signed   By: Markus Daft  M.D.   On: 12/26/2013 15:21   Dg Chest Port 1 View  12/26/2013   CLINICAL DATA:  Cough, smoker  EXAM: PORTABLE CHEST - 1 VIEW  COMPARISON:  12/25/2013  FINDINGS: Cardiomediastinal silhouette is stable. Mild basilar streaky atelectasis. Minimal central bronchitic changes. No focal infiltrate. No pulmonary edema.  IMPRESSION: Mild basilar streaky atelectasis. No segmental infiltrate. No pulmonary edema.   Electronically Signed   By: Lahoma Crocker M.D.   On: 12/26/2013 08:18   Ir Paracentesis  12/26/2013   CLINICAL DATA:  53 year old with cirrhosis, hepatitis C and recurrent ascites. Patient has respiratory distress  associated with the ascites.  EXAM: TRANSJUGULAR INTRAHEPATIC PORTOSYSTEMIC SHUNT; IR PARACENTESIS  Physician: Stephan Minister. Anselm Pancoast, MD and Jacqulynn Cadet, MD  MEDICATIONS: General anesthesia  ANESTHESIA/SEDATION: The patient was monitored by anesthesia throughout the procedure  CONTRAST:  100 mL Omnipaque 300  FLUOROSCOPY TIME:  18 min and 18 seconds  PROCEDURE: The procedure was explained to the patient. The risks and benefits of the procedure were discussed and the patient's questions were addressed. Informed consent was obtained from the patient. The patient received 2 units of FFP for this procedure. The patient was intubated and placed under general anesthesia. The abdomen was evaluated with ultrasound. Patient was found to have perihepatic ascites. The abdomen was prepped and draped in sterile fashion. Maximal barrier sterile technique was utilized including caps, mask, sterile gowns, sterile gloves, sterile drape, hand hygiene and skin antiseptic. A Yeuh catheter was directed into the perihepatic fluid with ultrasound guidance. A paracentesis was performed. Approximately 3 L of blood-tinged fluid was removed during the procedure.  Attention was directed to accessing the portal venous system. The patient has known varices along the anterior abdomen that communicate with the left portal venous system. Prominent varices were identified in the anterior abdominal wall with ultrasound. Using sterile technique, a 21 gauge needle was directed into 1 of these varices with ultrasound guidance. A micropuncture dilator set was placed. Venography was performed. The contrast did not reflux into the portal venous system. A branch more caudal was targeted with ultrasound guidance. A 21 gauge needle was directed into this vein with ultrasound guidance and a micropuncture dilator was placed. Additional venography was performed. Unable to cannulate the left portal venous system with a 0.018 wire. As result, these catheters were left  in place and attention was directed to achieve transhepatic portal venous access. Using ultrasound guidance, right portal vein was identified with ultrasound. A 22 gauge Chiba needle was directed into the right portal vein with ultrasound guidance. Contrast injection confirmed placement in the portal venous system. A wire was advanced into the main portal vein. A 3 French catheter was placed with a radiopaque wire within it. This catheter was secured to the skin.  The right side of the neck was prepped and draped in sterile fashion. Using ultrasound guidance, a 21 gauge needle was directed into the right internal jugular vein with ultrasound guidance. A micropuncture dilator set was placed and upsized to a 10 [place] sheath. A MPV catheter was used to cannulate a prominent hepatic vein which probably represents the middle hepatic vein. Using the 3 French portal vein catheter as a target, the TIPS needle was directed towards the central aspect of the right portal vein with fluoroscopic guidance. Contrast injection confirmed placement within the portal venous system. A hydrophilic wire was advanced into the portal venous system. Catheter and Amplatz wire with advanced into the portal venous system. Pigtail catheter was placed for  a portal venogram. The tract was dilated with an 5 mm x 40 mm balloon. A 10 mm x 80 mm (60 mm covered) Viatorr stent was selected. The stent was advanced into the main portal vein. The uncovered portion was positioned in the portal venous system. The remainder of the stent was deployed. The stent was post dilated with an 8 mm x 40 mm balloon. Portal vein and systemic pressures were obtained. The 10 French vascular sheath was removed with manual compression. The transhepatic portal vein catheter, paracentesis catheter and anterior abdominal catheters were removed with manual compression. The patient was transferred to the PACU for recovery.  COMPLICATIONS: None  FINDINGS: Patient has multiple  varices along the anterior abdominal wall that communicate with the left portal venous system based on the CT. We attempted to use these veins as access to the portal venous system. The venography demonstrated that the flow was away from the liver and we were unable to reflux contrast into the portal venous system or cannulate the portal venous system with a wire. Right portal vein access was successfully achieved with a transhepatic approach using ultrasound and fluoroscopic guidance. The TIPS was created between a prominent hepatic vein (probably the middle hepatic vein) and the central aspect of the right portal vein. The TIPS stent is widely patent. The post-procedure portal vein pressure was 21 mmHg. The post TIPS pressure in the hepatic veins and the right atrium was between 14-15 mmHg. Therefore, the pressure gradient after TIPS was between 6-7 mmHg.  IMPRESSION: Successful TIPS procedure.   Electronically Signed   By: Markus Daft M.D.   On: 12/26/2013 15:21    Medications:  Scheduled: . Chlorhexidine Gluconate Cloth  6 each Topical Q0600  . feeding supplement (ENSURE COMPLETE)  237 mL Oral TID WC  . folic acid  1 mg Oral Daily  . furosemide  40 mg Oral Daily  . lactulose  30 g Oral TID  . lidocaine  1 patch Transdermal Q24H  . LORazepam  0.5 mg Oral QHS,MR X 1  . multivitamin with minerals  1 tablet Oral Daily  . mupirocin ointment  1 application Nasal BID  . nicotine  14 mg Transdermal Daily  . pantoprazole  40 mg Oral BID  . rifaximin  550 mg Oral BID  . sodium chloride  3 mL Intravenous Q12H  . thiamine  100 mg Oral Daily   Continuous:   Assessment/Plan: 1) Refractory ascites. 2) HCV/ETOH cirrhosis.   No acute events.  No evidence of hepatic encephalopathy post procedure.  Plan: 1) Continue to monitor weight and any changes with mental status.   LOS: 18 days   Narciso Stoutenburg D 12/28/2013, 7:15 AM

## 2013-12-28 NOTE — Progress Notes (Signed)
Patient ID: Oscar Swanson, male   DOB: [birth year], 53 y.o.   MRN: [id number]  [facility] PROGRESS NOTE  Oscar Swanson DGU:[id number] DOB: 1961/02/22 DOA: 12/10/2013 PCP: Angelica Chessman, MD   Brief narrative:   53 y.o. male with a past medical history of alcohol/HCV cirrhosis, hepatic encephalopathy, intractible ascitis, esophageal varices, chronic thrombocytopenia who presented with complaints of abdominal distention and shortness of breath. He was seen at GI clinic initially and was recommended for admission for tense ascites. Had paracentesis on 12/21/13 with removal of 6L of ascitic fluid. Given respiratory compromise from rapidly accumulating ascitis, GI recommending TIPS. Doppler US showing patent vessels.  Interventional radiology and GI recommending transfer to [facility] for TIPs. Underwent TIPS on 12-26-13.       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 Continue Lasix, spironolactone, Xifaxan and lactulose - abdomen still distended despite TIPS on 12/26/2013, Total of 24 liters of ascites removed so far (once on 2/11, second on 2/13, third 2/19, fourth on 2/21)  - GI following the patient     Esophageal varices with bleeding  - currently stable, continue Protonix for now  - appreciate GI input, needed EGD with band ligation x 5 in January 2015, TIPS done on 2-26     Acute hypoxic respiratory failure  - secondary to PNA - completed therapy with Levaquin for 7 days,  shortness of breath at this point secondary to severe ascites and resulting abdominal distension      Hyponatremia  - due to cirrhosis and improved with diuresis and paracentesis  - chronic and secondary to volume status, third spacing, use of Lasix  - overall stable and better over the past few days     Severe malnutrition  - secondary to  progressive liver cirrhosis and FTT - continue 2 gram sodium diet, nutrition consultations appreciated      Hepatitis C  - CMET overall stable, Positive viral load, GI following     Anemia of chronic disease  - no signs of active bleeding      Thrombocytopenia  - secondary to alcohol induced bone marrow damage, no signs of active bleeding      Consultants:  IR for US guided paracentesis  GI     Procedures/Studies:  02/11 --> US guided paracentesis, 6 L ascites removed  02/13 --> US guided paracentesis, 6 L ascites removed  02/19 --> US guided paracentesis, 6 L ascites removed  02/21 --> US guided paracentesis, 6 L ascites removed 2/26 - TIPs by IR  Varices along the abdominal wall were cannulated but unable to reflux into portal venous system. Portal venous system is patent. TIPS created between right hepatic vein and central aspect of right portal vein. Pressure gradient after TIPS creation was between 6-7 mmHg. 2-27--> requested for another ultrasound-guided paracentesis by IR    Antibiotics:  Levaquin 2/12 --> 2/18    Code Status: Full  Family Communication: Pt at bedside  Disposition Plan: Home when medically stable, not ready yet for discharge     HPI/Subjective:  Patient in bed, not confused, no chest abdominal pain, no shortness of breath, no focal weakness.    Objective: Filed Vitals:   12/27/13 1517 12/27/13 1800 12/27/13 1955 12/28/13 0421  BP: 118/68 116/64 118/69 112/72  Pulse: 93 84 93 90  Temp: 98.8 F (37.1 C)  98.7 F (  37.1 C) 99.3 F (37.4 C)  TempSrc: Oral  Oral Oral  Resp: 18  18 18   Height:      Weight:    94.5 kg (208 lb 5.4 oz)  SpO2: 97%  99% 100%    Intake/Output Summary (Last 24 hours) at 12/28/13 1012 Last data filed at 12/28/13 0354  Gross per 24 hour  Intake      0 ml  Output   1500 ml  Net  -1500 ml    Exam:   General:  Pt is mildly confused, follows commands appropriately, not in acute  distress  Cardiovascular: Regular rate and rhythm, S1/S2, no murmurs, no rubs, no gallops  Respiratory: Clear to auscultation bilaterally, no wheezing, no crackles, no rhonchi  Abdomen: Soft but distended more today, bowel sounds present, no guarding, Foley in place  Extremities: upper and lower extremity edema, pulses DP and PT palpable bilaterally  Neuro: Grossly nonfocal    Data Reviewed:  Basic Metabolic Panel:   Recent Labs Lab 12/24/13 0520 12/25/13 0550 12/26/13 0846 12/27/13 0257 12/28/13 0551  NA 125* 126* 129* 130* 132*  K 4.2 4.0 4.3 4.3 4.3  CL 94* 93* 98 99 99  CO2 26 21 22 24 25   GLUCOSE 100* 111* 88 110* 98  BUN 13 14 10 9 7   CREATININE 0.89 0.77 0.71 0.69 0.69  CALCIUM 8.1* 8.4 7.7* 8.1* 8.0*   Liver Function Tests:  Recent Labs Lab 12/23/13 0450 12/26/13 0846 12/27/13 0257 12/28/13 0551  AST 66* 48* 42* 44*  ALT 36 31 27 26   ALKPHOS 107 104 96 103  BILITOT 1.1 1.2 1.2 1.1  PROT 5.8* 5.9* 5.8* 5.9*  ALBUMIN 1.8* 1.8* 2.0* 1.9*    Recent Labs Lab 12/27/13 0257 12/28/13 0550  AMMONIA 60 32   CBC:  Recent Labs Lab 12/23/13 0450 12/24/13 0520 12/25/13 0550 12/26/13 0846 12/27/13 0257  WBC 6.9 7.9 12.6* 7.7 6.1  HGB 10.0* 9.8* 10.4* 10.4* 9.5*  HCT 29.8* 29.6* 30.7* 29.4* 27.8*  MCV 89.2 89.2 89.2 86.7 89.1  PLT 84* 89* 100* 96* 87*   Scheduled Meds: . Chlorhexidine Gluconate Cloth  6 each Topical Q0600  . feeding supplement (ENSURE COMPLETE)  237 mL Oral TID WC  . folic acid  1 mg Oral Daily  . furosemide  40 mg Oral Daily  . lactulose  30 g Oral TID  . lidocaine  1 patch Transdermal Q24H  . LORazepam  0.5 mg Oral QHS,MR X 1  . multivitamin with minerals  1 tablet Oral Daily  . mupirocin ointment  1 application Nasal BID  . nicotine  14 mg Transdermal Daily  . pantoprazole  40 mg Oral BID  . rifaximin  550 mg Oral BID  . sodium chloride  3 mL Intravenous Q12H  . thiamine  100 mg Oral Daily   Continuous Infusions:    Thurnell Lose, MD   Pager [phone]  If 7PM-7AM, please contact night-coverage [email or web address] Password [email or web address] 12/28/2013, 10:12 AM   LOS: 18 days

## 2013-12-28 NOTE — Progress Notes (Signed)
Subjective: Pt feeling ok. S/p TIPS procedure 2/26 He denies much of any pain, denies SOB. Has been tolerating po intake very well.  Objective: Physical Exam: BP 112/72  Pulse 90  Temp(Src) 99.3 F (37.4 C) (Oral)  Resp 18  Ht 5\' 8"  (1.727 m)  Wt 208 lb 5.4 oz (94.5 kg)  BMI 31.68 kg/m2  SpO2 100% Very alert and coherent this am. Neck: (R)IJV site clean, no hematoma Lungs: CTA Heart: Reg Abd: still some distension but much less compared to a few days ago. Still some soft tissue edema lateral abd wall.  Ammonia level 32  Labs: CBC  Recent Labs  12/26/13 0846 12/27/13 0257  WBC 7.7 6.1  HGB 10.4* 9.5*  HCT 29.4* 27.8*  PLT 96* 87*   BMET  Recent Labs  12/27/13 0257 12/28/13 0551  NA 130* 132*  K 4.3 4.3  CL 99 99  CO2 24 25  GLUCOSE 110* 98  BUN 9 7  CREATININE 0.69 0.69  CALCIUM 8.1* 8.0*   LFT  Recent Labs  12/28/13 0551  PROT 5.9*  ALBUMIN 1.9*  AST 44*  ALT 26  ALKPHOS 103  BILITOT 1.1   PT/INR  Recent Labs  12/27/13 0257 12/28/13 0551  LABPROT 21.3* 18.6*  INR 1.91* 1.60*     Studies/Results: US Abdomen Limited  12/27/2013   CLINICAL DATA:  Evaluate abdominal ascites for a paracentesis.  EXAM: LIMITED ABDOMEN ULTRASOUND FOR ASCITES  TECHNIQUE: Limited ultrasound survey for ascites was performed in all four abdominal quadrants.  COMPARISON:  12/25/2013  FINDINGS: There is a small amount of ascites along the dome of the liver. Small amount of fluid in the paracolic gutters bilaterally and a small amount of fluid in the right lower quadrant.  IMPRESSION: Small amount of ascites in the abdomen. Paracentesis was not performed due to the small quantity of fluid.   Electronically Signed   By: Markus Daft M.D.   On: 12/27/2013 14:24   Ir Tips  12/26/2013   CLINICAL DATA:  53 year old with cirrhosis, hepatitis C and recurrent ascites. Patient has respiratory distress associated with the ascites.  EXAM: TRANSJUGULAR INTRAHEPATIC PORTOSYSTEMIC  SHUNT; IR PARACENTESIS  Physician: Stephan Minister. Anselm Pancoast, MD and Jacqulynn Cadet, MD  MEDICATIONS: General anesthesia  ANESTHESIA/SEDATION: The patient was monitored by anesthesia throughout the procedure  CONTRAST:  100 mL Omnipaque 300  FLUOROSCOPY TIME:  18 min and 18 seconds  PROCEDURE: The procedure was explained to the patient. The risks and benefits of the procedure were discussed and the patient's questions were addressed. Informed consent was obtained from the patient. The patient received 2 units of FFP for this procedure. The patient was intubated and placed under general anesthesia. The abdomen was evaluated with ultrasound. Patient was found to have perihepatic ascites. The abdomen was prepped and draped in sterile fashion. Maximal barrier sterile technique was utilized including caps, mask, sterile gowns, sterile gloves, sterile drape, hand hygiene and skin antiseptic. A Yeuh catheter was directed into the perihepatic fluid with ultrasound guidance. A paracentesis was performed. Approximately 3 L of blood-tinged fluid was removed during the procedure.  Attention was directed to accessing the portal venous system. The patient has known varices along the anterior abdomen that communicate with the left portal venous system. Prominent varices were identified in the anterior abdominal wall with ultrasound. Using sterile technique, a 21 gauge needle was directed into 1 of these varices with ultrasound guidance. A micropuncture dilator set was placed. Venography was performed. The contrast did  not reflux into the portal venous system. A branch more caudal was targeted with ultrasound guidance. A 21 gauge needle was directed into this vein with ultrasound guidance and a micropuncture dilator was placed. Additional venography was performed. Unable to cannulate the left portal venous system with a 0.018 wire. As result, these catheters were left in place and attention was directed to achieve transhepatic portal venous  access. Using ultrasound guidance, right portal vein was identified with ultrasound. A 22 gauge Chiba needle was directed into the right portal vein with ultrasound guidance. Contrast injection confirmed placement in the portal venous system. A wire was advanced into the main portal vein. A 3 French catheter was placed with a radiopaque wire within it. This catheter was secured to the skin.  The right side of the neck was prepped and draped in sterile fashion. Using ultrasound guidance, a 21 gauge needle was directed into the right internal jugular vein with ultrasound guidance. A micropuncture dilator set was placed and upsized to a 10 [place] sheath. A MPV catheter was used to cannulate a prominent hepatic vein which probably represents the middle hepatic vein. Using the 3 French portal vein catheter as a target, the TIPS needle was directed towards the central aspect of the right portal vein with fluoroscopic guidance. Contrast injection confirmed placement within the portal venous system. A hydrophilic wire was advanced into the portal venous system. Catheter and Amplatz wire with advanced into the portal venous system. Pigtail catheter was placed for a portal venogram. The tract was dilated with an 5 mm x 40 mm balloon. A 10 mm x 80 mm (60 mm covered) Viatorr stent was selected. The stent was advanced into the main portal vein. The uncovered portion was positioned in the portal venous system. The remainder of the stent was deployed. The stent was post dilated with an 8 mm x 40 mm balloon. Portal vein and systemic pressures were obtained. The 10 French vascular sheath was removed with manual compression. The transhepatic portal vein catheter, paracentesis catheter and anterior abdominal catheters were removed with manual compression. The patient was transferred to the PACU for recovery.  COMPLICATIONS: None  FINDINGS: Patient has multiple varices along the anterior abdominal wall that communicate with the left  portal venous system based on the CT. We attempted to use these veins as access to the portal venous system. The venography demonstrated that the flow was away from the liver and we were unable to reflux contrast into the portal venous system or cannulate the portal venous system with a wire. Right portal vein access was successfully achieved with a transhepatic approach using ultrasound and fluoroscopic guidance. The TIPS was created between a prominent hepatic vein (probably the middle hepatic vein) and the central aspect of the right portal vein. The TIPS stent is widely patent. The post-procedure portal vein pressure was 21 mmHg. The post TIPS pressure in the hepatic veins and the right atrium was between 14-15 mmHg. Therefore, the pressure gradient after TIPS was between 6-7 mmHg.  IMPRESSION: Successful TIPS procedure.   Electronically Signed   By: Markus Daft M.D.   On: 12/26/2013 15:21   Ir Paracentesis  12/26/2013   CLINICAL DATA:  53 year old with cirrhosis, hepatitis C and recurrent ascites. Patient has respiratory distress associated with the ascites.  EXAM: TRANSJUGULAR INTRAHEPATIC PORTOSYSTEMIC SHUNT; IR PARACENTESIS  Physician: Stephan Minister. Anselm Pancoast, MD and Jacqulynn Cadet, MD  MEDICATIONS: General anesthesia  ANESTHESIA/SEDATION: The patient was monitored by anesthesia throughout the procedure  CONTRAST:  100 mL Omnipaque 300  FLUOROSCOPY TIME:  18 min and 18 seconds  PROCEDURE: The procedure was explained to the patient. The risks and benefits of the procedure were discussed and the patient's questions were addressed. Informed consent was obtained from the patient. The patient received 2 units of FFP for this procedure. The patient was intubated and placed under general anesthesia. The abdomen was evaluated with ultrasound. Patient was found to have perihepatic ascites. The abdomen was prepped and draped in sterile fashion. Maximal barrier sterile technique was utilized including caps, mask, sterile  gowns, sterile gloves, sterile drape, hand hygiene and skin antiseptic. A Yeuh catheter was directed into the perihepatic fluid with ultrasound guidance. A paracentesis was performed. Approximately 3 L of blood-tinged fluid was removed during the procedure.  Attention was directed to accessing the portal venous system. The patient has known varices along the anterior abdomen that communicate with the left portal venous system. Prominent varices were identified in the anterior abdominal wall with ultrasound. Using sterile technique, a 21 gauge needle was directed into 1 of these varices with ultrasound guidance. A micropuncture dilator set was placed. Venography was performed. The contrast did not reflux into the portal venous system. A branch more caudal was targeted with ultrasound guidance. A 21 gauge needle was directed into this vein with ultrasound guidance and a micropuncture dilator was placed. Additional venography was performed. Unable to cannulate the left portal venous system with a 0.018 wire. As result, these catheters were left in place and attention was directed to achieve transhepatic portal venous access. Using ultrasound guidance, right portal vein was identified with ultrasound. A 22 gauge Chiba needle was directed into the right portal vein with ultrasound guidance. Contrast injection confirmed placement in the portal venous system. A wire was advanced into the main portal vein. A 3 French catheter was placed with a radiopaque wire within it. This catheter was secured to the skin.  The right side of the neck was prepped and draped in sterile fashion. Using ultrasound guidance, a 21 gauge needle was directed into the right internal jugular vein with ultrasound guidance. A micropuncture dilator set was placed and upsized to a 10 [place] sheath. A MPV catheter was used to cannulate a prominent hepatic vein which probably represents the middle hepatic vein. Using the 3 French portal vein catheter as a  target, the TIPS needle was directed towards the central aspect of the right portal vein with fluoroscopic guidance. Contrast injection confirmed placement within the portal venous system. A hydrophilic wire was advanced into the portal venous system. Catheter and Amplatz wire with advanced into the portal venous system. Pigtail catheter was placed for a portal venogram. The tract was dilated with an 5 mm x 40 mm balloon. A 10 mm x 80 mm (60 mm covered) Viatorr stent was selected. The stent was advanced into the main portal vein. The uncovered portion was positioned in the portal venous system. The remainder of the stent was deployed. The stent was post dilated with an 8 mm x 40 mm balloon. Portal vein and systemic pressures were obtained. The 10 French vascular sheath was removed with manual compression. The transhepatic portal vein catheter, paracentesis catheter and anterior abdominal catheters were removed with manual compression. The patient was transferred to the PACU for recovery.  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 We attempted to use these veins as access to  the portal venous system. The venography demonstrated that the flow was away from the liver and we were unable to reflux contrast into the portal venous system or cannulate the portal venous system with a wire. Right portal vein access was successfully achieved with a transhepatic approach using ultrasound and fluoroscopic guidance. The TIPS was created between a prominent hepatic vein (probably the middle hepatic vein) and the central aspect of the right portal vein. The TIPS stent is widely patent. The post-procedure portal vein pressure was 21 mmHg. The post TIPS pressure in the hepatic veins and the right atrium was between 14-15 mmHg. Therefore, the pressure gradient after TIPS was between 6-7 mmHg.  IMPRESSION: Successful  TIPS procedure.   Electronically Signed   By: Markus Daft M.D.   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 S/p TIPS 2/26 Doing well, no encephalopathy Small volume of refractory ascites but do not feel paracentesis is indicated at this time. May need repeat paracentesis as an outpt if becomes symptomatic with discomfort/SOB Stable for discharge from IR standpoint. Will coordinate follow for pt to see Dr. Anselm Pancoast as an outpt iin 6 weeks.    LOS: 18 days    Ting Cage PA-C 12/28/2013 11:00 AM

## 2013-12-29 LAB — COMPREHENSIVE METABOLIC PANEL
ALK PHOS: 102 U/L (ref 39–117)
ALT: 26 U/L (ref 0–53)
AST: 48 U/L — AB (ref 0–37)
Albumin: 1.9 g/dL — ABNORMAL LOW (ref 3.5–5.2)
BUN: 6 mg/dL (ref 6–23)
CALCIUM: 8.2 mg/dL — AB (ref 8.4–10.5)
CO2: 24 mEq/L (ref 19–32)
Chloride: 103 mEq/L (ref 96–112)
Creatinine, Ser: 0.66 mg/dL (ref 0.50–1.35)
GFR calc Af Amer: >90 mL/min (ref >90)
GFR calc non Af Amer: >90 mL/min (ref >90)
GLUCOSE: 94 mg/dL (ref 70–99)
POTASSIUM: 4.4 meq/L (ref 3.7–5.3)
SODIUM: 136 meq/L — AB (ref 137–147)
TOTAL PROTEIN: 5.9 g/dL — AB (ref 6.0–8.3)
Total Bilirubin: 1 mg/dL (ref 0.3–1.2)

## 2013-12-29 LAB — PROTIME-INR
INR: 1.89 — AB (ref 0.00–1.49)
Prothrombin Time: 21.1 seconds — ABNORMAL HIGH (ref 11.6–15.2)

## 2013-12-29 MED ORDER — FENTANYL CITRATE 0.05 MG/ML IJ SOLN
25.0000 ug | INTRAMUSCULAR | Status: DC | PRN
  Filled 2013-12-29: qty 2

## 2013-12-29 NOTE — Progress Notes (Signed)
Subjective: No acute events.  Feeling better.  Objective: Vital signs in last 24 hours: Temp:  [97.4 F (36.3 C)-99.3 F (37.4 C)] 97.4 F (36.3 C) (03/01 0507) Pulse Rate:  [84-94] 89 (03/01 0507) Resp:  [18] 18 (03/01 0507) BP: (110-122)/(66-72) 110/66 mmHg (03/01 0507) SpO2:  [97 %-100 %] 100 % (03/01 0507) Weight:  [203 lb 7.8 oz (92.3 kg)] 203 lb 7.8 oz (92.3 kg) (03/01 0507) Last BM Date: 12/29/13  Intake/Output from previous day: 02/28 0701 - 03/01 0700 In: 594 [P.O.:120] Out: 1520 [Urine:1520] Intake/Output this shift:    General appearance: alert and no distress GI: distended with ascites, but softer  Lab Results:  Recent Labs  12/27/13 0257  WBC 6.1  HGB 9.5*  HCT 27.8*  PLT 87*   BMET  Recent Labs  12/27/13 0257 12/28/13 0551 12/29/13 0605  NA 130* 132* 136*  K 4.3 4.3 4.4  CL 99 99 103  CO2 24 25 24   GLUCOSE 110* 98 94  BUN 9 7 6   CREATININE 0.69 0.69 0.66  CALCIUM 8.1* 8.0* 8.2*   LFT  Recent Labs  12/29/13 0605  PROT 5.9*  ALBUMIN 1.9*  AST 48*  ALT 26  ALKPHOS 102  BILITOT 1.0   PT/INR  Recent Labs  12/28/13 0551 12/29/13 0605  LABPROT 18.6* 21.1*  INR 1.60* 1.89*   Hepatitis Panel No results found for this basename: HEPBSAG, HCVAB, HEPAIGM, HEPBIGM,  in the last 72 hours C-Diff No results found for this basename: CDIFFTOX,  in the last 72 hours Fecal Lactopherrin No results found for this basename: FECLLACTOFRN,  in the last 72 hours  Studies/Results: US Abdomen Limited  12/27/2013   CLINICAL DATA:  Evaluate abdominal ascites for a paracentesis.  EXAM: LIMITED ABDOMEN ULTRASOUND FOR ASCITES  TECHNIQUE: Limited ultrasound survey for ascites was performed in all four abdominal quadrants.  COMPARISON:  12/25/2013  FINDINGS: There is a small amount of ascites along the dome of the liver. Small amount of fluid in the paracolic gutters bilaterally and a small amount of fluid in the right lower quadrant.  IMPRESSION: Small  amount of ascites in the abdomen. Paracentesis was not performed due to the small quantity of fluid.   Electronically Signed   By: Markus Daft M.D.   On: 12/27/2013 14:24    Medications:  Scheduled: . Chlorhexidine Gluconate Cloth  6 each Topical Q0600  . feeding supplement (ENSURE COMPLETE)  237 mL Oral TID WC  . folic acid  1 mg Oral Daily  . furosemide  40 mg Oral Daily  . lactulose  30 g Oral TID  . lidocaine  1 patch Transdermal Q24H  . LORazepam  0.5 mg Oral QHS,MR X 1  . multivitamin with minerals  1 tablet Oral Daily  . mupirocin ointment  1 application Nasal BID  . nicotine  14 mg Transdermal Daily  . pantoprazole  40 mg Oral BID  . rifaximin  550 mg Oral BID  . sodium chloride  3 mL Intravenous Q12H  . thiamine  100 mg Oral Daily   Continuous:   Assessment/Plan: 1) Refractory ascites s/p TIPS. 2) HCV/ETOH cirrhosis.   Subjectively I feel the patient's abdomen is softer.  I was not able to find any weights and today I weighed him at 94 kg in bed.  His weight will be an accurate indicator of improvement with his ascites.  No evidence of hepatic encephalopathy.  Plan: 1) Continue with diuretics. 2) Continue with Xifaxan.  3)  [facility] to assume care in the AM.  LOS: 19 days   Tomasina Keasling D 12/29/2013, 12:07 PM

## 2013-12-29 NOTE — Progress Notes (Signed)
CSW (Clinical Education officer, museum) aware of consult but after reviewing chart and speaking with pt nurse today it seems as though plan is for pt to dc home in agreement with PT recommendation. At this time, no CSW needs. Please reconsult if new needs arise.  [place], [place]

## 2013-12-29 NOTE — Progress Notes (Signed)
Patient ID: Oscar Swanson, male   DOB: [birth year], 53 y.o.   MRN: [id number]  [facility] PROGRESS NOTE  DAKWON WENBERG KGU:[id number] DOB: Apr 04, 1961 DOA: 12/10/2013 PCP: Angelica Chessman, MD   Brief narrative:   53 y.o. male with a past medical history of alcohol/HCV cirrhosis, hepatic encephalopathy, intractible ascitis, esophageal varices, chronic thrombocytopenia who presented with complaints of abdominal distention and shortness of breath. He was seen at GI clinic initially and was recommended for admission for tense ascites. Had paracentesis on 12/21/13 with removal of 6L of ascitic fluid. Given respiratory compromise from rapidly accumulating ascitis, GI recommending TIPS. Doppler US showing patent vessels.  Interventional radiology and GI recommending transfer to [facility] for TIPs. Underwent TIPS on 12-26-13.       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 Continue Lasix, spironolactone, Xifaxan and lactulose - abdomen is tension essentially stable after TIPS on 12/26/2013, Total of 24 liters of ascites removed so far (once on 2/11, second on 2/13, third 2/19, fourth on 2/21)  - GI following the patient     Esophageal varices with bleeding  - currently stable, continue Protonix for now  - appreciate GI input, needed EGD with band ligation x 5 in January 2015, TIPS done on 2-26     Acute hypoxic respiratory failure  - secondary to PNA - completed therapy with Levaquin for 7 days,  shortness of breath at this point secondary to severe ascites and resulting abdominal distension      Hyponatremia  - due to cirrhosis and improved with diuresis and paracentesis  - chronic and secondary to volume status, third spacing, use of Lasix  - overall stable and better over the past few days, sodium improved     Severe  malnutrition  - secondary to progressive liver cirrhosis and FTT - continue 2 gram sodium diet, nutrition consultations appreciated      Hepatitis C  - CMET overall stable, Positive viral load, GI following     Anemia of chronic disease  - no signs of active bleeding      Thrombocytopenia  - secondary to alcohol induced bone marrow damage, no signs of active bleeding      Consultants:  IR for US guided paracentesis  GI     Procedures/Studies:  02/11 --> US guided paracentesis, 6 L ascites removed  02/13 --> US guided paracentesis, 6 L ascites removed  02/19 --> US guided paracentesis, 6 L ascites removed  02/21 --> US guided paracentesis, 6 L ascites removed 2/26 - TIPs by IR  Varices along the abdominal wall were cannulated but unable to reflux into portal venous system. Portal venous system is patent. TIPS created between right hepatic vein and central aspect of right portal vein. Pressure gradient after TIPS creation was between 6-7 mmHg.     Antibiotics:  Levaquin 2/12 --> 2/18    Code Status: Full  Family Communication: Pt at bedside  Disposition Plan: Home when medically stable, not ready yet for discharge     HPI/Subjective:  Patient in bed, not confused, no chest abdominal pain, no shortness of breath, no focal weakness.    Objective: Filed Vitals:   12/28/13 1520 12/28/13 1900 12/28/13 2214 12/29/13 0507  BP: 117/70 115/72 122/68 110/66  Pulse: 94 84 94 89  Temp: 99.3 F (37.4 C)  98.4 F (36.9 C) 97.4  F (36.3 C)  TempSrc: Oral  Oral Oral  Resp: 18  18 18   Height:      Weight:    92.3 kg (203 lb 7.8 oz)  SpO2: 97%  97% 100%    Intake/Output Summary (Last 24 hours) at 12/29/13 0955 Last data filed at 12/29/13 9417  Gross per 24 hour  Intake    594 ml  Output   1520 ml  Net   -926 ml    Exam:   General:  Pt is mildly confused, follows commands appropriately, not in acute distress  Cardiovascular: Regular rate and rhythm,  S1/S2, no murmurs, no rubs, no gallops  Respiratory: Clear to auscultation bilaterally, no wheezing, no crackles, no rhonchi  Abdomen: Soft but mildly distended, bowel sounds present, no guarding, Foley in place  Extremities: upper and lower extremity edema, pulses DP and PT palpable bilaterally  Neuro: Grossly nonfocal    Data Reviewed:  Basic Metabolic Panel:   Recent Labs Lab 12/25/13 0550 12/26/13 0846 12/27/13 0257 12/28/13 0551 12/29/13 0605  NA 126* 129* 130* 132* 136*  K 4.0 4.3 4.3 4.3 4.4  CL 93* 98 99 99 103  CO2 21 22 24 25 24   GLUCOSE 111* 88 110* 98 94  BUN 14 10 9 7 6   CREATININE 0.77 0.71 0.69 0.69 0.66  CALCIUM 8.4 7.7* 8.1* 8.0* 8.2*   Liver Function Tests:  Recent Labs Lab 12/23/13 0450 12/26/13 0846 12/27/13 0257 12/28/13 0551 12/29/13 0605  AST 66* 48* 42* 44* 48*  ALT 36 31 27 26 26   ALKPHOS 107 104 96 103 102  BILITOT 1.1 1.2 1.2 1.1 1.0  PROT 5.8* 5.9* 5.8* 5.9* 5.9*  ALBUMIN 1.8* 1.8* 2.0* 1.9* 1.9*    Recent Labs Lab 12/27/13 0257 12/28/13 0550  AMMONIA 60 32   CBC:  Recent Labs Lab 12/23/13 0450 12/24/13 0520 12/25/13 0550 12/26/13 0846 12/27/13 0257  WBC 6.9 7.9 12.6* 7.7 6.1  HGB 10.0* 9.8* 10.4* 10.4* 9.5*  HCT 29.8* 29.6* 30.7* 29.4* 27.8*  MCV 89.2 89.2 89.2 86.7 89.1  PLT 84* 89* 100* 96* 87*   Scheduled Meds: . Chlorhexidine Gluconate Cloth  6 each Topical Q0600  . feeding supplement (ENSURE COMPLETE)  237 mL Oral TID WC  . folic acid  1 mg Oral Daily  . furosemide  40 mg Oral Daily  . lactulose  30 g Oral TID  . lidocaine  1 patch Transdermal Q24H  . LORazepam  0.5 mg Oral QHS,MR X 1  . multivitamin with minerals  1 tablet Oral Daily  . mupirocin ointment  1 application Nasal BID  . nicotine  14 mg Transdermal Daily  . pantoprazole  40 mg Oral BID  . rifaximin  550 mg Oral BID  . sodium chloride  3 mL Intravenous Q12H  . thiamine  100 mg Oral Daily   Continuous Infusions:   Thurnell Lose,  MD   Pager [phone]  If 7PM-7AM, please contact night-coverage [email or web address] Password [email or web address] 12/29/2013, 9:55 AM   LOS: 19 days

## 2013-12-29 NOTE — Progress Notes (Signed)
12/29/13  Went into room to give medication ask patient to rolll up his shirt sleeves noted no IV sites. Patient stated they came out last night.

## 2013-12-30 LAB — PROTIME-INR
INR: 1.8 — ABNORMAL HIGH (ref 0.00–1.49)
Prothrombin Time: 20.4 seconds — ABNORMAL HIGH (ref 11.6–15.2)

## 2013-12-30 LAB — CBC
HEMATOCRIT: 28.8 % — AB (ref 39.0–52.0)
Hemoglobin: 9.8 g/dL — ABNORMAL LOW (ref 13.0–17.0)
MCH: 29.9 pg (ref 26.0–34.0)
MCHC: 34 g/dL (ref 30.0–36.0)
MCV: 87.8 fL (ref 78.0–100.0)
Platelets: 70 10*3/uL — ABNORMAL LOW (ref 150–400)
RBC: 3.28 MIL/uL — ABNORMAL LOW (ref 4.22–5.81)
RDW: 17.6 % — AB (ref 11.5–15.5)
WBC: 4.1 10*3/uL (ref 4.0–10.5)

## 2013-12-30 LAB — COMPREHENSIVE METABOLIC PANEL
ALT: 30 U/L (ref 0–53)
AST: 57 U/L — ABNORMAL HIGH (ref 0–37)
Albumin: 1.8 g/dL — ABNORMAL LOW (ref 3.5–5.2)
Alkaline Phosphatase: 106 U/L (ref 39–117)
BUN: 7 mg/dL (ref 6–23)
CO2: 24 mEq/L (ref 19–32)
Calcium: 8.1 mg/dL — ABNORMAL LOW (ref 8.4–10.5)
Chloride: 100 mEq/L (ref 96–112)
Creatinine, Ser: 0.65 mg/dL (ref 0.50–1.35)
GLUCOSE: 95 mg/dL (ref 70–99)
POTASSIUM: 4.2 meq/L (ref 3.7–5.3)
SODIUM: 132 meq/L — AB (ref 137–147)
TOTAL PROTEIN: 5.9 g/dL — AB (ref 6.0–8.3)
Total Bilirubin: 0.9 mg/dL (ref 0.3–1.2)

## 2013-12-30 MED ORDER — ENSURE COMPLETE PO LIQD: 237.0000 mL | Freq: Three times a day (TID) | ORAL | Status: DC

## 2013-12-30 MED ORDER — OXYCODONE HCL 5 MG PO TABS: 5.0000 mg | ORAL_TABLET | Freq: Four times a day (QID) | ORAL | Status: DC | PRN

## 2013-12-30 MED ORDER — NICOTINE 14 MG/24HR TD PT24: 14.0000 mg | MEDICATED_PATCH | Freq: Every day | TRANSDERMAL | Status: DC

## 2013-12-30 MED ORDER — ONDANSETRON HCL 4 MG PO TABS: 4.0000 mg | ORAL_TABLET | Freq: Four times a day (QID) | ORAL | Status: DC | PRN

## 2013-12-30 MED ORDER — RIFAXIMIN 550 MG PO TABS: 550.0000 mg | ORAL_TABLET | Freq: Two times a day (BID) | ORAL | Status: DC

## 2013-12-30 NOTE — Clinical Social Work Note (Signed)
CSW received call from [place] with [facility]. Reino Bellis states that the facility cannot make a bed offer on patient the facility does not see a need for patient to go to SNF after reviewing most recent progress notes. Patient is to be discharged home with his sister. RNCM aware. CSW signing off at this time.  [place], [place], [place], [phone]

## 2013-12-30 NOTE — Discharge Summary (Addendum)
Oscar Swanson, is a 53 y.o. male  DOB [birth year]  MRN [id number].  Admission date:  12/10/2013  Admitting Physician  Thurnell Lose, MD  Discharge Date:  12/30/2013   Primary MD  Angelica Chessman, MD  Recommendations for primary care physician for things to follow:   Monitor CBC, CMP and ammonia levels intermittently. Monitor ascites and diuretic requirement.   Admission Diagnosis  Shortness of breath [786.05] Cirrhosis [571.5] Esophageal varices [456.1] CHF exacerbation [428.0] HCAP (healthcare-associated pneumonia) [486] Ascites [789.59] Chest pain [786.50]   Discharge Diagnosis  Shortness of breath [786.05] Cirrhosis [571.5] Esophageal varices [456.1] CHF exacerbation [428.0] HCAP (healthcare-associated pneumonia) [486] Ascites [789.59] Chest pain [786.50]     Principal Problem:   Ascites Active Problems:   Smoking   Cirrhosis with alcoholism   Mild Coagulopathy   Acute respiratory failure   Esophageal varices with bleeding   Hepatitis C   Cellulitis   Acute CHF   HCAP (healthcare-associated pneumonia)   Hyponatremia      Past Medical History  Diagnosis Date  . Diabetes mellitus without complication   . Schizophrenia     onset age 28.  in and out of psych facilities from age 38 to age 30.  s/p electroconvulsive therapy.  . TB (tuberculosis)     sister says this dx'd at age 54 and treated  . Hepatic encephalopathy   . Esophageal varices   . GI bleed   . Cirrhosis   . Thrombocytopenia   . Alcohol abuse     Past Surgical History  Procedure Laterality Date  . Cataract surgery  Bilateral   . Esophagogastroduodenoscopy N/A 11/08/2013    Procedure: ESOPHAGOGASTRODUODENOSCOPY (EGD);  Surgeon: Milus Banister, MD;  Location: [facility];  Service: Endoscopy;  Laterality: N/A;  bedside      Discharge Condition: Stable   Follow UP  Follow-up Information   Follow up with JEGEDE, OLUGBEMIGA, MD. Schedule an appointment as soon as possible for a visit in 1 week.   Specialty:  Internal Medicine   Contact information:   [place] [address] [phone]       Follow up with EDWARDS JR,JAMES L, MD. Schedule an appointment as soon as possible for a visit in 1 week.   Specialty:  Gastroenterology   Contact information:   [place] [address] [phone]       Follow up with Carylon Perches, MD. Schedule an appointment as soon as possible for a visit in 1 week.   Specialty:  Interventional Radiology   Contact information:   [place] [address] [phone]         Discharge Instructions  and  Discharge Medications      Discharge Orders   Future Orders Complete By Expires   Discharge instructions  As directed  Comments:     Follow with Primary MD JEGEDE, OLUGBEMIGA, MD in 7 days   Get CBC, CMP, checked 7 days by Primary MD and again as instructed by your Primary MD.     Activity: As tolerated with Full fall precautions use walker/cane & assistance as needed   Disposition Home ( refused SNF)   Diet: Heart Healthy - Check your Weight same time everyday, if you gain over 2 pounds, or you develop in leg swelling, experience more shortness of breath or chest pain, call your Primary MD immediately. Follow Cardiac Low Salt Diet and 1.5 lit/day fluid restriction.   On your next visit with her primary care physician please Get Medicines reviewed and adjusted.  Please request your Prim.MD to go over all Hospital Tests and Procedure/Radiological results at the follow up, please get all Hospital records sent to your Prim MD by signing hospital release before you go home.   If you experience worsening of your admission symptoms, develop shortness of breath, life  threatening emergency, suicidal or homicidal thoughts you must seek medical attention immediately by calling 911 or calling your MD immediately  if symptoms less severe.  You Must read complete instructions/literature along with all the possible adverse reactions/side effects for all the Medicines you take and that have been prescribed to you. Take any new Medicines after you have completely understood and accpet all the possible adverse reactions/side effects.   Do not drive and provide baby sitting services if your were admitted for syncope or siezures until you have seen by Primary MD or a Neurologist and advised to do so again.  Do not drive when taking Pain medications.    Do not take more than prescribed Pain, Sleep and Anxiety Medications  Special Instructions: If you have smoked or chewed Tobacco  in the last 2 yrs please stop smoking, stop any regular Alcohol  and or any Recreational drug use.  Wear Seat belts while driving.   Please note  You were cared for by a hospitalist during your hospital stay. If you have any questions about your discharge medications or the care you received while you were in the hospital after you are discharged, you can call the unit and asked to speak with the hospitalist on call if the hospitalist that took care of you is not available. Once you are discharged, your primary care physician will handle any further medical issues. Please note that NO REFILLS for any discharge medications will be authorized once you are discharged, as it is imperative that you return to your primary care physician (or establish a relationship with a primary care physician if you do not have one) for your aftercare needs so that they can reassess your need for medications and monitor your lab values.   Increase activity slowly  As directed        Medication List    STOP taking these medications       sulfamethoxazole-trimethoprim 800-160 MG per tablet  Commonly known as:   BACTRIM DS      TAKE these medications       beclomethasone 80 MCG/ACT inhaler  Commonly known as:  QVAR  Inhale 2 puffs into the lungs 2 (two) times daily.     feeding supplement (ENSURE COMPLETE) Liqd  Take 237 mLs by mouth 3 (three) times daily with meals.     folic acid 1 MG tablet  Commonly known as:  FOLVITE  Take 1 tablet (1 mg total) by mouth daily.  furosemide 40 MG tablet  Commonly known as:  LASIX  Take 1 tablet (40 mg total) by mouth 2 (two) times daily.     lactulose 10 GM/15ML solution  Commonly known as:  CHRONULAC  Take 30 mLs (20 g total) by mouth 3 (three) times daily.     nicotine 14 mg/24hr patch  Commonly known as:  NICODERM CQ - dosed in mg/24 hours  Place 1 patch (14 mg total) onto the skin daily.     ondansetron 4 MG tablet  Commonly known as:  ZOFRAN  Take 1 tablet (4 mg total) by mouth every 6 (six) hours as needed for nausea.     oxyCODONE 5 MG immediate release tablet  Commonly known as:  Oxy IR/ROXICODONE  Take 1 tablet (5 mg total) by mouth every 6 (six) hours as needed for severe pain.     pantoprazole 40 MG tablet  Commonly known as:  PROTONIX  Take 40 mg by mouth 2 (two) times daily.     rifaximin 550 MG Tabs tablet  Commonly known as:  XIFAXAN  Take 1 tablet (550 mg total) by mouth 2 (two) times daily.     spironolactone 100 MG tablet  Commonly known as:  ALDACTONE  Take 2 tablets (200 mg total) by mouth daily.     thiamine 100 MG tablet  Commonly known as:  VITAMIN B-1  Take 1 tablet (100 mg total) by mouth daily.          Diet and Activity recommendation: See Discharge Instructions above   Consults obtained - IR, GI   Major procedures and Radiology Reports - PLEASE review detailed and final reports for all details, in brief -       Procedures/Studies:   02/11 --> US guided paracentesis, 6 L ascites removed  02/13 --> US guided paracentesis, 6 L ascites removed  02/19 --> US guided paracentesis, 6 L ascites  removed  02/21 --> US guided paracentesis, 6 L ascites removed  2/26 - TIPs by IR Varices along the abdominal wall were cannulated but unable to reflux into portal venous system. Portal venous system is patent. TIPS created between right hepatic vein and central aspect of right portal vein. Pressure gradient after TIPS creation was between 6-7 mmHg.     Dg Chest 2 View  12/25/2013   CLINICAL DATA:  Leukocytosis.  EXAM: CHEST  2 VIEW  COMPARISON:  DG CHEST 2 VIEW dated 12/21/2013; DG CHEST 2 VIEW dated 12/10/2013  FINDINGS: Mediastinum hilar structures are normal. Mild basilar atelectasis noted. Previously identified infiltrate in the right lung base continues to clear with near complete clearing. Pleural effusions have almost completely cleared. No pneumothorax. Heart size and pulmonary vascularity normal. No acute osseous abnormality.  IMPRESSION: Mild basilar atelectasis. Near complete clearing of right base infiltrate and pleural effusions.   Electronically Signed   By: Marcello Moores  Register   On: 12/25/2013 15:57     Ct Abdomen Pelvis W Contrast  12/10/2013   CLINICAL DATA:  Abdomen pain and distention  EXAM: CT ABDOMEN AND PELVIS WITH CONTRAST  TECHNIQUE: Multidetector CT imaging of the abdomen and pelvis was performed using the standard protocol following bolus administration of intravenous contrast.  CONTRAST:  154mL OMNIPAQUE IOHEXOL 300 MG/ML  SOLN  COMPARISON:  November 09, 2013  FINDINGS: There is interval developed marked ascites in the abdomen and pelvis. There is nodular contour of the liver consistent with known cirrhosis of liver. Gallstones are noted in the gallbladder. Spleen is  stable. The pancreas adrenal glands and kidneys are normal. There is no hydronephrosis bilaterally. There is mild atherosclerosis of the abdominal aorta without aneurysmal dilatation. There is no small bowel obstruction or diverticulitis. Fluid and stranding is identified within the subcutaneous fat of the abdomen and  pelvis. There is moderate right pleural effusion with compression atelectasis of adjacent posterior right lung base. The left lung is clear. Degenerative joint changes of the spine are noted.  IMPRESSION: Interval developed marked ascites in the abdomen and pelvis. Cirrhosis of liver. Cholelithiasis.   Electronically Signed   By: Abelardo Diesel M.D.   On: 12/10/2013 19:04   US Abdomen Limited  12/27/2013   CLINICAL DATA:  Evaluate abdominal ascites for a paracentesis.  EXAM: LIMITED ABDOMEN ULTRASOUND FOR ASCITES  TECHNIQUE: Limited ultrasound survey for ascites was performed in all four abdominal quadrants.  COMPARISON:  12/25/2013  FINDINGS: There is a small amount of ascites along the dome of the liver. Small amount of fluid in the paracolic gutters bilaterally and a small amount of fluid in the right lower quadrant.  IMPRESSION: Small amount of ascites in the abdomen. Paracentesis was not performed due to the small quantity of fluid.   Electronically Signed   By: Markus Daft M.D.   On: 12/27/2013 14:24   [place] Art/ven Flow Abd Pelv Doppler  12/24/2013   CLINICAL DATA:  Cirrhosis, ascites  EXAM: DUPLEX ULTRASOUND OF LIVER  TECHNIQUE: Color and duplex Doppler ultrasound was performed to evaluate the hepatic in-flow and out-flow vessels.  COMPARISON:  12/21/2013, 12/10/2013  FINDINGS: Portal Vein Velocities  Main:  23 cm/sec  Right:  32 cm/sec  Left:  28 cm/sec  Hepatic Vein Velocities  Right:  25 cm/sec  Middle:  23 cm/sec  Left:  23 cm/sec  Hepatic Artery Velocity:  102 cm/sec  Splenic Vein Velocity:  25 cm/sec  Varices: Not visualized  Ascites: Present  Patent portal, hepatic and splenic veins. Normal directional flow. Hepatic cirrhosis noted. Negative for portal vein thrombus or occlusion. Diffuse abdominal ascites.  IMPRESSION: Patent portal, hepatic and splenic veins.  Hepatic cirrhosis  Ascites   Electronically Signed   By: Daryll Brod M.D.   On: 12/24/2013 15:58   US Paracentesis  12/25/2013    CLINICAL DATA:  Hepatitis-C, cirrhosis, recurrent ascites. Request is made for diagnostic and therapeutic paracentesis.  EXAM: ULTRASOUND GUIDED DIAGNOSTIC AND THERAPEUTIC PARACENTESIS  COMPARISON:  PREVIOUS PARACENTESIS ON 12/22/2013.  PROCEDURE: An ultrasound guided paracentesis was thoroughly discussed with the patient and questions answered. The benefits, risks, alternatives and complications were also discussed. The patient understands and wishes to proceed with the procedure. Written consent was obtained.  Ultrasound was performed to localize and mark an adequate pocket of fluid in the left lower quadrant of the abdomen. The area was then prepped and draped in the normal sterile fashion. 1% Lidocaine was used for local anesthesia. Under ultrasound guidance a 19 gauge Yueh catheter was introduced. Paracentesis was performed. The catheter was removed and a dressing applied.  Complications: None.  FINDINGS: A total of approximately 4.7 liters of turbid, light amber fluid was removed. A fluid sample was sent for laboratory analysis.  IMPRESSION: Successful ultrasound guided diagnostic and therapeutic paracentesis yielding 4.7 liters of ascites.  Read by: Rowe Robert ,P.A.-C.   Electronically Signed   By: Markus Daft M.D.   On: 12/25/2013 16:08        Ir Tips  12/26/2013   CLINICAL DATA:  53 year old with cirrhosis, hepatitis C and  recurrent ascites. Patient has respiratory distress associated with the ascites.  EXAM: TRANSJUGULAR INTRAHEPATIC PORTOSYSTEMIC SHUNT; IR PARACENTESIS  Physician: Stephan Minister. Anselm Pancoast, MD and Jacqulynn Cadet, MD  MEDICATIONS: General anesthesia  ANESTHESIA/SEDATION: The patient was monitored by anesthesia throughout the procedure  CONTRAST:  100 mL Omnipaque 300  FLUOROSCOPY TIME:  18 min and 18 seconds  PROCEDURE: The procedure was explained to the patient. The risks and benefits of the procedure were discussed and the patient's questions were addressed. Informed consent was obtained  from the patient. The patient received 2 units of FFP for this procedure. The patient was intubated and placed under general anesthesia. The abdomen was evaluated with ultrasound. Patient was found to have perihepatic ascites. The abdomen was prepped and draped in sterile fashion. Maximal barrier sterile technique was utilized including caps, mask, sterile gowns, sterile gloves, sterile drape, hand hygiene and skin antiseptic. A Yeuh catheter was directed into the perihepatic fluid with ultrasound guidance. A paracentesis was performed. Approximately 3 L of blood-tinged fluid was removed during the procedure.  Attention was directed to accessing the portal venous system. The patient has known varices along the anterior abdomen that communicate with the left portal venous system. Prominent varices were identified in the anterior abdominal wall with ultrasound. Using sterile technique, a 21 gauge needle was directed into 1 of these varices with ultrasound guidance. A micropuncture dilator set was placed. Venography was performed. The contrast did not reflux into the portal venous system. A branch more caudal was targeted with ultrasound guidance. A 21 gauge needle was directed into this vein with ultrasound guidance and a micropuncture dilator was placed. Additional venography was performed. Unable to cannulate the left portal venous system with a 0.018 wire. As result, these catheters were left in place and attention was directed to achieve transhepatic portal venous access. Using ultrasound guidance, right portal vein was identified with ultrasound. A 22 gauge Chiba needle was directed into the right portal vein with ultrasound guidance. Contrast injection confirmed placement in the portal venous system. A wire was advanced into the main portal vein. A 3 French catheter was placed with a radiopaque wire within it. This catheter was secured to the skin.  The right side of the neck was prepped and draped in sterile  fashion. Using ultrasound guidance, a 21 gauge needle was directed into the right internal jugular vein with ultrasound guidance. A micropuncture dilator set was placed and upsized to a 10 [place] sheath. A MPV catheter was used to cannulate a prominent hepatic vein which probably represents the middle hepatic vein. Using the 3 French portal vein catheter as a target, the TIPS needle was directed towards the central aspect of the right portal vein with fluoroscopic guidance. Contrast injection confirmed placement within the portal venous system. A hydrophilic wire was advanced into the portal venous system. Catheter and Amplatz wire with advanced into the portal venous system. Pigtail catheter was placed for a portal venogram. The tract was dilated with an 5 mm x 40 mm balloon. A 10 mm x 80 mm (60 mm covered) Viatorr stent was selected. The stent was advanced into the main portal vein. The uncovered portion was positioned in the portal venous system. The remainder of the stent was deployed. The stent was post dilated with an 8 mm x 40 mm balloon. Portal vein and systemic pressures were obtained. The 10 French vascular sheath was removed with manual compression. The transhepatic portal vein catheter, paracentesis catheter and anterior abdominal catheters were removed with  manual compression. The patient was transferred to the PACU for recovery.  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 We attempted to use these veins as access to the portal venous system. The venography demonstrated that the flow was away from the liver and we were unable to reflux contrast into the portal venous system or cannulate the portal venous system with a wire. Right portal vein access was successfully achieved with a transhepatic approach using ultrasound and fluoroscopic guidance. The TIPS was created between a prominent  hepatic vein (probably the middle hepatic vein) and the central aspect of the right portal vein. The TIPS stent is widely patent. The post-procedure portal vein pressure was 21 mmHg. The post TIPS pressure in the hepatic veins and the right atrium was between 14-15 mmHg. Therefore, the pressure gradient after TIPS was between 6-7 mmHg.  IMPRESSION: Successful TIPS procedure.   Electronically Signed   By: Markus Daft M.D.   On: 12/26/2013 15:21   Dg Chest Port 1 View  12/26/2013   CLINICAL DATA:  Cough, smoker  EXAM: PORTABLE CHEST - 1 VIEW  COMPARISON:  12/25/2013  FINDINGS: Cardiomediastinal silhouette is stable. Mild basilar streaky atelectasis. Minimal central bronchitic changes. No focal infiltrate. No pulmonary edema.  IMPRESSION: Mild basilar streaky atelectasis. No segmental infiltrate. No pulmonary edema.   Electronically Signed   By: Lahoma Crocker M.D.   On: 12/26/2013 08:18   Ir Paracentesis  12/26/2013   CLINICAL DATA:  53 year old with cirrhosis, hepatitis C and recurrent ascites. Patient has respiratory distress associated with the ascites.  EXAM: TRANSJUGULAR INTRAHEPATIC PORTOSYSTEMIC SHUNT; IR PARACENTESIS  Physician: Stephan Minister. Anselm Pancoast, MD and Jacqulynn Cadet, MD  MEDICATIONS: General anesthesia  ANESTHESIA/SEDATION: The patient was monitored by anesthesia throughout the procedure  CONTRAST:  100 mL Omnipaque 300  FLUOROSCOPY TIME:  18 min and 18 seconds  PROCEDURE: The procedure was explained to the patient. The risks and benefits of the procedure were discussed and the patient's questions were addressed. Informed consent was obtained from the patient. The patient received 2 units of FFP for this procedure. The patient was intubated and placed under general anesthesia. The abdomen was evaluated with ultrasound. Patient was found to have perihepatic ascites. The abdomen was prepped and draped in sterile fashion. Maximal barrier sterile technique was utilized including caps, mask, sterile gowns, sterile  gloves, sterile drape, hand hygiene and skin antiseptic. A Yeuh catheter was directed into the perihepatic fluid with ultrasound guidance. A paracentesis was performed. Approximately 3 L of blood-tinged fluid was removed during the procedure.  Attention was directed to accessing the portal venous system. The patient has known varices along the anterior abdomen that communicate with the left portal venous system. Prominent varices were identified in the anterior abdominal wall with ultrasound. Using sterile technique, a 21 gauge needle was directed into 1 of these varices with ultrasound guidance. A micropuncture dilator set was placed. Venography was performed. The contrast did not reflux into the portal venous system. A branch more caudal was targeted with ultrasound guidance. A 21 gauge needle was directed into this vein with ultrasound guidance and a micropuncture dilator was placed. Additional venography was performed. Unable to cannulate the left portal venous system with a 0.018 wire. As result, these catheters were left in place and attention was directed to achieve transhepatic portal venous access. Using ultrasound guidance, right portal vein was identified with ultrasound. A  Wilcox needle was directed into the right portal vein with ultrasound guidance. Contrast injection confirmed placement in the portal venous system. A wire was advanced into the main portal vein. A 3 French catheter was placed with a radiopaque wire within it. This catheter was secured to the skin.  The right side of the neck was prepped and draped in sterile fashion. Using ultrasound guidance, a 21 gauge needle was directed into the right internal jugular vein with ultrasound guidance. A micropuncture dilator set was placed and upsized to a 10 [place] sheath. A MPV catheter was used to cannulate a prominent hepatic vein which probably represents the middle hepatic vein. Using the 3 French portal vein catheter as a target, the  TIPS needle was directed towards the central aspect of the right portal vein with fluoroscopic guidance. Contrast injection confirmed placement within the portal venous system. A hydrophilic wire was advanced into the portal venous system. Catheter and Amplatz wire with advanced into the portal venous system. Pigtail catheter was placed for a portal venogram. The tract was dilated with an 5 mm x 40 mm balloon. A 10 mm x 80 mm (60 mm covered) Viatorr stent was selected. The stent was advanced into the main portal vein. The uncovered portion was positioned in the portal venous system. The remainder of the stent was deployed. The stent was post dilated with an 8 mm x 40 mm balloon. Portal vein and systemic pressures were obtained. The 10 French vascular sheath was removed with manual compression. The transhepatic portal vein catheter, paracentesis catheter and anterior abdominal catheters were removed with manual compression. The patient was transferred to the PACU for recovery.  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 We attempted to use these veins as access to the portal venous system. The venography demonstrated that the flow was away from the liver and we were unable to reflux contrast into the portal venous system or cannulate the portal venous system with a wire. Right portal vein access was successfully achieved with a transhepatic approach using ultrasound and fluoroscopic guidance. The TIPS was created between a prominent hepatic vein (probably the middle hepatic vein) and the central aspect of the right portal vein. The TIPS stent is widely patent. The post-procedure portal vein pressure was 21 mmHg. The post TIPS pressure in the hepatic veins and the right atrium was between 14-15 mmHg. Therefore, the pressure gradient after TIPS was between 6-7 mmHg.  IMPRESSION: Successful TIPS  procedure.   Electronically Signed   By: Markus Daft M.D.   On: 12/26/2013 15:21    Micro Results      Recent Results (from the past 240 hour(s))  BODY FLUID CULTURE     Status: None   Collection Time    12/25/13  2:45 PM      Result Value Ref Range Status   Specimen Description ASCITIC   Final   Special Requests Normal   Final   Gram Stain     Final   Value: WBC PRESENT, PREDOMINANTLY MONONUCLEAR     NO ORGANISMS SEEN     Performed at [facility]   Culture     Final   Value: NO GROWTH 3 DAYS     Performed at [facility]   Report Status 12/28/2013 FINAL   Final  URINE CULTURE     Status: None   Collection Time  12/26/13  8:15 AM      Result Value Ref Range Status   Specimen Description URINE, CLEAN CATCH   Final   Special Requests NONE   Final   Culture  Setup Time     Final   Value: 12/26/2013 14:51     Performed at [facility]     Final   Value: NO GROWTH     Performed at [facility]   Culture     Final   Value: NO GROWTH     Performed at [facility]   Report Status 12/27/2013 FINAL   Final  SURGICAL PCR SCREEN     Status: Abnormal   Collection Time    12/26/13  8:20 AM      Result Value Ref Range Status   MRSA, PCR POSITIVE (*) NEGATIVE Final   Staphylococcus aureus POSITIVE (*) NEGATIVE Final   Comment:            The Xpert SA Assay (FDA     approved for NASAL specimens     in patients over 53 years of age),     is one component of     a comprehensive surveillance     program.  Test performance has     been validated by [employer] for patients greater     than or equal to 54 year old.     It is not intended     to diagnose infection nor to     guide or monitor treatment.     History of present illness and  Hospital Course:     Kindly see H&P for history of present illness and admission details, please review complete Labs, Consult reports and Test reports for all details in brief  Oscar Swanson, is a 53 y.o. male, patient with history of  alcoholic and hepatitis C. cirrhosis, portal hypertension, peptic encephalopathy, active smoker, esophageal varices, chronic thrombocytopenia is admitted to the hospital with chief complaints of abdominal distention and shortness of breath secondary to massive ascites. Underwent multiple ultrasound-guided paracentesis by IR followed by TIPS procedure by IR after which his ascites is stable on diuretics and fluid restriction. Abdominal fluid not consistent with SBP.   Patient quite noncompliant in the outpatient setting and remains very high risk for readmission. Patient has been counseled on compliance with medications and fluid salt restriction.      Alcoholic and hepatitis C cirrhosis with portal hypertension and recurrent Ascites   -Post TIPS by IR on 12/26/2013 - ammonia stable but mildly confused. Continue Lasix, spironolactone, Xifaxan and lactulose  - abdomen distension is essentially stable after TIPS on 12/26/2013, Total of 24 liters of ascites removed so far (once on 2/11, second on 2/13, third 2/19, fourth on 2/21) , follow with GI and IR post DC.     Esophageal varices with bleeding  - currently stable, continue Protonix for now  - appreciate GI input, needed EGD with band ligation x 5 in January 2015, TIPS done on 2-26 .  -follow with GI and IR post DC     Acute hypoxic respiratory failure  - secondary to PNA - completed therapy with Levaquin for 7 days, shortness of breath is completely resolved after treatment for pneumonia and fluid removal with paracentesis.    Hyponatremia  - due to cirrhosis and improved with diuresis and paracentesis  - Continue salt and fluid restriction along with diuretics. Repeat CMP  in 7-10 days in the outpatient setting    Severe malnutrition  - secondary to progressive liver cirrhosis and FTT - continue 2 gram sodium diet, nutrition consultations appreciated     Hepatitis  C  - CMET overall stable, Positive viral load, GI following     Anemia of chronic disease  - no signs of active bleeding      Thrombocytopenia  - secondary to alcohol induced bone marrow damage, no signs of active bleeding       Today   Subjective:   Oscar Swanson today has no headache,no chest abdominal pain,no new weakness tingling or numbness, feels much better wants to go home today.    Objective:   Blood pressure 106/66, pulse 90, temperature 98.2 F (36.8 C), temperature source Oral, resp. rate 18, height 5\' 8"  (1.727 m), weight 92.7 kg (204 lb 5.9 oz), SpO2 98.00%.   Intake/Output Summary (Last 24 hours) at 12/30/13 0954 Last data filed at 12/30/13 0610  Gross per 24 hour  Intake      0 ml  Output   1275 ml  Net  -1275 ml    Exam Awake Alert, Oriented *3, No new F.N deficits, Normal affect [place].AT,PERRAL Supple Neck,No JVD, No cervical lymphadenopathy appriciated.  Symmetrical Chest wall movement, Good air movement bilaterally, CTAB RRR,No Gallops,Rubs or new Murmurs, No Parasternal Heave +ve B.Sounds, Abd Soft but distended, Non tender, No organomegaly appriciated, No rebound -guarding or rigidity. No Cyanosis, Clubbing or edema, No new Rash or bruise  Data Review   CBC w Diff: Lab Results  Component Value Date   WBC 4.1 12/30/2013   HGB 9.8* 12/30/2013   HCT 28.8* 12/30/2013   PLT 70* 12/30/2013   LYMPHOPCT 15 12/10/2013   MONOPCT 22* 12/10/2013   EOSPCT 2 12/10/2013   BASOPCT 1 12/10/2013    CMP: Lab Results  Component Value Date   NA 132* 12/30/2013   K 4.2 12/30/2013   CL 100 12/30/2013   CO2 24 12/30/2013   BUN 7 12/30/2013   CREATININE 0.65 12/30/2013   PROT 5.9* 12/30/2013   ALBUMIN 1.8* 12/30/2013   BILITOT 0.9 12/30/2013   ALKPHOS 106 12/30/2013   AST 57* 12/30/2013   ALT 30 12/30/2013  .   Total Time in preparing paper work, data evaluation and todays exam - 35 minutes  Thurnell Lose M.D on 12/30/2013 at 9:54 AM  [facility] Office   [phone]

## 2013-12-30 NOTE — Clinical Social Work Note (Signed)
Per RNCM patient states that he is now agreeable to SNF. Patient states that he was told that he would have a bed at a SNF near the hospital. CSW explained to the patient that [facility] had made an offer for him over a week ago and CSW could see if this offer is still available. Patient now states that he would be agreeable to going to SNF in [place].   CSW contacted admission coordinator Reino Bellis with [facility] to determine if patient could still come to facility. Admission coordinator stated that she does not see how the patient is "skillable" at this point given his ambulation distance of 330 feet, and his modified independent/supervision with ADLs. CSW sent the most recent PT/OT evals, progress note, and medication list to facility. Reino Bellis states that she will show these to her administrator, but it is unlikely they will be able to accept patient. CSW has notified MD and RNCM.  Liz Beach, [place], [place], [phone]

## 2013-12-30 NOTE — Progress Notes (Signed)
Nsg Discharge Note  Admit Date:  12/10/2013 Discharge date: 12/30/2013   Oscar Swanson to be D/C'd Home per MD order.  AVS completed.  Copy for chart, and copy for patient signed, and dated. Patient/caregiver able to verbalize understanding.  Discharge Medication:   Medication List    STOP taking these medications       sulfamethoxazole-trimethoprim 800-160 MG per tablet  Commonly known as:  BACTRIM DS      TAKE these medications       beclomethasone 80 MCG/ACT inhaler  Commonly known as:  QVAR  Inhale 2 puffs into the lungs 2 (two) times daily.     feeding supplement (ENSURE COMPLETE) Liqd  Take 237 mLs by mouth 3 (three) times daily with meals.     folic acid 1 MG tablet  Commonly known as:  FOLVITE  Take 1 tablet (1 mg total) by mouth daily.     furosemide 40 MG tablet  Commonly known as:  LASIX  Take 1 tablet (40 mg total) by mouth 2 (two) times daily.     lactulose 10 GM/15ML solution  Commonly known as:  CHRONULAC  Take 30 mLs (20 g total) by mouth 3 (three) times daily.     nicotine 14 mg/24hr patch  Commonly known as:  NICODERM CQ - dosed in mg/24 hours  Place 1 patch (14 mg total) onto the skin daily.     ondansetron 4 MG tablet  Commonly known as:  ZOFRAN  Take 1 tablet (4 mg total) by mouth every 6 (six) hours as needed for nausea.     oxyCODONE 5 MG immediate release tablet  Commonly known as:  Oxy IR/ROXICODONE  Take 1 tablet (5 mg total) by mouth every 6 (six) hours as needed for severe pain.     pantoprazole 40 MG tablet  Commonly known as:  PROTONIX  Take 40 mg by mouth 2 (two) times daily.     rifaximin 550 MG Tabs tablet  Commonly known as:  XIFAXAN  Take 1 tablet (550 mg total) by mouth 2 (two) times daily.     spironolactone 100 MG tablet  Commonly known as:  ALDACTONE  Take 2 tablets (200 mg total) by mouth daily.     thiamine 100 MG tablet  Commonly known as:  VITAMIN B-1  Take 1 tablet (100 mg total) by mouth daily.         Discharge Assessment: Filed Vitals:   12/30/13 1311  BP: 115/71  Pulse: 90  Temp: 98.5 F (36.9 C)  Resp: 18   Skin clean, dry and intact without evidence of skin break down, no evidence of skin tears noted.   D/c Instructions-Education: Discharge instructions given to patient/family with verbalized understanding. D/c education completed with patient/family including follow up instructions, medication list, d/c activities limitations if indicated, with other d/c instructions as indicated by MD - patient able to verbalize understanding, all questions fully answered. Patient instructed to return to ED, call 911, or call MD for any changes in condition.  Patient escorted via [place], and D/C home via private auto.  Dayle Points, RN 12/30/2013 4:41 PM

## 2013-12-30 NOTE — Progress Notes (Signed)
Daily Rounding Note  12/30/2013, 11:57 AM  LOS: 20 days   SUBJECTIVE:       Weights: 208 to 204 # in last 3 days. Pt without complaints.  At least 3 stools daily.  No pain except in low back.  Eating well.    OBJECTIVE:         Vital signs in last 24 hours:    Temp:  [98 F (36.7 C)-98.3 F (36.8 C)] 98.2 F (36.8 C) (03/02 0610) Pulse Rate:  [76-90] 90 (03/02 0610) Resp:  [18] 18 (03/02 0610) BP: (106-119)/(66-74) 106/66 mmHg (03/02 0610) SpO2:  [98 %-100 %] 98 % (03/02 0610) Weight:  [92.7 kg (204 lb 5.9 oz)] 92.7 kg (204 lb 5.9 oz) (03/02 0610) Last BM Date: 12/29/13 General: looks chronically unwell   Heart: RRR Chest: clear bil.  Abdomen: protuberant, soft, not tender.  BS active.   Extremities: + LE edema, 1 plus (improved) Neuro/Psych:  Pleasant, no asterixis, no confusion, appropriate questions.   Intake/Output from previous day: 03/01 0701 - 03/02 0700 In: -  Out: 1275 [Urine:1275]  Intake/Output this shift:    Lab Results:  Recent Labs  12/30/13 0620  WBC 4.1  HGB 9.8*  HCT 28.8*  PLT 70*   BMET  Recent Labs  12/28/13 0551 12/29/13 0605 12/30/13 0620  NA 132* 136* 132*  K 4.3 4.4 4.2  CL 99 103 100  CO2 25 24 24   GLUCOSE 98 94 95  BUN 7 6 7   CREATININE 0.69 0.66 0.65  CALCIUM 8.0* 8.2* 8.1*   LFT  Recent Labs  12/28/13 0551 12/29/13 0605 12/30/13 0620  PROT 5.9* 5.9* 5.9*  ALBUMIN 1.9* 1.9* 1.8*  AST 44* 48* 57*  ALT 26 26 30   ALKPHOS 103 102 106  BILITOT 1.1 1.0 0.9   PT/INR  Recent Labs  12/29/13 0605 12/30/13 0620  LABPROT 21.1* 20.4*  INR 1.89* 1.80*    . Chlorhexidine Gluconate Cloth  6 each Topical Q0600  . feeding supplement (ENSURE COMPLETE)  237 mL Oral TID WC  . folic acid  1 mg Oral Daily  . furosemide  40 mg Oral Daily  . lactulose  30 g Oral TID  . lidocaine  1 patch Transdermal Q24H  . LORazepam  0.5 mg Oral QHS,MR X 1  . multivitamin with  minerals  1 tablet Oral Daily  . mupirocin ointment  1 application Nasal BID  . nicotine  14 mg Transdermal Daily  . pantoprazole  40 mg Oral BID  . rifaximin  550 mg Oral BID  . sodium chloride  3 mL Intravenous Q12H  . thiamine  100 mg Oral Daily    ASSESMENT:   * Hep C, cirrhosis  * Ascites, s/p multiple large volume paracentesis. Diuretic management hampered by hyponatremia. S/p TIPS and another 3 litre paracentesis 2/26. Remains on Lasix 40 mg/day. Overall weight is down 55 # in 19 days. On ultrasound 2/27: small volume of ascites, no paracentesis performed.  * Coagulopathy. Received 2 units FFP today to allow for TIPS.  * Hyponatremia, improved. On 2 gram sodium diet.  * Pulmonary infiltrates, resolved.  * Thrombocytopenia. Stable.  * Protein malnutrition.  * UGIB January 2015 (hemetemesis, blood per rectum) . S/p EGD 11/08/13: 2 trunks medium sized varices without stigmata treated with band ligation x 5. Old clot in stomach. 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  * Normocytic anemia. Has not required RBCs this admission.  *  Schizophrenia dating to pt's early 6s. AMS during January admission. Ammonia into 70s, now 28, on lactulose and Rifaxamin.      PLAN   *  Discharging home to his sister's today. *  Daily weights, 2 gram NA diet (this will be challenge).  Continue Lasix 40 mg daily. Continue lactulose and Xifaxan for now. Reminded him to avoid ETOH *  GI ROV with Amy Esterwood PA at [facility] 01/10/14 at 10 AM.  PMD is Angelica Chessman, MD  *  Radiologist Dr Anselm Pancoast will arrange follow up in ~ 6 weeks.      Oscar Swanson  12/30/2013, 11:57 AM Pager: [phone]  GI ATTENDING  Agree with H&P as outlined above. Patient had his case reviewed in morning report with multiple GI colleagues. Stable for discharge. Tolerating TIPS well. Continue on diuretic and medication for encephalopathy prophylaxis. He will followup in the office next week.  Docia Chuck. Geri Semin[place]., M.D. [facility] Division of Gastroenterology

## 2013-12-30 NOTE — Discharge Instructions (Signed)
Follow with Primary MD JEGEDE, OLUGBEMIGA, MD in 7 days   Get CBC, CMP, checked 7 days by Primary MD and again as instructed by your Primary MD.     Activity: As tolerated with Full fall precautions use walker/cane & assistance as needed   Disposition Home ( refused SNF)   Diet: Heart Healthy - Check your Weight same time everyday, if you gain over 2 pounds, or you develop in leg swelling, experience more shortness of breath or chest pain, call your Primary MD immediately. Follow Cardiac Low Salt Diet and 1.5 lit/day fluid restriction.   On your next visit with her primary care physician please Get Medicines reviewed and adjusted.  Please request your Prim.MD to go over all Hospital Tests and Procedure/Radiological results at the follow up, please get all Hospital records sent to your Prim MD by signing hospital release before you go home.   If you experience worsening of your admission symptoms, develop shortness of breath, life threatening emergency, suicidal or homicidal thoughts you must seek medical attention immediately by calling 911 or calling your MD immediately  if symptoms less severe.  You Must read complete instructions/literature along with all the possible adverse reactions/side effects for all the Medicines you take and that have been prescribed to you. Take any new Medicines after you have completely understood and accpet all the possible adverse reactions/side effects.   Do not drive and provide baby sitting services if your were admitted for syncope or siezures until you have seen by Primary MD or a Neurologist and advised to do so again.  Do not drive when taking Pain medications.    Do not take more than prescribed Pain, Sleep and Anxiety Medications  Special Instructions: If you have smoked or chewed Tobacco  in the last 2 yrs please stop smoking, stop any regular Alcohol  and or any Recreational drug use.  Wear Seat belts while driving.   Please note  You  were cared for by a hospitalist during your hospital stay. If you have any questions about your discharge medications or the care you received while you were in the hospital after you are discharged, you can call the unit and asked to speak with the hospitalist on call if the hospitalist that took care of you is not available. Once you are discharged, your primary care physician will handle any further medical issues. Please note that NO REFILLS for any discharge medications will be authorized once you are discharged, as it is imperative that you return to your primary care physician (or establish a relationship with a primary care physician if you do not have one) for your aftercare needs so that they can reassess your need for medications and monitor your lab values.

## 2013-12-31 ENCOUNTER — Encounter (HOSPITAL_COMMUNITY): Payer: Self-pay | Admitting: Diagnostic Radiology

## 2014-01-06 ENCOUNTER — Other Ambulatory Visit (HOSPITAL_COMMUNITY): Payer: Self-pay | Admitting: Diagnostic Radiology

## 2014-01-06 DIAGNOSIS — K7030 Alcoholic cirrhosis of liver without ascites: Secondary | ICD-10-CM

## 2014-01-06 DIAGNOSIS — B1920 Unspecified viral hepatitis C without hepatic coma: Secondary | ICD-10-CM

## 2014-01-06 DIAGNOSIS — Z95828 Presence of other vascular implants and grafts: Secondary | ICD-10-CM

## 2014-01-06 DIAGNOSIS — K7290 Hepatic failure, unspecified without coma: Secondary | ICD-10-CM

## 2014-01-06 DIAGNOSIS — R188 Other ascites: Secondary | ICD-10-CM

## 2014-01-08 ENCOUNTER — Telehealth: Payer: Self-pay | Admitting: Internal Medicine

## 2014-01-08 NOTE — Telephone Encounter (Signed)
[facility] calling because they have not received response regarding the following  The Home health certification and plan of care form (signed and filled out by physician) 3 interim orders: 1) skilled nursing 2) social work 3) physical therapy.

## 2014-01-10 ENCOUNTER — Ambulatory Visit (INDEPENDENT_AMBULATORY_CARE_PROVIDER_SITE_OTHER): Payer: Medicaid Other | Admitting: Physician Assistant

## 2014-01-10 ENCOUNTER — Other Ambulatory Visit (INDEPENDENT_AMBULATORY_CARE_PROVIDER_SITE_OTHER): Payer: Medicaid Other

## 2014-01-10 ENCOUNTER — Telehealth: Payer: Self-pay | Admitting: Physician Assistant

## 2014-01-10 ENCOUNTER — Encounter: Payer: Self-pay | Admitting: Physician Assistant

## 2014-01-10 VITALS — BP 120/72 | HR 78 | Ht 68.0 in | Wt 217.0 lb

## 2014-01-10 DIAGNOSIS — K7460 Unspecified cirrhosis of liver: Secondary | ICD-10-CM

## 2014-01-10 DIAGNOSIS — R601 Generalized edema: Secondary | ICD-10-CM

## 2014-01-10 DIAGNOSIS — K709 Alcoholic liver disease, unspecified: Secondary | ICD-10-CM

## 2014-01-10 DIAGNOSIS — R609 Edema, unspecified: Secondary | ICD-10-CM

## 2014-01-10 LAB — COMPREHENSIVE METABOLIC PANEL
ALT: 49 U/L (ref 0–53)
AST: 108 U/L — AB (ref 0–37)
Albumin: 2.4 g/dL — ABNORMAL LOW (ref 3.5–5.2)
Alkaline Phosphatase: 106 U/L (ref 39–117)
BUN: 9 mg/dL (ref 6–23)
CALCIUM: 8.7 mg/dL (ref 8.4–10.5)
CHLORIDE: 111 meq/L (ref 96–112)
CO2: 23 mEq/L (ref 19–32)
CREATININE: 0.7 mg/dL (ref 0.4–1.5)
GFR: 125.77 mL/min (ref >60.00)
Glucose, Bld: 104 mg/dL — ABNORMAL HIGH (ref 70–99)
Potassium: 4.1 mEq/L (ref 3.5–5.1)
Sodium: 138 mEq/L (ref 135–145)
Total Bilirubin: 1.5 mg/dL — ABNORMAL HIGH (ref 0.3–1.2)
Total Protein: 6.7 g/dL (ref 6.0–8.3)

## 2014-01-10 LAB — CBC WITH DIFFERENTIAL/PLATELET
BASOS ABS: 0 10*3/uL (ref 0.0–0.1)
Basophils Relative: 0.9 % (ref 0.0–3.0)
EOS ABS: 0.1 10*3/uL (ref 0.0–0.7)
Eosinophils Relative: 3 % (ref 0.0–5.0)
HCT: 30.2 % — ABNORMAL LOW (ref 39.0–52.0)
Hemoglobin: 9.7 g/dL — ABNORMAL LOW (ref 13.0–17.0)
LYMPHS PCT: 29.2 % (ref 12.0–46.0)
Lymphs Abs: 1.2 10*3/uL (ref 0.7–4.0)
MCHC: 32.1 g/dL (ref 30.0–36.0)
MCV: 90.1 fl (ref 78.0–100.0)
Monocytes Absolute: 0.7 10*3/uL (ref 0.1–1.0)
Monocytes Relative: 15.9 % — ABNORMAL HIGH (ref 3.0–12.0)
NEUTROS ABS: 2.1 10*3/uL (ref 1.4–7.7)
NEUTROS PCT: 51 % (ref 43.0–77.0)
PLATELETS: 106 10*3/uL — AB (ref 150.0–400.0)
RBC: 3.35 Mil/uL — ABNORMAL LOW (ref 4.22–5.81)
RDW: 22.6 % — AB (ref 11.5–14.6)
WBC: 4.2 10*3/uL — ABNORMAL LOW (ref 4.5–10.5)

## 2014-01-10 LAB — PROTIME-INR
INR: 1.9 ratio — AB (ref 0.8–1.0)
Prothrombin Time: 19.9 s — ABNORMAL HIGH (ref 10.2–12.4)

## 2014-01-10 LAB — AMMONIA: Ammonia: 26 umol/L (ref 11–35)

## 2014-01-10 MED ORDER — LACTULOSE 10 GM/15ML PO SOLN: ORAL | Status: DC

## 2014-01-10 MED ORDER — PANTOPRAZOLE SODIUM 40 MG PO TBEC: 40.0000 mg | DELAYED_RELEASE_TABLET | Freq: Every day | ORAL | Status: DC

## 2014-01-10 MED ORDER — SPIRONOLACTONE 50 MG PO TABS: ORAL_TABLET | ORAL | Status: DC

## 2014-01-10 MED ORDER — ENSURE PO LIQD: 1.0000 | Freq: Two times a day (BID) | ORAL | Status: DC

## 2014-01-10 NOTE — Patient Instructions (Signed)
Please go to the basement level to have your labs drawn.   We sent prescriptions to [facility] for Protonix, Aldactone, Chronulac. We printed the prescription for Ensure.   Continue Lasix 40 mg. Once daily.  Follow up with Amy Esterwood PA-C on 01-27-2014 at 10:00 am .

## 2014-01-10 NOTE — Progress Notes (Signed)
Subjective:    Patient ID: Oscar Swanson, male    DOB: Jul 18, 1961, 53 y.o.   MRN: [id number]  HPI  Oscar Swanson is a 52 year old white male known to Dr. Ardis Swanson with history of decompensated cirrhosis secondary to EtOH and hepatitis C. Patient comes in today for a post hospital followup after a prolonged admission February 10 through 12/30/2013. He had acute congestive heart failure, pneumonia, and anasarca secondary to decompensated cirrhosis. He also has history of schizophrenia and drug abuse.  Due to refractory anasarca patient underwent a TIPS procedure on 12/25/2013 and tolerated procedure well. By our scales today he is down about 50 pounds since admission to the hospital. He says he feels much better and that his breathing is much easier. He still has a significant amount of edema in his lower remedies and into his groin. His sister is his primary caregiver and states that he's been taking his medications, he has not been drinking any alcohol, has been no evidence of any bleeding and he has been eating well. She feels that he is intermittently confused. He was discharged on Lasix 40 mg by mouth daily, no Aldactone. He has been taking Xifaxan but not Chronulac.    Review of Systems  Constitutional: Negative.   HENT: Negative.   Eyes: Negative.   Respiratory: Negative.   Cardiovascular: Negative.   Gastrointestinal: Positive for abdominal distention.  Endocrine: Negative.   Genitourinary: Negative.   Musculoskeletal: Negative.   Allergic/Immunologic: Negative.   Neurological: Negative.   Hematological: Negative.   Psychiatric/Behavioral: Positive for confusion.   Outpatient Prescriptions Prior to Visit  Medication Sig Dispense Refill  . beclomethasone (QVAR) 80 MCG/ACT inhaler Inhale 2 puffs into the lungs 2 (two) times daily.       . furosemide (LASIX) 40 MG tablet Take 1 tablet (40 mg total) by mouth 2 (two) times daily.  30 tablet  0  . ondansetron (ZOFRAN) 4 MG tablet Take 1  tablet (4 mg total) by mouth every 6 (six) hours as needed for nausea.  30 tablet  0  . rifaximin (XIFAXAN) 550 MG TABS tablet Take 1 tablet (550 mg total) by mouth 2 (two) times daily.  60 tablet  1  . folic acid (FOLVITE) 1 MG tablet Take 1 tablet (1 mg total) by mouth daily.  90 tablet  3  . feeding supplement, ENSURE COMPLETE, (ENSURE COMPLETE) LIQD Take 237 mLs by mouth 3 (three) times daily with meals.  30 Bottle  1  . lactulose (CHRONULAC) 10 GM/15ML solution Take 30 mLs (20 g total) by mouth 3 (three) times daily.  960 mL  3  . nicotine (NICODERM CQ - DOSED IN MG/24 HOURS) 14 mg/24hr patch Place 1 patch (14 mg total) onto the skin daily.  28 patch  0  . oxyCODONE (OXY IR/ROXICODONE) 5 MG immediate release tablet Take 1 tablet (5 mg total) by mouth every 6 (six) hours as needed for severe pain.  30 tablet  0  . pantoprazole (PROTONIX) 40 MG tablet Take 40 mg by mouth 2 (two) times daily.      Marland Kitchen spironolactone (ALDACTONE) 100 MG tablet Take 2 tablets (200 mg total) by mouth daily.  60 tablet  11  . thiamine (VITAMIN B-1) 100 MG tablet Take 1 tablet (100 mg total) by mouth daily.  90 tablet  3   No facility-administered medications prior to visit.   Allergies  Allergen Reactions  . Haldol [Haloperidol]     afib problems  Patient Active Problem List   Diagnosis Date Noted  . Hyponatremia 12/17/2013  . CHF exacerbation 12/10/2013  . Acute CHF 12/10/2013  . HCAP (healthcare-associated pneumonia) 12/10/2013  . Ascites 12/10/2013  . Hepatic failure secondary to chronic hepatic disease 12/02/2013  . Hepatitis C 12/02/2013  . Cellulitis 12/02/2013  . 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 Mild Coagulopathy 11/08/2013  . Cocaine abuse 11/08/2013  . Acute respiratory failure 11/08/2013  . Esophageal varices with bleeding 11/08/2013  . Hepatic encephalopathy 11/07/2013  . Schizophrenia 11/07/2013  . ? Diabetes 09/23/2013  . Smoking 09/23/2013  . Back pain 09/23/2013   History    Substance Use Topics  . Smoking status: Current Some Day Smoker -- 0.50 packs/day for 25 years    Types: Cigarettes  . Smokeless tobacco: Never Used     Comment: Tobacco information given 12/10/12  . Alcohol Use: Yes     Comment:  drink beer socially    family history includes Colon polyps in his sister; Hypertension in his mother; Lung cancer in his mother. There is no history of Colon cancer.      Objective:   Physical Exam well-developed chronically ill-appearing somewhat disheveled white male in no acute distress, accompanied by his sister. Ration is pleasant pressure 120/72 pulse 78 height 5 foot 8 weight 217 this is down from 267 on admission to the hospital. HEENT;nontraumatic normocephalic EOMI PERRLA sclera anicteric, Supple ;no JVD, Cardiovascular; regular rate and rhythm with S1-S2 no murmur or gallop, Pulmonary; clear bilaterally, Abdomen ;large nontender ascites he does have 2+ pitting edema in the flanks bilaterally no palpable hepatosplenomegaly no tenderness, Rectal; exam not done, Extremities; 2+ pitting edema in to the thighs, Psych; mood and affect appropriate he is not confused currently and no asterixis        Assessment & Plan:  #1  52-year-old male with decompensated cirrhosis secondary to alcohol and hepatitis C complicated by refractory anasarca now status post TIPS 12/25/2013. Patient has had diuresis of about 50 pounds. He still has a significant amount of edema in his lower extremities #2 history of an hepatic encephalopathy-no overt confusion today no asterixis though his sister reports intermittent confusion #3 history of esophageal varices #4 recent acute CHF and pneumonia #5 history of schizophrenia  Plan; check CBC with differential protime and see met today and venous ammonia Continue Lasix 40 mg by mouth every morning Add Aldactone 50 mg by mouth every morning Refill Protonix 40 mg by mouth daily Continue Xifaxan 550 twice daily Add Chronulac 30 cc  twice daily Strict avoidance of all alcohol and Ilegal  Substances Office followup with Dr. Jacobs or myself in 2 weeks   

## 2014-01-11 NOTE — Progress Notes (Signed)
i agree with the note above 

## 2014-01-14 NOTE — Telephone Encounter (Signed)
Phone done today 01-14-2014.

## 2014-01-14 NOTE — Telephone Encounter (Signed)
I called Medicaid and tried to do a prior authorization on the Ensure prescription we sent that he requested.  I was told by a representative that no food supplements of any kind are covered by Medicaid including the Ensure.  I left my name and number. ( I called them at [phone].Option 4.  )

## 2014-01-15 ENCOUNTER — Telehealth: Payer: Self-pay | Admitting: Physician Assistant

## 2014-01-15 ENCOUNTER — Ambulatory Visit: Payer: Medicaid Other

## 2014-01-15 NOTE — Telephone Encounter (Signed)
Spoke with patient's sister and she will have patient call back when he returns to get results

## 2014-01-18 ENCOUNTER — Ambulatory Visit: Payer: Medicaid Other | Attending: Internal Medicine | Admitting: Internal Medicine

## 2014-01-18 VITALS — BP 106/66 | HR 73 | Temp 98.4°F | Wt 210.0 lb

## 2014-01-18 DIAGNOSIS — I509 Heart failure, unspecified: Secondary | ICD-10-CM

## 2014-01-18 DIAGNOSIS — K7460 Unspecified cirrhosis of liver: Secondary | ICD-10-CM

## 2014-01-18 DIAGNOSIS — E43 Unspecified severe protein-calorie malnutrition: Secondary | ICD-10-CM

## 2014-01-18 MED ORDER — ENSURE PO LIQD: 1.0000 | Freq: Two times a day (BID) | ORAL | Status: DC

## 2014-01-18 NOTE — Progress Notes (Unsigned)
Patient ID: Oscar Swanson, male   DOB: July 15, 1961, 53 y.o.   MRN: [id number]   HPI: Oscar Swanson is a 53 y.o. male presenting on 01/18/2014 here for a hospital f/u after having a TIPS procedure. He also is following closly with Dr Ardis Hughs and it is noted that he started him on Aldactone about 1 wk ago. He is due to go back in 1 wk. Overall, he is doing well and not as swollen as in the past.  He was prescribed Oxycontin by Dr Doreene Burke (he states) but states it causes a rash and therefore, would like to go back on Percocets which apparently Dr Doreene Burke also prescribed. These narcotics are for the pain he gets when "the swelling gets bad".      Past Medical History  Diagnosis Date      . Schizophrenia     onset age 65.  in and out of psych facilities from age 41 to age 64.  s/p electroconvulsive therapy.  . TB (tuberculosis)     sister says this dx'd at age 55 and treated  . Hepatic encephalopathy   . Esophageal varices   . GI bleed   . Cirrhosis   . Thrombocytopenia   . Alcohol abuse   . Mild Coagulopathy 11/08/2013  . Hepatitis C 12/02/2013  . Smoking 09/23/2013    Past Surgical History  Procedure Laterality Date  . Cataract surgery  Bilateral   . Esophagogastroduodenoscopy N/A 11/08/2013    Procedure: ESOPHAGOGASTRODUODENOSCOPY (EGD);  Surgeon: Milus Banister, MD;  Location: [facility];  Service: Endoscopy;  Laterality: N/A;  bedside  . Radiology with anesthesia N/A 12/26/2013    Procedure: Transjugular Intrahepatic Portosystemic Shunt (TIPS);  Surgeon: Carylon Perches, MD;  Location: [facility];  Service: Radiology;  Laterality: N/A;    Current Outpatient Prescriptions  Medication Sig Dispense Refill  . beclomethasone (QVAR) 80 MCG/ACT inhaler Inhale 2 puffs into the lungs 2 (two) times daily.       Marland Kitchen ENSURE (ENSURE) Take 1 Can by mouth 2 (two) times daily between meals.  086 mL  12  . folic acid (FOLVITE) 1 MG tablet Take 1 tablet (1 mg total) by mouth daily.  90 tablet  3  .  furosemide (LASIX) 40 MG tablet Take 1 tablet (40 mg total) by mouth 2 (two) times daily.  30 tablet  0  . lactulose (CHRONULAC) 10 GM/15ML solution Take 30 ML in the breakfast and 30 ml at lunch.  500 mL  0  . ondansetron (ZOFRAN) 4 MG tablet Take 1 tablet (4 mg total) by mouth every 6 (six) hours as needed for nausea.  30 tablet  0  . pantoprazole (PROTONIX) 40 MG tablet Take 1 tablet (40 mg total) by mouth daily.  30 tablet  6  . rifaximin (XIFAXAN) 550 MG TABS tablet Take 1 tablet (550 mg total) by mouth 2 (two) times daily.  60 tablet  1  . spironolactone (ALDACTONE) 50 MG tablet Take 1 tab daily in the AM.  30 tablet  1  . thiamine (VITAMIN B-1) 100 MG tablet Take 100 mg by mouth daily.       No current facility-administered medications for this visit.    Allergies  Allergen Reactions  . Haldol [Haloperidol]     afib problems  . Oxycodone Rash    Family History  Problem Relation Age of Onset  . Hypertension Mother   . Colon cancer Neg Hx   . Lung cancer Mother  smoker  . Colon polyps Sister     twin sister    History   Social History  . Marital Status: Single    Spouse Name: N/A    Number of Children: 0  . Years of Education: N/A   Occupational History  . Not on file.   Social History Main Topics  . Smoking status: Current Some Day Smoker -- 0.50 packs/day for 25 years    Types: Cigarettes  . Smokeless tobacco: Never Used     Comment: Tobacco information given 12/10/12  . Alcohol Use: Yes     Comment:  drink beer socially   . Drug Use: Yes    Special: Cocaine     Comment: not currently  . Sexual Activity: Not on file   Other Topics Concern  . Not on file   Social History Narrative  . No narrative on file    Review of Systems  Review of Systems  Constitutional: Negative for fever, chills, diaphoresis, activity change, appetite change and fatigue.  HENT: Negative for ear pain, nosebleeds, congestion, facial swelling, rhinorrhea, neck pain, neck  stiffness and ear discharge.  Eyes: Negative for pain, discharge, redness, itching and visual disturbance.  Respiratory: Negative for cough, choking, chest tightness, shortness of breath, wheezing and stridor.  Cardiovascular: Negative for chest pain, palpitations and leg swelling.  Gastrointestinal: Negative for abdominal distention, vomiting, diarrhea or consitpation Genitourinary: Negative for dysuria, urgency, frequency, hematuria, flank pain, decreased urine volume, difficulty urinating and dyspareunia.  Musculoskeletal: Negative for back pain, joint swelling, arthralgias or gait problem.  Neurological: Negative for dizziness, tremors, seizures, syncope, facial asymmetry, speech difficulty, weakness, light-headedness, numbness and headaches.  Hematological: Negative for adenopathy. Does not bruise/bleed easily.  Psychiatric/Behavioral: Negative for hallucinations, behavioral problems, confusion, dysphoric mood   Objective:  BP 106/66  Pulse 73  Temp(Src) 98.4 F (36.9 C) (Oral)  Wt 210 lb (95.255 kg)  SpO2 100% Filed Weights   01/18/14 1005  Weight: 210 lb (95.255 kg)     Physical Exam  Constitutional: Appears well-developed and well-nourished. No distress. HENT: Normocephalic. External right and left ear normal. Oropharynx is clear and moist.  Eyes: Conjunctivae and EOM are normal. PERRLA, no scleral icterus.  Neck: Normal ROM. Neck supple. No JVD. No tracheal deviation. No thyromegaly.  CVS: RRR, S1/S2 +, no murmurs, no gallops, no carotid bruit.  Pulmonary: Effort and breath sounds normal, no stridor, rhonchi, wheezes, rales.  Abdominal: Soft. BS +, No tenderness, rebound or guarding. ++distension Musculoskeletal: Normal range of motion. ++ moderate edema and no tenderness.  Neuro: Alert. Normal reflexes, muscle tone coordination. No cranial nerve deficit. Skin: Skin is warm and dry. No rash noted. Not diaphoretic. No erythema. No pallor.  Psychiatric: Normal mood and  affect. Behavior, judgment, thought content normal.   Lab Results  Component Value Date   WBC 4.2* 01/10/2014   HGB 9.7* 01/10/2014   HCT 30.2* 01/10/2014   MCV 90.1 01/10/2014   PLT 106.0* 01/10/2014   Lab Results  Component Value Date   CREATININE 0.7 01/10/2014   BUN 9 01/10/2014   NA 138 01/10/2014   K 4.1 01/10/2014   CL 111 01/10/2014   CO2 23 01/10/2014    Lab Results  Component Value Date   HGBA1C 4.5 09/23/2013   Lipid Panel  No results found for this basename: chol,  trig,  hdl,  cholhdl,  vldl,  ldlcalc        Patient Active Problem List   Diagnosis Date Noted  .  Hyponatremia 12/17/2013  . CHF exacerbation 12/10/2013  . Acute CHF 12/10/2013  . HCAP (healthcare-associated pneumonia) 12/10/2013  . Ascites 12/10/2013  . Hepatic failure secondary to chronic hepatic disease 12/02/2013  . Hepatitis C 12/02/2013  . Cellulitis 12/02/2013  . 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 Mild Coagulopathy 11/08/2013  . Cocaine abuse 11/08/2013  . Acute respiratory failure 11/08/2013  . Esophageal varices with bleeding 11/08/2013  . Hepatic encephalopathy 11/07/2013  . Schizophrenia 11/07/2013  . ? Diabetes 09/23/2013  . Smoking 09/23/2013  . Back pain 09/23/2013     Preventative Medicine:  Health Maintenance  Topic Date Due  . Tetanus/tdap  10/26/1980  . Colonoscopy  10/27/2011  . Influenza Vaccine  05/31/2014    Adult vaccines due  Topic Date Due  . Tetanus/tdap  10/26/1980      Assessment and plan: Cirrhosis - cont current meds as per GI- he is tolerating them  Pedal edema - TED stockings ordered  Pain issues - no record of Dr Doreene Burke prescribing any narcotics for him  - has been told that I cannot prescribe the Percocet for him esp as his ascites and edema now seem controlled  Smoker - tells me he is cutting back.   Protein cal malnutrition- severe - have re-ordered Ensure   Return in about 3 months (around 04/20/2014).   The patient was given  clear instructions to go to ER or return to medical center if symptoms don't improve, worsen or new problems develop. The patient verbalized understanding. The patient was told to call to get lab results if they haven't heard anything in the next week.     Debbe Odea, MD

## 2014-01-27 ENCOUNTER — Encounter: Payer: Self-pay | Admitting: Physician Assistant

## 2014-01-27 ENCOUNTER — Other Ambulatory Visit (INDEPENDENT_AMBULATORY_CARE_PROVIDER_SITE_OTHER): Payer: Medicaid Other

## 2014-01-27 ENCOUNTER — Ambulatory Visit (INDEPENDENT_AMBULATORY_CARE_PROVIDER_SITE_OTHER): Payer: Medicaid Other | Admitting: Physician Assistant

## 2014-01-27 VITALS — BP 118/66 | HR 70 | Ht 68.5 in | Wt 193.0 lb

## 2014-01-27 DIAGNOSIS — K7290 Hepatic failure, unspecified without coma: Secondary | ICD-10-CM

## 2014-01-27 DIAGNOSIS — K7460 Unspecified cirrhosis of liver: Secondary | ICD-10-CM

## 2014-01-27 DIAGNOSIS — R609 Edema, unspecified: Secondary | ICD-10-CM

## 2014-01-27 DIAGNOSIS — K7682 Hepatic encephalopathy: Secondary | ICD-10-CM

## 2014-01-27 DIAGNOSIS — R601 Generalized edema: Secondary | ICD-10-CM

## 2014-01-27 DIAGNOSIS — K769 Liver disease, unspecified: Secondary | ICD-10-CM

## 2014-01-27 LAB — BASIC METABOLIC PANEL
BUN: 11 mg/dL (ref 6–23)
CHLORIDE: 106 meq/L (ref 96–112)
CO2: 21 meq/L (ref 19–32)
CREATININE: 0.7 mg/dL (ref 0.4–1.5)
Calcium: 8.5 mg/dL (ref 8.4–10.5)
GFR: 125.74 mL/min (ref >60.00)
Glucose, Bld: 107 mg/dL — ABNORMAL HIGH (ref 70–99)
Potassium: 4 mEq/L (ref 3.5–5.1)
Sodium: 133 mEq/L — ABNORMAL LOW (ref 135–145)

## 2014-01-27 LAB — PROTIME-INR
INR: 1.8 ratio — ABNORMAL HIGH (ref 0.8–1.0)
PROTHROMBIN TIME: 18.5 s — AB (ref 10.2–12.4)

## 2014-01-27 MED ORDER — TRAMADOL HCL 50 MG PO TABS: 50.0000 mg | ORAL_TABLET | Freq: Three times a day (TID) | ORAL | Status: DC

## 2014-01-27 MED ORDER — TRAMADOL HCL 50 MG PO TABS: 50.0000 mg | ORAL_TABLET | Freq: Four times a day (QID) | ORAL | Status: DC | PRN

## 2014-01-27 MED ORDER — ONDANSETRON HCL 4 MG PO TABS: 4.0000 mg | ORAL_TABLET | Freq: Four times a day (QID) | ORAL | Status: DC | PRN

## 2014-01-27 NOTE — Patient Instructions (Addendum)
Please go to the basement level to have your labs drawn.  Continue the Aldactone 50 mg once daily. Decrease the Lasix to 40 mg once daily. Decrease the Chronulac 30 cc every morning.  We faxed a prescription to your pharmacy, [place], [place].

## 2014-01-27 NOTE — Progress Notes (Signed)
He is doing very well after the tips.  Agree with the above plan.

## 2014-01-27 NOTE — Progress Notes (Signed)
Subjective:    Patient ID: Oscar Swanson, male    DOB: 1961-09-17, 53 y.o.   MRN: [id number]  HPI  Oscar Swanson is a 53 year old white male known to Dr. 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 He also has history of schizophrenia. He currently lives with his sister who is his primary caregiver. He was hospitalized for about 3 weeks in February of 2015 with acute congestive heart failure pneumonia and anasarca secondary to decompensated cirrhosis. He underwent a TIPS procedure on 12/25/2013 and tolerated this well. He was seen back in the office on 01/10/2014 and at that point was down about 50 pounds from his admission weight. He was feeling much better in breathing much easier. At that time he still had a significant amount of edema in his lower extremities and and his groin. He has been abstinent from alcohol. He had been placed on Xifaxan had not been taking any Chronulac recently and his sister felt that he did have some intermittent confusion.  He had been discharged from the hospital on 40 mg of Lasix twice daily. At the last office visit we added Aldactone 50 mg by mouth daily and also placed him back on Chronulac 30 cc twice daily.  His weight was 217 on 01/10/2014. He returns today with a weight of 193. He is pleased with his progress. He does admit that he's had a little bit of dizziness with standing over the past week and has had some intermittent nausea as well. He's been taking his Chronulac and Xifaxan in his sister reports she's not had any confusion or lethargy. He does ask about pain medicine today and says he's had some ongoing problems with back pain. He is scheduled to be seen in followup by interventional radiology tomorrow.    Review of Systems  Constitutional: Positive for fatigue.  HENT: Negative.   Eyes: Negative.   Cardiovascular: Negative.   Gastrointestinal: Positive for abdominal pain and abdominal distention.  Endocrine:  Negative.   Musculoskeletal: Positive for back pain.  Allergic/Immunologic: Negative.   Neurological: Negative.   Hematological: Negative.   Psychiatric/Behavioral: Negative.    Outpatient Prescriptions Prior to Visit  Medication Sig Dispense Refill  . beclomethasone (QVAR) 80 MCG/ACT inhaler Inhale 2 puffs into the lungs 2 (two) times daily.       Marland Kitchen ENSURE (ENSURE) Take 1 Can by mouth 2 (two) times daily between meals.  245 mL  12  . folic acid (FOLVITE) 1 MG tablet Take 1 tablet (1 mg total) by mouth daily.  90 tablet  3  . furosemide (LASIX) 40 MG tablet Take 1 tablet (40 mg total) by mouth 2 (two) times daily.  30 tablet  0  . lactulose (CHRONULAC) 10 GM/15ML solution Take 30 ML in the breakfast and 30 ml at lunch.  500 mL  0  . pantoprazole (PROTONIX) 40 MG tablet Take 1 tablet (40 mg total) by mouth daily.  30 tablet  6  . rifaximin (XIFAXAN) 550 MG TABS tablet Take 1 tablet (550 mg total) by mouth 2 (two) times daily.  60 tablet  1  . spironolactone (ALDACTONE) 50 MG tablet Take 1 tab daily in the AM.  30 tablet  1  . thiamine (VITAMIN B-1) 100 MG tablet Take 100 mg by mouth daily.      . ondansetron (ZOFRAN) 4 MG tablet Take 1 tablet (4 mg total) by mouth every 6 (six) hours as needed for nausea.  30 tablet  0   No facility-administered medications prior to visit.   Allergies  Allergen Reactions  . Haldol [Haloperidol]     afib problems  . Oxycodone Rash   Patient Active Problem List   Diagnosis Date Noted  . Hyponatremia 12/17/2013  . CHF exacerbation 12/10/2013  . Acute CHF 12/10/2013  . HCAP (healthcare-associated pneumonia) 12/10/2013  . Ascites 12/10/2013  . Hepatic failure secondary to chronic hepatic disease 12/02/2013  . Hepatitis C 12/02/2013  . Cellulitis 12/02/2013  . 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 Mild Coagulopathy 11/08/2013  . Cocaine abuse 11/08/2013  . Acute respiratory failure 11/08/2013  . Esophageal varices with bleeding 11/08/2013  .  Hepatic encephalopathy 11/07/2013  . Schizophrenia 11/07/2013  . ? Diabetes 09/23/2013  . Smoking 09/23/2013  . Back pain 09/23/2013   History  Substance Use Topics  . Smoking status: Current Some Day Smoker -- 0.50 packs/day for 25 years    Types: Cigarettes  . Smokeless tobacco: Never Used     Comment: Tobacco information given 12/10/12  . Alcohol Use: Yes     Comment:  drink beer socially    family history includes Colon polyps in his sister; Hypertension in his mother; Lung cancer in his mother. There is no history of Colon cancer.     Objective:   Physical Exam well-developed white male in no acute distress, accompanied by his sister blood pressure 118/66 pulse 70 height 5 foot 8 weight 193. This is down from 217 at last office visit HEENT; nontraumatic normocephalic EOMI PERRLA sclera anicteric, Supple; no JVD, Cardiovascular; regular rate and rhythm with S1-S2 no murmur or gallop, Pulmonary; clear bilaterally, Abdomen ;much less distended no appreciable fluid wave bowel sounds are present no palpable mass or hepatosplenomegaly, Rectal; exam not done, Extremities; 1+ edema bilateral lower ext to the shins, Psych ;mood and affect appropriate no asterixis- mentating well        Assessment & Plan:  #1  53 year old male with decompensated cirrhosis secondary to EtOH and hepatitis C- now about 1 month out from TIPS procedure and has had a significant diuresis with resolution of his anasarca. His weight is down about 70 pounds. #2 history of hepatic encephalopathy- stable, asymptomatic currently\ #3 chronic back pain #4 history of schizophrenia and substance abuse  Plan; CBC with differential, Bmet, PT today Continue Aldactone 50 mg by mouth once daily Decrease Lasix to 40 mg by mouth every morning Decrease Chronulac 30 cc by mouth once daily and continue Xifaxan 550 twice daily Add Ultram 50 mg twice daily as needed for pain, I told the patient we would not prescribe anything  stronger. Office followup with myself or Dr. Ardis Hughs in 2 weeks as we may need to completely taper off of diuretics

## 2014-01-28 ENCOUNTER — Other Ambulatory Visit: Payer: Self-pay | Admitting: *Deleted

## 2014-01-28 ENCOUNTER — Ambulatory Visit
Admission: RE | Admit: 2014-01-28 | Discharge: 2014-01-28 | Disposition: A | Discharge status: Home / Self Care | Payer: Medicaid Other | Source: Ambulatory Visit | Attending: Radiology | Admitting: Radiology

## 2014-01-28 ENCOUNTER — Ambulatory Visit
Admission: RE | Admit: 2014-01-28 | Discharge: 2014-01-28 | Disposition: A | Discharge status: Home / Self Care | Payer: Medicaid Other | Source: Ambulatory Visit | Attending: Diagnostic Radiology | Admitting: Diagnostic Radiology

## 2014-01-28 DIAGNOSIS — K7030 Alcoholic cirrhosis of liver without ascites: Secondary | ICD-10-CM

## 2014-01-28 DIAGNOSIS — R188 Other ascites: Secondary | ICD-10-CM

## 2014-01-28 DIAGNOSIS — K7290 Hepatic failure, unspecified without coma: Secondary | ICD-10-CM

## 2014-01-28 DIAGNOSIS — B1920 Unspecified viral hepatitis C without hepatic coma: Secondary | ICD-10-CM

## 2014-01-28 DIAGNOSIS — Z95828 Presence of other vascular implants and grafts: Secondary | ICD-10-CM

## 2014-01-28 DIAGNOSIS — K7460 Unspecified cirrhosis of liver: Secondary | ICD-10-CM

## 2014-01-28 LAB — CBC WITH DIFFERENTIAL/PLATELET
HEMATOCRIT: 34 % — AB (ref 39.0–52.0)
HEMOGLOBIN: 11.1 g/dL — AB (ref 13.0–17.0)
MCHC: 32.6 g/dL (ref 30.0–36.0)
MCV: 86.7 fl (ref 78.0–100.0)
Platelets: 93 10*3/uL — ABNORMAL LOW (ref 150.0–400.0)
RBC: 3.92 Mil/uL — ABNORMAL LOW (ref 4.22–5.81)
RDW: 26.8 % — AB (ref 11.5–14.6)
WBC: 3.9 10*3/uL — ABNORMAL LOW (ref 4.5–10.5)

## 2014-01-30 ENCOUNTER — Telehealth: Payer: Self-pay | Admitting: *Deleted

## 2014-01-30 ENCOUNTER — Telehealth: Payer: Self-pay | Admitting: Physician Assistant

## 2014-01-30 NOTE — Telephone Encounter (Signed)
Tonette Bihari, CMA at 01/14/2014 1:14 PM     Status: Signed           I called Medicaid and tried to do a prior authorization on the Ensure prescription we sent that he requested. I was told by a representative that no food supplements of any kind are covered by Medicaid including the Ensure. I left my name and number. ( I called them at [phone].Option 4. )   Mobile number to call back stated Agustina Caroli  LM[facility] at home number

## 2014-01-30 NOTE — Telephone Encounter (Signed)
Patient and his sister called very upset that they cannot get the ensure filled with a prescription because Medicaid denied it.  Pam called Medicaid on 01-14-2014---Phone note below.  I advised patient that I can call Medicaid back to see what other options we have, patient said that him and his sister where told we can do an appeal online at [facility] website.  I called and spoke with Janett Billow. Per Janett Billow at [facility] anything that can be bought over the counter Medicaid will not pay for. Per Janett Billow there is not an off brand ensure. Per Janett Billow no appeals can be done because patient can buy ensure over the counter, prescription not needed.  I called patient back and explained what Janett Billow at [facility] said. Patient verbalized understanding.  Tonette Bihari, CMA at 01/14/2014 1:14 PM      Status: Signed         I called Medicaid and tried to do a prior authorization on the Ensure prescription we sent that he requested. I was told by a representative that no food supplements of any kind are covered by Medicaid including the Ensure. I left my name and number. ( I called them at [phone].Option 4. )

## 2014-02-10 ENCOUNTER — Ambulatory Visit (INDEPENDENT_AMBULATORY_CARE_PROVIDER_SITE_OTHER): Payer: Medicaid Other | Admitting: Physician Assistant

## 2014-02-10 ENCOUNTER — Encounter: Payer: Self-pay | Admitting: Physician Assistant

## 2014-02-10 ENCOUNTER — Other Ambulatory Visit (INDEPENDENT_AMBULATORY_CARE_PROVIDER_SITE_OTHER): Payer: Medicaid Other

## 2014-02-10 VITALS — BP 114/62 | HR 72 | Ht 68.5 in | Wt 208.0 lb

## 2014-02-10 DIAGNOSIS — K7460 Unspecified cirrhosis of liver: Secondary | ICD-10-CM

## 2014-02-10 DIAGNOSIS — R188 Other ascites: Secondary | ICD-10-CM

## 2014-02-10 LAB — BASIC METABOLIC PANEL
BUN: 10 mg/dL (ref 6–23)
CHLORIDE: 108 meq/L (ref 96–112)
CO2: 23 meq/L (ref 19–32)
Calcium: 8.3 mg/dL — ABNORMAL LOW (ref 8.4–10.5)
Creatinine, Ser: 0.7 mg/dL (ref 0.4–1.5)
GFR: 125.73 mL/min (ref >60.00)
Glucose, Bld: 80 mg/dL (ref 70–99)
Potassium: 3.9 mEq/L (ref 3.5–5.1)
Sodium: 135 mEq/L (ref 135–145)

## 2014-02-10 LAB — COMPREHENSIVE METABOLIC PANEL
ALBUMIN: 2.4 g/dL — AB (ref 3.5–5.2)
ALK PHOS: 156 U/L — AB (ref 39–117)
ALT: 127 U/L — ABNORMAL HIGH (ref 0–53)
AST: 248 U/L — AB (ref 0–37)
BILIRUBIN TOTAL: 3.2 mg/dL — AB (ref 0.3–1.2)
BUN: 10 mg/dL (ref 6–23)
CO2: 23 mEq/L (ref 19–32)
Calcium: 8.3 mg/dL — ABNORMAL LOW (ref 8.4–10.5)
Chloride: 108 mEq/L (ref 96–112)
Creatinine, Ser: 0.7 mg/dL (ref 0.4–1.5)
GFR: 125.73 mL/min (ref >60.00)
Glucose, Bld: 80 mg/dL (ref 70–99)
POTASSIUM: 3.9 meq/L (ref 3.5–5.1)
SODIUM: 135 meq/L (ref 135–145)
TOTAL PROTEIN: 7.3 g/dL (ref 6.0–8.3)

## 2014-02-10 LAB — CBC WITH DIFFERENTIAL/PLATELET
Basophils Absolute: 0 10*3/uL (ref 0.0–0.1)
Basophils Relative: 0.9 % (ref 0.0–3.0)
EOS ABS: 0.1 10*3/uL (ref 0.0–0.7)
Eosinophils Relative: 2.9 % (ref 0.0–5.0)
HCT: 34.1 % — ABNORMAL LOW (ref 39.0–52.0)
Hemoglobin: 11.4 g/dL — ABNORMAL LOW (ref 13.0–17.0)
Lymphocytes Relative: 32.4 % (ref 12.0–46.0)
Lymphs Abs: 1 10*3/uL (ref 0.7–4.0)
MCHC: 33.5 g/dL (ref 30.0–36.0)
MCV: 88.2 fl (ref 78.0–100.0)
MONO ABS: 0.5 10*3/uL (ref 0.1–1.0)
Monocytes Relative: 17 % — ABNORMAL HIGH (ref 3.0–12.0)
NEUTROS PCT: 46.8 % (ref 43.0–77.0)
Neutro Abs: 1.4 10*3/uL (ref 1.4–7.7)
PLATELETS: 81 10*3/uL — AB (ref 150.0–400.0)
RBC: 3.87 Mil/uL — ABNORMAL LOW (ref 4.22–5.81)
RDW: 26.8 % — ABNORMAL HIGH (ref 11.5–14.6)
WBC: 3 10*3/uL — ABNORMAL LOW (ref 4.5–10.5)

## 2014-02-10 MED ORDER — OXYCODONE-ACETAMINOPHEN 5-325 MG PO TABS: ORAL_TABLET | ORAL | Status: DC

## 2014-02-10 NOTE — Patient Instructions (Signed)
Please go to the basement level to have your labs drawn.  We have given you a prescription to take to your pharmacy.  Continue all medications same as you have been taking.  We made you a follow up appointment with Dr. Ardis Hughs for 04-01-2014 at 9:00 am .

## 2014-02-10 NOTE — Progress Notes (Signed)
Subjective:    Patient ID: Oscar Swanson, male    DOB: January 03, 1961, 53 y.o.   MRN: [id number]  HPI Oscar Swanson is a 53 year old white male known to Dr. Ardis Hughs who was last seen by myself approximately 2 weeks ago. He has history of decompensated cirrhosis secondary to EtOH and hepatitis C. Also with history of schizophrenia and substance abuse/crack.Marland Kitchen He currently lives with his sister who is his primary caregiver. He was hospitalized in February 2015 with acute congestive heart failure, pneumonia and anasarca. He underwent a TIPS procedure on 12/25/2013 and has tolerated this very well. He has been followed closely while diarrhea Candiss Norse and is down about 60 pounds from his heaviest weight. . At his last office visit his weight was 193. At that time we reduce his Lasix to 20 mg once daily and continue him on Aldactone 50 mg once daily. He has had followup since by interventional radiology, including Dopplers of his portal system and his TIPS is functioning well.  He comes back today for her scheduled followup. He says he has been doing well. He was not aware that he had gained any weight but is up about 15 pounds from his last office visit. He says he feels well has had less dizziness. He is continuing on Chronulac and states that he is having a couple of bowel movements per day. He does continue to complain of back pain and mid abdominal pain and states the Ultram was not helpful. When asked about drug and alcohol use he says he has not been using any drugs but admits that he did drink "a beer" about 3 weeks ago but has not been drinking any hard liquor.  Review of Systems  Constitutional: Negative.   HENT: Negative.   Eyes: Negative.   Respiratory: Negative.   Cardiovascular: Negative.   Gastrointestinal: Positive for abdominal pain.  Endocrine: Negative.   Genitourinary: Negative.   Musculoskeletal: Positive for back pain.  Skin: Negative.   Allergic/Immunologic: Negative.   Neurological:  Negative.   Hematological: Negative.   Psychiatric/Behavioral: Negative.    Outpatient Prescriptions Prior to Visit  Medication Sig Dispense Refill  . beclomethasone (QVAR) 80 MCG/ACT inhaler Inhale 2 puffs into the lungs 2 (two) times daily.       . folic acid (FOLVITE) 1 MG tablet Take 1 tablet (1 mg total) by mouth daily.  90 tablet  3  . furosemide (LASIX) 40 MG tablet Take 1 tablet (40 mg total) by mouth 2 (two) times daily.  30 tablet  0  . lactulose (CHRONULAC) 10 GM/15ML solution Take 30 ML in the breakfast and 30 ml at lunch.  500 mL  0  . ondansetron (ZOFRAN) 4 MG tablet Take 1 tablet (4 mg total) by mouth every 6 (six) hours as needed for nausea.  40 tablet  0  . pantoprazole (PROTONIX) 40 MG tablet Take 1 tablet (40 mg total) by mouth daily.  30 tablet  6  . rifaximin (XIFAXAN) 550 MG TABS tablet Take 1 tablet (550 mg total) by mouth 2 (two) times daily.  60 tablet  1  . spironolactone (ALDACTONE) 50 MG tablet Take 1 tab daily in the AM.  30 tablet  1  . thiamine (VITAMIN B-1) 100 MG tablet Take 100 mg by mouth daily.      . traMADol (ULTRAM) 50 MG tablet Take 1 tablet (50 mg total) by mouth every 6 (six) hours as needed.  40 tablet  0  . ENSURE (ENSURE) Take  1 Can by mouth 2 (two) times daily between meals.  237 mL  12   No facility-administered medications prior to visit.   Allergies  Allergen Reactions  . Haldol [Haloperidol]     afib problems  . Oxycodone Rash   Patient Active Problem List   Diagnosis Date Noted  . Hyponatremia 12/17/2013  . CHF exacerbation 12/10/2013  . Acute CHF 12/10/2013  . HCAP (healthcare-associated pneumonia) 12/10/2013  . Ascites 12/10/2013  . Hepatic failure secondary to chronic hepatic disease 12/02/2013  . Hepatitis C 12/02/2013  . Cellulitis 12/02/2013  . 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 Mild Coagulopathy 11/08/2013  . Cocaine abuse 11/08/2013  . Acute respiratory failure 11/08/2013  . Esophageal varices with bleeding  11/08/2013  . Hepatic encephalopathy 11/07/2013  . Schizophrenia 11/07/2013  . ? Diabetes 09/23/2013  . Smoking 09/23/2013  . Back pain 09/23/2013   History  Substance Use Topics  . Smoking status: Current Some Day Smoker -- 0.50 packs/day for 25 years    Types: Cigarettes  . Smokeless tobacco: Never Used     Comment: Tobacco information given 02/10/14  . Alcohol Use: Yes     Comment:  drink beer socially    family history includes Colon polyps in his sister; Hypertension in his mother; Lung cancer in his mother. There is no history of Colon cancer.     Objective:   Physical Exam  well-developed white male in no acute distress blood pressure 114/62 pulse 72 height 5 foot 8 weight 208 up 11 pounds from last office visit. HEENT; nontraumatic normocephalic EOMI PERRLA sclera anicteric, Supple; no JVD, Cardiovascular ;regular rate and rhythm with S1-S2 no murmur or gallop, Pulmonary ;clear bilaterally, Abdomen; soft no significant ascites appreciated nontender no palpable mass or hepatosplenomegaly, Extremities; 1+ edema in the lower terminates bilaterally below the knees, Psych; affect somewhat odd but appropriate        Assessment & Plan:  #1  53 yo WM with decompensated cirrhosis secondary to ETOH/ HepC presented with congestive heart failure and refractory anasarca in February of 2015 and is now status post TIPS. He has done well and has had a significant diuresis. His weight is up 12-15 pounds since last office visit after decreasing diuretic dosage but still does not have any significant edema or ascites. #2 hepatic encephalopathy stable and asymptomatic currently #3 history of schizophrenia #4 history of substance abuse/crack cocaine-patient states inactive History of esophageal varices  Plan; repeat labs today to include CBC  and EtOH level  Will continue erratic said current dosages, Aldactone 50 mg by mouth every morning and Lasix 20 mg by mouth every morning.  Advised  complete abstinence from alcohol again Percocet 5/325 -no more than one daily and only to be used for severe pain #25 and no refills .I made it clear to him that we would not be prescribing pain medication on a regular basis  He will followup with Dr. Ardis Hughs in 4-5 weeks

## 2014-02-11 ENCOUNTER — Other Ambulatory Visit: Payer: Self-pay | Admitting: *Deleted

## 2014-02-11 DIAGNOSIS — K7460 Unspecified cirrhosis of liver: Secondary | ICD-10-CM

## 2014-02-11 LAB — ETHANOL: Alcohol, Ethyl (B): <10 mg/dL (ref 0–10)

## 2014-02-11 NOTE — Progress Notes (Signed)
i agree with the above plan

## 2014-02-19 ENCOUNTER — Emergency Department (HOSPITAL_COMMUNITY)
Admission: EM | Admit: 2014-02-19 | Discharge: 2014-02-19 | Disposition: A | Discharge status: Home / Self Care | Payer: Medicaid Other | Attending: Emergency Medicine | Admitting: Emergency Medicine

## 2014-02-19 ENCOUNTER — Encounter (HOSPITAL_COMMUNITY): Payer: Self-pay | Admitting: Emergency Medicine

## 2014-02-19 DIAGNOSIS — Z862 Personal history of diseases of the blood and blood-forming organs and certain disorders involving the immune mechanism: Secondary | ICD-10-CM | POA: Diagnosis not present

## 2014-02-19 DIAGNOSIS — I509 Heart failure, unspecified: Secondary | ICD-10-CM | POA: Diagnosis not present

## 2014-02-19 DIAGNOSIS — F458 Other somatoform disorders: Secondary | ICD-10-CM | POA: Diagnosis not present

## 2014-02-19 DIAGNOSIS — IMO0002 Reserved for concepts with insufficient information to code with codable children: Secondary | ICD-10-CM | POA: Insufficient documentation

## 2014-02-19 DIAGNOSIS — Z008 Encounter for other general examination: Secondary | ICD-10-CM | POA: Diagnosis present

## 2014-02-19 DIAGNOSIS — F1410 Cocaine abuse, uncomplicated: Secondary | ICD-10-CM

## 2014-02-19 DIAGNOSIS — K7030 Alcoholic cirrhosis of liver without ascites: Secondary | ICD-10-CM | POA: Diagnosis not present

## 2014-02-19 DIAGNOSIS — R4689 Other symptoms and signs involving appearance and behavior: Secondary | ICD-10-CM | POA: Diagnosis present

## 2014-02-19 DIAGNOSIS — D689 Coagulation defect, unspecified: Secondary | ICD-10-CM

## 2014-02-19 DIAGNOSIS — Z8611 Personal history of tuberculosis: Secondary | ICD-10-CM | POA: Diagnosis not present

## 2014-02-19 DIAGNOSIS — J189 Pneumonia, unspecified organism: Secondary | ICD-10-CM

## 2014-02-19 DIAGNOSIS — Z79899 Other long term (current) drug therapy: Secondary | ICD-10-CM | POA: Insufficient documentation

## 2014-02-19 DIAGNOSIS — E119 Type 2 diabetes mellitus without complications: Secondary | ICD-10-CM | POA: Insufficient documentation

## 2014-02-19 DIAGNOSIS — K7682 Hepatic encephalopathy: Secondary | ICD-10-CM

## 2014-02-19 DIAGNOSIS — E871 Hypo-osmolality and hyponatremia: Secondary | ICD-10-CM

## 2014-02-19 DIAGNOSIS — F209 Schizophrenia, unspecified: Secondary | ICD-10-CM | POA: Diagnosis not present

## 2014-02-19 DIAGNOSIS — K7290 Hepatic failure, unspecified without coma: Secondary | ICD-10-CM

## 2014-02-19 DIAGNOSIS — L0390 Cellulitis, unspecified: Secondary | ICD-10-CM

## 2014-02-19 DIAGNOSIS — R188 Other ascites: Secondary | ICD-10-CM

## 2014-02-19 DIAGNOSIS — Z8659 Personal history of other mental and behavioral disorders: Secondary | ICD-10-CM

## 2014-02-19 DIAGNOSIS — K7460 Unspecified cirrhosis of liver: Secondary | ICD-10-CM

## 2014-02-19 DIAGNOSIS — M549 Dorsalgia, unspecified: Secondary | ICD-10-CM

## 2014-02-19 DIAGNOSIS — B1920 Unspecified viral hepatitis C without hepatic coma: Secondary | ICD-10-CM

## 2014-02-19 DIAGNOSIS — I8501 Esophageal varices with bleeding: Secondary | ICD-10-CM

## 2014-02-19 DIAGNOSIS — F17200 Nicotine dependence, unspecified, uncomplicated: Secondary | ICD-10-CM | POA: Insufficient documentation

## 2014-02-19 DIAGNOSIS — Z8619 Personal history of other infectious and parasitic diseases: Secondary | ICD-10-CM | POA: Insufficient documentation

## 2014-02-19 DIAGNOSIS — J9600 Acute respiratory failure, unspecified whether with hypoxia or hypercapnia: Secondary | ICD-10-CM

## 2014-02-19 DIAGNOSIS — Z046 Encounter for general psychiatric examination, requested by authority: Secondary | ICD-10-CM

## 2014-02-19 LAB — RAPID URINE DRUG SCREEN, HOSP PERFORMED
Amphetamines: NOT DETECTED
BENZODIAZEPINES: NOT DETECTED
Barbiturates: NOT DETECTED
COCAINE: NOT DETECTED
Opiates: NOT DETECTED
Tetrahydrocannabinol: NOT DETECTED

## 2014-02-19 LAB — COMPREHENSIVE METABOLIC PANEL
ALT: 57 U/L — ABNORMAL HIGH (ref 0–53)
AST: 96 U/L — ABNORMAL HIGH (ref 0–37)
Albumin: 2.3 g/dL — ABNORMAL LOW (ref 3.5–5.2)
Alkaline Phosphatase: 168 U/L — ABNORMAL HIGH (ref 39–117)
BUN: 9 mg/dL (ref 6–23)
CALCIUM: 8.8 mg/dL (ref 8.4–10.5)
CO2: 23 mEq/L (ref 19–32)
Chloride: 104 mEq/L (ref 96–112)
Creatinine, Ser: 0.73 mg/dL (ref 0.50–1.35)
GFR calc non Af Amer: >90 mL/min (ref >90)
GLUCOSE: 67 mg/dL — AB (ref 70–99)
Potassium: 3.8 mEq/L (ref 3.7–5.3)
Sodium: 135 mEq/L — ABNORMAL LOW (ref 137–147)
Total Bilirubin: 2.6 mg/dL — ABNORMAL HIGH (ref 0.3–1.2)
Total Protein: 7.6 g/dL (ref 6.0–8.3)

## 2014-02-19 LAB — CBC
HEMATOCRIT: 32.4 % — AB (ref 39.0–52.0)
HEMOGLOBIN: 10.7 g/dL — AB (ref 13.0–17.0)
MCH: 29.2 pg (ref 26.0–34.0)
MCHC: 33 g/dL (ref 30.0–36.0)
MCV: 88.3 fL (ref 78.0–100.0)
Platelets: 58 10*3/uL — ABNORMAL LOW (ref 150–400)
RBC: 3.67 MIL/uL — ABNORMAL LOW (ref 4.22–5.81)
RDW: 24.3 % — ABNORMAL HIGH (ref 11.5–15.5)
WBC: 3.2 10*3/uL — ABNORMAL LOW (ref 4.0–10.5)

## 2014-02-19 LAB — PROTIME-INR
INR: 1.67 — ABNORMAL HIGH (ref 0.00–1.49)
Prothrombin Time: 19.2 seconds — ABNORMAL HIGH (ref 11.6–15.2)

## 2014-02-19 LAB — ETHANOL

## 2014-02-19 MED ORDER — ONDANSETRON HCL 4 MG PO TABS: 4.0000 mg | ORAL_TABLET | Freq: Three times a day (TID) | ORAL | Status: DC | PRN

## 2014-02-19 MED ORDER — ALUM & MAG HYDROXIDE-SIMETH 200-200-20 MG/5ML PO SUSP: 30.0000 mL | ORAL | Status: DC | PRN

## 2014-02-19 MED ORDER — FOLIC ACID 1 MG PO TABS
1.0000 mg | ORAL_TABLET | Freq: Every day | ORAL | Status: DC
  Administered 2014-02-19: 1 mg via ORAL
  Filled 2014-02-19: qty 1

## 2014-02-19 MED ORDER — RIFAXIMIN 550 MG PO TABS
550.0000 mg | ORAL_TABLET | Freq: Two times a day (BID) | ORAL | Status: DC
  Administered 2014-02-19: 550 mg via ORAL
  Filled 2014-02-19 (×2): qty 1

## 2014-02-19 MED ORDER — PANTOPRAZOLE SODIUM 40 MG PO TBEC
40.0000 mg | DELAYED_RELEASE_TABLET | Freq: Every day | ORAL | Status: DC
  Administered 2014-02-19: 40 mg via ORAL
  Filled 2014-02-19: qty 1

## 2014-02-19 MED ORDER — SPIRONOLACTONE 100 MG PO TABS
200.0000 mg | ORAL_TABLET | Freq: Every day | ORAL | Status: DC
  Administered 2014-02-19: 200 mg via ORAL
  Filled 2014-02-19: qty 2

## 2014-02-19 MED ORDER — LACTULOSE 10 GM/15ML PO SOLN: 10.0000 g | Freq: Every day | ORAL | Status: DC

## 2014-02-19 MED ORDER — NICOTINE 21 MG/24HR TD PT24: 21.0000 mg | MEDICATED_PATCH | Freq: Every day | TRANSDERMAL | Status: DC

## 2014-02-19 MED ORDER — VITAMIN B-1 100 MG PO TABS
100.0000 mg | ORAL_TABLET | Freq: Every day | ORAL | Status: DC
  Administered 2014-02-19: 100 mg via ORAL
  Filled 2014-02-19: qty 1

## 2014-02-19 MED ORDER — ENSURE COMPLETE PO LIQD
1.0000 | Freq: Two times a day (BID) | ORAL | Status: DC
Start: 2014-02-19 — End: 2014-02-19
  Filled 2014-02-19: qty 237

## 2014-02-19 MED ORDER — LACTULOSE 10 GM/15ML PO SOLN
20.0000 g | Freq: Three times a day (TID) | ORAL | Status: DC
  Administered 2014-02-19: 20 g via ORAL
  Filled 2014-02-19 (×2): qty 30

## 2014-02-19 MED ORDER — FLUTICASONE PROPIONATE HFA 44 MCG/ACT IN AERO
2.0000 | INHALATION_SPRAY | Freq: Two times a day (BID) | RESPIRATORY_TRACT | Status: DC
  Filled 2014-02-19: qty 10.6

## 2014-02-19 MED ORDER — FUROSEMIDE 40 MG PO TABS: 40.0000 mg | ORAL_TABLET | Freq: Two times a day (BID) | ORAL | Status: DC

## 2014-02-19 MED ORDER — SPIRONOLACTONE 25 MG PO TABS: 25.0000 mg | ORAL_TABLET | Freq: Every day | ORAL | Status: DC

## 2014-02-19 NOTE — Consult Note (Signed)
[facility] Consult   Reason for Consult:  IVC related to aggressive behavior Referring Physician:  EDP  Oscar Swanson is an 53 y.o. male. Total Time spent with patient: 45 minutes  Assessment: AXIS I:  See current hospital problem list and history of schizophrenia AXIS II:  Deferred AXIS III:   Past Medical History  Diagnosis Date  . Diabetes mellitus without complication   . Schizophrenia     onset age 42.  in and out of psych facilities from age 76 to age 62.  s/p electroconvulsive therapy.  . TB (tuberculosis)     sister says this dx'd at age 43 and treated  . Hepatic encephalopathy   . Esophageal varices   . GI bleed   . Cirrhosis   . Thrombocytopenia   . Alcohol abuse   . Mild Coagulopathy 11/08/2013  . Hepatitis C 12/02/2013  . Smoking 09/23/2013   AXIS IV:  other psychosocial or environmental problems and problems with primary support group AXIS V:  61-70 mild symptoms  Plan:  No evidence of imminent risk to self or others at present.   Patient does not meet criteria for psychiatric inpatient admission. Supportive therapy provided about ongoing stressors. Discussed crisis plan, support from social network, calling 911, coming to the Emergency Department, and calling Suicide Hotline.  Subjective:   Oscar Swanson is a 53 y.o. male patient.  HPI:  Patient states "That there was no reason for him to be here.  We all get into arguments and cuss each other out.  I have never hit or tried to hurt nobody.  I don't get along with my brother in law but I have never put my hands on him.  I got up set today cause it is the second time I been bit by dog.  No I don't want to hurt my self or nobody else." Patient denies suicidal/homicidal ideation, psychosis, and paranoia. Patient states that he is not taking any mental health medicines.  "I don't need any."    Consulted with CW Arby Barrette) who spoke with the sister of sister Lynnell Chad)  Who states that patient has  been argumentative; but never physically threatening; states that he has threaten to call the police; but that they had never been in danger.  States that her concern is that patient is not taking his medication.   HPI Elements:   Location:  IVC related to aggressive behavior. Quality:  Patient denies. Severity:  Patient denies. Timing:  Several days. Review of Systems  Gastrointestinal: Negative for nausea, vomiting, abdominal pain, diarrhea and constipation.  Musculoskeletal: Negative.   Neurological: Negative for tremors and headaches.  Psychiatric/Behavioral: Negative for depression, suicidal ideas, hallucinations and substance abuse. The patient is nervous/anxious. The patient does not have insomnia.      Denies family history of mental illness or substance abuse Past Psychiatric History: Past Medical History  Diagnosis Date  . Diabetes mellitus without complication   . Schizophrenia     onset age 31.  in and out of psych facilities from age 76 to age 49.  s/p electroconvulsive therapy.  . TB (tuberculosis)     sister says this dx'd at age 26 and treated  . Hepatic encephalopathy   . Esophageal varices   . GI bleed   . Cirrhosis   . Thrombocytopenia   . Alcohol abuse   . Mild Coagulopathy 11/08/2013  . Hepatitis C 12/02/2013  . Smoking 09/23/2013    reports that he has  been smoking Cigarettes.  He has a 12.5 pack-year smoking history. He has never used smokeless tobacco. He reports that he drinks alcohol. He reports that he does not use illicit drugs. Family History  Problem Relation Age of Onset  . Hypertension Mother   . Colon cancer Neg Hx   . Lung cancer Mother     smoker  . Colon polyps Sister     twin sister   Family History Substance Abuse: No Family Supports: No Living Arrangements: Non-relatives/Friends Doctor, hospital ) Can pt return to current living arrangement?: Yes Abuse/Neglect [facility]) Physical Abuse: Denies Verbal Abuse: Denies Sexual Abuse:  Denies Allergies:   Allergies  Allergen Reactions  . Haldol [Haloperidol]     afib problems  . Oxycodone Rash    ACT Assessment Complete:  Yes:    Educational Status    Risk to Self: Risk to self Suicidal Ideation: No Suicidal Intent: No Is patient at risk for suicide?: No Suicidal Plan?: No Access to Means: No What has been your use of drugs/alcohol within the last 12 months?: occasional alcohol use Previous Attempts/Gestures: No How many times?: 0 Other Self Harm Risks: none Triggers for Past Attempts:  (n/a) Intentional Self Injurious Behavior: None Family Suicide History: No Recent stressful life event(s): Other (Comment);Recent negative physical changes (has had to live w/ sister after surgery) Persecutory voices/beliefs?: No Depression: No Substance abuse history and/or treatment for substance abuse?: No Suicide prevention information given to non-admitted patients: Not applicable  Risk to Others: Risk to Others Homicidal Ideation: No Thoughts of Harm to Others: No Current Homicidal Intent: No Current Homicidal Plan: No Access to Homicidal Means: No Identified Victim: none History of harm to others?: No Assessment of Violence: None Noted Violent Behavior Description: pt denies hx violence Does patient have access to weapons?: No Criminal Charges Pending?: Yes Describe Pending Criminal Charges: injury to personal property and 2nd degree trespass Does patient have a court date: Yes Court Date: 03/13/14  Abuse: Abuse/Neglect Assessment (Assessment to be complete while patient is alone) Physical Abuse: Denies Verbal Abuse: Denies Sexual Abuse: Denies Exploitation of patient/patient's resources: Denies Self-Neglect: Denies  Prior Inpatient Therapy: Prior Inpatient Therapy Prior Inpatient Therapy: Yes Prior Therapy Dates: over several years Prior Therapy Facilty/Provider(s): several facilities in [place] Reason for Treatment: for "being outrageous"  Prior Outpatient  Therapy: Prior Outpatient Therapy Prior Outpatient Therapy: Yes Prior Therapy Dates: in the past Prior Therapy Facilty/Provider(s): unknown Reason for Treatment: med management  Additional Information: Additional Information 1:1 In Past 12 Months?: No CIRT Risk: No Elopement Risk: No Does patient have medical clearance?: Yes                  Objective: Blood pressure 122/61, pulse 84, temperature 99.8 F (37.7 C), temperature source Oral, resp. rate 18, SpO2 100.00%.There is no weight on file to calculate BMI. Results for orders placed during the hospital encounter of 02/19/14 (from the past 72 hour(s))  URINE RAPID DRUG SCREEN (HOSP PERFORMED)     Status: None   Collection Time    02/19/14  2:08 PM      Result Value Ref Range   Opiates NONE DETECTED  NONE DETECTED   Cocaine NONE DETECTED  NONE DETECTED   Benzodiazepines NONE DETECTED  NONE DETECTED   Amphetamines NONE DETECTED  NONE DETECTED   Tetrahydrocannabinol NONE DETECTED  NONE DETECTED   Barbiturates NONE DETECTED  NONE DETECTED   Comment:            DRUG SCREEN  FOR MEDICAL PURPOSES     ONLY.  IF CONFIRMATION IS NEEDED     FOR ANY PURPOSE, NOTIFY LAB     WITHIN 5 DAYS.                LOWEST DETECTABLE LIMITS     FOR URINE DRUG SCREEN     Drug Class       Cutoff (ng/mL)     Amphetamine      1000     Barbiturate      200     Benzodiazepine   867     Tricyclics       672     Opiates          300     Cocaine          300     THC              50  CBC     Status: Abnormal   Collection Time    02/19/14  2:44 PM      Result Value Ref Range   WBC 3.2 (*) 4.0 - 10.5 K/uL   RBC 3.67 (*) 4.22 - 5.81 MIL/uL   Hemoglobin 10.7 (*) 13.0 - 17.0 g/dL   HCT 32.4 (*) 39.0 - 52.0 %   MCV 88.3  78.0 - 100.0 fL   MCH 29.2  26.0 - 34.0 pg   MCHC 33.0  30.0 - 36.0 g/dL   RDW 24.3 (*) 11.5 - 15.5 %   Platelets 58 (*) 150 - 400 K/uL   Comment: REPEATED TO VERIFY     SPECIMEN CHECKED FOR CLOTS     PLATELET COUNT  CONFIRMED BY SMEAR  COMPREHENSIVE METABOLIC PANEL     Status: Abnormal   Collection Time    02/19/14  2:44 PM      Result Value Ref Range   Sodium 135 (*) 137 - 147 mEq/L   Potassium 3.8  3.7 - 5.3 mEq/L   Chloride 104  96 - 112 mEq/L   CO2 23  19 - 32 mEq/L   Glucose, Bld 67 (*) 70 - 99 mg/dL   BUN 9  6 - 23 mg/dL   Creatinine, Ser 0.73  0.50 - 1.35 mg/dL   Calcium 8.8  8.4 - 10.5 mg/dL   Total Protein 7.6  6.0 - 8.3 g/dL   Albumin 2.3 (*) 3.5 - 5.2 g/dL   AST 96 (*) 0 - 37 U/L   ALT 57 (*) 0 - 53 U/L   Alkaline Phosphatase 168 (*) 39 - 117 U/L   Total Bilirubin 2.6 (*) 0.3 - 1.2 mg/dL   GFR calc non Af Amer >90  >90 mL/min   GFR calc Af Amer >90  >90 mL/min   Comment: (NOTE)     The eGFR has been calculated using the CKD EPI equation.     This calculation has not been validated in all clinical situations.     eGFR's persistently <90 mL/min signify possible Chronic Kidney     Disease.  ETHANOL     Status: None   Collection Time    02/19/14  2:44 PM      Result Value Ref Range   Alcohol, Ethyl (B) <11  0 - 11 mg/dL   Comment:            LOWEST DETECTABLE LIMIT FOR     SERUM ALCOHOL IS 11 mg/dL     FOR MEDICAL PURPOSES ONLY  PROTIME-INR  Status: Abnormal   Collection Time    02/19/14  2:44 PM      Result Value Ref Range   Prothrombin Time 19.2 (*) 11.6 - 15.2 seconds   INR 1.67 (*) 0.00 - 1.49   Labs are reviewed and are pertinent for no critical values.  Home medications review and no changes made.   Current Facility-Administered Medications  Medication Dose Route Frequency Provider Last Rate Last Dose  . alum & mag hydroxide-simeth (MAALOX/MYLANTA) 200-200-20 MG/5ML suspension 30 mL  30 mL Oral PRN Kristen N Ward, DO      . feeding supplement (ENSURE COMPLETE) (ENSURE COMPLETE) liquid 237 mL  1 Container Oral BID BM Kristen N Ward, DO      . fluticasone (FLOVENT HFA) 44 MCG/ACT inhaler 2 puff  2 puff Inhalation BID Kristen N Ward, DO      . folic acid (FOLVITE)  tablet 1 mg  1 mg Oral Daily Kristen N Ward, DO      . furosemide (LASIX) tablet 40 mg  40 mg Oral BID Kristen N Ward, DO      . lactulose (CHRONULAC) 10 GM/15ML solution 20 g  20 g Oral TID Kristen N Ward, DO      . nicotine (NICODERM CQ - dosed in mg/24 hours) patch 21 mg  21 mg Transdermal Daily Kristen N Ward, DO      . ondansetron (ZOFRAN) tablet 4 mg  4 mg Oral Q8H PRN Kristen N Ward, DO      . pantoprazole (PROTONIX) EC tablet 40 mg  40 mg Oral Daily Kristen N Ward, DO      . rifaximin (XIFAXAN) tablet 550 mg  550 mg Oral BID Kristen N Ward, DO      . spironolactone (ALDACTONE) tablet 200 mg  200 mg Oral Daily Kristen N Ward, DO      . thiamine (VITAMIN B-1) tablet 100 mg  100 mg Oral Daily Kristen N Ward, DO       Current Outpatient Prescriptions  Medication Sig Dispense Refill  . beclomethasone (QVAR) 80 MCG/ACT inhaler Inhale 2 puffs into the lungs 2 (two) times daily.       . folic acid (FOLVITE) 1 MG tablet Take 1 tablet (1 mg total) by mouth daily.  90 tablet  3  . furosemide (LASIX) 40 MG tablet Take 1 tablet (40 mg total) by mouth 2 (two) times daily.  30 tablet  0  . lactulose (CHRONULAC) 10 GM/15ML solution Take 20 g by mouth. Take 20 G (30 ml) at breakfast and take 20 G (30 ml) at lunch.      . ondansetron (ZOFRAN) 4 MG tablet Take 1 tablet (4 mg total) by mouth every 6 (six) hours as needed for nausea.  40 tablet  0  . oxyCODONE-acetaminophen (PERCOCET/ROXICET) 5-325 MG per tablet Take 1 tab only per day as needed for pain.  25 tablet  0  . pantoprazole (PROTONIX) 40 MG tablet Take 1 tablet (40 mg total) by mouth daily.  30 tablet  6  . rifaximin (XIFAXAN) 550 MG TABS tablet Take 1 tablet (550 mg total) by mouth 2 (two) times daily.  60 tablet  1  . spironolactone (ALDACTONE) 50 MG tablet Take 50 mg by mouth every morning.      . thiamine (VITAMIN B-1) 100 MG tablet Take 100 mg by mouth daily.      Marland Kitchen ENSURE (ENSURE) Take 1 Can by mouth 2 (two) times daily between meals.  [place]ades  mL  12    Psychiatric Specialty Exam:     Blood pressure 122/61, pulse 84, temperature 99.8 F (37.7 C), temperature source Oral, resp. rate 18, SpO2 100.00%.There is no weight on file to calculate BMI.  General Appearance: Casual  Eye Contact::  Good  Speech:  Clear and Coherent and Normal Rate  Volume:  Normal  Mood:  Anxious  Affect:  Appropriate and Congruent  Thought Process:  Circumstantial, Coherent and Goal Directed  Orientation:  Full (Time, Place, and Person)  Thought Content:  WDL  Suicidal Thoughts:  No  Homicidal Thoughts:  No  Memory:  Immediate;   Good Recent;   Good Remote;   Good  Judgement:  Intact  Insight:  Present  Psychomotor Activity:  Normal  Concentration:  Good  Recall:  Good  Fund of Knowledge:Good  Language: Good  Akathisia:  No  Handed:  Right  AIMS (if indicated):     Assets:  Communication Skills Desire for Improvement  Sleep:      Musculoskeletal: Strength & Muscle Tone: within normal limits Gait & Station: normal Patient leans: N/A  Treatment Plan Summary: Outpatient resources  Disposition:  Resend IVC.  Discharge home.  Give patient resources for outpatient resources.  Discharge Assessment     Demographic Factors:  Male and Caucasian  Total Time spent with patient: 15 minutes  Psychiatric Specialty Exam: Same as above  Musculoskeletal: Same as above  Mental Status Per Nursing Assessment::   On Admission:     Current Mental Status by Physician: Patient denies suicidal/homicidal ideation, psychosis, and paranoia  Loss Factors: NA  Historical Factors: NA  Risk Reduction Factors:   Living with another person, especially a relative, Positive social support and Positive coping skills or problem solving skills  Continued Clinical Symptoms:  History of Schizophrenia  Cognitive Features That Contribute To Risk:  None noted    Suicide Risk:  Minimal: No identifiable suicidal ideation.  Patients presenting with no  risk factors but with morbid ruminations; may be classified as minimal risk based on the severity of the depressive symptoms  Discharge Diagnoses:  Same as above  Plan Of Care/Follow-up recommendations:  Activity:  Resume usual activity Diet:  Resume usual diet  Is patient on multiple antipsychotic therapies at discharge:  No   Has Patient had three or more failed trials of antipsychotic monotherapy by history:  No  Recommended Plan for Multiple Antipsychotic Therapies: NA  Francee Setzer, FNP-BC 02/19/2014 3:44 PM

## 2014-02-19 NOTE — BH Assessment (Signed)
Assessment Note  Oscar Swanson is an 53 y.o. male. Pt presents to [facility] under IVC taken out by pt's stepsister with whom he lives. Per IVC, "pt has not used any medication for an extended period of time. Respondent constantly talks to himself, speaks in an irrational manner and is verbally abusive". Pt calm and cooperative at time of assessment. He denies SI and HI. Pt has no hx of suicide attempts. Pt sts he was in the [employer] for 16 years but was discharged in the 1980s. Pt sts he drinks "a beer every two weeks". Pt sts he lives w/ his 31 and her husband and pt often argues with his sister. Pt describes mood as "usually happy". His affect is mood congruent.  Pt has court date May 16 for second degree trespassing. Pt sts his parents died when pt was 26 yo. Pt sts he has been hospitalized several times in mental health facilities in [place]. He denies having a MH dx and states he is hospitalized when he is being "outrageous". Pt oriented x 4. Pt denies [facility] and no delusions noted. Pt denies hx of violence. Pt stated an argument began last night after one of sister's large dogs accidentally bit pt. Pt sts he didn't get mad but that sister's brother in law began yelling at him to stop playing with their dogs. UDS wasn't completed at time of assessment.   Collateral info provided by sister Lynnell Chad [phone]. Sister sts pt doesn't take any psych medications and that pt "talks a lot out in the yard and hears voices". Sister sts pt is verbally abusive.  Sister denies that pt makes threatening remarks to sister. She says that pt sts he will get sister arrested.  Pt's IVC has been rescinded by Dr. Lovena Le. Pt will be d/c and pt states he will stay with a friend upon discharge.    Axis I: Schizophrenia Axis II: Deferred Axis III:  Past Medical History  Diagnosis Date  . Diabetes mellitus without complication   . Schizophrenia     onset age 21.  in and out of psych facilities from age 5 to age 46.  s/p  electroconvulsive therapy.  . TB (tuberculosis)     sister says this dx'd at age 5 and treated  . Hepatic encephalopathy   . Esophageal varices   . GI bleed   . Cirrhosis   . Thrombocytopenia   . Alcohol abuse   . Mild Coagulopathy 11/08/2013  . Hepatitis C 12/02/2013  . Smoking 09/23/2013   Axis IV: other psychosocial or environmental problems, problems related to social environment and problems with primary support group Axis V: 61-70 mild symptoms  Past Medical History:  Past Medical History  Diagnosis Date  . Diabetes mellitus without complication   . Schizophrenia     onset age 89.  in and out of psych facilities from age 42 to age 48.  s/p electroconvulsive therapy.  . TB (tuberculosis)     sister says this dx'd at age 66 and treated  . Hepatic encephalopathy   . Esophageal varices   . GI bleed   . Cirrhosis   . Thrombocytopenia   . Alcohol abuse   . Mild Coagulopathy 11/08/2013  . Hepatitis C 12/02/2013  . Smoking 09/23/2013    Past Surgical History  Procedure Laterality Date  . Cataract surgery  Bilateral   . Esophagogastroduodenoscopy N/A 11/08/2013    Procedure: ESOPHAGOGASTRODUODENOSCOPY (EGD);  Surgeon: Milus Banister, MD;  Location: [facility];  Service: Endoscopy;  Laterality: N/A;  bedside  . Radiology with anesthesia N/A 12/26/2013    Procedure: Transjugular Intrahepatic Portosystemic Shunt (TIPS);  Surgeon: Carylon Perches, MD;  Location: [facility];  Service: Radiology;  Laterality: N/A;    Family History:  Family History  Problem Relation Age of Onset  . Hypertension Mother   . Colon cancer Neg Hx   . Lung cancer Mother     smoker  . Colon polyps Sister     twin sister    Social History:  reports that he has been smoking Cigarettes.  He has a 12.5 pack-year smoking history. He has never used smokeless tobacco. He reports that he drinks alcohol. He reports that he does not use illicit drugs.  Additional Social History:  Alcohol / Drug Use Pain Medications:  see PTA meds list - pt denies abuse Prescriptions: see PTA meds list - pt denies abuse Over the Counter: see PTA meds list - pt denies abuse History of alcohol / drug use?: Yes Substance #1 Name of Substance 1: alcohol 1 - Frequency: one beer every 2 weeks  CIWA: CIWA-Ar BP: 122/61 mmHg Pulse Rate: 84 COWS:    Allergies:  Allergies  Allergen Reactions  . Haldol [Haloperidol]     afib problems  . Oxycodone Rash    Home Medications:  (Not in a hospital admission)  OB/GYN Status:  No LMP for male patient.  General Assessment Data Location of Assessment: [facility] ED Is this a Tele or Face-to-Face Assessment?: Face-to-Face Is this an Initial Assessment or a Re-assessment for this encounter?: Initial Assessment Living Arrangements: Non-relatives/Friends (stepssister ) Can pt return to current living arrangement?: Yes Admission Status: Involuntary Is patient capable of signing voluntary admission?: Yes Transfer from: Home Referral Source: Self/Family/Friend     [place] Living Arrangements: Non-relatives/Friends Doctor, hospital ) Name of Psychiatrist: none Name of Therapist: none  Education Status Is patient currently in school?: No  Risk to self Suicidal Ideation: No Suicidal Intent: No Is patient at risk for suicide?: No Suicidal Plan?: No Access to Means: No What has been your use of drugs/alcohol within the last 12 months?: occasional alcohol use Previous Attempts/Gestures: No How many times?: 0 Other Self Harm Risks: none Triggers for Past Attempts:  (n/a) Intentional Self Injurious Behavior: None Family Suicide History: No Recent stressful life event(s): Other (Comment);Recent negative physical changes (has had to live w/ sister after surgery) Persecutory voices/beliefs?: No Depression: No Substance abuse history and/or treatment for substance abuse?: No Suicide prevention information given to non-admitted patients: Not applicable  Risk to  Others Homicidal Ideation: No Thoughts of Harm to Others: No Current Homicidal Intent: No Current Homicidal Plan: No Access to Homicidal Means: No Identified Victim: none History of harm to others?: No Assessment of Violence: None Noted Violent Behavior Description: pt denies hx violence Does patient have access to weapons?: No Criminal Charges Pending?: Yes Describe Pending Criminal Charges: injury to personal property and 2nd degree trespass Does patient have a court date: Yes Court Date: 03/13/14  Psychosis Hallucinations: None noted Delusions: None noted  Mental Status Report Appear/Hygiene: Disheveled;Other (Comment) (poor dental hygiene) Eye Contact: Good Motor Activity: Freedom of movement Speech: Logical/coherent Level of Consciousness: Alert Mood: Other (Comment) (euthymic) Affect: Appropriate to circumstance Anxiety Level: None Thought Processes: Coherent;Relevant Judgement: Unimpaired Orientation: Person;Place;Time;Situation Obsessive Compulsive Thoughts/Behaviors: None  Cognitive Functioning Concentration: Normal Memory: Recent Intact;Remote Intact IQ: Average Insight: Fair Impulse Control: Fair Appetite: Good Sleep: No Change Total Hours of Sleep: 8  Vegetative Symptoms: None  ADLScreening [facility] Assessment Services) Patient's cognitive ability adequate to safely complete daily activities?: Yes Patient able to express need for assistance with ADLs?: Yes Independently performs ADLs?: Yes (appropriate for developmental age)  Prior Inpatient Therapy Prior Inpatient Therapy: Yes Prior Therapy Dates: over several years Prior Therapy Facilty/Provider(s): several facilities in [place] Reason for Treatment: for "being outrageous"  Prior Outpatient Therapy Prior Outpatient Therapy: Yes Prior Therapy Dates: in the past Prior Therapy Facilty/Provider(s): unknown Reason for Treatment: med management  ADL Screening (condition at time of admission) Patient's  cognitive ability adequate to safely complete daily activities?: Yes Is the patient deaf or have difficulty hearing?: No Does the patient have difficulty seeing, even when wearing glasses/contacts?: No Does the patient have difficulty concentrating, remembering, or making decisions?: No Patient able to express need for assistance with ADLs?: Yes Does the patient have difficulty dressing or bathing?: No Independently performs ADLs?: Yes (appropriate for developmental age) Does the patient have difficulty walking or climbing stairs?: No Weakness of Legs: None Weakness of Arms/Hands: None       Abuse/Neglect Assessment (Assessment to be complete while patient is alone) Physical Abuse: Denies Verbal Abuse: Denies Sexual Abuse: Denies Exploitation of patient/patient's resources: Denies Self-Neglect: Denies Values / Beliefs Cultural Requests During Hospitalization: None Spiritual Requests During Hospitalization: None   Advance Directives (For Healthcare) Advance Directive: Patient does not have advance directive;Patient would not like information    Additional Information 1:1 In Past 12 Months?: No CIRT Risk: No Elopement Risk: No Does patient have medical clearance?: Yes     Disposition:  Disposition Initial Assessment Completed for this Encounter: Yes Disposition of Patient: Other dispositions Other disposition(s): Other (Comment) (pt's IVC rescinded & will be d/c to home of friend)  On Site Evaluation by:   Reviewed with Physician:    Nyoka Lint 02/19/2014 3:11 PM

## 2014-02-19 NOTE — ED Notes (Signed)
Pt presents w/ [facility] after being IVC'd.  IVC paperwork sts "Pt has a long history of mental illness.  Pt has been diagnosed w/ schizophrenia and has not been using medications for a long time.  Pt constantly talks to himself, speaks in an irrational manner, and is verbally abusive.  Pt is agitated and intermittently abuses ETOH.  Pt is argumentative towards petitioner and his brother-in-law.  Pt recently made a false police report against sister and brother-in-law.  Pt, also, suffers from cirrhosis.  Pt appears to be a danger to himself and possibly others."  Pt reports that he recently had a "run in" with his sister and brother-in-law and was bitten by their dog.  Sts that he takes his medications.  Denies SI, HI, and hallucinations.

## 2014-02-19 NOTE — ED Provider Notes (Addendum)
TIME SEEN: 2:00 PM  CHIEF COMPLAINT:  IVC  HPI: Patient is a 53 y.o. M with history of schizophrenia, diabetes, alcoholic cirrhosis, hepatitis C and, esophageal varices, prior history of hepatic encephalopathy, CHF who presents to the emergency department with police after he was involuntarily committed by his sister and brother-in-law. Per IVC paperwork, sister and brother-in-law are concerned that the patient has been talking to himself, a rational, verbally abusive, not taking his medications and abusing alcohol. Police were taking the patient to [facility] for evaluation but given his history of cirrhosis they asked that he be brought to the emergency department. Patient denies that he is having any SI, HI or hallucinations. He states that he is taking his medications but that he does not get along with his sister and brother-in-law and that they have recently been fighting. He states he does drink one beer a day. He denies any chest pain or shortness of breath. States his lower extremity swelling is markedly improved from his last admission in February 2015. No new abdominal pain. No abdominal distention. No numbness, tingling or focal weakness. No hematemesis, bloody stools or melena. Patient reports his last drink alcohol yesterday. Denies any drug use.  ROS: See HPI Constitutional: no fever  Eyes: no drainage  ENT: no runny nose   Cardiovascular:  no chest pain  Resp: no SOB  GI: no vomiting GU: no dysuria Integumentary: no rash  Allergy: no hives  Musculoskeletal: no leg swelling  Neurological: no slurred speech ROS otherwise negative  PAST MEDICAL HISTORY/PAST SURGICAL HISTORY:  Past Medical History  Diagnosis Date  . Diabetes mellitus without complication   . Schizophrenia     onset age 5.  in and out of psych facilities from age 60 to age 16.  s/p electroconvulsive therapy.  . TB (tuberculosis)     sister says this dx'd at age 38 and treated  . Hepatic encephalopathy   .  Esophageal varices   . GI bleed   . Cirrhosis   . Thrombocytopenia   . Alcohol abuse   . Mild Coagulopathy 11/08/2013  . Hepatitis C 12/02/2013  . Smoking 09/23/2013    MEDICATIONS:  Prior to Admission medications   Medication Sig Start Date End Date Taking? Authorizing Provider  beclomethasone (QVAR) 80 MCG/ACT inhaler Inhale 2 puffs into the lungs 2 (two) times daily.     Historical Provider, MD  ENSURE (ENSURE) Take 1 Can by mouth 2 (two) times daily between meals. 01/18/14   Debbe Odea, MD  folic acid (FOLVITE) 1 MG tablet Take 1 tablet (1 mg total) by mouth daily. 12/02/13   Angelica Chessman, MD  furosemide (LASIX) 40 MG tablet Take 1 tablet (40 mg total) by mouth 2 (two) times daily. 12/02/13   Angelica Chessman, MD  lactulose (CHRONULAC) 10 GM/15ML solution Take 30 ML in the breakfast and 30 ml at lunch. 01/10/14   Amy S Esterwood, PA-C  ondansetron (ZOFRAN) 4 MG tablet Take 1 tablet (4 mg total) by mouth every 6 (six) hours as needed for nausea. 01/27/14   Amy S Esterwood, PA-C  oxyCODONE-acetaminophen (PERCOCET/ROXICET) 5-325 MG per tablet Take 1 tab only per day as needed for pain. 02/10/14   Amy S Esterwood, PA-C  pantoprazole (PROTONIX) 40 MG tablet Take 1 tablet (40 mg total) by mouth daily. 01/10/14   Amy S Esterwood, PA-C  rifaximin (XIFAXAN) 550 MG TABS tablet Take 1 tablet (550 mg total) by mouth 2 (two) times daily. 12/30/13   Thurnell Lose,  MD  spironolactone (ALDACTONE) 50 MG tablet Take 1 tab daily in the AM. 01/10/14   Amy S Esterwood, PA-C  thiamine (VITAMIN B-1) 100 MG tablet Take 100 mg by mouth daily.    Historical Provider, MD  traMADol (ULTRAM) 50 MG tablet Take 1 tablet (50 mg total) by mouth every 6 (six) hours as needed. 01/27/14   Amy S Esterwood, PA-C    ALLERGIES:  Allergies  Allergen Reactions  . Haldol [Haloperidol]     afib problems  . Oxycodone Rash    SOCIAL HISTORY:  History  Substance Use Topics  . Smoking status: Current Some Day Smoker -- 0.50  packs/day for 25 years    Types: Cigarettes  . Smokeless tobacco: Never Used     Comment: Tobacco information given 02/10/14  . Alcohol Use: Yes     Comment:  drink beer socially     FAMILY HISTORY: Family History  Problem Relation Age of Onset  . Hypertension Mother   . Colon cancer Neg Hx   . Lung cancer Mother     smoker  . Colon polyps Sister     twin sister    EXAM: BP 122/61  Pulse 84  Temp(Src) 99.8 F (37.7 C) (Oral)  Resp 18  SpO2 100% CONSTITUTIONAL: Alert and oriented x3 and responds appropriately to questions. Well-appearing; well-nourished, pleasant, cooperative HEAD: Normocephalic EYES: Conjunctivae clear, PERRL, no conjunctival pallor ENT: normal nose; no rhinorrhea; moist mucous membranes; pharynx without lesions noted NECK: Supple, no meningismus, no LAD  CARD: RRR; S1 and S2 appreciated; no murmurs, no clicks, no rubs, no gallops RESP: Normal chest excursion without splinting or tachypnea; breath sounds clear and equal bilaterally; no wheezes, no rhonchi, no rales, no respiratory distress or hypoxia ABD/GI: Normal bowel sounds; non-distended; soft, non-tender, no rebound, no guarding BACK:  The back appears normal and is non-tender to palpation, there is no CVA tenderness EXT: Normal ROM in all joints; non-tender to palpation; no edema; normal capillary refill; no cyanosis    SKIN: Normal color for age and race; warm NEURO: Moves all extremities equally, cranial nerves II through XII intact, sensation to light-touch intact diffusely, normal gait, no asterixis PSYCH: The patient's mood and manner are appropriate. Grooming and personal hygiene are appropriate.  MEDICAL DECISION MAKING: Patient here with police after he was IVC'ed by family for their concerns for psychosis, agitation. Patient denies any current medical complaints. His exam is benign. Will obtain screening labs and urine and consult psychiatry for evaluation.  ED PROGRESS: Patient's LFTs are  slightly improved from baseline. He has chronic anemia, thrombocytopenia.  No active bleeding. His INR is slightly elevated which is chronic for him and due to his cirrhosis.  Alcohol level and drug screen are negative. He has a glucose of 67. Will encourage by mouth intake. He is still oriented x3, neurologically intact with no medical complaints. No concern for hepatic encephalopathy currently. He is currently medically stable and awaiting psychiatry evaluation for further disposition.  4:00 PM  Pt's IVC has been rescinded by Dr. Lovena Le.  Psychiatry will discharge the patient home to stay with his friends.     [place], DO 02/19/14 [place], DO 02/19/14 [id number]

## 2014-02-19 NOTE — ED Notes (Signed)
Bed: WLPT3 Expected date:  Expected time:  Means of arrival:  Comments: IVC from [employer]

## 2014-02-19 NOTE — Discharge Instructions (Signed)
Finding Treatment for Alcohol and Drug Addiction  It can be hard to find the right place to get professional treatment. Here are some important things to consider:   There are different types of treatment to choose from.   Some programs are live-in (residential) while others are not (outpatient). Sometimes a combination is offered.   No single type of program is right for everyone.   Most treatment programs involve a combination of education, counseling, and a 12-step, spiritually-based approach.   There are non-spiritually based programs (not 12-step).   Some treatment programs are government sponsored. They are geared for patients without private insurance.   Treatment programs can vary in many respects such as:   Cost and types of insurance accepted.   Types of on-site medical services offered.   Length of stay, setting, and size.   Overall philosophy of treatment.  A person may need specialized treatment or have needs not addressed by all programs. For example, adolescents need treatment appropriate for their age. Other people have secondary disorders that must be managed as well. Secondary conditions can include mental illness, such as depression or diabetes. Often, a period of detoxification from alcohol or drugs is needed. This requires medical supervision and not all programs offer this.  THINGS TO CONSIDER WHEN SELECTING A TREATMENT PROGRAM    Is the program certified by the appropriate government agency? Even private programs must be certified and employ certified professionals.   Does the program accept your insurance? If not, can a payment plan be set up?   Is the facility clean, organized, and well run? Do they allow you to speak with graduates who can share their treatment experience with you? Can you tour the facility? Can you meet with staff?   Does the program meet the full range of individual needs?   Does the treatment program address sexual orientation and physical disabilities?  Do they provide age, gender, and culturally appropriate treatment services?   Is treatment available in languages other than English?   Is long-term aftercare support or guidance encouraged and provided?   Is assessment of an individual's treatment plan ongoing to ensure it meets changing needs?   Does the program use strategies to encourage reluctant patients to remain in treatment long enough to increase the likelihood of success?   Does the program offer counseling (individual or group) and other behavioral therapies?   Does the program offer medicine as part of the treatment regimen, if needed?   Is there ongoing monitoring of possible relapse? Is there a defined relapse prevention program? Are services or referrals offered to family members to ensure they understand addiction and the recovery process? This would help them support the recovering individual.   Are 12-step meetings held at the center or is transport available for patients to attend outside meetings?  In countries outside of the [place], see local directories for contact information for services in your area.  Document Released: 09/15/2005 Document Revised: 01/09/2012 Document Reviewed: 03/27/2008  Exit[employer] Patient Information 2014 [employer].

## 2014-02-25 NOTE — Consult Note (Signed)
Face to face evaluation and I agree with this note 

## 2014-02-27 ENCOUNTER — Encounter (INDEPENDENT_AMBULATORY_CARE_PROVIDER_SITE_OTHER): Payer: Self-pay | Admitting: Ophthalmology

## 2014-03-10 ENCOUNTER — Telehealth: Payer: Self-pay | Admitting: *Deleted

## 2014-03-10 NOTE — Telephone Encounter (Signed)
Message copied by Hulan Saas on Mon Mar 10, 2014  9:07 AM ------      Message from: Hulan Saas      Created: Tue Feb 11, 2014  3:46 PM       Call and remind patient due for CMET for AE on 03/14/14 ------

## 2014-03-10 NOTE — Telephone Encounter (Signed)
Patient does not live at cell number any longer per person that answered. Left a message at home number for patient to call me.

## 2014-03-11 NOTE — Telephone Encounter (Signed)
Spoke with patient's sister Trixie Rude and she will see that patient has labs done.

## 2014-03-13 ENCOUNTER — Telehealth: Payer: Self-pay | Admitting: Physician Assistant

## 2014-03-13 NOTE — Telephone Encounter (Signed)
I spoke to [name], the patient's sister. I told her Amy Esterwood PA-C declined refilling the Hydrocodone at this time. I reminded [name] that Amy told him at his last visit with her, that she would not keep refilling the pain medication.  She suggested he have an appointment with Dr. Ardis Hughs.  He does have one on 04-01-2014 at 9:00 am.  Amy suggested he get a PCP MD and [name] said he does have a PCP now but doesn't have an appointment.  I suggested per Amy that he makes an appointment with the PCP. [name] said the MD is at a [facility].  She thanked me for calling her back.

## 2014-04-01 ENCOUNTER — Encounter: Payer: Self-pay | Admitting: Gastroenterology

## 2014-04-01 ENCOUNTER — Other Ambulatory Visit (INDEPENDENT_AMBULATORY_CARE_PROVIDER_SITE_OTHER): Payer: Medicaid Other

## 2014-04-01 ENCOUNTER — Ambulatory Visit: Payer: Medicaid Other

## 2014-04-01 ENCOUNTER — Ambulatory Visit (INDEPENDENT_AMBULATORY_CARE_PROVIDER_SITE_OTHER): Payer: Medicaid Other | Admitting: Gastroenterology

## 2014-04-01 VITALS — BP 100/66 | HR 76 | Ht 68.8 in | Wt 190.6 lb

## 2014-04-01 DIAGNOSIS — K7460 Unspecified cirrhosis of liver: Secondary | ICD-10-CM

## 2014-04-01 DIAGNOSIS — B182 Chronic viral hepatitis C: Secondary | ICD-10-CM

## 2014-04-01 LAB — COMPREHENSIVE METABOLIC PANEL
ALBUMIN: 2.7 g/dL — AB (ref 3.5–5.2)
ALT: 26 U/L (ref 0–53)
AST: 49 U/L — AB (ref 0–37)
Alkaline Phosphatase: 113 U/L (ref 39–117)
BUN: 9 mg/dL (ref 6–23)
CO2: 23 mEq/L (ref 19–32)
Calcium: 8.7 mg/dL (ref 8.4–10.5)
Chloride: 104 mEq/L (ref 96–112)
Creatinine, Ser: 0.9 mg/dL (ref 0.4–1.5)
GFR: 94.02 mL/min (ref >60.00)
Glucose, Bld: 138 mg/dL — ABNORMAL HIGH (ref 70–99)
POTASSIUM: 4 meq/L (ref 3.5–5.1)
Sodium: 132 mEq/L — ABNORMAL LOW (ref 135–145)
TOTAL PROTEIN: 7 g/dL (ref 6.0–8.3)
Total Bilirubin: 2.4 mg/dL — ABNORMAL HIGH (ref 0.2–1.2)

## 2014-04-01 LAB — HEPATITIS B SURFACE ANTIBODY,QUALITATIVE: Hep B S Ab: NEGATIVE

## 2014-04-01 LAB — HEPATITIS B SURFACE ANTIGEN: Hepatitis B Surface Ag: NEGATIVE

## 2014-04-01 NOTE — Progress Notes (Signed)
Review of pertinent gastrointestinal problems: 1. Cirrhosis (etoh, hep C); previous alcoholic, still drinks occasionally (03/2014). Labs 10/2013 Hep B S Ag neg, Hep A IgM neg, Hep C Ab +, RNA quant 154K (12/2013 labs)  Also substance abuse (cocaine in blood 12/2013)  INR 1.6-2; low platelets, t bili 2-3 range   UNOS MELD score 01/2014 was 16). Hep C Ab +, RNA quant 154K (12/2013 labs)  Most recent EGD Ardis Hughs 10/2013 done while acutely ill, bleeding: Found 2 medium sized esophageal varices, banded (was before TIPS was placed).  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  HPI: This is a   pleasant 53 year old man who is here with his male friend.  Weight down 18 pounds in past month or so (our scale here in GI)  Takes lasix 40 MG twice daily, aldactone 50 once daily, lactulose one swig to two swigs per day.  Thinking pretty clearly.   Gets around pretty well.  cmet done today, results pending.  His low back is hurting and he asked for Percocet medicines.    Past Medical History  Diagnosis Date  . Diabetes mellitus without complication   . Schizophrenia     onset age 53.  in and out of psych facilities from age 25 to age 53.  s/p electroconvulsive therapy.  . TB (tuberculosis)     sister says this dx'd at age 3 and treated  . Hepatic encephalopathy   . Esophageal varices   . GI bleed   . Cirrhosis   . Thrombocytopenia   . Alcohol abuse   . Mild Coagulopathy 11/08/2013  . Hepatitis C 12/02/2013  . Smoking 09/23/2013    Past Surgical History  Procedure Laterality Date  . Cataract surgery  Bilateral   . Esophagogastroduodenoscopy N/A 11/08/2013    Procedure: ESOPHAGOGASTRODUODENOSCOPY (EGD);  Surgeon: Milus Banister, MD;  Location: [facility];  Service: Endoscopy;  Laterality: N/A;  bedside  . Radiology with anesthesia N/A 12/26/2013    Procedure: Transjugular Intrahepatic Portosystemic Shunt (TIPS);  Surgeon: Carylon Perches,  MD;  Location: [facility];  Service: Radiology;  Laterality: N/A;    Current Outpatient Prescriptions  Medication Sig Dispense Refill  . beclomethasone (QVAR) 80 MCG/ACT inhaler Inhale 2 puffs into the lungs 2 (two) times daily.       Marland Kitchen ENSURE (ENSURE) Take 1 Can by mouth 2 (two) times daily between meals.  119 mL  12  . folic acid (FOLVITE) 1 MG tablet Take 1 tablet (1 mg total) by mouth daily.  90 tablet  3  . furosemide (LASIX) 40 MG tablet Take 1 tablet (40 mg total) by mouth 2 (two) times daily.  30 tablet  0  . lactulose (CHRONULAC) 10 GM/15ML solution Take 20 g by mouth. Take 20 G (30 ml) at breakfast and take 20 G (30 ml) at lunch.      . ondansetron (ZOFRAN) 4 MG tablet Take 1 tablet (4 mg total) by mouth every 6 (six) hours as needed for nausea.  40 tablet  0  . oxyCODONE-acetaminophen (PERCOCET/ROXICET) 5-325 MG per tablet Take 1 tab only per day as needed for pain.  25 tablet  0  . pantoprazole (PROTONIX) 40 MG tablet Take 1 tablet (40 mg total) by mouth daily.  30 tablet  6  . rifaximin (XIFAXAN) 550 MG TABS tablet Take 1 tablet (550 mg total) by mouth 2 (two) times daily.  60 tablet  1  . spironolactone (  ALDACTONE) 50 MG tablet Take 50 mg by mouth every morning.      . thiamine (VITAMIN B-1) 100 MG tablet Take 100 mg by mouth daily.       No current facility-administered medications for this visit.    Allergies as of 04/01/2014 - Review Complete 04/01/2014  Allergen Reaction Noted  . Haldol [haloperidol]  11/21/2013  . Oxycodone Rash 01/18/2014    Family History  Problem Relation Age of Onset  . Hypertension Mother   . Colon cancer Neg Hx   . Lung cancer Mother     smoker  . Colon polyps Sister     twin sister    History   Social History  . Marital Status: Single    Spouse Name: N/A    Number of Children: 0  . Years of Education: N/A   Occupational History  . Not on file.   Social History Main Topics  . Smoking status: Current Some Day Smoker -- 0.50 packs/day  for 25 years    Types: Cigarettes  . Smokeless tobacco: Never Used     Comment: Tobacco information given 02/10/14  . Alcohol Use: Yes     Comment:  drink beer socially   . Drug Use: No     Comment: not currently  . Sexual Activity: Not on file   Other Topics Concern  . Not on file   Social History Narrative  . No narrative on file      Physical Exam: BP 100/66  Pulse 76  Ht 5' 8.8" (1.748 m)  Wt 190 lb 9.6 oz (86.456 kg)  BMI 28.30 kg/m2 Constitutional: generally well-appearing Psychiatric: alert and oriented x3 Abdomen: soft, nontender, nondistended, no obvious ascites, no peritoneal signs, normal bowel sounds     Assessment and plan: 53 y.o. male with cirrhosis from hepatitis C, remote alcoholism however he is continuing to drink.  He understands that he needs to absolutely completely Stop drinking. He had a very good response to the TIPS procedure which she had, his weight is overall down about 80 pounds. His fluid status is a very good based on physical exam he has no ascites or edema in his lower extremities. I am going to refer him to the liver clinic here in town to consider hepatitis C eradication.   Viral load was checked to 3 months ago while he was hospitalized that we do not have a genotype and I will check that today.  We will check his hepatitis B serologies and likely will begin immunization series pending those results. He will continue on his current diuretics. He will return to see me in 6 weeks and sooner if needed.  He will have alpha-fetoprotein level checked. He will return to see me in 6 weeks. He asked for narcotic pain medicines for his back and I declined. I recommended he talk with his primary care physician about that

## 2014-04-01 NOTE — Patient Instructions (Addendum)
Referral to [facility] liver clinic to consider hepatitis C treatments.  You will be contacted with appointment information.  Please call if you have not heard from our office or the [facility] clinic please call [phone]. You will have labs checked today in the basement lab.  Please head down after you check out with the front desk  (hepatitis B surface Antigen, Hepatitis B surface antibody, alpha feto protein).  Will probably start immunization for Hep A and B pending those lab results. You need to completely stop drinking alcohol. Please return to see Dr. Ardis Hughs in 6 weeks. It is important that you have a relatively low salt diet.  High salt diet can cause fluid to accumulate in your legs, abdomen and even around your lungs. You should try to avoid NSAID type over the counter pain medicines as best as possible. Tylenol is safe to take for 'routine' aches and pains, but never take more than 1/2 the dose suggested on the package instructions (never more than 2 grams per day).

## 2014-04-02 LAB — AFP TUMOR MARKER: AFP-Tumor Marker: 79.2 ng/mL — ABNORMAL HIGH (ref 0.0–8.0)

## 2014-04-04 LAB — HEPATITIS C RNA QUANTITATIVE

## 2014-04-04 LAB — HEPATITIS C GENOTYPE

## 2014-04-08 ENCOUNTER — Ambulatory Visit: Payer: Medicaid Other | Admitting: Internal Medicine

## 2014-04-18 ENCOUNTER — Telehealth: Payer: Self-pay | Admitting: Gastroenterology

## 2014-04-18 NOTE — Telephone Encounter (Addendum)
I have spoken with Dawn, several  messages have been left for the pt.  Have him call [phone] and  Leave a message with a good number I did call and leave him a message to call

## 2014-04-24 ENCOUNTER — Telehealth: Payer: Self-pay | Admitting: Gastroenterology

## 2014-04-24 NOTE — Telephone Encounter (Signed)
The pt has been contacted numerous times to set up Hep C appt, he has returned any of the calls.  I have also left messages with the phone number for him to contact the Hep C clinic.

## 2014-05-06 ENCOUNTER — Other Ambulatory Visit: Payer: Self-pay | Admitting: Physician Assistant

## 2014-06-03 ENCOUNTER — Ambulatory Visit: Payer: Medicaid Other | Admitting: Gastroenterology

## 2014-06-24 ENCOUNTER — Other Ambulatory Visit (HOSPITAL_COMMUNITY): Payer: Self-pay | Admitting: Diagnostic Radiology

## 2014-06-24 DIAGNOSIS — Z95828 Presence of other vascular implants and grafts: Secondary | ICD-10-CM

## 2014-07-03 ENCOUNTER — Encounter: Payer: Self-pay | Admitting: Radiology

## 2014-07-15 ENCOUNTER — Telehealth: Payer: Self-pay | Admitting: Internal Medicine

## 2014-07-15 NOTE — Telephone Encounter (Signed)
Pt is calling because he is having trouble finding a pharmacy that carries "Ensure PO LIQD". Please follow up with PT to advise upon what pharmacy might have these in stock.

## 2014-07-21 ENCOUNTER — Telehealth: Payer: Self-pay | Admitting: Emergency Medicine

## 2014-07-21 IMAGING — CT CT ABD-PELV W/ CM
1 of 3 series · 14 of 32 positions shown, 19 images · IV contrast (OMNIPAQUE 300)
Comparison: November 09, 2013

CLINICAL DATA: Abdomen pain and distention

EXAM:
CT ABDOMEN AND PELVIS WITH CONTRAST
TECHNIQUE: Multidetector CT imaging of the abdomen and pelvis was performed
using the standard protocol following bolus administration of
intravenous contrast.
CONTRAST:  100mL OMNIPAQUE IOHEXOL 300 MG/ML  SOLN

[Series 2: abd/pel with · axial · 0.85mm/px · z∈[+991,+1431]mm · 14 of 100 slices shown, 19 images]
[im 6/100  soft-tissue]
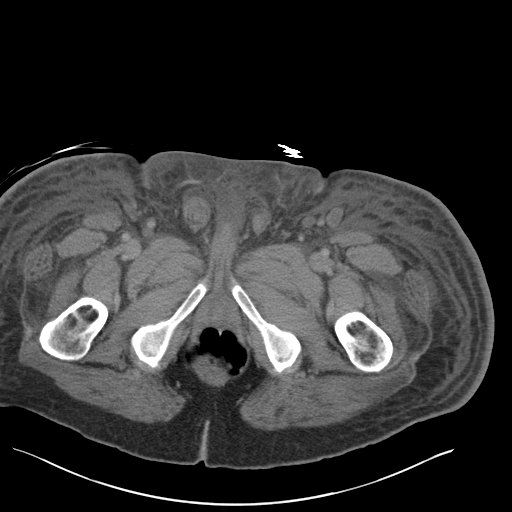
[im 6/100  bone]
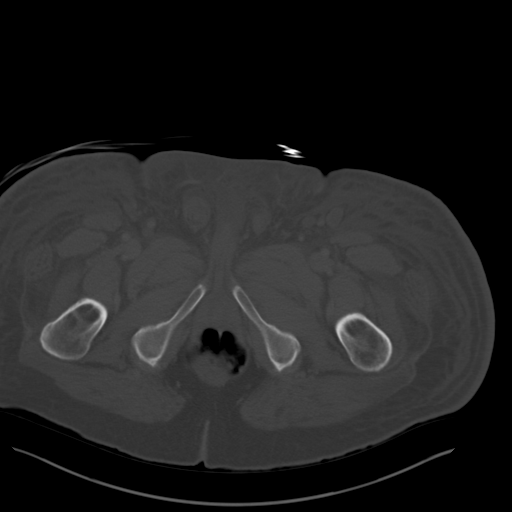
[im 16/100  soft-tissue]
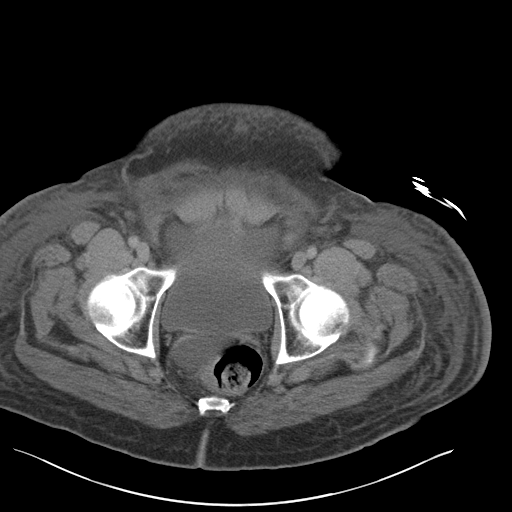
[im 21/100  soft-tissue]
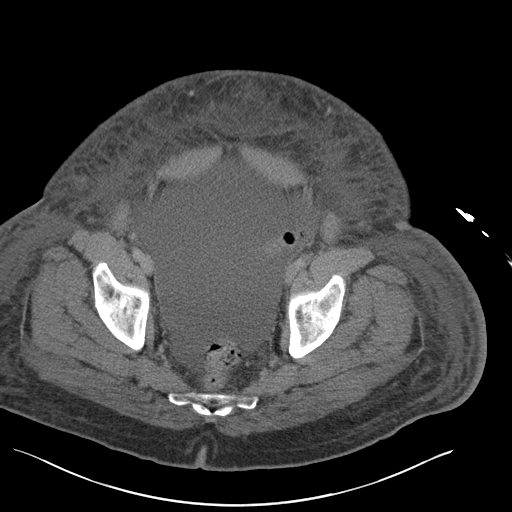
[im 27/100  soft-tissue]
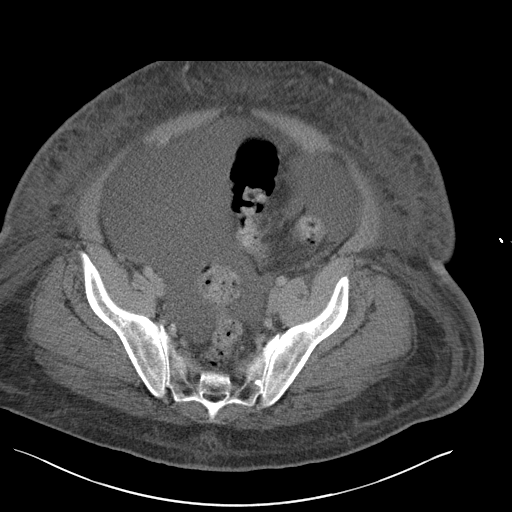
[im 37/100  soft-tissue]
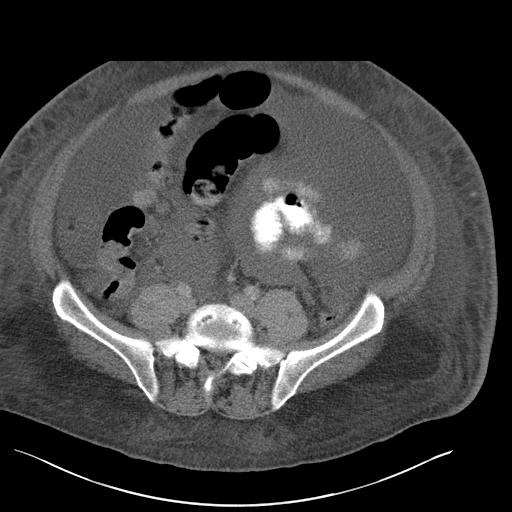
[im 42/100  soft-tissue]
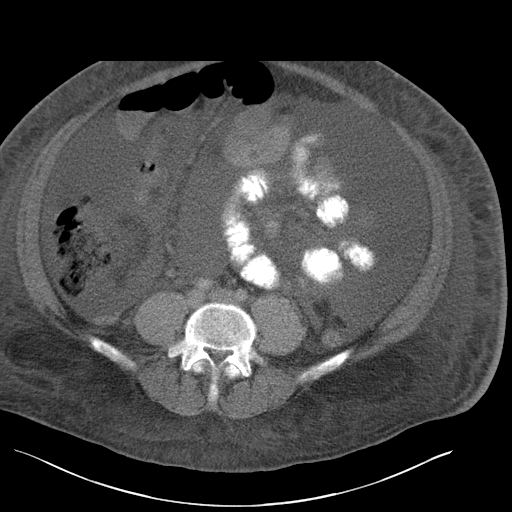
[im 53/100  soft-tissue]
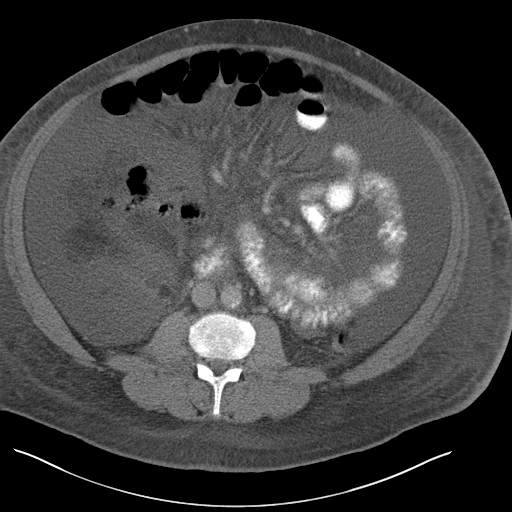
[im 58/100  soft-tissue]
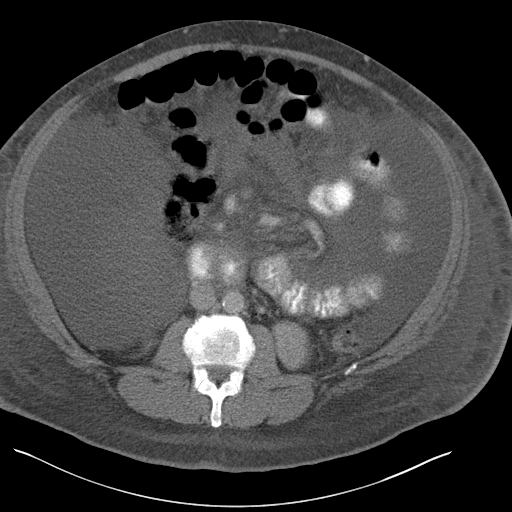
[im 63/100  soft-tissue]
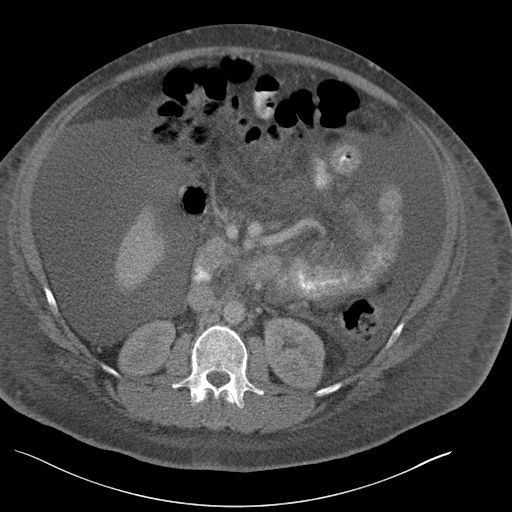
[im 63/100  bone]
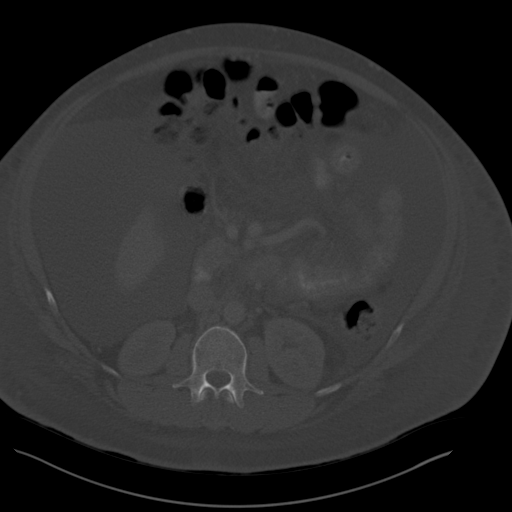
[im 73/100  soft-tissue]
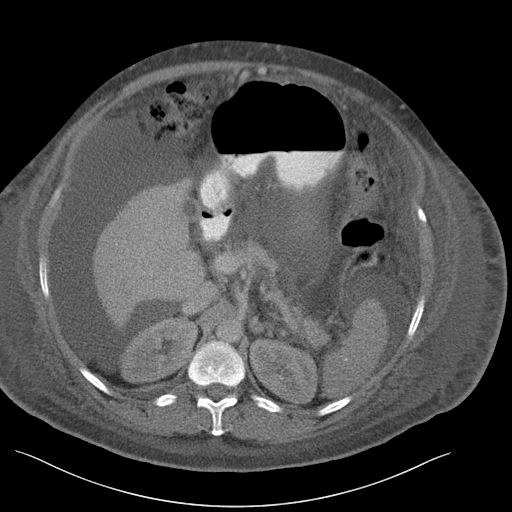
[im 79/100  soft-tissue]
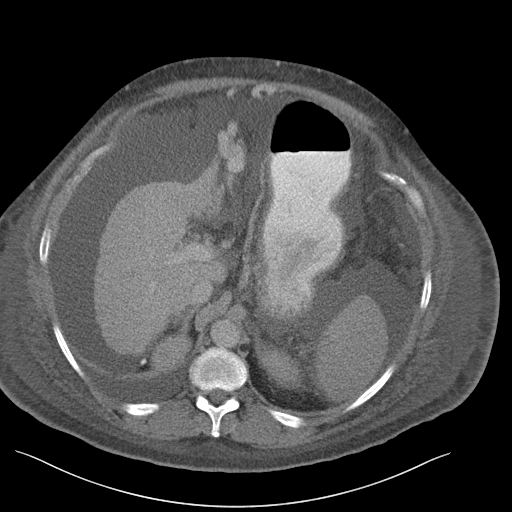
[im 79/100  lung]
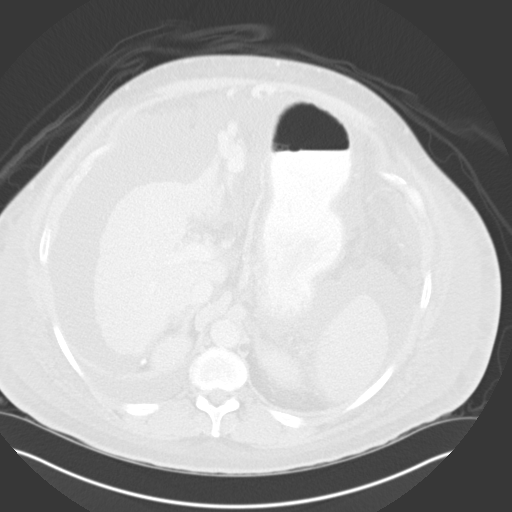
[im 84/100  soft-tissue]
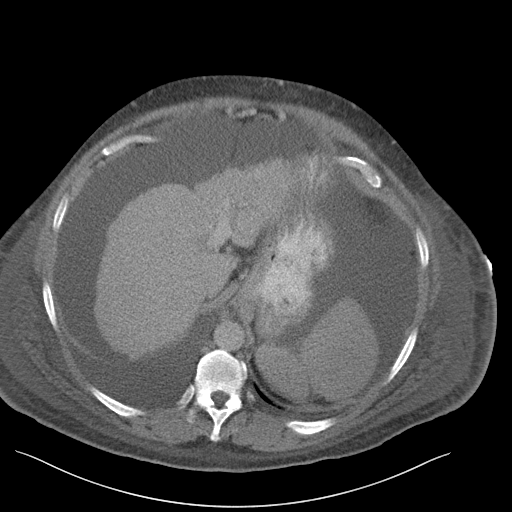
[im 84/100  lung]
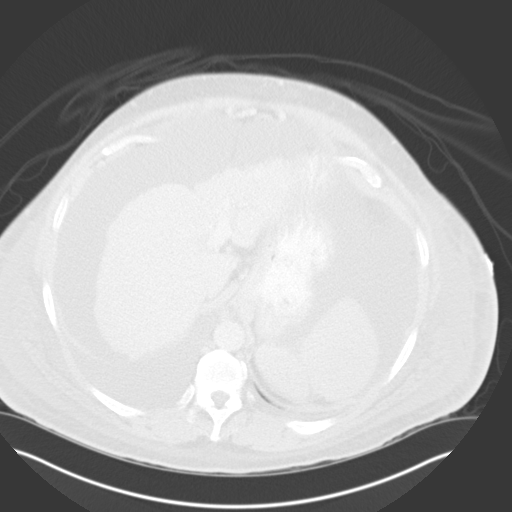
[im 89/100  lung]
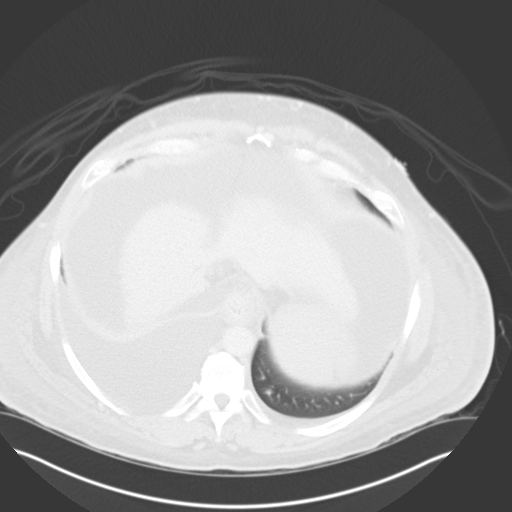
[im 94/100  soft-tissue]
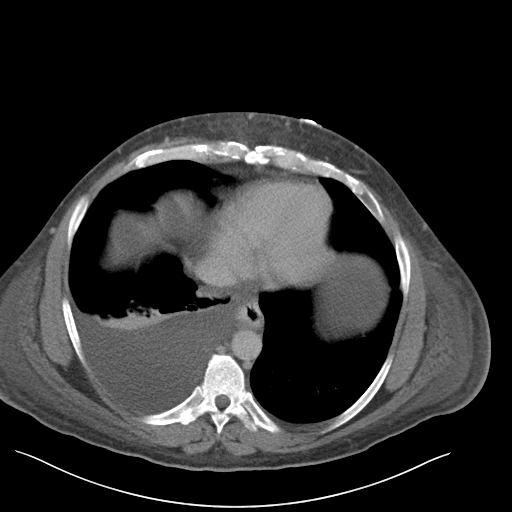
[im 94/100  lung]
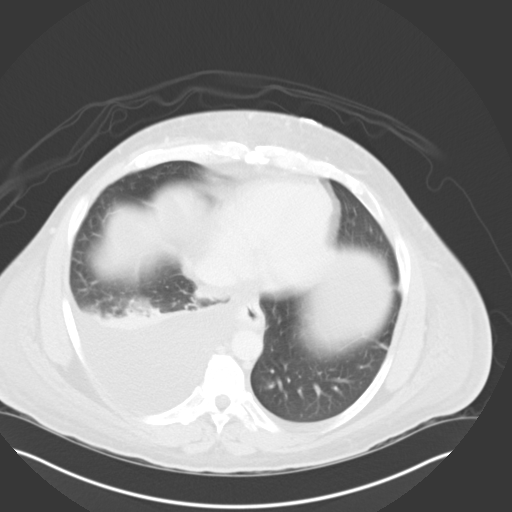

[14 of 32 positions shown; findings below may reference images not displayed]

FINDINGS: There is interval developed marked ascites in the abdomen and
pelvis. There is nodular contour of the liver consistent with known
cirrhosis of liver. Gallstones are noted in the gallbladder. Spleen
is stable. The pancreas adrenal glands and kidneys are normal. There
is no hydronephrosis bilaterally. There is mild atherosclerosis of
the abdominal aorta without aneurysmal dilatation. There is no small
bowel obstruction or diverticulitis. Fluid and stranding is
identified within the subcutaneous fat of the abdomen and pelvis.
There is moderate right pleural effusion with compression
atelectasis of adjacent posterior right lung base. The left lung is
clear. Degenerative joint changes of the spine are noted.
IMPRESSION: Interval developed marked ascites in the abdomen and pelvis.
Cirrhosis of liver. Cholelithiasis.

## 2014-07-21 NOTE — Telephone Encounter (Signed)
Pt states [facility] cost fr script Ensure 45 dollars for 12 Pt requesting cheaper way.

## 2014-07-29 ENCOUNTER — Other Ambulatory Visit: Payer: Medicaid Other

## 2014-07-29 ENCOUNTER — Inpatient Hospital Stay: Admission: RE | Admit: 2014-07-29 | Payer: Medicaid Other | Source: Ambulatory Visit

## 2014-07-30 ENCOUNTER — Other Ambulatory Visit: Payer: Self-pay | Admitting: Gastroenterology

## 2014-08-05 ENCOUNTER — Telehealth: Payer: Self-pay | Admitting: Gastroenterology

## 2014-08-05 NOTE — Telephone Encounter (Signed)
Appt made and a letter was mailed with the date and time, the pt is very difficult to reach by phone.

## 2014-08-05 NOTE — Telephone Encounter (Signed)
Oscar Swanson, He needs follow up appt with me, next available. Did not make his 6 week appt several months ago.

## 2014-08-13 ENCOUNTER — Ambulatory Visit
Admission: RE | Admit: 2014-08-13 | Discharge: 2014-08-13 | Disposition: A | Discharge status: Home / Self Care | Payer: Medicaid Other | Source: Ambulatory Visit | Attending: Diagnostic Radiology | Admitting: Diagnostic Radiology

## 2014-08-13 ENCOUNTER — Telehealth: Payer: Self-pay | Admitting: Gastroenterology

## 2014-08-13 DIAGNOSIS — Z95828 Presence of other vascular implants and grafts: Secondary | ICD-10-CM

## 2014-08-13 DIAGNOSIS — K769 Liver disease, unspecified: Secondary | ICD-10-CM

## 2014-08-13 DIAGNOSIS — R772 Abnormality of alphafetoprotein: Secondary | ICD-10-CM

## 2014-08-13 DIAGNOSIS — R935 Abnormal findings on diagnostic imaging of other abdominal regions, including retroperitoneum: Secondary | ICD-10-CM

## 2014-08-13 NOTE — Telephone Encounter (Signed)
08/15/14 145 pm appt with Dr Ardis Hughs  Need BUN CREAT STAT FOR MRI already ordered in EP[facility] You have been scheduled for an MRI at [facility] on 08/15/14. Your appointment time is 8:30 pm. Please arrive 30 minutes prior to your appointment time for registration purposes. Please make certain not to have anything to eat or drink 6 hours prior to your test. In addition, if you have any metal in your body, have a pacemaker or defibrillator, please be sure to let your ordering physician know. This test typically takes 45 minutes to 1 hour to complete.  The pt's sister has been notified.  The pt does not have a phone and the sister is the only contact.  She will have him at both appts as requested

## 2014-08-13 NOTE — Telephone Encounter (Signed)
Patty, I just spoke with radiology. They saw a suspicious liver lesion and let him know it may be cancer.   He needs rov with me next week, double book if needed. Also, can you set him up with MRI of liver (with IV contrast) for elevated AFP, liver lesion on [place].  Thanks

## 2014-08-13 NOTE — Consult Note (Signed)
.   Chief Complaint: Chief Complaint  Patient presents with  . Follow-up    23mo post TIPS    Referring Physician(s): Alea Ryer Ryan  History of Present Illness: Oscar Swanson is a 53 y.o. male with alcoholic cirrhosis and hepatitis C. The patient had a TIPS procedure on 12/26/2013. The procedure was performed for respiratory distress and recurrent ascites. Patient has not needed a paracentesis since I saw him last time. The patient denies respiratory problems. The patient does complain of persistent abdominal pain in the periumbilical and epigastric region. He has noticed intermittent bloody stools for the past 2 weeks but thinks this may be related to constipation and hemorrhoids. Patient has no signs or symptoms of hepatic encephalopathy. Patient has minimal lower extremity swelling. Patient continues to smoke one pack of cigarettes per day and drinks at least 2 beers per day.  He continues to take lactulose.   Past Medical History  Diagnosis Date  . Diabetes mellitus without complication   . Schizophrenia     onset age 81.  in and out of psych facilities from age 28 to age 54.  s/p electroconvulsive therapy.  . TB (tuberculosis)     sister says this dx'd at age 57 and treated  . Hepatic encephalopathy   . Esophageal varices   . GI bleed   . Cirrhosis   . Thrombocytopenia   . Alcohol abuse   . Mild Coagulopathy 11/08/2013  . Hepatitis C 12/02/2013  . Smoking 09/23/2013    Past Surgical History  Procedure Laterality Date  . Cataract surgery  Bilateral   . Esophagogastroduodenoscopy N/A 11/08/2013    Procedure: ESOPHAGOGASTRODUODENOSCOPY (EGD);  Surgeon: Milus Banister, MD;  Location: [place];  Service: Endoscopy;  Laterality: N/A;  bedside  . Radiology with anesthesia N/A 12/26/2013    Procedure: Transjugular Intrahepatic Portosystemic Shunt (TIPS);  Surgeon: Carylon Perches, MD;  Location: [place];  Service: Radiology;  Laterality: N/A;    Allergies: Haldol and  Oxycodone  Medications: Prior to Admission medications   Medication Sig Start Date End Date Taking? Authorizing Provider  beclomethasone (QVAR) 80 MCG/ACT inhaler Inhale 2 puffs into the lungs 2 (two) times daily.     Historical Provider, MD  ENSURE (ENSURE) Take 1 Can by mouth 2 (two) times daily between meals. 01/18/14   Debbe Odea, MD  folic acid (FOLVITE) 1 MG tablet Take 1 tablet (1 mg total) by mouth daily. 12/02/13   Tresa Garter, MD  furosemide (LASIX) 40 MG tablet Take 1 tablet (40 mg total) by mouth 2 (two) times daily. 12/02/13   Tresa Garter, MD  lactulose (CHRONULAC) 10 GM/15ML solution Take 20 g by mouth. Take 20 G (30 ml) at breakfast and take 20 G (30 ml) at lunch.    Historical Provider, MD  ondansetron (ZOFRAN) 4 MG tablet Take 1 tablet (4 mg total) by mouth every 6 (six) hours as needed for nausea. 01/27/14   Amy S Esterwood, PA-C  oxyCODONE-acetaminophen (PERCOCET/ROXICET) 5-325 MG per tablet Take 1 tab only per day as needed for pain. 02/10/14   Amy S Esterwood, PA-C  pantoprazole (PROTONIX) 40 MG tablet Take 1 tablet (40 mg total) by mouth daily. 01/10/14   Amy S Esterwood, PA-C  rifaximin (XIFAXAN) 550 MG TABS tablet Take 1 tablet (550 mg total) by mouth 2 (two) times daily. 12/30/13   Thurnell Lose, MD  spironolactone (ALDACTONE) 50 MG tablet Take 50 mg by mouth every morning.    Historical  Provider, MD  spironolactone (ALDACTONE) 50 MG tablet TAKE 1 TABLET BY MOUTH EVERY MORNING 07/31/14   Milus Banister, MD  thiamine (VITAMIN B-1) 100 MG tablet Take 100 mg by mouth daily.    Historical Provider, MD    Family History  Problem Relation Age of Onset  . Hypertension Mother   . Colon cancer Neg Hx   . Lung cancer Mother     smoker  . Colon polyps Sister     twin sister    History   Social History  . Marital Status: Single    Spouse Name: N/A    Number of Children: 0  . Years of Education: N/A   Social History Main Topics  . Smoking status: Current  Some Day Smoker -- 0.50 packs/day for 25 years    Types: Cigarettes  . Smokeless tobacco: Never Used     Comment: Tobacco information given 02/10/14  . Alcohol Use: Yes     Comment:  drink beer socially   . Drug Use: No     Comment: not currently  . Sexual Activity: Not on file   Other Topics Concern  . Not on file   Social History Narrative  . No narrative on file    Review of Systems  Constitutional: Negative.   Respiratory: Negative.   Cardiovascular: Positive for leg swelling.  Gastrointestinal: Positive for abdominal pain, constipation and blood in stool.    Vital Signs: BP 128/57  Pulse 67  Temp(Src) 98.2 F (36.8 C) (Oral)  Resp 18  SpO2 100%  Physical Exam  Cardiovascular: Normal rate, regular rhythm and normal heart sounds.   Pulmonary/Chest: Effort normal. He has wheezes.  Abdominal: Soft. Bowel sounds are normal. There is tenderness.    Imaging: [place] Art/ven Flow Abd Pelv Doppler  08/13/2014   CLINICAL DATA:  53 year old with history of alcoholic cirrhosis, hepatitis-C and status post TIPS procedure. TIPS procedure was performed on 12/26/2013. Patient presents for routine TIPS follow-up.  EXAM: DUPLEX ULTRASOUND OF LIVER  TECHNIQUE: Color and duplex Doppler ultrasound was performed to evaluate the hepatic in-flow and out-flow vessels.  COMPARISON:  Liver Doppler 01/28/2014  FINDINGS: Portal Vein Velocities  Main:  39 cm/sec  Right:  96 cm/sec  Left:  16 cm/sec  Hepatic Vein Velocities  Right:  17 cm/sec  Middle:  105 cm/sec  Left:  17 cm/sec  Hepatic Artery Velocity:  245 cm/sec  Splenic Vein Velocity:  47 cm/sec  TIPS  Portal:  96 cm/sec  Mid:  111 cm/sec  Hepatic:  105 cm/sec  Varices: None  Ascites: None  The liver is nodular and cirrhotic. There is a heterogeneous nodular structure along the posterior right hepatic lobe. This area measures 4.1 x 2.7 x 3.5 cm. Normal hepatopetal flow in the main portal vein. Again noted is hepatofugal flow in the left portal vein.  The TIPS stent is widely patent. No significant velocity changes in the TIPS from the prior examination. Hepatic veins are patent with hepatofugal flow. Spleen measures 10.9 cm in length.  IMPRESSION: The TIPS stent is patent without complicating features.  No evidence for ascites or varices.  Suspicious 4.1 cm lesion in the right hepatic lobe. Based on the history of cirrhosis and hepatitis-C, this is concerning for a hepatocellular carcinoma. This lesion could be better characterized with a liver MRI. This liver lesion was discussed with the patient's GI doctor, Dr. Ardis Hughs.   Electronically Signed   By: Markus Daft M.D.   On: 08/13/2014  10:06    Labs:  CBC:  Recent Labs  01/10/14 1113 01/27/14 1045 02/10/14 1114 02/19/14 1444  WBC 4.2* 3.9* 3.0* 3.2*  HGB 9.7* 11.1* 11.4* 10.7*  HCT 30.2* 34.0* 34.1* 32.4*  PLT 106.0* 93.0* 81.0* 58*    COAGS:  Recent Labs  11/08/13 1230 12/10/13 2045  12/30/13 0620 01/10/14 1113 01/27/14 1045 02/19/14 1444  INR 1.66* 1.90*  < > 1.80* 1.9* 1.8* 1.67*  APTT 31 37  --   --   --   --   --   < > = values in this interval not displayed.  BMP:  Recent Labs  12/28/13 0551 12/29/13 0605 12/30/13 0620  01/27/14 1045 02/10/14 1114 02/19/14 1444 04/01/14 0845  NA 132* 136* 132*  < > 133* 135  135 135* 132*  K 4.3 4.4 4.2  < > 4.0 3.9  3.9 3.8 4.0  CL 99 103 100  < > 106 108  108 104 104  CO2 25 24 24   < > 21 23  23 23 23   GLUCOSE 98 94 95  < > 107* 80  80 67* 138*  BUN 7 6 7   < > 11 10  10 9 9   CALCIUM 8.0* 8.2* 8.1*  < > 8.5 8.3*  8.3* 8.8 8.7  CREATININE 0.69 0.66 0.65  < > 0.7 0.7  0.7 0.73 0.9  GFRNONAA >90 >90 >90  --   --   --  >90  --   GFRAA >90 >90 >90  --   --   --  >90  --   < > = values in this interval not displayed.  LIVER FUNCTION TESTS:  Recent Labs  01/10/14 1113 02/10/14 1114 02/19/14 1444 04/01/14 0845  BILITOT 1.5* 3.2* 2.6* 2.4*  AST 108* 248* 96* 49*  ALT 49 127* 57* 26  ALKPHOS 106 156* 168* 113   PROT 6.7 7.3 7.6 7.0  ALBUMIN 2.4* 2.4* 2.3* 2.7*    TUMOR MARKERS:  Recent Labs  12/23/13 0450 04/01/14 0942  AFPTM 7.6 79.2*    Assessment and Plan:  1) Post TIPS procedure. The stent is widely patent and he has not required additional paracentesis procedures and no evidence for ascites on today's ultrasound. No evidence for hepatic encephalopathy. Will continue to follow the TIPS with ultrasound and plan for followup ultrasound in 6 months.  2)  Suspicious 4.1 cm lesion in the right hepatic lobe. Based on the history of hepatitis C and cirrhosis and elevated AFP marker, this is highly concerning for hepatocellular carcinoma. Findings were discussed with the patient's GI doctor, Dr. Ardis Hughs. I recommend obtaining a MRI of the liver to better characterize this lesion. If this is a hepatocellular carcinoma, the patient is likely not going to be a surgical candidate. Lesion may be amenable to thermal ablation or transarterial embolization. Will await results of MRI.     I spent a total of 15 minutes face to face in clinical consultation, greater than 50% of which was counseling/coordinating care for the TIPS and liver lesion.  Signed: Carylon Perches 08/13/2014, 10:54 AM

## 2014-08-13 NOTE — Progress Notes (Signed)
States rectal bleeding x2 weeks ,upper and lower abd pain,  memory ok Denies fever, chills, sweats, n&v

## 2014-08-15 ENCOUNTER — Encounter: Payer: Self-pay | Admitting: Gastroenterology

## 2014-08-15 ENCOUNTER — Other Ambulatory Visit (INDEPENDENT_AMBULATORY_CARE_PROVIDER_SITE_OTHER): Payer: Medicaid Other

## 2014-08-15 ENCOUNTER — Ambulatory Visit
Admission: RE | Admit: 2014-08-15 | Discharge: 2014-08-15 | Disposition: A | Discharge status: Home / Self Care | Payer: Medicaid Other | Source: Ambulatory Visit | Attending: Gastroenterology | Admitting: Gastroenterology

## 2014-08-15 ENCOUNTER — Ambulatory Visit (INDEPENDENT_AMBULATORY_CARE_PROVIDER_SITE_OTHER): Payer: Medicaid Other | Admitting: Gastroenterology

## 2014-08-15 VITALS — BP 120/70 | HR 73 | Ht 68.5 in | Wt 226.6 lb

## 2014-08-15 DIAGNOSIS — R772 Abnormality of alphafetoprotein: Secondary | ICD-10-CM

## 2014-08-15 DIAGNOSIS — K7030 Alcoholic cirrhosis of liver without ascites: Secondary | ICD-10-CM

## 2014-08-15 DIAGNOSIS — K7290 Hepatic failure, unspecified without coma: Secondary | ICD-10-CM

## 2014-08-15 DIAGNOSIS — R935 Abnormal findings on diagnostic imaging of other abdominal regions, including retroperitoneum: Secondary | ICD-10-CM

## 2014-08-15 DIAGNOSIS — K769 Liver disease, unspecified: Secondary | ICD-10-CM

## 2014-08-15 DIAGNOSIS — K7689 Other specified diseases of liver: Secondary | ICD-10-CM

## 2014-08-15 LAB — COMPREHENSIVE METABOLIC PANEL
ALT: 25 U/L (ref 0–53)
AST: 56 U/L — ABNORMAL HIGH (ref 0–37)
Albumin: 2.4 g/dL — ABNORMAL LOW (ref 3.5–5.2)
Alkaline Phosphatase: 119 U/L — ABNORMAL HIGH (ref 39–117)
BUN: 8 mg/dL (ref 6–23)
CALCIUM: 8.8 mg/dL (ref 8.4–10.5)
CHLORIDE: 108 meq/L (ref 96–112)
CO2: 21 mEq/L (ref 19–32)
Creatinine, Ser: 0.8 mg/dL (ref 0.4–1.5)
GFR: 115.88 mL/min (ref >60.00)
Glucose, Bld: 91 mg/dL (ref 70–99)
Potassium: 4 mEq/L (ref 3.5–5.1)
SODIUM: 132 meq/L — AB (ref 135–145)
TOTAL PROTEIN: 7.1 g/dL (ref 6.0–8.3)
Total Bilirubin: 2.5 mg/dL — ABNORMAL HIGH (ref 0.2–1.2)

## 2014-08-15 LAB — BASIC METABOLIC PANEL
BUN: 8 mg/dL (ref 6–23)
CO2: 21 meq/L (ref 19–32)
CREATININE: 0.8 mg/dL (ref 0.4–1.5)
Calcium: 8.8 mg/dL (ref 8.4–10.5)
Chloride: 108 mEq/L (ref 96–112)
GFR: 115.88 mL/min (ref >60.00)
Glucose, Bld: 91 mg/dL (ref 70–99)
Potassium: 4 mEq/L (ref 3.5–5.1)
Sodium: 132 mEq/L — ABNORMAL LOW (ref 135–145)

## 2014-08-15 LAB — PROTIME-INR
INR: 1.5 ratio — AB (ref 0.8–1.0)
Prothrombin Time: 16.3 s — ABNORMAL HIGH (ref 9.6–13.1)

## 2014-08-15 LAB — CBC WITH DIFFERENTIAL/PLATELET
BASOS ABS: 0 10*3/uL (ref 0.0–0.1)
Basophils Relative: 0.6 % (ref 0.0–3.0)
Eosinophils Absolute: 0.1 10*3/uL (ref 0.0–0.7)
Eosinophils Relative: 2.1 % (ref 0.0–5.0)
HEMATOCRIT: 36.6 % — AB (ref 39.0–52.0)
HEMOGLOBIN: 12.1 g/dL — AB (ref 13.0–17.0)
LYMPHS PCT: 28.1 % (ref 12.0–46.0)
Lymphs Abs: 1.3 10*3/uL (ref 0.7–4.0)
MCHC: 33 g/dL (ref 30.0–36.0)
MCV: 93.6 fl (ref 78.0–100.0)
MONOS PCT: 12.7 % — AB (ref 3.0–12.0)
Monocytes Absolute: 0.6 10*3/uL (ref 0.1–1.0)
NEUTROS ABS: 2.6 10*3/uL (ref 1.4–7.7)
Neutrophils Relative %: 56.5 % (ref 43.0–77.0)
PLATELETS: 66 10*3/uL — AB (ref 150.0–400.0)
RBC: 3.91 Mil/uL — ABNORMAL LOW (ref 4.22–5.81)
RDW: 18.1 % — AB (ref 11.5–15.5)
WBC: 4.5 10*3/uL (ref 4.0–10.5)

## 2014-08-15 LAB — BUN: BUN: 8 mg/dL (ref 6–23)

## 2014-08-15 LAB — CREATININE, SERUM: CREATININE: 0.8 mg/dL (ref 0.4–1.5)

## 2014-08-15 MED ORDER — PANTOPRAZOLE SODIUM 40 MG PO TBEC: 40.0000 mg | DELAYED_RELEASE_TABLET | Freq: Every day | ORAL | Status: DC

## 2014-08-15 MED ORDER — RIFAXIMIN 550 MG PO TABS: 550.0000 mg | ORAL_TABLET | Freq: Two times a day (BID) | ORAL | Status: AC

## 2014-08-15 MED ORDER — LACTULOSE 10 GM/15ML PO SOLN: 20.0000 g | Freq: Two times a day (BID) | ORAL | Status: DC

## 2014-08-15 MED ORDER — FUROSEMIDE 40 MG PO TABS: 40.0000 mg | ORAL_TABLET | Freq: Two times a day (BID) | ORAL | Status: DC

## 2014-08-15 MED ORDER — SPIRONOLACTONE 100 MG PO TABS: 100.0000 mg | ORAL_TABLET | Freq: Two times a day (BID) | ORAL | Status: DC

## 2014-08-15 NOTE — Progress Notes (Signed)
Review of pertinent gastrointestinal problems:  1. Cirrhosis (etoh, hep C); previous alcoholic, still drinks occasionally (03/2014). Labs 10/2013 Hep B S Ag neg, Hep B S Ab neg, Hep A IgM neg, Hep C Ab +, RNA quant 154K (12/2013 labs), Hep C genotype ordered but not done  Also substance abuse (cocaine in urine 12/2013) INR 1.6-2; low platelets, t bili 2-3 range  UNOS MELD score 01/2014 was 16). Hep C Ab +, RNA quant 154K (12/2013 labs)  Most recent EGD Ardis Hughs 10/2013 done while acutely ill, bleeding: Found 2 medium sized esophageal varices, banded (was before TIPS was placed). 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 Most recent imaging CT scan 12/2013 showed cirrhosis, ascites, no focal liver lesions. Most recent AFP 12/2013 normal   HPI: This is a very pleasant 53 year old man whom I last saw several months ago. He did not followup as was recommended.  She recently had TIPS check and it suggested patent TIPS but also a 4 cm mass in his liver that is new.   Weight is up 36 pounds since visit 6 months ago.  Drinks 1-2 beers daily.     Past Medical History  Diagnosis Date  . Diabetes mellitus without complication   . Schizophrenia     onset age 40.  in and out of psych facilities from age 34 to age 28.  s/p electroconvulsive therapy.  . TB (tuberculosis)     sister says this dx'd at age 58 and treated  . Hepatic encephalopathy   . Esophageal varices   . GI bleed   . Cirrhosis   . Thrombocytopenia   . Alcohol abuse   . Mild Coagulopathy 11/08/2013  . Hepatitis C 12/02/2013  . Smoking 09/23/2013    Past Surgical History  Procedure Laterality Date  . Cataract surgery  Bilateral   . Esophagogastroduodenoscopy N/A 11/08/2013    Procedure: ESOPHAGOGASTRODUODENOSCOPY (EGD);  Surgeon: Milus Banister, MD;  Location: [place];  Service: Endoscopy;  Laterality: N/A;  bedside  . Radiology with anesthesia N/A 12/26/2013    Procedure:  Transjugular Intrahepatic Portosystemic Shunt (TIPS);  Surgeon: Carylon Perches, MD;  Location: [place];  Service: Radiology;  Laterality: N/A;    Current Outpatient Prescriptions  Medication Sig Dispense Refill  . beclomethasone (QVAR) 80 MCG/ACT inhaler Inhale 2 puffs into the lungs 2 (two) times daily.       Marland Kitchen ENSURE (ENSURE) Take 1 Can by mouth 2 (two) times daily between meals.  947 mL  12  . folic acid (FOLVITE) 1 MG tablet Take 1 tablet (1 mg total) by mouth daily.  90 tablet  3  . furosemide (LASIX) 40 MG tablet Take 1 tablet (40 mg total) by mouth 2 (two) times daily.  30 tablet  0  . lactulose (CHRONULAC) 10 GM/15ML solution Take 20 g by mouth. Take 20 G (30 ml) at breakfast and take 20 G (30 ml) at lunch.      . ondansetron (ZOFRAN) 4 MG tablet Take 1 tablet (4 mg total) by mouth every 6 (six) hours as needed for nausea.  40 tablet  0  . oxyCODONE-acetaminophen (PERCOCET/ROXICET) 5-325 MG per tablet Take 1 tab only per day as needed for pain.  25 tablet  0  . pantoprazole (PROTONIX) 40 MG tablet Take 1 tablet (40 mg total) by mouth daily.  30 tablet  6  . rifaximin (XIFAXAN) 550 MG TABS tablet Take 1 tablet (550 mg total)  by mouth 2 (two) times daily.  60 tablet  1  . spironolactone (ALDACTONE) 50 MG tablet Take 50 mg by mouth every morning.      Marland Kitchen spironolactone (ALDACTONE) 50 MG tablet TAKE 1 TABLET BY MOUTH EVERY MORNING  30 tablet  1  . thiamine (VITAMIN B-1) 100 MG tablet Take 100 mg by mouth daily.       No current facility-administered medications for this visit.    Allergies as of 08/15/2014 - Review Complete 08/15/2014  Allergen Reaction Noted  . Haldol [haloperidol]  11/21/2013  . Oxycodone Rash 01/18/2014    Family History  Problem Relation Age of Onset  . Hypertension Mother   . Colon cancer Neg Hx   . Lung cancer Mother     smoker  . Colon polyps Sister     twin sister    History   Social History  . Marital Status: Single    Spouse Name: N/A    Number of  Children: 0  . Years of Education: N/A   Occupational History  . Not on file.   Social History Main Topics  . Smoking status: Current Some Day Smoker -- 0.50 packs/day for 25 years    Types: Cigarettes  . Smokeless tobacco: Never Used     Comment: Tobacco information given 02/10/14  . Alcohol Use: Yes     Comment:  drink beer socially   . Drug Use: No     Comment: not currently  . Sexual Activity: Not on file   Other Topics Concern  . Not on file   Social History Narrative  . No narrative on file      Physical Exam: BP 120/70  Pulse 73  Ht 5' 8.5" (1.74 m)  Wt 226 lb 9.6 oz (102.785 kg)  BMI 33.95 kg/m2  SpO2 98% Constitutional:  chronically ill-appearing  Psychiatric: alert and oriented x3 Abdomen: soft, nontender, nondistended, + obvious ascites, no peritoneal signs, normal bowel sounds No lower extremity edema    Assessment and plan: 53 y.o. male with cirrhosis, hep C and alcoholism, ongoing alcohol drinking, new lesion in liver with elevated AFP from several months ago  He is going to get an MRI later today. He will have a repeat set of labs checked today including CBC, complete metabolic profile, coags, alpha-fetoprotein. I will likely be adjusting his diuretic doses upward since she is planning on fluid weighted again. He understands that he may have a cancer in his liver.  I explained to him very clearly that he needs to completely stop drinking and that he should also stop smoking. He knows to avoid excessive salt as well. I be in contact with his lab results and MRI results and I will be seeing him back in 5-6 weeks and sooner if needed.

## 2014-08-15 NOTE — Patient Instructions (Addendum)
You will have labs checked today in the basement lab.  Please head down after you check out with the front desk  (cbc, cmet, inr, Hepatitis C genotype). You need to completely, absolutely stop drinking alcohol, permanently. Will refill your liver, GI meds today.  Lasix dose will be 40mg  one pill twice daily. Aldactone dose will be 100mg , one pill twice daily. Labs in 2 weeks which will be August 29, 2014 (bmet) to adjust water pill doses. Lab is open 7:30 am to 5:30 pm. It is important that you have a relatively low salt diet.  High salt diet can cause fluid to accumulate in your legs, abdomen and even around your lungs. You should try to avoid NSAID type over the counter pain medicines as best as possible. Tylenol is safe to take for 'routine' aches and pains, but never take more than 1/2 the dose suggested on the package instructions (never more than 2 grams per day). Will discuss your MRI results when they are back. Please make a follow up visit with Dr. Ardis Hughs for 6-7 weeks

## 2014-08-16 LAB — AFP TUMOR MARKER: AFP-Tumor Marker: 1043.7 ng/mL — ABNORMAL HIGH (ref <6.1)

## 2014-08-18 ENCOUNTER — Other Ambulatory Visit: Payer: Self-pay | Admitting: Gastroenterology

## 2014-08-18 ENCOUNTER — Other Ambulatory Visit: Payer: Self-pay

## 2014-08-18 DIAGNOSIS — C229 Malignant neoplasm of liver, not specified as primary or secondary: Secondary | ICD-10-CM

## 2014-08-20 LAB — HEPATITIS C RNA QUANTITATIVE
HCV Quantitative Log: 2.52 {Log} — ABNORMAL HIGH (ref <1.18)
HCV Quantitative: 328 IU/mL — ABNORMAL HIGH (ref <15)

## 2014-08-21 LAB — HEPATITIS C GENOTYPE

## 2014-08-26 ENCOUNTER — Telehealth: Payer: Self-pay | Admitting: Gastroenterology

## 2014-08-26 MED ORDER — ALPRAZOLAM 0.5 MG PO TABS: 0.5000 mg | ORAL_TABLET | ORAL | Status: DC

## 2014-08-26 NOTE — Telephone Encounter (Signed)
Per Cecille Rubin 2 xanax were sent to the pharmacy to take prior to MRI

## 2014-08-27 ENCOUNTER — Telehealth: Payer: Self-pay

## 2014-08-27 ENCOUNTER — Ambulatory Visit
Admission: RE | Admit: 2014-08-27 | Discharge: 2014-08-27 | Disposition: A | Discharge status: Home / Self Care | Payer: Medicaid Other | Source: Ambulatory Visit | Attending: Gastroenterology | Admitting: Gastroenterology

## 2014-08-27 DIAGNOSIS — C229 Malignant neoplasm of liver, not specified as primary or secondary: Secondary | ICD-10-CM

## 2014-08-27 DIAGNOSIS — C220 Liver cell carcinoma: Secondary | ICD-10-CM | POA: Insufficient documentation

## 2014-08-27 MED ORDER — GADOXETATE DISODIUM 0.25 MMOL/ML IV SOLN
9.0000 mL | Freq: Once | INTRAVENOUS | Status: AC | PRN
  Administered 2014-08-27: 9 mL via INTRAVENOUS

## 2014-08-27 NOTE — Telephone Encounter (Signed)
Oscar Swanson pt with h/o alcoholic cirrhosis. Call report received on MRI. As doc of the day please advise.  Show images for MR Abdomen W Wo Contrast         Study Result    CLINICAL DATA: 53 year old male with history of cirrhosis from  alcohol and hepatitis-C status post DIP S are not 12/26/2013. Recent  ultrasound examination demonstrated a suspicious lesion in the right  hepatic lobe, concerning for potential hepatocellular carcinoma.  EXAM:  MRI ABDOMEN WITHOUT AND WITH CONTRAST  TECHNIQUE:  Multiplanar multisequence MR imaging of the abdomen was performed  both before and after the administration of intravenous contrast.  CONTRAST: 9 mL of Eovist  COMPARISON: Abdominal ultrasound 08/13/2014. CT of the abdomen and  pelvis 12/10/2013.  FINDINGS:  The lesion of concern is in segment 7 of the liver adjacent to the  liver capsule, and measures 4.9 x 4.3 x 3.4 cm with macrolobulated  margins. This lesion is heterogeneous in signal intensity on pre  gadolinium T1 weighted images (predominantly isointense to slightly  hyperintense), heterogeneous least slightly T2 hyperintense,  demonstrates internal areas of diffusion restriction, and  demonstrates heterogeneous hypervascular enhancement on arterial  phase imaging with subsequent washout at 20 minutes, diagnostic of  hepatocellular carcinoma. This lesion does not appear to extend into  the hepatic veins. This lesion is separate from the portal veins. No  other suspicious hepatic lesions are noted at this time. However, in  segment 3 of the liver there is a 1.9 cm lesion that is T1  hypointense, T2 hyperintense, which demonstrates peripheral nodular  enhancement with progressive centripetal filling, compatible with a  cavernous hemangioma. The liver has a shrunken appearance and  nodular contour, compatible with underlying cirrhosis. There is a  lace-like pattern of T2 hyperintensity throughout the hepatic  parenchyma on T2 weighted  images, compatible with developing hepatic  fibrosis. The portal vein is mildly dilated measuring 16 mm in the  porta hepatis. Susceptibility artifact is noted extending from the  right portal vein to the middle hepatic vein, presumably the  reported TIPS. Numerous portosystemic collateral pathways are noted,  including a recannulized paraumbilical vein.  Heterogeneous high T1 signal intensity is noted dependently in the  gallbladder, corresponding several filling defects on T2 weighted  images, compatible with small gallstones and/or biliary sludge. No  current findings to suggest an acute cholecystitis at this time. The  spleen is enlarged measuring 12.2 x 8.2 x 15.0 cm (estimated splenic  volume of 750 mL). The appearance of the pancreas, bilateral adrenal  glands and bilateral kidneys is unremarkable. Trace amount of T2  hyperintensity throughout root of the small bowel mesenteries may  suggest a trace volume of ascites or mesenteric edema.  IMPRESSION:  1. Stigmata of hepatic cirrhosis and hepatic fibrosis with 4.9 x 4.3  x 3.4 cm hypervascular lesion in segment 7 of the liver which has  imaging characteristics in the setting of cirrhosis that are  diagnostic of hepatocellular carcinoma. If not already obtained,  multidisciplinary (surgical, oncological and interventional  radiology) consultation is recommended. References: Bruix Maury Dus; American Association for the Study of Liver Diseases. Management  of hepatocellular carcinoma: an update. Hepatology  2011;53:1020-1022, and [place] for Liver Transplant  Allocation: Standardization of Liver Imaging, Diagnosis,  Classification, and Reporting of Hepatocellular Carcinoma 1.  Radiology: Volume 266: Number 2-February 2013).  2. 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  3. Stigmata of portal hypertension,  with multiple portosystemic  collateral pathways and splenomegaly, as above. A  TIPS is noted, and  appears patent.  4. Cholelithiasis and/or biliary sludge in the gallbladder. No  current findings to suggest an acute cholecystitis at this time.  These results will be called to the ordering clinician or  representative by the Radiologist Assistant, and communication  documented in the PACS or zVision Dashboard.  Electronically Signed  By: Vinnie Langton M.D.  On: 08/27/2014 13:37

## 2014-08-27 NOTE — Telephone Encounter (Signed)
It appears that they were evaluating a suspicious lesion from a prior radiology study. Indeed, may be a liver tumor. Nothing urgent with this however. This can be addressed by Dr. Ardis Hughs when he returns. At the patient know that he does have a suspicious lesion in the liver and that Dr. Ardis Hughs will make additional recommendations when he returns. Fax

## 2014-08-27 NOTE — Telephone Encounter (Signed)
Pt aware and knows to expect a call next week from Dr. Ardis Hughs.

## 2014-09-02 ENCOUNTER — Other Ambulatory Visit: Payer: Self-pay

## 2014-09-02 ENCOUNTER — Telehealth: Payer: Self-pay | Admitting: Emergency Medicine

## 2014-09-02 DIAGNOSIS — C229 Malignant neoplasm of liver, not specified as primary or secondary: Secondary | ICD-10-CM

## 2014-09-02 NOTE — Telephone Encounter (Signed)
LMOVM TO MAKE PT AWARE THAT APPT W/ DR HENN WILL BE ON 09-16-14 AT 10:45AM, PLEASE CALL BACK TO CONFIRM APPT.

## 2014-09-02 NOTE — Progress Notes (Signed)
09/16/14 1045 am arrival per Tammy she will contact the pt

## 2014-09-08 ENCOUNTER — Telehealth: Payer: Self-pay | Admitting: Gastroenterology

## 2014-09-08 NOTE — Telephone Encounter (Signed)
The pt's sister has been given the appt for the liver clinic 09/29/14 230 pm Charlie Pitter MD  [phone]

## 2014-09-09 ENCOUNTER — Telehealth: Payer: Self-pay | Admitting: Hematology

## 2014-09-09 NOTE — Telephone Encounter (Signed)
S/W PATIENT SISTER AND GAVE NP APPT FOR 11/18 @ 10:30 W/DR. FENG REFERRING DR. DAN JACOBS DX- MALIGNANT NEOPLASM OF LIVER WELCOME PACKET MAILED.

## 2014-09-10 ENCOUNTER — Ambulatory Visit: Payer: Medicaid Other | Admitting: Hematology

## 2014-09-10 ENCOUNTER — Ambulatory Visit: Payer: Medicaid Other

## 2014-09-16 ENCOUNTER — Encounter: Payer: Self-pay | Admitting: Gastroenterology

## 2014-09-16 ENCOUNTER — Other Ambulatory Visit (INDEPENDENT_AMBULATORY_CARE_PROVIDER_SITE_OTHER): Payer: Medicaid Other

## 2014-09-16 ENCOUNTER — Ambulatory Visit (INDEPENDENT_AMBULATORY_CARE_PROVIDER_SITE_OTHER): Payer: Medicaid Other | Admitting: Gastroenterology

## 2014-09-16 ENCOUNTER — Inpatient Hospital Stay: Admission: RE | Admit: 2014-09-16 | Payer: Medicaid Other | Source: Ambulatory Visit

## 2014-09-16 ENCOUNTER — Other Ambulatory Visit: Payer: Medicaid Other

## 2014-09-16 VITALS — BP 118/64 | HR 68 | Ht 68.5 in | Wt 226.4 lb

## 2014-09-16 DIAGNOSIS — C229 Malignant neoplasm of liver, not specified as primary or secondary: Secondary | ICD-10-CM

## 2014-09-16 MED ORDER — ENSURE PO LIQD: 1.0000 | Freq: Two times a day (BID) | ORAL | Status: AC

## 2014-09-16 MED ORDER — ENSURE PO LIQD: 1.0000 | Freq: Two times a day (BID) | ORAL | Status: DC

## 2014-09-16 NOTE — Progress Notes (Signed)
Review of pertinent gastrointestinal problems:  1. Cirrhosis (etoh, hep C); previous alcoholic, still drinks occasionally (03/2014). Labs 10/2013 Hep B S Ag neg, Hep B S Ab neg, Hep A IgM neg, Hep C Ab +, RNA quant 154K (12/2013 labs), Hep C genotype ordered but not done   Also substance abuse (cocaine in urine 12/2013)  INR 1.6-2; low platelets, t bili 2-3 range   UNOS MELD score 01/2014 was 16). Hep C Ab +, RNA quant 154K (12/2013 labs)   Most recent EGD Ardis Hughs 10/2013 done while acutely ill, bleeding: Found 2 medium sized esophageal varices, banded (was before TIPS was placed).  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  Most recent imaging CT scan 12/2013 showed cirrhosis, ascites, no focal liver lesions.   AFP 12/2013 normal; AFP June 2015 75; AFP October 2015 1,000 2. HEPATOMA diagnosed by MRI (4.9cm , segment 7) and AFP 07/2014     HPI: This is a   Very pleasant  53 year old man whom I last saw about one month ago. He has hepatitis C, alcohol related cirrhosis and a hepatoma 4.9 cm recently diagnosed.  He stopped drinking completely 2-3 weeks ago. Still smokes cigarettes periodically.  He is set to meet with medical oncology tomorrow, interventional radiology next week, we will check about his evaluation with [facility] liver clinic appointment.  He feels overall stable. His diuretics have been recently adjusted upwards that he did not have follow-up basic metabolic profile which we will get for him today.    Past Medical History  Diagnosis Date  . Diabetes mellitus without complication   . Schizophrenia     onset age 27.  in and out of psych facilities from age 35 to age 39.  s/p electroconvulsive therapy.  . TB (tuberculosis)     sister says this dx'd at age 23 and treated  . Hepatic encephalopathy   . Esophageal varices   . GI bleed   . Cirrhosis   . Thrombocytopenia   . Alcohol abuse   . Mild  Coagulopathy 11/08/2013  . Hepatitis C 12/02/2013  . Smoking 09/23/2013    Past Surgical History  Procedure Laterality Date  . Cataract surgery  Bilateral   . Esophagogastroduodenoscopy N/A 11/08/2013    Procedure: ESOPHAGOGASTRODUODENOSCOPY (EGD);  Surgeon: Milus Banister, MD;  Location: [place];  Service: Endoscopy;  Laterality: N/A;  bedside  . Radiology with anesthesia N/A 12/26/2013    Procedure: Transjugular Intrahepatic Portosystemic Shunt (TIPS);  Surgeon: Carylon Perches, MD;  Location: [place];  Service: Radiology;  Laterality: N/A;    Current Outpatient Prescriptions  Medication Sig Dispense Refill  . ALPRAZolam (XANAX) 0.5 MG tablet Take 1 tablet (0.5 mg total) by mouth as directed. Take 1 hour prior to MRI and repeat in 30 minutes if needed 2 tablet 0  . beclomethasone (QVAR) 80 MCG/ACT inhaler Inhale 2 puffs into the lungs 2 (two) times daily.     Marland Kitchen ENSURE (ENSURE) Take 1 Can by mouth 2 (two) times daily between meals. 824 mL 12  . folic acid (FOLVITE) 1 MG tablet Take 1 tablet (1 mg total) by mouth daily. 90 tablet 3  . furosemide (LASIX) 40 MG tablet Take 1 tablet (40 mg total) by mouth 2 (two) times daily. 30 tablet 6  . lactulose (CHRONULAC) 10 GM/15ML solution Take 30 mLs (20 g total) by mouth 2 (two) times daily. Take 20 G (30 ml) at breakfast and  take 20 G (30 ml) at lunch. 240 mL 3  . ondansetron (ZOFRAN) 4 MG tablet Take 1 tablet (4 mg total) by mouth every 6 (six) hours as needed for nausea. 40 tablet 0  . oxyCODONE-acetaminophen (PERCOCET/ROXICET) 5-325 MG per tablet Take 1 tab only per day as needed for pain. 25 tablet 0  . pantoprazole (PROTONIX) 40 MG tablet Take 1 tablet (40 mg total) by mouth daily. 30 tablet 6  . rifaximin (XIFAXAN) 550 MG TABS tablet Take 1 tablet (550 mg total) by mouth 2 (two) times daily. 60 tablet 6  . spironolactone (ALDACTONE) 100 MG tablet Take 1 tablet (100 mg total) by mouth 2 (two) times daily. 60 tablet 6  . thiamine (VITAMIN B-1) 100  MG tablet Take 100 mg by mouth daily.     No current facility-administered medications for this visit.    Allergies as of 09/16/2014 - Review Complete 09/16/2014  Allergen Reaction Noted  . Haldol [haloperidol]  11/21/2013  . Oxycodone Rash 01/18/2014    Family History  Problem Relation Age of Onset  . Hypertension Mother   . Colon cancer Neg Hx   . Lung cancer Mother     smoker  . Colon polyps Sister     twin sister    History   Social History  . Marital Status: Single    Spouse Name: N/A    Number of Children: 0  . Years of Education: N/A   Occupational History  . Not on file.   Social History Main Topics  . Smoking status: Current Some Day Smoker -- 0.50 packs/day for 25 years    Types: Cigarettes  . Smokeless tobacco: Never Used     Comment: Tobacco information given 02/10/14  . Alcohol Use: Yes     Comment:  drink beer socially   . Drug Use: No     Comment: not currently  . Sexual Activity: Not on file   Other Topics Concern  . Not on file   Social History Narrative      Physical Exam: BP 118/64 mmHg  Pulse 68  Ht 5' 8.5" (1.74 m)  Wt 226 lb 6 oz (102.683 kg)  BMI 33.92 kg/m2 Constitutional: generally well-appearing Psychiatric: alert and oriented x3 Abdomen: soft, nontender, nondistended, no obvious ascites, no peritoneal signs, normal bowel sounds  no lower extremity edema    Assessment and plan: 53 y.o. male with  Hep C EtOH cirrhosis, hepatoma  I think for social reasons he will unlikely be a good candidate for liver transplantation evaluation. He will still be meeting with [facility] liver clinic in the near future for that evaluation. He is meeting with medical oncology and interventional radiology the next 1-2 weeks to treat his newly diagnosed hepatoma. He will return to see me in 3 months and he will have basic metabolic profile checked today.

## 2014-09-16 NOTE — Patient Instructions (Addendum)
We will make sure he is set up with [facility] Hum[facility]bird at [facility] liver clinic please call [phone], IR(done), medical oncologist (done). Continue not drinking alcohol. Please return to see Dr. Ardis Hughs on 12/17/14 9 am. Your physician has requested that you go to the basement for the following lab work before leaving today: BMET

## 2014-09-17 ENCOUNTER — Encounter: Payer: Self-pay | Admitting: Hematology

## 2014-09-17 ENCOUNTER — Ambulatory Visit (HOSPITAL_BASED_OUTPATIENT_CLINIC_OR_DEPARTMENT_OTHER): Payer: Medicaid Other | Admitting: Hematology

## 2014-09-17 ENCOUNTER — Telehealth: Payer: Self-pay | Admitting: Hematology

## 2014-09-17 ENCOUNTER — Ambulatory Visit: Payer: Medicaid Other

## 2014-09-17 ENCOUNTER — Ambulatory Visit (HOSPITAL_BASED_OUTPATIENT_CLINIC_OR_DEPARTMENT_OTHER): Payer: Medicaid Other

## 2014-09-17 VITALS — BP 123/58 | HR 65 | Temp 98.2°F | Resp 18 | Ht 68.5 in | Wt 223.3 lb

## 2014-09-17 DIAGNOSIS — F1099 Alcohol use, unspecified with unspecified alcohol-induced disorder: Secondary | ICD-10-CM

## 2014-09-17 DIAGNOSIS — R160 Hepatomegaly, not elsewhere classified: Secondary | ICD-10-CM

## 2014-09-17 DIAGNOSIS — K766 Portal hypertension: Secondary | ICD-10-CM

## 2014-09-17 DIAGNOSIS — R188 Other ascites: Secondary | ICD-10-CM

## 2014-09-17 DIAGNOSIS — K7460 Unspecified cirrhosis of liver: Secondary | ICD-10-CM

## 2014-09-17 DIAGNOSIS — C220 Liver cell carcinoma: Secondary | ICD-10-CM

## 2014-09-17 DIAGNOSIS — B1920 Unspecified viral hepatitis C without hepatic coma: Secondary | ICD-10-CM

## 2014-09-17 LAB — BASIC METABOLIC PANEL
BUN: 14 mg/dL (ref 6–23)
CHLORIDE: 107 meq/L (ref 96–112)
CO2: 24 meq/L (ref 19–32)
Calcium: 9 mg/dL (ref 8.4–10.5)
Creatinine, Ser: 0.9 mg/dL (ref 0.4–1.5)
GFR: 95.08 mL/min (ref >60.00)
GLUCOSE: 68 mg/dL — AB (ref 70–99)
Potassium: 4.2 mEq/L (ref 3.5–5.1)
Sodium: 135 mEq/L (ref 135–145)

## 2014-09-17 LAB — CBC & DIFF AND RETIC
BASO%: 0.7 % (ref 0.0–2.0)
Basophils Absolute: 0 10*3/uL (ref 0.0–0.1)
EOS%: 2.9 % (ref 0.0–7.0)
Eosinophils Absolute: 0.1 10*3/uL (ref 0.0–0.5)
HEMATOCRIT: 38.2 % — AB (ref 38.4–49.9)
HGB: 12.8 g/dL — ABNORMAL LOW (ref 13.0–17.1)
Immature Retic Fract: 3.9 % (ref 3.00–10.60)
LYMPH%: 28.5 % (ref 14.0–49.0)
MCH: 30.8 pg (ref 27.2–33.4)
MCHC: 33.5 g/dL (ref 32.0–36.0)
MCV: 91.8 fL (ref 79.3–98.0)
MONO#: 0.8 10*3/uL (ref 0.1–0.9)
MONO%: 17.5 % — ABNORMAL HIGH (ref 0.0–14.0)
NEUT#: 2.3 10*3/uL (ref 1.5–6.5)
NEUT%: 50.4 % (ref 39.0–75.0)
PLATELETS: 54 10*3/uL — AB (ref 140–400)
RBC: 4.16 10*6/uL — ABNORMAL LOW (ref 4.20–5.82)
RDW: 17.6 % — ABNORMAL HIGH (ref 11.0–14.6)
Retic %: 1.46 % (ref 0.80–1.80)
Retic Ct Abs: 60.74 10*3/uL (ref 34.80–93.90)
WBC: 4.5 10*3/uL (ref 4.0–10.3)
lymph#: 1.3 10*3/uL (ref 0.9–3.3)

## 2014-09-17 LAB — PROTHROMBIN TIME
INR: 1.51 — ABNORMAL HIGH (ref <1.50)
Prothrombin Time: 18.2 seconds — ABNORMAL HIGH (ref 11.6–15.2)

## 2014-09-17 NOTE — Telephone Encounter (Signed)
Gave avs & cal for Nov/Dec. °

## 2014-09-17 NOTE — Progress Notes (Signed)
Checked in new patient with no fianicial issues prior to seeing the dr. He has appt card.

## 2014-09-17 NOTE — Progress Notes (Addendum)
[name] NOTE  Patient Care Team: Tresa Garter, MD as PCP - General (Internal Medicine)  CHIEF COMPLAINTS/PURPOSE OF CONSULTATION:  Newly diagnosed [place]    [place] (hepatocellular carcinoma)   08/27/2014 Initial Diagnosis HCC (hepatocellular carcinoma)   08/27/2014 Imaging liver MRI W/WO iv contrast showed liver cirrhosis and a 4.9cm mass in right lobe liver, seg 7, with typical image characteristics of [place]. also found a 1.9cm hemangioma in segment 3 of liver.  AFP was 1044 on 08/15/14.      HISTORY OF PRESENTING ILLNESS:  Oscar Swanson is a 53 y.o. male is being referred by Dr. Ardis Hughs to discuss further management of his newly diagnosed [place].   He was admitted to [facility] in January 2015 for Mental Status Change, Upper GI Bleeding and Ascites. He Was found to have Hep C, liver cirrhosis which complicated with upper GI bleeding, hepatic encephalopathy and ascites. He had TIP placed at that time and has been seen gastroenterologist Dr. Ardis Hughs for management of ascites and liver cirrhosis. His fluid overload significantly improved after diuretics and he lost about 100 pounds. He had not been treated for Hep C.   His AFP was normal in February 2014. This was periodically monitor, and it went up to 78 in June 2015. Repeated AFP on 08/15/2014 was significant late elevated at 1044. This prompted a imaging study.  Liver MRI on 08/27/2014 showed liver cirrhosis and 4.9 cm mass in segment 7 of right liver, with typical arterial enhancement and venous washout. The MRI also reviewed a incidental 1.9 cm hemangioma in segment history of liver. No significant lymphadenopathy was found in the MRI.  He lives in a group home, able to function independently. 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 His ppetie is good, weight is stable lately.  He has moderate fatigue, but is able to do all his daily activity independently. He has history of alcohol abuse, and he  has cut down to 2 beers per week in the past year.    MEDICAL HISTORY:  Past Medical History  Diagnosis Date  . Diabetes mellitus without complication   . Schizophrenia     onset age 31.  in and out of psych facilities from age 43 to age 38.  s/p electroconvulsive therapy.  . TB (tuberculosis)     sister says this dx'd at age 80 and treated  . Hepatic encephalopathy   . Esophageal varices   . GI bleed   . Cirrhosis   . Thrombocytopenia   . Alcohol abuse   . Mild Coagulopathy 11/08/2013  . Hepatitis C 12/02/2013  . Smoking 09/23/2013    SURGICAL HISTORY: Past Surgical History  Procedure Laterality Date  . Cataract surgery  Bilateral   . Esophagogastroduodenoscopy N/A 11/08/2013    Procedure: ESOPHAGOGASTRODUODENOSCOPY (EGD);  Surgeon: Milus Banister, MD;  Location: [place];  Service: Endoscopy;  Laterality: N/A;  bedside  . Radiology with anesthesia N/A 12/26/2013    Procedure: Transjugular Intrahepatic Portosystemic Shunt (TIPS);  Surgeon: Carylon Perches, MD;  Location: [place];  Service: Radiology;  Laterality: N/A;    SOCIAL HISTORY: History   Social History  . Marital Status: Single    Spouse Name: N/A    Number of Children: 0  . Years of Education: N/A   Occupational History  . Not on file.   Social History Main Topics  . Smoking status: Current Some Day Smoker -- 0.50 packs/day for 25 years  Types: Cigarettes  . Smokeless tobacco: Never Used     Comment: Tobacco information given 02/10/14  . Alcohol Use: Yes     Comment:  drink beer once a week, used to drink heavily since 16, cut down since one year ago   . Drug Use:  No     Comment: not currently  . Sexual Activity: Not on file     FAMILY HISTORY: Family History  Problem Relation Age of Onset  . Hypertension Mother   . Colon cancer Nephew    . Lung cancer?  Mother     smoker  . Colon polyps Sister     twin sister  No family history of liver cancer or other malignancy.  ALLERGIES:  is allergic to  haldol and oxycodone.  MEDICATIONS:  Current Outpatient Prescriptions  Medication Sig Dispense Refill  . ALPRAZolam (XANAX) 0.5 MG tablet Take 1 tablet (0.5 mg total) by mouth as directed. Take 1 hour prior to MRI and repeat in 30 minutes if needed 2 tablet 0  . beclomethasone (QVAR) 80 MCG/ACT inhaler Inhale 2 puffs into the lungs 2 (two) times daily.     Marland Kitchen ENSURE (ENSURE) Take 1 Can by mouth 2 (two) times daily between meals. 191 mL 12  . folic acid (FOLVITE) 1 MG tablet Take 1 tablet (1 mg total) by mouth daily. 90 tablet 3  . furosemide (LASIX) 40 MG tablet Take 1 tablet (40 mg total) by mouth 2 (two) times daily. 30 tablet 6  . lactulose (CHRONULAC) 10 GM/15ML solution Take 30 mLs (20 g total) by mouth 2 (two) times daily. Take 20 G (30 ml) at breakfast and take 20 G (30 ml) at lunch. 240 mL 3  . ondansetron (ZOFRAN) 4 MG tablet Take 1 tablet (4 mg total) by mouth every 6 (six) hours as needed for nausea. 40 tablet 0  . oxyCODONE-acetaminophen (PERCOCET/ROXICET) 5-325 MG per tablet Take 1 tab only per day as needed for pain. 25 tablet 0  . pantoprazole (PROTONIX) 40 MG tablet Take 1 tablet (40 mg total) by mouth daily. 30 tablet 6  . rifaximin (XIFAXAN) 550 MG TABS tablet Take 1 tablet (550 mg total) by mouth 2 (two) times daily. 60 tablet 6  . spironolactone (ALDACTONE) 100 MG tablet Take 1 tablet (100 mg total) by mouth 2 (two) times daily. 60 tablet 6  . thiamine (VITAMIN B-1) 100 MG tablet Take 100 mg by mouth daily.     No current facility-administered medications for this visit.    REVIEW OF SYSTEMS:   Constitutional: Denies fevers, chills or abnormal night sweats, (+) fatigue  Eyes: Denies blurriness of vision, double vision or watery eyes Ears, nose, mouth, throat, and face: Denies mucositis or sore throat Respiratory: Denies cough, dyspnea or wheezes Cardiovascular: Denies palpitation, chest discomfort or lower extremity swelling Gastrointestinal:  Denies nausea, heartburn or  change in bowel habits. (+) bdominal distention Skin: Denies abnormal skin rashes Lymphatics: Denies new lymphadenopathy or easy bruising Neurological:Denies numbness, tingling or new weaknesses Behavioral/Psych: Mood is stable, no new changes  All other systems were reviewed with the patient and are negative.  PHYSICAL EXAMINATION: ECOG PERFORMANCE STATUS: 1 - Symptomatic but completely ambulatory  Filed Vitals:   09/17/14 1016  BP: 123/58  Pulse: 65  Temp: 98.2 F (36.8 C)  Resp: 18   Filed Weights   09/17/14 1016  Weight: 223 lb 5 oz (101.294 kg)    GENERAL:alert, no distress and comfortable SKIN: skin color, texture, turgor  are normal, no rashes or significant lesions EYES: normal, conjunctiva are pink and non-injected, sclera clear OROPHARYNX:no exudate, no erythema and lips, buccal mucosa, and tongue normal  NECK: supple, thyroid normal size, non-tender, without nodularity LYMPH:  no palpable lymphadenopathy in the cervical, axillary or inguinal LUNGS: clear to auscultation and percussion with normal breathing effort HEART: regular rate & rhythm and no murmurs and no lower extremity edema ABDOMEN:abdomen soft, non-tender and normal bowel sounds Musculoskeletal:no cyanosis of digits and no clubbing  PSYCH: alert & oriented x 3 with fluent speech NEURO: no focal motor/sensory deficits  LABORATORY DATA:  I have reviewed the data as listed Lab Results  Component Value Date   WBC 4.5 08/15/2014   HGB 12.1* 08/15/2014   HCT 36.6* 08/15/2014   MCV 93.6 08/15/2014   PLT 66.0* 08/15/2014    Recent Labs  12/23/13 0450  12/29/13 0605 12/30/13 0620  02/19/14 1444 04/01/14 0845 08/15/14 1428 09/16/14 1555  NA 128*  < > 136* 132*  < > 135* 132* 132*  132* 135  K 4.1  < > 4.4 4.2  < > 3.8 4.0 4.0  4.0 4.2  CL 95*  < > 103 100  < > 104 104 108  108 107  CO2 24  < > 24 24  < > 23 23 21  21 24   GLUCOSE 83  < > 94 95  < > 67* 138* 91  91 68*  BUN 13  < > 6 7  < >  9 9 8  8  8 14   CREATININE 0.86  < > 0.66 0.65  < > 0.73 0.9 0.8  0.8  0.8 0.9  CALCIUM 8.1*  < > 8.2* 8.1*  < > 8.8 8.7 8.8  8.8 9.0  GFRNONAA >90  < > >90 >90  --  >90  --   --   --   GFRAA >90  < > >90 >90  --  >90  --   --   --   PROT 5.8*  < > 5.9* 5.9*  < > 7.6 7.0 7.1  --   ALBUMIN 1.8*  < > 1.9* 1.8*  < > 2.3* 2.7* 2.4*  --   AST 66*  < > 48* 57*  < > 96* 49* 56*  --   ALT 36  < > 26 30  < > 57* 26 25  --   ALKPHOS 107  < > 102 106  < > 168* 113 119*  --   BILITOT 1.1  < > 1.0 0.9  < > 2.6* 2.4* 2.5*  --   BILIDIR 0.5*  --   --   --   --   --   --   --   --   IBILI 0.6  --   --   --   --   --   --   --   --   < > = values in this interval not displayed.  RADIOGRAPHIC STUDIES: I have personally reviewed the radiological images as listed and agreed with the findings in the report. Mr Abdomen W Wo Contrast 08/27/2014    IMPRESSION:  1. Stigmata of hepatic cirrhosis and hepatic fibrosis with 4.9 x 4.3 x 3.4 cm hypervascular lesion in segment 7 of the liver which has imaging characteristics in the setting of cirrhosis that are diagnostic of hepatocellular carcinoma. If not already obtained, multidisciplinary (surgical, oncological and interventional radiology) consultation is cinsidered. References: Molly Maduro; American  Association for the Study of Liver Diseases. Management of hepatocellular carcinoma: an update. Hepatology 2011;53:1020-1022, and [place] for Liver Transplant Allocation: Standardization of Liver Imaging, Diagnosis, Classification, and Reporting of Hepatocellular Carcinoma 1. Radiology: Volume 266: Number 2-February 2013).  2. 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  3. Stigmata of portal hypertension, with multiple portosystemic collateral pathways and splenomegaly, as above. A TIPS is Being considered, and appears patent.  4. Cholelithiasis and/or biliary sludge in the gallbladder. No current findings to suggest an  acute cholecystitis at this time. T   ASSESSMENT & PLAN:  Is a 53 year old Caucasian male with untreated hepatitis C, liver cirrhosis with portal hypertension, ascites status post TIP, history of GI bleeding problems versus and hepatic encephalopathy. His Child-Pugh score is 10 (class C), although he recovers pretty well with medical management for his liver cirrhosis. He has a 4.9 cm liver mass with typical image findings consistent with HCC, and elevated AFP at 1044, so his diagnosis of [place] is quite certain. I don't think he needs needle biopsied  to confirm the diagnosis. He likely has T1N0 disease, I will obtain a CT chest and a bone scan to rule out distant metastasis.  We did discuss the treatment option of liver transplant versus liver targeted therapy for his [place]. Although his liver tumor is within [place] for liver transplant, however he is still actively drinking alcohol, and has limited social support.  He may not be a good candidate for liver transplant. Dr. Ardis Hughs has progressed and liver transplant evaluation in the liver clinic for him.  He would be a good candidate for liver targeted therapy if staging scan shows no distant deficits. Giving the size of his tumor, I think TACE may be more effective than ablation therapy, but I'm not sure if TACE is still feasible due to his TIP procedure. He is scheduled to see interventional radiologist next week. I'll discuss with the interventional radiologist about this options.  We also discussed the systemic chemotherapy and sorafenib option. In general, [place] is not very sensitive to chemotherapy. Given his poor liver function and no platelet count I would not offer chemotherapy even if he has metastatic disease. Sorafenib can be considered if he has other disease or progress after liver Therapy.  I'll see him back in 2 weeks and finalize his treatment plan. He'll continue following up with Dr. Ardis Hughs for his liver cirrhosis and hepatitis  C.  We again discussed alcohol cessation and he is willing to quit.    All questions were answered. The patient knows to call the clinic with any problems, questions or concerns. I spent 40 minutes counseling the patient face to face. The total time spent in the appointment was 60 minutes and more than 50% was on counseling.     Truitt Merle, MD 09/17/2014 11:04 AM

## 2014-09-18 LAB — HEPATIC FUNCTION PANEL
ALT: 35 U/L (ref 0–53)
AST: 73 U/L — ABNORMAL HIGH (ref 0–37)
Albumin: 2.9 g/dL — ABNORMAL LOW (ref 3.5–5.2)
Alkaline Phosphatase: 131 U/L — ABNORMAL HIGH (ref 39–117)
BILIRUBIN DIRECT: 0.6 mg/dL — AB (ref 0.0–0.3)
Indirect Bilirubin: 1 mg/dL (ref 0.2–1.2)
TOTAL PROTEIN: 7.2 g/dL (ref 6.0–8.3)
Total Bilirubin: 1.6 mg/dL — ABNORMAL HIGH (ref 0.2–1.2)

## 2014-09-18 LAB — AFP TUMOR MARKER: AFP-Tumor Marker: 2892.5 ng/mL — ABNORMAL HIGH (ref <6.1)

## 2014-09-18 LAB — AFP TUMOR MARKER-PREVIOUS METHOD: AFP TUMOR MARKER: 3179.3 ng/mL — AB (ref 0.0–8.0)

## 2014-09-24 ENCOUNTER — Other Ambulatory Visit: Payer: Medicaid Other

## 2014-09-24 ENCOUNTER — Other Ambulatory Visit (HOSPITAL_COMMUNITY): Payer: Self-pay | Admitting: Diagnostic Radiology

## 2014-09-24 ENCOUNTER — Ambulatory Visit
Admission: RE | Admit: 2014-09-24 | Discharge: 2014-09-24 | Disposition: A | Discharge status: Home / Self Care | Payer: Medicaid Other | Source: Ambulatory Visit | Attending: Diagnostic Radiology | Admitting: Diagnostic Radiology

## 2014-09-24 DIAGNOSIS — K7460 Unspecified cirrhosis of liver: Secondary | ICD-10-CM

## 2014-09-24 NOTE — Consult Note (Signed)
Chief Complaint: Treatment for hepatocellular carcinoma.   Referring Physician(s): Truitt Merle History of Present Illness: Oscar Swanson is a 53 y.o. male who is an established patient from a prior TIPS procedure. Patient has hepatitis C, cirrhosis and treated with a TIPS procedure for ascites and history of variceal bleeding. On a follow-up TIPS ultrasound, patient was noted have a lesion in the liver. Follow-up MRI demonstrated 4.9 cm hypervascular lesion in segment 7 of the liver with imaging characteristics compatible with hepatocellular carcinoma. In addition, the patient's AFP is elevated and continues to increase. Patient was recently seen by Dr. Burr Medico in Oncology. The patient is not a candidate for tumor resection. The patient may be a candidate for liver transportation in the future but he just stopped drinking last week. His hepatic encephalopathy has been well controlled with medical management. No significant recurrent ascites since the TIPS procedure. Overall, his functional status is good. Patient continues to smoke at least a half pack of cigarettes per day.  The patient does complain of vague abdominal and chest pains intermittently.   Past Medical History  Diagnosis Date  . Diabetes mellitus without complication   . Schizophrenia     onset age 52.  in and out of psych facilities from age 48 to age 54.  s/p electroconvulsive therapy.  . TB (tuberculosis)     sister says this dx'd at age 69 and treated  . Hepatic encephalopathy   . Esophageal varices   . GI bleed   . Cirrhosis   . Thrombocytopenia   . Alcohol abuse   . Mild Coagulopathy 11/08/2013  . Hepatitis C 12/02/2013  . Smoking 09/23/2013    Past Surgical History  Procedure Laterality Date  . Cataract surgery  Bilateral   . Esophagogastroduodenoscopy N/A 11/08/2013    Procedure: ESOPHAGOGASTRODUODENOSCOPY (EGD);  Surgeon: Milus Banister, MD;  Location: [facility];  Service: Endoscopy;  Laterality: N/A;  bedside    . Radiology with anesthesia N/A 12/26/2013    Procedure: Transjugular Intrahepatic Portosystemic Shunt (TIPS);  Surgeon: Carylon Perches, MD;  Location: [facility];  Service: Radiology;  Laterality: N/A;    Allergies: Haldol and Oxycodone  Medications: Prior to Admission medications   Medication Sig Start Date End Date Taking? Authorizing Provider  beclomethasone (QVAR) 80 MCG/ACT inhaler Inhale 2 puffs into the lungs 2 (two) times daily.    Yes Historical Provider, MD  folic acid (FOLVITE) 1 MG tablet Take 1 tablet (1 mg total) by mouth daily. 12/02/13  Yes Tresa Garter, MD  furosemide (LASIX) 40 MG tablet Take 1 tablet (40 mg total) by mouth 2 (two) times daily. 08/15/14  Yes Milus Banister, MD  lactulose (CHRONULAC) 10 GM/15ML solution Take 30 mLs (20 g total) by mouth 2 (two) times daily. Take 20 G (30 ml) at breakfast and take 20 G (30 ml) at lunch. 08/15/14  Yes Milus Banister, MD  ondansetron (ZOFRAN) 4 MG tablet Take 1 tablet (4 mg total) by mouth every 6 (six) hours as needed for nausea. 01/27/14  Yes Amy S Esterwood, PA-C  pantoprazole (PROTONIX) 40 MG tablet Take 1 tablet (40 mg total) by mouth daily. 08/15/14  Yes Milus Banister, MD  rifaximin (XIFAXAN) 550 MG TABS tablet Take 1 tablet (550 mg total) by mouth 2 (two) times daily. 08/15/14  Yes Milus Banister, MD  spironolactone (ALDACTONE) 100 MG tablet Take 1 tablet (100 mg total) by mouth 2 (two) times daily. 08/15/14  Yes Quillian Quince  Merrily Brittle, MD  thiamine (VITAMIN B-1) 100 MG tablet Take 100 mg by mouth daily.   Yes Historical Provider, MD  ENSURE (ENSURE) Take 1 Can by mouth 2 (two) times daily between meals. Patient not taking: Reported on 09/24/2014 09/16/14   Milus Banister, MD  oxyCODONE-acetaminophen (PERCOCET/ROXICET) 5-325 MG per tablet Take 1 tab only per day as needed for pain. Patient not taking: Reported on 09/24/2014 02/10/14   Amy S Esterwood, PA-C    Family History  Problem Relation Age of Onset  . Hypertension  Mother   . Colon cancer Neg Hx   . Lung cancer Mother     smoker  . Colon polyps Sister     twin sister    History   Social History  . Marital Status: Single    Spouse Name: N/A    Number of Children: 0  . Years of Education: N/A   Social History Main Topics  . Smoking status: Current Some Day Smoker -- 0.50 packs/day for 25 years    Types: Cigarettes  . Smokeless tobacco: Never Used     Comment: Tobacco information given 02/10/14  . Alcohol Use: Yes     Comment:  drink beer socially   . Drug Use: No     Comment: not currently  . Sexual Activity: Not on file   Other Topics Concern  . Not on file   Social History Narrative     Review of Systems  Constitutional: Negative.   Respiratory: Negative.        Occasion right upper chest pain.  Cardiovascular:       Occasion right upper chest pain.  Gastrointestinal: Positive for abdominal pain. Negative for abdominal distention.  Neurological:       Confusion, especially if he doesn't sleep well.    Vital Signs: BP 118/78 mmHg  Pulse 74  Temp(Src) 98.1 F (36.7 C) (Oral)  Resp 14  Ht 5' 8.5" (1.74 m)  Wt 226 lb (102.513 kg)  BMI 33.86 kg/m2  SpO2 100%  Physical Exam  Constitutional: He appears well-nourished.  Cardiovascular: Normal rate and regular rhythm.   Pulmonary/Chest: Effort normal and breath sounds normal.  Abdominal: Soft. Bowel sounds are normal. He exhibits no distension.  Musculoskeletal:  No ankle swelling.    Imaging: Mr Abdomen W Wo Contrast  08/27/2014   CLINICAL DATA:  53 year old male with history of cirrhosis from alcohol and hepatitis-C status post DIP S are not 12/26/2013. Recent ultrasound examination demonstrated a suspicious lesion in the right hepatic lobe, concerning for potential hepatocellular carcinoma.  EXAM: MRI ABDOMEN WITHOUT AND WITH CONTRAST  TECHNIQUE: Multiplanar multisequence MR imaging of the abdomen was performed both before and after the administration of intravenous  contrast.  CONTRAST:  9 mL of Eovist  COMPARISON:  Abdominal ultrasound 08/13/2014. CT of the abdomen and pelvis 12/10/2013.  FINDINGS: The lesion of concern is in segment 7 of the liver adjacent to the liver capsule, and measures 4.9 x 4.3 x 3.4 cm with macrolobulated margins. This lesion is heterogeneous in signal intensity on pre gadolinium T1 weighted images (predominantly isointense to slightly hyperintense), heterogeneous least slightly T2 hyperintense, demonstrates internal areas of diffusion restriction, and demonstrates heterogeneous hypervascular enhancement on arterial phase imaging with subsequent washout at 20 minutes, diagnostic of hepatocellular carcinoma. This lesion does not appear to extend into the hepatic veins. This lesion is separate from the portal veins. No other suspicious hepatic lesions are noted at this time. However, in segment 3 of  the liver there is a 1.9 cm lesion that is T1 hypointense, T2 hyperintense, which demonstrates peripheral nodular enhancement with progressive centripetal filling, compatible with a cavernous hemangioma. The liver has a shrunken appearance and nodular contour, compatible with underlying cirrhosis. There is a lace-like pattern of T2 hyperintensity throughout the hepatic parenchyma on T2 weighted images, compatible with developing hepatic fibrosis. The portal vein is mildly dilated measuring 16 mm in the porta hepatis. Susceptibility artifact is noted extending from the right portal vein to the middle hepatic vein, presumably the reported TIPS. Numerous portosystemic collateral pathways are noted, including a recannulized paraumbilical vein.  Heterogeneous high T1 signal intensity is noted dependently in the gallbladder, corresponding several filling defects on T2 weighted images, compatible with small gallstones and/or biliary sludge. No current findings to suggest an acute cholecystitis at this time. The spleen is enlarged measuring 12.2 x 8.2 x 15.0 cm  (estimated splenic volume of 750 mL). The appearance of the pancreas, bilateral adrenal glands and bilateral kidneys is unremarkable. Trace amount of T2 hyperintensity throughout root of the small bowel mesenteries may suggest a trace volume of ascites or mesenteric edema.  IMPRESSION: 1. Stigmata of hepatic cirrhosis and hepatic fibrosis with 4.9 x 4.3 x 3.4 cm hypervascular lesion in segment 7 of the liver which has imaging characteristics in the setting of cirrhosis that are diagnostic of hepatocellular carcinoma. If not already obtained, multidisciplinary (surgical, oncological and interventional radiology) consultation is recommended. References: Bruix Maury Dus; American Association for the Study of Liver Diseases. Management of hepatocellular carcinoma: an update. Hepatology 2011;53:1020-1022, and [place] for Liver Transplant Allocation: Standardization of Liver Imaging, Diagnosis, Classification, and Reporting of Hepatocellular Carcinoma 1. Radiology: Volume 266: Number 2-February 2013). 2. 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 3. Stigmata of portal hypertension, with multiple portosystemic collateral pathways and splenomegaly, as above. A TIPS is noted, and appears patent. 4. Cholelithiasis and/or biliary sludge in the gallbladder. No current findings to suggest an acute cholecystitis at this time. These results will be called to the ordering clinician or representative by the Radiologist Assistant, and communication documented in the PACS or zVision Dashboard.   Electronically Signed   By: Vinnie Langton M.D.   On: 08/27/2014 13:37    Labs:  CBC:  Recent Labs  02/10/14 1114 02/19/14 1444 08/15/14 1428 09/17/14 1237  WBC 3.0* 3.2* 4.5 4.5  HGB 11.4* 10.7* 12.1* 12.8*  HCT 34.1* 32.4* 36.6* 38.2*  PLT 81.0* 58* 66.0* 54*    COAGS:  Recent Labs  11/08/13 1230 12/10/13 2045  01/27/14 1045 02/19/14 1444 08/15/14 1428 09/17/14 1237    INR 1.66* 1.90*  < > 1.8* 1.67* 1.5* 1.51*  APTT 31 37  --   --   --   --   --   < > = values in this interval not displayed.  BMP:  Recent Labs  12/28/13 0551 12/29/13 0605 12/30/13 0620  02/19/14 1444 04/01/14 0845 08/15/14 1428 09/16/14 1555  NA 132* 136* 132*  < > 135* 132* 132*  132* 135  K 4.3 4.4 4.2  < > 3.8 4.0 4.0  4.0 4.2  CL 99 103 100  < > 104 104 108  108 107  CO2 25 24 24   < > 23 23 21  21 24   GLUCOSE 98 94 95  < > 67* 138* 91  91 68*  BUN 7 6 7   < > 9 9 8  8   8  14  CALCIUM 8.0* 8.2* 8.1*  < > 8.8 8.7 8.8  8.8 9.0  CREATININE 0.69 0.66 0.65  < > 0.73 0.9 0.8  0.8  0.8 0.9  GFRNONAA >90 >90 >90  --  >90  --   --   --   GFRAA >90 >90 >90  --  >90  --   --   --   < > = values in this interval not displayed.  LIVER FUNCTION TESTS:  Recent Labs  02/19/14 1444 04/01/14 0845 08/15/14 1428 09/17/14 1238  BILITOT 2.6* 2.4* 2.5* 1.6*  AST 96* 49* 56* 73*  ALT 57* 26 25 35  ALKPHOS 168* 113 119* 131*  PROT 7.6 7.0 7.1 7.2  ALBUMIN 2.3* 2.7* 2.4* 2.9*    TUMOR MARKERS:  Recent Labs  12/23/13 0450 04/01/14 0942 08/15/14 1437 09/17/14 1238  AFPTM 7.6 79.2* 1043.7* 2892.5*    Assessment and Plan:  53 year old with cirrhosis, hepatitis C, portal hypertension and solitary hepatocellular carcinoma. I reviewed the imaging findings with the patient and his niece. Based on the size and location of the tumor, the lesion will be difficult to treat with percutaneous ablation at this time. Fortunately, the patient's liver function has not worsened following the TIPS procedure and the total bilirubin is now 1.6. I think that the patient would tolerate a selective TACE procedure. I explained transarterial chemoembolization and transarterial bland embolization with the patient in depth. I feel the patient has a good understanding of the risks and benefits. The patient was agreeable to undergoing this procedure. I explained that he may require multiple  transarterial embolization procedures but if the lesion responds well, he may be a candidate for percutaneous thermal ablation in the future.  We will schedule the patient for transarterial chemoembolization of the solitary liver tumor. Patient will likely require a night in the hospital following the procedure for treatment of post embolic syndrome.   The patient's portal hypertension appears to be adequately treated at this time with medical therapy and TIPS procedure. The patient is not requiring large volume paracentesis for ascites and does not appear to have significant hepatic encephalopathy at this time.   Thank you for this interesting consult.  I greatly enjoyed meeting ARDON FRANKLIN and look forward to participating in their care.    I spent a total of 20 minutes face to face in clinical consultation, greater than 50% of which was counseling/coordinating care fohepatocellular carcinoma.  Signed: Carylon Perches 09/24/2014, 4:40 PM

## 2014-09-29 ENCOUNTER — Encounter (HOSPITAL_COMMUNITY)
Admission: RE | Admit: 2014-09-29 | Discharge: 2014-09-29 | Disposition: A | Discharge status: Home / Self Care | Payer: Medicaid Other | Source: Ambulatory Visit | Attending: Hematology | Admitting: Hematology

## 2014-09-29 ENCOUNTER — Ambulatory Visit (HOSPITAL_COMMUNITY)
Admission: RE | Admit: 2014-09-29 | Discharge: 2014-09-29 | Disposition: A | Discharge status: Home / Self Care | Payer: Medicaid Other | Source: Ambulatory Visit | Attending: Hematology | Admitting: Hematology

## 2014-09-29 DIAGNOSIS — K8020 Calculus of gallbladder without cholecystitis without obstruction: Secondary | ICD-10-CM | POA: Diagnosis not present

## 2014-09-29 DIAGNOSIS — I2510 Atherosclerotic heart disease of native coronary artery without angina pectoris: Secondary | ICD-10-CM | POA: Insufficient documentation

## 2014-09-29 DIAGNOSIS — C220 Liver cell carcinoma: Secondary | ICD-10-CM | POA: Diagnosis present

## 2014-09-29 DIAGNOSIS — Z9181 History of falling: Secondary | ICD-10-CM | POA: Insufficient documentation

## 2014-09-29 DIAGNOSIS — M79606 Pain in leg, unspecified: Secondary | ICD-10-CM | POA: Diagnosis present

## 2014-09-29 DIAGNOSIS — J432 Centrilobular emphysema: Secondary | ICD-10-CM | POA: Diagnosis not present

## 2014-09-29 DIAGNOSIS — N62 Hypertrophy of breast: Secondary | ICD-10-CM | POA: Insufficient documentation

## 2014-09-29 DIAGNOSIS — R911 Solitary pulmonary nodule: Secondary | ICD-10-CM | POA: Insufficient documentation

## 2014-09-29 DIAGNOSIS — K7460 Unspecified cirrhosis of liver: Secondary | ICD-10-CM | POA: Diagnosis not present

## 2014-09-29 DIAGNOSIS — K760 Fatty (change of) liver, not elsewhere classified: Secondary | ICD-10-CM | POA: Insufficient documentation

## 2014-09-29 MED ORDER — TECHNETIUM TC 99M MEDRONATE IV KIT
26.8000 | PACK | Freq: Once | INTRAVENOUS | Status: AC | PRN
  Administered 2014-09-29: 26.8 via INTRAVENOUS

## 2014-09-30 ENCOUNTER — Encounter: Payer: Medicaid Other | Admitting: Nutrition

## 2014-09-30 ENCOUNTER — Other Ambulatory Visit: Payer: Self-pay | Admitting: Diagnostic Radiology

## 2014-09-30 ENCOUNTER — Ambulatory Visit (HOSPITAL_BASED_OUTPATIENT_CLINIC_OR_DEPARTMENT_OTHER): Payer: Medicaid Other | Admitting: Hematology

## 2014-09-30 ENCOUNTER — Telehealth: Payer: Self-pay | Admitting: Hematology

## 2014-09-30 ENCOUNTER — Encounter: Payer: Self-pay | Admitting: Hematology

## 2014-09-30 VITALS — BP 116/65 | HR 65 | Temp 98.4°F | Resp 18 | Ht 68.5 in | Wt 228.6 lb

## 2014-09-30 DIAGNOSIS — C220 Liver cell carcinoma: Secondary | ICD-10-CM

## 2014-09-30 DIAGNOSIS — B1920 Unspecified viral hepatitis C without hepatic coma: Secondary | ICD-10-CM

## 2014-09-30 DIAGNOSIS — K766 Portal hypertension: Secondary | ICD-10-CM

## 2014-09-30 DIAGNOSIS — F209 Schizophrenia, unspecified: Secondary | ICD-10-CM

## 2014-09-30 DIAGNOSIS — K7460 Unspecified cirrhosis of liver: Secondary | ICD-10-CM

## 2014-09-30 MED ORDER — TRAMADOL HCL 50 MG PO TABS: 50.0000 mg | ORAL_TABLET | Freq: Four times a day (QID) | ORAL | Status: DC | PRN

## 2014-09-30 NOTE — Telephone Encounter (Signed)
gv adn printed appts ched and avs for pt for DEC °

## 2014-09-30 NOTE — Progress Notes (Signed)
[facility] OFFICE PROGRESS NOTE  Patient Care Team: Tresa Garter, MD as PCP - General (Internal Medicine)  SUMMARY OF ONCOLOGIC HISTORY:   [place] (hepatocellular carcinoma) T1N0M0 stage I   08/27/2014 Initial Diagnosis HCC (hepatocellular carcinoma)   08/27/2014 Imaging liver MRI W/WO iv contrast showed liver cirrhosis and a 4.9cm mass in right lobe liver, seg 7, with typical image characteristics of [place]. also found a 1.9cm hemangioma in segment 3 of liver.  AFP was 1044 on 08/15/14.    OTHER RELATED ISSUES: 1. Untreated Hep C 2. Liver cirrhosis secondary to hepatitis C and alcohol abuse, with portal hypertension, ascites s/p TIP, history of hepatic encephalopathy in January 2014. Child-Pugh score is 10 (class C)  INTERVAL HISTORY: Mr. [place] returns for follow-up. He overall feels about the same as 3 weeks ago. He has moderate fatigue, Able to function well at the group home. He has chronic back pain, and he noticed slightly worsening of intermittent mid abdominal pain. He is to take Percocet one tablet at night, but ran out 3-4 weeks ago. No episodes of fever, chill, confusion, nausea, or bowel habits change.  REVIEW OF SYSTEMS:   Constitutional: Denies fevers, chills or abnormal weight loss, (+) fatigue  Eyes: Denies blurriness of vision Ears, nose, mouth, throat, and face: Denies mucositis or sore throat Respiratory: Denies cough, dyspnea or wheezes Cardiovascular: Denies palpitation, chest discomfort or lower extremity swelling Gastrointestinal:  Denies nausea, heartburn or change in bowel habits, (+) bdominal distention Skin: Denies abnormal skin rashes Lymphatics: Denies new lymphadenopathy or easy bruising Neurological:Denies numbness, tingling or new weaknesses Behavioral/Psych: Mood is stable, no new changes  All other systems were reviewed with the patient and are negative.  MEDICAL HISTORY:  Past Medical History  Diagnosis Date  . Diabetes mellitus  without complication   . Schizophrenia     onset age 34.  in and out of psych facilities from age 76 to age 70.  s/p electroconvulsive therapy.  . TB (tuberculosis)     sister says this dx'd at age 75 and treated  . Hepatic encephalopathy   . Esophageal varices   . GI bleed   . Cirrhosis   . Thrombocytopenia   . Alcohol abuse   . Mild Coagulopathy 11/08/2013  . Hepatitis C 12/02/2013  . Smoking 09/23/2013    SURGICAL HISTORY: Past Surgical History  Procedure Laterality Date  . Cataract surgery  Bilateral   . Esophagogastroduodenoscopy N/A 11/08/2013    Procedure: ESOPHAGOGASTRODUODENOSCOPY (EGD);  Surgeon: Milus Banister, MD;  Location: [facility];  Service: Endoscopy;  Laterality: N/A;  bedside  . Radiology with anesthesia N/A 12/26/2013    Procedure: Transjugular Intrahepatic Portosystemic Shunt (TIPS);  Surgeon: Carylon Perches, MD;  Location: [facility];  Service: Radiology;  Laterality: N/A;    I have reviewed the social history and family history with the patient and they are unchanged from previous note.  ALLERGIES:  is allergic to haldol and oxycodone.  MEDICATIONS:  Current Outpatient Prescriptions  Medication Sig Dispense Refill  . ENSURE (ENSURE) Take 1 Can by mouth 2 (two) times daily between meals. 568 mL 12  . folic acid (FOLVITE) 1 MG tablet Take 1 tablet (1 mg total) by mouth daily. 90 tablet 3  . furosemide (LASIX) 40 MG tablet Take 1 tablet (40 mg total) by mouth 2 (two) times daily. 30 tablet 6  . lactulose (CHRONULAC) 10 GM/15ML solution Take 30 mLs (20 g total) by mouth 2 (two) times daily. Take  20 G (30 ml) at breakfast and take 20 G (30 ml) at lunch. 240 mL 3  . ondansetron (ZOFRAN) 4 MG tablet Take 1 tablet (4 mg total) by mouth every 6 (six) hours as needed for nausea. 40 tablet 0  . pantoprazole (PROTONIX) 40 MG tablet Take 1 tablet (40 mg total) by mouth daily. 30 tablet 6  . rifaximin (XIFAXAN) 550 MG TABS tablet Take 1 tablet (550 mg total) by mouth 2 (two)  times daily. 60 tablet 6  . spironolactone (ALDACTONE) 100 MG tablet Take 1 tablet (100 mg total) by mouth 2 (two) times daily. 60 tablet 6  . thiamine (VITAMIN B-1) 100 MG tablet Take 100 mg by mouth daily.    . beclomethasone (QVAR) 80 MCG/ACT inhaler Inhale 2 puffs into the lungs 2 (two) times daily.     Marland Kitchen oxyCODONE-acetaminophen (PERCOCET/ROXICET) 5-325 MG per tablet Take 1 tab only per day as needed for pain. (Patient not taking: Reported on 09/24/2014) 25 tablet 0   No current facility-administered medications for this visit.    PHYSICAL EXAMINATION: ECOG PERFORMANCE STATUS: 1 - Symptomatic but completely ambulatory  Filed Vitals:   09/30/14 1047  BP: 116/65  Pulse: 65  Temp: 98.4 F (36.9 C)  Resp: 18   Filed Weights   09/30/14 1047  Weight: 228 lb 9.6 oz (103.692 kg)    GENERAL:alert, no distress and comfortable SKIN: skin color, texture, turgor are normal, no rashes or significant lesions EYES: normal, Conjunctiva are pink and non-injected, sclera clear OROPHARYNX:no exudate, no erythema and lips, buccal mucosa, and tongue normal  NECK: supple, thyroid normal size, non-tender, without nodularity LYMPH:  no palpable lymphadenopathy in the cervical, axillary or inguinal LUNGS: clear to auscultation and percussion with normal breathing effort HEART: regular rate & rhythm and no murmurs and no lower extremity edema ABDOMEN:abdomen soft, non-tender and normal bowel sounds Musculoskeletal:no cyanosis of digits and no clubbing  NEURO: alert & oriented x 3 with fluent speech, no focal motor/sensory deficits  LABORATORY DATA:  I have reviewed the data as listed    Component Value Date/Time   NA 135 09/16/2014 1555   K 4.2 09/16/2014 1555   CL 107 09/16/2014 1555   CO2 24 09/16/2014 1555   GLUCOSE 68* 09/16/2014 1555   BUN 14 09/16/2014 1555   CREATININE 0.9 09/16/2014 1555   CALCIUM 9.0 09/16/2014 1555   PROT 7.2 09/17/2014 1238   ALBUMIN 2.9* 09/17/2014 1238   AST  73* 09/17/2014 1238   ALT 35 09/17/2014 1238   ALKPHOS 131* 09/17/2014 1238   BILITOT 1.6* 09/17/2014 1238   GFRNONAA >90 02/19/2014 1444   GFRAA >90 02/19/2014 1444    No results found for: SPEP, UPEP  Lab Results  Component Value Date   WBC 4.5 09/17/2014   NEUTROABS 2.3 09/17/2014   HGB 12.8* 09/17/2014   HCT 38.2* 09/17/2014   MCV 91.8 09/17/2014   PLT 54* 09/17/2014      Chemistry      Component Value Date/Time   NA 135 09/16/2014 1555   K 4.2 09/16/2014 1555   CL 107 09/16/2014 1555   CO2 24 09/16/2014 1555   BUN 14 09/16/2014 1555   CREATININE 0.9 09/16/2014 1555      Component Value Date/Time   CALCIUM 9.0 09/16/2014 1555   ALKPHOS 131* 09/17/2014 1238   AST 73* 09/17/2014 1238   ALT 35 09/17/2014 1238   BILITOT 1.6* 09/17/2014 1238       RADIOGRAPHIC STUDIES: I  have personally reviewed the radiological images as listed and agreed with the findings in the report.  Ct Chest Wo Contrast 09/29/2014    IMPRESSION:  1. Minimal motion degradation.  2. Given this factor, no evidence of metastatic disease within the chest.  3. Age advanced coronary artery atherosclerosis. Recommend assessment of coronary risk factors and consideration of medical therapy. 4. Gynecomastia.     Nm Bone Scan Whole Body 09/29/2014     IMPRESSION:  Negative for osseous metastatic disease.   Electronically Signed   By: Jacqulynn Cadet M.D.   On: 09/29/2014 14:07     ASSESSMENT & PLAN:  Mr. Askren is a 53 year old Caucasian male with untreated hepatitis C, liver cirrhosis with portal hypertension, ascites status post TIP, history of GI bleeding problems and hepatic encephalopathy. His Child-Pugh score is 10 (class C), although he recovers pretty well with medical management for his liver cirrhosis. He has a 4.9 cm liver mass with typical image findings consistent with HCC, and elevated AFP at 1044, so his diagnosis of [place] is quite certain. CT chest and bone scan was negative for  distant mets, which I reviewed with him today. He has T1N0M0 stage I HCC.   Plan -IR Dr. Anselm Pancoast has seen him and offered liver embolization, which is likely going to be scheduled soon. I agree with the plan. I discussed with Dr. Anselm Pancoast.  -He understands there is risk of bleeding, infection, post embolization syndrome (abdominal pain, fever, worsening liver function etc.). He agrees to proceed. -He needs lab test with liver function and CBC in 1-2 weeks after his liver embolization. If it's not scheduled by Dr. Gwenlyn Saran, I'll be happy to see him and repeat his lab. -Repeat liver MRI in 3 months to evaluate his response to tumor embolization -I encouraged him to quit alcohol drinking completely, he agrees to try. -He will continue follow-up with Dr. Ardis Hughs for his liver cirrhosis management.  RTC in 3 weeks with labs.   I spent 15 minutes counseling the patient face to face. The total time spent in the appointment was 25 minutes and more than 50% was on counseling and review of test results     Truitt Merle, MD 09/30/2014 11:37 AM

## 2014-10-02 ENCOUNTER — Telehealth: Payer: Self-pay | Admitting: Nutrition

## 2014-10-02 ENCOUNTER — Encounter: Payer: Medicaid Other | Admitting: Nutrition

## 2014-10-02 NOTE — Telephone Encounter (Signed)
Patient cancelled nutrition appointment.  Asked me to call him back but did not leave a phone number.  Called home phone number in EPIC.  Phone answered by "sister" who reports he is not there and she doesn't know his phone number.  Then she stated she would call patient and have him return my call.

## 2014-10-03 ENCOUNTER — Other Ambulatory Visit: Payer: Self-pay | Admitting: Radiology

## 2014-10-03 ENCOUNTER — Other Ambulatory Visit: Payer: Self-pay | Admitting: Diagnostic Radiology

## 2014-10-03 DIAGNOSIS — C220 Liver cell carcinoma: Secondary | ICD-10-CM

## 2014-10-03 MED ORDER — DOXORUBICIN HCL 50 MG IV SOLR: 75.0000 mg | Freq: Once | INTRAVENOUS | Status: DC

## 2014-10-06 ENCOUNTER — Ambulatory Visit: Payer: Medicaid Other | Admitting: Gastroenterology

## 2014-10-06 MED ORDER — DOXORUBICIN HCL 50 MG IV SOLR
75.0000 mg | Freq: Once | INTRAVENOUS | Status: AC
  Administered 2014-10-09: 75 mg via INTRA_ARTERIAL
  Filled 2014-10-06: qty 75

## 2014-10-07 ENCOUNTER — Other Ambulatory Visit: Payer: Self-pay | Admitting: *Deleted

## 2014-10-07 DIAGNOSIS — C220 Liver cell carcinoma: Secondary | ICD-10-CM

## 2014-10-08 ENCOUNTER — Other Ambulatory Visit: Payer: Self-pay | Admitting: Radiology

## 2014-10-08 ENCOUNTER — Other Ambulatory Visit: Payer: Medicaid Other

## 2014-10-09 ENCOUNTER — Encounter (HOSPITAL_COMMUNITY): Payer: Self-pay

## 2014-10-09 ENCOUNTER — Ambulatory Visit (HOSPITAL_COMMUNITY)
Admission: RE | Admit: 2014-10-09 | Discharge: 2014-10-09 | Disposition: A | Discharge status: Home / Self Care | Payer: Medicaid Other | Source: Ambulatory Visit | Attending: Diagnostic Radiology | Admitting: Diagnostic Radiology

## 2014-10-09 ENCOUNTER — Observation Stay (HOSPITAL_COMMUNITY)
Admission: AD | Admit: 2014-10-09 | Discharge: 2014-10-10 | Disposition: A | Discharge status: Home / Self Care | Payer: Medicaid Other | Source: Ambulatory Visit | Attending: Diagnostic Radiology | Admitting: Diagnostic Radiology

## 2014-10-09 DIAGNOSIS — F209 Schizophrenia, unspecified: Secondary | ICD-10-CM | POA: Insufficient documentation

## 2014-10-09 DIAGNOSIS — K7460 Unspecified cirrhosis of liver: Secondary | ICD-10-CM | POA: Insufficient documentation

## 2014-10-09 DIAGNOSIS — B1920 Unspecified viral hepatitis C without hepatic coma: Secondary | ICD-10-CM | POA: Diagnosis not present

## 2014-10-09 DIAGNOSIS — C220 Liver cell carcinoma: Secondary | ICD-10-CM | POA: Diagnosis present

## 2014-10-09 DIAGNOSIS — D696 Thrombocytopenia, unspecified: Secondary | ICD-10-CM | POA: Diagnosis not present

## 2014-10-09 DIAGNOSIS — E119 Type 2 diabetes mellitus without complications: Secondary | ICD-10-CM | POA: Insufficient documentation

## 2014-10-09 DIAGNOSIS — F17210 Nicotine dependence, cigarettes, uncomplicated: Secondary | ICD-10-CM | POA: Insufficient documentation

## 2014-10-09 LAB — CBC WITH DIFFERENTIAL/PLATELET
BASOS ABS: 0 10*3/uL (ref 0.0–0.1)
BASOS PCT: 0 % (ref 0–1)
EOS ABS: 0.1 10*3/uL (ref 0.0–0.7)
EOS PCT: 1 % (ref 0–5)
HCT: 34.4 % — ABNORMAL LOW (ref 39.0–52.0)
Hemoglobin: 11.8 g/dL — ABNORMAL LOW (ref 13.0–17.0)
Lymphocytes Relative: 18 % (ref 12–46)
Lymphs Abs: 1.1 10*3/uL (ref 0.7–4.0)
MCH: 31.1 pg (ref 26.0–34.0)
MCHC: 34.3 g/dL (ref 30.0–36.0)
MCV: 90.8 fL (ref 78.0–100.0)
Monocytes Absolute: 0.9 10*3/uL (ref 0.1–1.0)
Monocytes Relative: 15 % — ABNORMAL HIGH (ref 3–12)
Neutro Abs: 3.8 10*3/uL (ref 1.7–7.7)
Neutrophils Relative %: 65 % (ref 43–77)
PLATELETS: 62 10*3/uL — AB (ref 150–400)
RBC: 3.79 MIL/uL — ABNORMAL LOW (ref 4.22–5.81)
RDW: 18.7 % — AB (ref 11.5–15.5)
WBC: 5.8 10*3/uL (ref 4.0–10.5)

## 2014-10-09 LAB — COMPREHENSIVE METABOLIC PANEL
ALT: 34 U/L (ref 0–53)
AST: 63 U/L — AB (ref 0–37)
Albumin: 2.6 g/dL — ABNORMAL LOW (ref 3.5–5.2)
Alkaline Phosphatase: 131 U/L — ABNORMAL HIGH (ref 39–117)
Anion gap: 9 (ref 5–15)
BUN: 10 mg/dL (ref 6–23)
CALCIUM: 8.9 mg/dL (ref 8.4–10.5)
CO2: 21 mEq/L (ref 19–32)
Chloride: 104 mEq/L (ref 96–112)
Creatinine, Ser: 0.8 mg/dL (ref 0.50–1.35)
GFR calc Af Amer: >90 mL/min (ref >90)
GFR calc non Af Amer: >90 mL/min (ref >90)
Glucose, Bld: 103 mg/dL — ABNORMAL HIGH (ref 70–99)
Potassium: 3.9 mEq/L (ref 3.7–5.3)
SODIUM: 134 meq/L — AB (ref 137–147)
Total Bilirubin: 1.5 mg/dL — ABNORMAL HIGH (ref 0.3–1.2)
Total Protein: 7.1 g/dL (ref 6.0–8.3)

## 2014-10-09 LAB — PROTIME-INR
INR: 1.57 — ABNORMAL HIGH (ref 0.00–1.49)
Prothrombin Time: 18.9 seconds — ABNORMAL HIGH (ref 11.6–15.2)

## 2014-10-09 LAB — GLUCOSE, CAPILLARY: GLUCOSE-CAPILLARY: 100 mg/dL — AB (ref 70–99)

## 2014-10-09 MED ORDER — HYDROCODONE-ACETAMINOPHEN 5-325 MG PO TABS: 1.0000 | ORAL_TABLET | Freq: Once | ORAL | Status: DC

## 2014-10-09 MED ORDER — ONDANSETRON HCL 4 MG PO TABS: 4.0000 mg | ORAL_TABLET | Freq: Four times a day (QID) | ORAL | Status: DC | PRN

## 2014-10-09 MED ORDER — FUROSEMIDE 40 MG PO TABS
40.0000 mg | ORAL_TABLET | Freq: Two times a day (BID) | ORAL | Status: DC
Start: 2014-10-09 — End: 2014-10-10
  Administered 2014-10-09 – 2014-10-10 (×2): 40 mg via ORAL
  Filled 2014-10-09 (×4): qty 1

## 2014-10-09 MED ORDER — PIPERACILLIN-TAZOBACTAM 3.375 G IVPB
3.3750 g | Freq: Once | INTRAVENOUS | Status: AC
  Administered 2014-10-09: 3.375 g via INTRAVENOUS
  Filled 2014-10-09: qty 50

## 2014-10-09 MED ORDER — VITAMIN B-1 100 MG PO TABS
100.0000 mg | ORAL_TABLET | Freq: Every day | ORAL | Status: DC
  Administered 2014-10-10: 100 mg via ORAL
  Filled 2014-10-09: qty 1

## 2014-10-09 MED ORDER — SPIRONOLACTONE 100 MG PO TABS
100.0000 mg | ORAL_TABLET | Freq: Two times a day (BID) | ORAL | Status: DC
  Administered 2014-10-09 – 2014-10-10 (×2): 100 mg via ORAL
  Filled 2014-10-09 (×3): qty 1

## 2014-10-09 MED ORDER — IOHEXOL 300 MG/ML  SOLN
80.0000 mL | Freq: Once | INTRAMUSCULAR | Status: AC | PRN
  Administered 2014-10-09: 203 mL via INTRA_ARTERIAL

## 2014-10-09 MED ORDER — SODIUM CHLORIDE 0.9 % IJ SOLN: 3.0000 mL | INTRAMUSCULAR | Status: DC | PRN

## 2014-10-09 MED ORDER — ONDANSETRON 8 MG/NS 50 ML IVPB
8.0000 mg | Freq: Once | INTRAVENOUS | Status: AC
  Administered 2014-10-09: 8 mg via INTRAVENOUS
  Filled 2014-10-09: qty 8

## 2014-10-09 MED ORDER — SODIUM CHLORIDE 0.9 % IV SOLN: 250.0000 mL | INTRAVENOUS | Status: DC | PRN

## 2014-10-09 MED ORDER — MIDAZOLAM HCL 2 MG/2ML IJ SOLN
INTRAMUSCULAR | Status: AC
  Filled 2014-10-09: qty 6

## 2014-10-09 MED ORDER — MIDAZOLAM HCL 2 MG/2ML IJ SOLN
INTRAMUSCULAR | Status: AC | PRN
  Administered 2014-10-09 (×3): 0.5 mg via INTRAVENOUS
  Administered 2014-10-09: 1 mg via INTRAVENOUS
  Administered 2014-10-09 (×7): 0.5 mg via INTRAVENOUS
  Administered 2014-10-09: 1 mg via INTRAVENOUS

## 2014-10-09 MED ORDER — FENTANYL CITRATE 0.05 MG/ML IJ SOLN
INTRAMUSCULAR | Status: AC | PRN
  Administered 2014-10-09 (×2): 25 ug via INTRAVENOUS
  Administered 2014-10-09: 50 ug via INTRAVENOUS
  Administered 2014-10-09 (×4): 25 ug via INTRAVENOUS

## 2014-10-09 MED ORDER — PANTOPRAZOLE SODIUM 40 MG IV SOLR
INTRAVENOUS | Status: AC
  Filled 2014-10-09: qty 40

## 2014-10-09 MED ORDER — OXYCODONE HCL 5 MG PO TABS
5.0000 mg | ORAL_TABLET | ORAL | Status: DC | PRN
  Administered 2014-10-09 – 2014-10-10 (×2): 5 mg via ORAL
  Filled 2014-10-09 (×2): qty 1

## 2014-10-09 MED ORDER — ONDANSETRON HCL 4 MG/2ML IJ SOLN: 4.0000 mg | Freq: Four times a day (QID) | INTRAMUSCULAR | Status: DC | PRN

## 2014-10-09 MED ORDER — PANTOPRAZOLE SODIUM 40 MG IV SOLR
40.0000 mg | Freq: Once | INTRAVENOUS | Status: AC
  Administered 2014-10-09: 40 mg via INTRAVENOUS
  Filled 2014-10-09: qty 40

## 2014-10-09 MED ORDER — FOLIC ACID 1 MG PO TABS
1.0000 mg | ORAL_TABLET | Freq: Every day | ORAL | Status: DC
  Administered 2014-10-09 – 2014-10-10 (×2): 1 mg via ORAL
  Filled 2014-10-09 (×2): qty 1

## 2014-10-09 MED ORDER — SODIUM CHLORIDE 0.9 % IJ SOLN
3.0000 mL | Freq: Two times a day (BID) | INTRAMUSCULAR | Status: DC
  Administered 2014-10-09 (×2): 3 mL via INTRAVENOUS

## 2014-10-09 MED ORDER — PANTOPRAZOLE SODIUM 40 MG PO TBEC
40.0000 mg | DELAYED_RELEASE_TABLET | Freq: Every day | ORAL | Status: DC
  Administered 2014-10-09 – 2014-10-10 (×2): 40 mg via ORAL
  Filled 2014-10-09 (×2): qty 1

## 2014-10-09 MED ORDER — RIFAXIMIN 550 MG PO TABS
550.0000 mg | ORAL_TABLET | Freq: Two times a day (BID) | ORAL | Status: DC
  Administered 2014-10-09 – 2014-10-10 (×2): 550 mg via ORAL
  Filled 2014-10-09 (×3): qty 1

## 2014-10-09 MED ORDER — LIDOCAINE HCL 1 % IJ SOLN
INTRAMUSCULAR | Status: AC
  Filled 2014-10-09: qty 20

## 2014-10-09 MED ORDER — FENTANYL CITRATE 0.05 MG/ML IJ SOLN
INTRAMUSCULAR | Status: AC
  Filled 2014-10-09: qty 4

## 2014-10-09 MED ORDER — SODIUM CHLORIDE 0.9 % IV SOLN
INTRAVENOUS | Status: DC
  Administered 2014-10-09: 08:00:00 via INTRAVENOUS

## 2014-10-09 MED ORDER — HYDROMORPHONE HCL 2 MG/ML IJ SOLN
INTRAMUSCULAR | Status: AC
  Filled 2014-10-09: qty 1

## 2014-10-09 MED ORDER — BECLOMETHASONE DIPROPIONATE 80 MCG/ACT IN AERS
2.0000 | INHALATION_SPRAY | Freq: Two times a day (BID) | RESPIRATORY_TRACT | Status: DC
Start: 2014-10-09 — End: 2014-10-09

## 2014-10-09 MED ORDER — MIDAZOLAM HCL 2 MG/2ML IJ SOLN
INTRAMUSCULAR | Status: AC
  Filled 2014-10-09: qty 2

## 2014-10-09 MED ORDER — FLUTICASONE PROPIONATE HFA 44 MCG/ACT IN AERO
2.0000 | INHALATION_SPRAY | Freq: Two times a day (BID) | RESPIRATORY_TRACT | Status: DC
  Administered 2014-10-09 – 2014-10-10 (×2): 2 via RESPIRATORY_TRACT
  Filled 2014-10-09: qty 10.6

## 2014-10-09 MED ORDER — LACTULOSE 10 GM/15ML PO SOLN
20.0000 g | Freq: Two times a day (BID) | ORAL | Status: DC
  Administered 2014-10-10: 20 g via ORAL
  Filled 2014-10-09 (×3): qty 30

## 2014-10-09 NOTE — Sedation Documentation (Signed)
5Fr sheath removed from R femoral artery by Dr. Anselm Pancoast.  Hemostasis achieved using Exoseal closure device.  R groin level 0, 3+RDP.

## 2014-10-09 NOTE — Procedures (Signed)
Post-Procedure Note  Pre-operative Diagnosis: HCC and cirrhosis       Post-operative Diagnosis: Same   Indications: [facility]  Procedure Details:   Mesenteric arteriography performed in [place] and SMA.  Selective right hepatic arteriography performed.  Feeding vessels to [facility] identified from right hepatic artery.  Selective chemoembolization was performed with DEB-TACE.    Findings: Hypervascular lesion in the posterior hepatic dome.  Entire DEB-TACE dose given.    Complications: None     Condition: Stable  Plan: Keep overnight for observation.

## 2014-10-09 NOTE — Progress Notes (Signed)
Subjective: Patient s/p DEB-TACE of [place]. He c/o right groin pain. He denies any abdominal pain, nausea or vomiting.  Allergies: Haldol and Oxycodone  Medications: Prior to Admission medications   Medication Sig Start Date End Date Taking? Authorizing Provider  beclomethasone (QVAR) 80 MCG/ACT inhaler Inhale 2 puffs into the lungs 2 (two) times daily.    Yes Historical Provider, MD  folic acid (FOLVITE) 1 MG tablet Take 1 tablet (1 mg total) by mouth daily. 12/02/13  Yes Tresa Garter, MD  furosemide (LASIX) 40 MG tablet Take 1 tablet (40 mg total) by mouth 2 (two) times daily. 08/15/14  Yes Milus Banister, MD  lactulose (CHRONULAC) 10 GM/15ML solution Take 30 mLs (20 g total) by mouth 2 (two) times daily. Take 20 G (30 ml) at breakfast and take 20 G (30 ml) at lunch. 08/15/14  Yes Milus Banister, MD  ondansetron (ZOFRAN) 4 MG tablet Take 1 tablet (4 mg total) by mouth every 6 (six) hours as needed for nausea. 01/27/14  Yes Amy S Esterwood, PA-C  oxyCODONE-acetaminophen (PERCOCET/ROXICET) 5-325 MG per tablet Take 1 tab only per day as needed for pain. 02/10/14  Yes Amy S Esterwood, PA-C  pantoprazole (PROTONIX) 40 MG tablet Take 1 tablet (40 mg total) by mouth daily. 08/15/14  Yes Milus Banister, MD  rifaximin (XIFAXAN) 550 MG TABS tablet Take 1 tablet (550 mg total) by mouth 2 (two) times daily. 08/15/14  Yes Milus Banister, MD  spironolactone (ALDACTONE) 100 MG tablet Take 1 tablet (100 mg total) by mouth 2 (two) times daily. 08/15/14  Yes Milus Banister, MD  thiamine (VITAMIN B-1) 100 MG tablet Take 100 mg by mouth daily.   Yes Historical Provider, MD  traMADol (ULTRAM) 50 MG tablet Take 1 tablet (50 mg total) by mouth every 6 (six) hours as needed. 09/30/14  Yes Truitt Merle, MD  ENSURE (ENSURE) Take 1 Can by mouth 2 (two) times daily between meals. 09/16/14   Milus Banister, MD    Review of Systems  Vital Signs: BP 112/62 mmHg  Pulse 55  Temp(Src) 97.8 F (36.6 C) (Oral)   Resp 16  Ht 5' 8.5" (1.74 m)  Wt 228 lb (103.42 kg)  BMI 34.16 kg/m2  SpO2 100%  Physical Exam General: A&Ox3, NAD, lying in bed Abd: Soft, NT, ND, (+) BS Ext: RCFA access site dressing C/D/I, soft, tender, no signs of active bleeding or hematoma, DP 2+ B/L  Imaging: No results found.  Labs:  CBC:  Recent Labs  02/19/14 1444 08/15/14 1428 09/17/14 1237 10/09/14 0755  WBC 3.2* 4.5 4.5 5.8  HGB 10.7* 12.1* 12.8* 11.8*  HCT 32.4* 36.6* 38.2* 34.4*  PLT 58* 66.0* 54* 62*    COAGS:  Recent Labs  11/08/13 1230 12/10/13 2045  02/19/14 1444 08/15/14 1428 09/17/14 1237 10/09/14 0755  INR 1.66* 1.90*  < > 1.67* 1.5* 1.51* 1.57*  APTT 31 37  --   --   --   --   --   < > = values in this interval not displayed.  BMP:  Recent Labs  12/29/13 0605 12/30/13 0620  02/19/14 1444 04/01/14 0845 08/15/14 1428 09/16/14 1555 10/09/14 0755  NA 136* 132*  < > 135* 132* 132*  132* 135 134*  K 4.4 4.2  < > 3.8 4.0 4.0  4.0 4.2 3.9  CL 103 100  < > 104 104 108  108 107 104  CO2 24 24  < > 23  23 21  21 24 21   GLUCOSE 94 95  < > 67* 138* 91  91 68* 103*  BUN 6 7  < > 9 9 8  8  8 14 10   CALCIUM 8.2* 8.1*  < > 8.8 8.7 8.8  8.8 9.0 8.9  CREATININE 0.66 0.65  < > 0.73 0.9 0.8  0.8  0.8 0.9 0.80  GFRNONAA >90 >90  --  >90  --   --   --  >90  GFRAA >90 >90  --  >90  --   --   --  >90  < > = values in this interval not displayed.  LIVER FUNCTION TESTS:  Recent Labs  04/01/14 0845 08/15/14 1428 09/17/14 1238 10/09/14 0755  BILITOT 2.4* 2.5* 1.6* 1.5*  AST 49* 56* 73* 63*  ALT 26 25 35 34  ALKPHOS 113 119* 131* 131*  PROT 7.0 7.1 7.2 7.1  ALBUMIN 2.7* 2.4* 2.9* 2.6*    Assessment and Plan: [place] S/p Mesenteric arteriography performed in [facility] and SMA.Selective right hepatic arteriography performed.Feeding vessels to [facility] identified from right hepatic artery.Selective chemoembolization was performed with DEB-TACE.  Admit overnight for observation and pain  control Discharge in am if stable     Signed: Hedy Jacob 10/09/2014, 4:14 PM

## 2014-10-09 NOTE — Sedation Documentation (Signed)
Patient is resting comfortably.  Dr. Anselm Pancoast aware we are prepped and ready to proceed with procedure.  Pt updated.

## 2014-10-09 NOTE — H&P (Signed)
Chief Complaint: "I am here for my liver treatment."  Referring Physician(s): Dr. Burr Medico  History of Present Illness: Oscar Swanson is a 53 y.o. male with [place], hepatitis C, portal HTN s/p TIPS. He has been in consult 09/24/14 to discuss treatment options of [place] right hepatic mass. The patient denies any interval changes since consult and states he is still drinking alcohol, but has cut back significantly. He admits to occasional abdominal pain and denies any current pain. He denies any N/V, fever or chills. He denies any CP/SOB. He is scheduled today for right hepatic tumor chemoembolization with moderate sedation.   Past Medical History  Diagnosis Date  . Diabetes mellitus without complication   . Schizophrenia     onset age 89.  in and out of psych facilities from age 82 to age 89.  s/p electroconvulsive therapy.  . TB (tuberculosis)     sister says this dx'd at age 61 and treated  . Hepatic encephalopathy   . Esophageal varices   . GI bleed   . Cirrhosis   . Thrombocytopenia   . Alcohol abuse   . Mild Coagulopathy 11/08/2013  . Hepatitis C 12/02/2013  . Smoking 09/23/2013    Past Surgical History  Procedure Laterality Date  . Cataract surgery  Bilateral   . Esophagogastroduodenoscopy N/A 11/08/2013    Procedure: ESOPHAGOGASTRODUODENOSCOPY (EGD);  Surgeon: Milus Banister, MD;  Location: [place];  Service: Endoscopy;  Laterality: N/A;  bedside  . Radiology with anesthesia N/A 12/26/2013    Procedure: Transjugular Intrahepatic Portosystemic Shunt (TIPS);  Surgeon: Carylon Perches, MD;  Location: [place];  Service: Radiology;  Laterality: N/A;    Allergies: Haldol and Oxycodone  Medications: Prior to Admission medications   Medication Sig Start Date End Date Taking? Authorizing Provider  acetaminophen (TYLENOL) 325 MG tablet Take 650 mg by mouth every 6 (six) hours as needed for mild pain or moderate pain.   Yes Historical Provider, MD  beclomethasone (QVAR) 80 MCG/ACT  inhaler Inhale 2 puffs into the lungs 2 (two) times daily.    Yes Historical Provider, MD  folic acid (FOLVITE) 1 MG tablet Take 1 tablet (1 mg total) by mouth daily. 12/02/13  Yes Tresa Garter, MD  furosemide (LASIX) 40 MG tablet Take 1 tablet (40 mg total) by mouth 2 (two) times daily. 08/15/14  Yes Milus Banister, MD  lactulose (CHRONULAC) 10 GM/15ML solution Take 30 mLs (20 g total) by mouth 2 (two) times daily. Take 20 G (30 ml) at breakfast and take 20 G (30 ml) at lunch. 08/15/14  Yes Milus Banister, MD  pantoprazole (PROTONIX) 40 MG tablet Take 1 tablet (40 mg total) by mouth daily. 08/15/14  Yes Milus Banister, MD  rifaximin (XIFAXAN) 550 MG TABS tablet Take 1 tablet (550 mg total) by mouth 2 (two) times daily. 08/15/14  Yes Milus Banister, MD  spironolactone (ALDACTONE) 100 MG tablet Take 1 tablet (100 mg total) by mouth 2 (two) times daily. 08/15/14  Yes Milus Banister, MD  thiamine (VITAMIN B-1) 100 MG tablet Take 100 mg by mouth daily.   Yes Historical Provider, MD  traMADol (ULTRAM) 50 MG tablet Take 1 tablet (50 mg total) by mouth every 6 (six) hours as needed. 09/30/14  Yes Truitt Merle, MD  ENSURE (ENSURE) Take 1 Can by mouth 2 (two) times daily between meals. 09/16/14   Milus Banister, MD  ondansetron (ZOFRAN) 4 MG tablet Take 1 tablet (4 mg  total) by mouth every 6 (six) hours as needed for nausea. 01/27/14   Amy S Esterwood, PA-C  oxyCODONE-acetaminophen (PERCOCET/ROXICET) 5-325 MG per tablet Take 1 tab only per day as needed for pain. Patient not taking: Reported on 09/24/2014 02/10/14   Amy S Esterwood, PA-C    Family History  Problem Relation Age of Onset  . Hypertension Mother   . Colon cancer Neg Hx   . Lung cancer Mother     smoker  . Colon polyps Sister     twin sister    History   Social History  . Marital Status: Single    Spouse Name: N/A    Number of Children: 0  . Years of Education: N/A   Social History Main Topics  . Smoking status: Current Some  Day Smoker -- 0.50 packs/day for 25 years    Types: Cigarettes  . Smokeless tobacco: Never Used     Comment: Tobacco information given 02/10/14  . Alcohol Use: Yes     Comment:  drink beer socially   . Drug Use: No     Comment: not currently  . Sexual Activity: None   Other Topics Concern  . None   Social History Narrative    Review of Systems: A 12 point ROS discussed and pertinent positives are indicated in the HPI above.  All other systems are negative.  Review of Systems  Vital Signs: BP 115/65 mmHg  Pulse 63  Temp(Src) 98.5 F (36.9 C) (Oral)  Resp 18  Ht 5' 8.5" (1.74 m)  Wt 228 lb (103.42 kg)  BMI 34.16 kg/m2  SpO2 100%  Physical Exam  Constitutional: He is oriented to person, place, and time. No distress.  HENT:  Head: Normocephalic and atraumatic.  Neck: No tracheal deviation present.  Cardiovascular: Normal rate and regular rhythm.  Exam reveals no friction rub.   No murmur heard. Pulmonary/Chest: Effort normal and breath sounds normal. No respiratory distress. He has no wheezes. He has no rales.  Abdominal: Soft. Bowel sounds are normal. He exhibits no distension. There is no tenderness.  Musculoskeletal: He exhibits no edema.  Neurological: He is alert and oriented to person, place, and time.  No asterixis   Skin: He is not diaphoretic.  Psychiatric: He has a normal mood and affect. His behavior is normal. Thought content normal.    Imaging: Ct Chest Wo Contrast  09/29/2014   CLINICAL DATA:  Newly diagnosed hepatocellular carcinoma. Rule out metastasis.[place] (hepatocellular carcinoma) C22.0 (ICD-10-CM)  EXAM: CT CHEST WITHOUT CONTRAST  TECHNIQUE: Multidetector CT imaging of the chest was performed following the standard protocol without IV contrast.  COMPARISON:  12/26/2013 plain film.  No prior chest CT.  FINDINGS: Lungs/Pleura:  Minimal motion degradation.  Mild centrilobular emphysema.  Calcified nodularity along the right minor fissure which is likely  due to remote granulomatous disease. Example images 32 and 35 of series 5. There is also subpleural right middle lobe calcified nodularity on sagittal image 22 and transverse image 37. Mild scarring at the left lung base.  No pleural fluid.  Heart/Mediastinum: Small left supraclavicular nodes, without adenopathy. Bilateral gynecomastia, greater on the right. Normal heart size, without pericardial effusion. LAD coronary artery atherosclerosis on image 30. No mediastinal or definite hilar adenopathy, given limitations of unenhanced CT.  Upper Abdomen: Cirrhosis and hepatic steatosis. TIPS catheter. Liver mass not well evaluated. Cholelithiasis.  Bones/Musculoskeletal:  No acute osseous abnormality.  IMPRESSION: 1. Minimal motion degradation. 2. Given this factor, no evidence of metastatic disease  within the chest. 3. Age advanced coronary artery atherosclerosis. Recommend assessment of coronary risk factors and consideration of medical therapy. 4. Gynecomastia.   Electronically Signed   By: Abigail Miyamoto M.D.   On: 09/29/2014 12:29   Nm Bone Scan Whole Body  09/29/2014   CLINICAL DATA:  53 year old male with bilateral leg pain and history of fall 2 months ago. Newly diagnosed hepatocellular cancer. Evaluate for osseous metastatic disease.  EXAM: NUCLEAR MEDICINE WHOLE BODY BONE SCAN  TECHNIQUE: Whole body anterior and posterior images were obtained approximately 3 hours after intravenous injection of radiopharmaceutical.  RADIOPHARMACEUTICALS:  26.8 mCi Technetium-99 MDP  COMPARISON:  CT scan of the chest 09/29/2014; abdominal MRI 08/27/2014  FINDINGS: No discrete focus of asymmetrically increased radiotracer uptake to suggest osseous metastatic disease. Expected excretion within the kidneys and bladder. Mild degenerative level uptake in the bilateral shoulder joints and feet. No focal soft tissue abnormality.  IMPRESSION: Negative for osseous metastatic disease.   Electronically Signed   By: Jacqulynn Cadet M.D.    On: 09/29/2014 14:07    Labs:  CBC:  Recent Labs  02/19/14 1444 08/15/14 1428 09/17/14 1237 10/09/14 0755  WBC 3.2* 4.5 4.5 5.8  HGB 10.7* 12.1* 12.8* 11.8*  HCT 32.4* 36.6* 38.2* 34.4*  PLT 58* 66.0* 54* 62*    COAGS:  Recent Labs  11/08/13 1230 12/10/13 2045  02/19/14 1444 08/15/14 1428 09/17/14 1237 10/09/14 0755  INR 1.66* 1.90*  < > 1.67* 1.5* 1.51* 1.57*  APTT 31 37  --   --   --   --   --   < > = values in this interval not displayed.  BMP:  Recent Labs  12/29/13 0605 12/30/13 0620  02/19/14 1444 04/01/14 0845 08/15/14 1428 09/16/14 1555 10/09/14 0755  NA 136* 132*  < > 135* 132* 132*  132* 135 134*  K 4.4 4.2  < > 3.8 4.0 4.0  4.0 4.2 3.9  CL 103 100  < > 104 104 108  108 107 104  CO2 24 24  < > 23 23 21  21 24 21   GLUCOSE 94 95  < > 67* 138* 91  91 68* 103*  BUN 6 7  < > 9 9 8  8  8 14 10   CALCIUM 8.2* 8.1*  < > 8.8 8.7 8.8  8.8 9.0 8.9  CREATININE 0.66 0.65  < > 0.73 0.9 0.8  0.8  0.8 0.9 0.80  GFRNONAA >90 >90  --  >90  --   --   --  >90  GFRAA >90 >90  --  >90  --   --   --  >90  < > = values in this interval not displayed.  LIVER FUNCTION TESTS:  Recent Labs  04/01/14 0845 08/15/14 1428 09/17/14 1238 10/09/14 0755  BILITOT 2.4* 2.5* 1.6* 1.5*  AST 49* 56* 73* 63*  ALT 26 25 35 34  ALKPHOS 113 119* 131* 131*  PROT 7.0 7.1 7.2 7.1  ALBUMIN 2.7* 2.4* 2.9* 2.6*    TUMOR MARKERS:  Recent Labs  12/23/13 0450 04/01/14 0942 08/15/14 1437 09/17/14 1238  AFPTM 7.6 79.2* 1043.7* 2892.5*    Assessment and Plan: HCC Hepatitis C Portal HTN s/p TIPS Seen in consult 09/24/14 no interval changes, still drinking alcohol.  Scheduled today for right hepatic tumor chemoembolization with moderate sedation Patient has been NPO, no blood thinners taken, afebrile, labs reviewed  Risks and Benefits discussed with the patient. All of the patient's questions  were answered, patient is agreeable to proceed. Consent signed and in  chart. Patient will be admitted overnight for observation and possible D/C home 12/11 Thrombocytopenia    Signed: Hedy Jacob 10/09/2014, 9:37 AM

## 2014-10-10 ENCOUNTER — Other Ambulatory Visit: Payer: Self-pay | Admitting: Radiology

## 2014-10-10 DIAGNOSIS — C220 Liver cell carcinoma: Secondary | ICD-10-CM

## 2014-10-10 LAB — COMPREHENSIVE METABOLIC PANEL
ALT: 37 U/L (ref 0–53)
ANION GAP: 7 (ref 5–15)
AST: 67 U/L — ABNORMAL HIGH (ref 0–37)
Albumin: 2.3 g/dL — ABNORMAL LOW (ref 3.5–5.2)
Alkaline Phosphatase: 126 U/L — ABNORMAL HIGH (ref 39–117)
BILIRUBIN TOTAL: 1.5 mg/dL — AB (ref 0.3–1.2)
BUN: 11 mg/dL (ref 6–23)
CO2: 24 mEq/L (ref 19–32)
Calcium: 8.8 mg/dL (ref 8.4–10.5)
Chloride: 105 mEq/L (ref 96–112)
Creatinine, Ser: 1.03 mg/dL (ref 0.50–1.35)
GFR calc non Af Amer: 82 mL/min — ABNORMAL LOW (ref >90)
GLUCOSE: 98 mg/dL (ref 70–99)
Potassium: 4.6 mEq/L (ref 3.7–5.3)
Sodium: 136 mEq/L — ABNORMAL LOW (ref 137–147)
TOTAL PROTEIN: 6.5 g/dL (ref 6.0–8.3)

## 2014-10-10 LAB — CBC
HEMATOCRIT: 33.8 % — AB (ref 39.0–52.0)
Hemoglobin: 11.3 g/dL — ABNORMAL LOW (ref 13.0–17.0)
MCH: 30.7 pg (ref 26.0–34.0)
MCHC: 33.4 g/dL (ref 30.0–36.0)
MCV: 91.8 fL (ref 78.0–100.0)
Platelets: 57 10*3/uL — ABNORMAL LOW (ref 150–400)
RBC: 3.68 MIL/uL — AB (ref 4.22–5.81)
RDW: 18.7 % — ABNORMAL HIGH (ref 11.5–15.5)
WBC: 5 10*3/uL (ref 4.0–10.5)

## 2014-10-10 MED ORDER — CIPROFLOXACIN HCL 500 MG PO TABS: 500.0000 mg | ORAL_TABLET | Freq: Two times a day (BID) | ORAL | Status: AC

## 2014-10-10 MED ORDER — OXYCODONE HCL 5 MG PO TABS: 5.0000 mg | ORAL_TABLET | ORAL | Status: DC | PRN

## 2014-10-10 MED ORDER — DOCUSATE SODIUM 100 MG PO CAPS: 100.0000 mg | ORAL_CAPSULE | Freq: Two times a day (BID) | ORAL | Status: DC

## 2014-10-10 NOTE — Progress Notes (Signed)
Patient was stable at time of discharge. I removed patient's IV. Hedy Jacob PA-C gave patient his prescriptions. I reviewed discharge education with patient and his wife. They verbalized understanding and had no further questions.

## 2014-10-10 NOTE — Progress Notes (Signed)
Nutrition Brief Note  - Call received from floor that pt being discharged and had questions regarding Ensure Complete. Pt wanted to know where and how he could purchase it. Questions answered.  Laurette Schimke MS, RD, LDN

## 2014-10-10 NOTE — Progress Notes (Signed)
UR completed 

## 2014-10-10 NOTE — Discharge Summary (Signed)
Patient ID: CEJAY CAMBRE MRN: [id number] DOB/AGE: 53-24-1962 53 y.o.  Admit date: 10/09/2014 Discharge date: 10/10/2014  Admission Diagnoses: Hepatocellular carcinoma  Discharge Diagnoses:  Active Problems:   Hepatocellular carcinoma S/p Mesenteric arteriography performed in [facility] and [facility]. Selective right hepatic arteriography performed. Feeding vessels to [facility] identified from right hepatic artery. Selective chemoembolization was performed with DEB-TACE.  Discharged Condition: Good, stable.  Hospital Course: Oscar Swanson is a 53 y.o. male with [place], hepatitis C, portal HTN s/p TIPS. He has been seen in consult 09/24/14 with Dr. Anselm Pancoast to discuss treatment options of [facility] right hepatic mass. He was deemed a candidate for DEB-TACE and he presented on 12/10 as an outpatient to undergo the procedure. He is s/p Mesenteric arteriography in the [facility] and SMA. S/p selective right hepatic arteriography identifying feeding vessels to [facility] from right hepatic artery. S/p selective chemoembolization was performed with DEB-TACE. He was admitted overnight for observation and pain control and did great. His labs and vitals are stable today. He denies any abdominal pain today and c/o right groin site pain today however better than yesterday. He has ate dinner and breakfast without nausea or vomiting. He has urinated on his own without difficulty. He has ambulated the halls without lightheadedness or dizziness. He denies any cp, shortness of breath, active bleeding, fever or chills. He states he is ready for discharge home. He has been seen by Dr. Kathlene Cote and is deemed stable for discharge home. He was given discharge RX and instructions and states understanding. He will follow-up with Dr. Burr Medico as scheduled next week and with Dr. Anselm Pancoast in 2-4 weeks with labs.   Consults: None.  Treatments: Mesenteric arteriography performed in [place] and SMA. Selective right hepatic arteriography performed.  Feeding vessels to [facility] identified from right hepatic artery. Selective chemoembolization was performed with DEB-TACE.  Discharge Exam: Blood pressure 108/58, pulse 65, temperature 97.4 F (36.3 C), temperature source Oral, resp. rate 16, height 5' 8.5" (1.74 m), weight 228 lb (103.42 kg), SpO2 97 %.  PE: General: A&ox3, NAD, sitting up in bed, Herat: RRR without M/G/R Lungs: CTA b/l without w/r/r Abd: NT, ND, SOFT, (+) BS Ext: RCFA access site dressing C/D/I, tender to deep palpation, soft, no signs of infection, bleeding or hematoma, DP 2+ B/L Neuro: Grossly intact, no signs of asterixis  Disposition: 01-Home or Self Care  Discharge Instructions    Call MD for:  difficulty breathing, headache or visual disturbances    Complete by:  As directed      Call MD for:  extreme fatigue    Complete by:  As directed      Call MD for:  hives    Complete by:  As directed      Call MD for:  persistant dizziness or light-headedness    Complete by:  As directed      Call MD for:  persistant nausea and vomiting    Complete by:  As directed      Call MD for:  redness, tenderness, or signs of infection (pain, swelling, redness, odor or green/yellow discharge around incision site)    Complete by:  As directed      Call MD for:  severe uncontrolled pain    Complete by:  As directed      Call MD for:  temperature >100.4    Complete by:  As directed      Diet - low sodium heart healthy    Complete by:  As  directed      Driving Restrictions    Complete by:  As directed   No driving     Increase activity slowly    Complete by:  As directed      Lifting restrictions    Complete by:  As directed   No lifting > 15 lbs x 3 days     Remove dressing in 24 hours    Complete by:  As directed             Medication List    STOP taking these medications        ondansetron 4 MG tablet  Commonly known as:  ZOFRAN     oxyCODONE-acetaminophen 5-325 MG per tablet  Commonly known as:   PERCOCET/ROXICET      TAKE these medications        beclomethasone 80 MCG/ACT inhaler  Commonly known as:  QVAR  Inhale 2 puffs into the lungs 2 (two) times daily.     ciprofloxacin 500 MG tablet  Commonly known as:  CIPRO  Take 1 tablet (500 mg total) by mouth 2 (two) times daily.     docusate sodium 100 MG capsule  Commonly known as:  COLACE  Take 1 capsule (100 mg total) by mouth 2 (two) times daily.     ENSURE  Take 1 Can by mouth 2 (two) times daily between meals.     folic acid 1 MG tablet  Commonly known as:  FOLVITE  Take 1 tablet (1 mg total) by mouth daily.     furosemide 40 MG tablet  Commonly known as:  LASIX  Take 1 tablet (40 mg total) by mouth 2 (two) times daily.     lactulose 10 GM/15ML solution  Commonly known as:  CHRONULAC  Take 30 mLs (20 g total) by mouth 2 (two) times daily. Take 20 G (30 ml) at breakfast and take 20 G (30 ml) at lunch.     oxyCODONE 5 MG immediate release tablet  Commonly known as:  Oxy IR/ROXICODONE  Take 1 tablet (5 mg total) by mouth every 3 (three) hours as needed for moderate pain or severe pain.     pantoprazole 40 MG tablet  Commonly known as:  PROTONIX  Take 1 tablet (40 mg total) by mouth daily.     rifaximin 550 MG Tabs tablet  Commonly known as:  XIFAXAN  Take 1 tablet (550 mg total) by mouth 2 (two) times daily.     spironolactone 100 MG tablet  Commonly known as:  ALDACTONE  Take 1 tablet (100 mg total) by mouth 2 (two) times daily.     thiamine 100 MG tablet  Commonly known as:  VITAMIN B-1  Take 100 mg by mouth daily.     traMADol 50 MG tablet  Commonly known as:  ULTRAM  Take 1 tablet (50 mg total) by mouth every 6 (six) hours as needed.           Follow-up Information    Follow up with Carylon Perches, MD In 3 weeks.   Specialty:  Interventional Radiology   Why:  Our office will call with appointment [phone]   Contact information:   [address] [place] [address] [phone]      The patient will have a CMP drawn at time of follow-up.   Signed: Tsosie Billing PA-C Interventional Radiology  10/10/2014, 11:14 AM  I spent a total of 45 minutes face to face in clinical consultation,  greater than 50% of which was counseling/coordinating care for hepatocellular carcinoma.

## 2014-10-16 ENCOUNTER — Encounter: Payer: Self-pay | Admitting: Hematology

## 2014-10-16 ENCOUNTER — Other Ambulatory Visit (HOSPITAL_BASED_OUTPATIENT_CLINIC_OR_DEPARTMENT_OTHER): Payer: Medicaid Other

## 2014-10-16 ENCOUNTER — Telehealth: Payer: Self-pay | Admitting: Hematology

## 2014-10-16 ENCOUNTER — Ambulatory Visit: Payer: Medicaid Other | Admitting: Nutrition

## 2014-10-16 ENCOUNTER — Ambulatory Visit (HOSPITAL_BASED_OUTPATIENT_CLINIC_OR_DEPARTMENT_OTHER): Payer: Medicaid Other | Admitting: Hematology

## 2014-10-16 VITALS — BP 155/66 | HR 72 | Temp 97.7°F | Resp 18 | Ht 68.5 in | Wt 226.4 lb

## 2014-10-16 DIAGNOSIS — C220 Liver cell carcinoma: Secondary | ICD-10-CM

## 2014-10-16 DIAGNOSIS — K7290 Hepatic failure, unspecified without coma: Secondary | ICD-10-CM

## 2014-10-16 DIAGNOSIS — D689 Coagulation defect, unspecified: Secondary | ICD-10-CM

## 2014-10-16 DIAGNOSIS — B1920 Unspecified viral hepatitis C without hepatic coma: Secondary | ICD-10-CM

## 2014-10-16 DIAGNOSIS — K7031 Alcoholic cirrhosis of liver with ascites: Secondary | ICD-10-CM

## 2014-10-16 LAB — CBC WITH DIFFERENTIAL/PLATELET
BASO%: 0.7 % (ref 0.0–2.0)
Basophils Absolute: 0.1 10*3/uL (ref 0.0–0.1)
EOS ABS: 0.1 10*3/uL (ref 0.0–0.5)
EOS%: 1.3 % (ref 0.0–7.0)
HEMATOCRIT: 40.1 % (ref 38.4–49.9)
HGB: 13.1 g/dL (ref 13.0–17.1)
LYMPH#: 1.1 10*3/uL (ref 0.9–3.3)
LYMPH%: 13.1 % — AB (ref 14.0–49.0)
MCH: 30.8 pg (ref 27.2–33.4)
MCHC: 32.6 g/dL (ref 32.0–36.0)
MCV: 94.4 fL (ref 79.3–98.0)
MONO#: 1.3 10*3/uL — ABNORMAL HIGH (ref 0.1–0.9)
MONO%: 15 % — ABNORMAL HIGH (ref 0.0–14.0)
NEUT#: 5.8 10*3/uL (ref 1.5–6.5)
NEUT%: 69.9 % (ref 39.0–75.0)
Platelets: 69 10*3/uL — ABNORMAL LOW (ref 140–400)
RBC: 4.25 10*6/uL (ref 4.20–5.82)
RDW: 18.6 % — ABNORMAL HIGH (ref 11.0–14.6)
WBC: 8.3 10*3/uL (ref 4.0–10.3)

## 2014-10-16 LAB — COMPREHENSIVE METABOLIC PANEL (CC13)
ALK PHOS: 150 U/L (ref 40–150)
ALT: 57 U/L — AB (ref 0–55)
AST: 87 U/L — ABNORMAL HIGH (ref 5–34)
Albumin: 2.7 g/dL — ABNORMAL LOW (ref 3.5–5.0)
Anion Gap: 7 mEq/L (ref 3–11)
BUN: 14.4 mg/dL (ref 7.0–26.0)
CHLORIDE: 102 meq/L (ref 98–109)
CO2: 22 mEq/L (ref 22–29)
Calcium: 8.8 mg/dL (ref 8.4–10.4)
Creatinine: 0.9 mg/dL (ref 0.7–1.3)
EGFR: >90 mL/min/{1.73_m2} (ref >90)
Glucose: 127 mg/dl (ref 70–140)
POTASSIUM: 4.3 meq/L (ref 3.5–5.1)
Sodium: 131 mEq/L — ABNORMAL LOW (ref 136–145)
Total Bilirubin: 2.21 mg/dL — ABNORMAL HIGH (ref 0.20–1.20)
Total Protein: 7.5 g/dL (ref 6.4–8.3)

## 2014-10-16 LAB — PROTIME-INR
INR: 1.4 — ABNORMAL LOW (ref 2.00–3.50)
PROTIME: 16.8 s — AB (ref 10.6–13.4)

## 2014-10-16 NOTE — Telephone Encounter (Signed)
Gave avs & cal for March 2016. °

## 2014-10-16 NOTE — Progress Notes (Signed)
53 year old male diagnosed with hepatocellular cancer.  He is a patient of Dr. Burr Medico.  Past medical history includes diabetes, schizophrenia, TB, hepatic encephalopathy, esophageal varices, cirrhosis, alcohol intake, hepatitis C and tobacco usage.  Medications include Folvite, Lasix, lactulose, Zofran, Protionix, and vitamin B1.  Labs include sodium 131, and albumin 2.7.  Height: 68.5 inches. Weight: 226.4 pounds. Usual body weight: 190 pounds June 2015. BMI: 33.92.  Patient presents to nutrition consultation.   He reports good appetite.  Denies nutrition impact symptoms at this time.   Patient is requesting assistance with obtaining Ensure oral nutrition supplements.  Nutrition diagnosis: Food nutrition related knowledge deficit related to new diagnosis of hepatocellular cancer as evidenced by no prior need for nutrition related information.  Intervention: Patient educated to consume small frequent meals and snacks with high protein foods. Recommended patient consume Ensure Plus only if appetite decreases and he is unable to eat regular foods. Provided patient with one complementary case of Ensure Plus along with coupons for purchase. Provided patient with fact sheet on increasing protein foods. Questions were answered.  Teach back method used.  Contact information was given.  Monitoring, evaluation, goals: Patient will tolerate adequate calories and protein to minimize weight gain throughout treatment.  Next visit: Will be scheduled as needed.  **Disclaimer: This note was dictated with voice recognition software. Similar sounding words can inadvertently be transcribed and this note may contain transcription errors which may not have been corrected upon publication of note.**

## 2014-10-16 NOTE — Progress Notes (Signed)
[place] OFFICE PROGRESS NOTE  Patient Care Team: Tresa Garter, MD as PCP - General (Internal Medicine)  SUMMARY OF ONCOLOGIC HISTORY:   [place] (hepatocellular carcinoma) T1N0M0 stage I   08/27/2014 Initial Diagnosis HCC (hepatocellular carcinoma)   08/27/2014 Imaging liver MRI W/WO iv contrast showed liver cirrhosis and a 4.9cm mass in right lobe liver, seg 7, with typical image characteristics of [place]. also found a 1.9cm hemangioma in segment 3 of liver.  AFP was 1044 on 08/15/14.    OTHER RELATED ISSUES: 1. Untreated Hep C 2. Liver cirrhosis secondary to hepatitis C and alcohol abuse, with portal hypertension, ascites s/p TIP, history of hepatic encephalopathy in January 2014. Child-Pugh score is 10 (class C)  INTERVAL HISTORY: Mr. Denomme returns for follow-up. He did TACE on 12/10, and tolerated it very well. He denies any pain, fever or any other new symptoms.   REVIEW OF SYSTEMS:   Constitutional: Denies fevers, chills or abnormal weight loss, (+) fatigue  Eyes: Denies blurriness of vision Ears, nose, mouth, throat, and face: Denies mucositis or sore throat Respiratory: Denies cough, dyspnea or wheezes Cardiovascular: Denies palpitation, chest discomfort or lower extremity swelling Gastrointestinal:  Denies nausea, heartburn or change in bowel habits, (+) bdominal distention Skin: Denies abnormal skin rashes Lymphatics: Denies new lymphadenopathy or easy bruising Neurological:Denies numbness, tingling or new weaknesses Behavioral/Psych: Mood is stable, no new changes  All other systems were reviewed with the patient and are negative.  MEDICAL HISTORY:  Past Medical History  Diagnosis Date  . Diabetes mellitus without complication   . Schizophrenia     onset age 66.  in and out of psych facilities from age 36 to age 63.  s/p electroconvulsive therapy.  . TB (tuberculosis)     sister says this dx'd at age 37 and treated  . Hepatic encephalopathy   .  Esophageal varices   . GI bleed   . Cirrhosis   . Thrombocytopenia   . Alcohol abuse   . Mild Coagulopathy 11/08/2013  . Hepatitis C 12/02/2013  . Smoking 09/23/2013    SURGICAL HISTORY: Past Surgical History  Procedure Laterality Date  . Cataract surgery  Bilateral   . Esophagogastroduodenoscopy N/A 11/08/2013    Procedure: ESOPHAGOGASTRODUODENOSCOPY (EGD);  Surgeon: Milus Banister, MD;  Location: [facility];  Service: Endoscopy;  Laterality: N/A;  bedside  . Radiology with anesthesia N/A 12/26/2013    Procedure: Transjugular Intrahepatic Portosystemic Shunt (TIPS);  Surgeon: Carylon Perches, MD;  Location: [facility];  Service: Radiology;  Laterality: N/A;    I have reviewed the social history and family history with the patient and they are unchanged from previous note.  ALLERGIES:  is allergic to haldol and oxycodone.  MEDICATIONS:  Current Outpatient Prescriptions  Medication Sig Dispense Refill  . beclomethasone (QVAR) 80 MCG/ACT inhaler Inhale 2 puffs into the lungs 2 (two) times daily.     Marland Kitchen docusate sodium (COLACE) 100 MG capsule Take 1 capsule (100 mg total) by mouth 2 (two) times daily. 20 capsule 0  . folic acid (FOLVITE) 1 MG tablet Take 1 tablet (1 mg total) by mouth daily. 90 tablet 3  . furosemide (LASIX) 40 MG tablet Take 1 tablet (40 mg total) by mouth 2 (two) times daily. 30 tablet 6  . lactulose (CHRONULAC) 10 GM/15ML solution Take 30 mLs (20 g total) by mouth 2 (two) times daily. Take 20 G (30 ml) at breakfast and take 20 G (30 ml) at lunch. 240 mL 3  .  oxyCODONE (OXY IR/ROXICODONE) 5 MG immediate release tablet Take 1 tablet (5 mg total) by mouth every 3 (three) hours as needed for moderate pain or severe pain. 30 tablet 0  . pantoprazole (PROTONIX) 40 MG tablet Take 1 tablet (40 mg total) by mouth daily. 30 tablet 6  . rifaximin (XIFAXAN) 550 MG TABS tablet Take 1 tablet (550 mg total) by mouth 2 (two) times daily. 60 tablet 6  . spironolactone (ALDACTONE) 100 MG  tablet Take 1 tablet (100 mg total) by mouth 2 (two) times daily. 60 tablet 6  . thiamine (VITAMIN B-1) 100 MG tablet Take 100 mg by mouth daily.    . traMADol (ULTRAM) 50 MG tablet Take 1 tablet (50 mg total) by mouth every 6 (six) hours as needed. 30 tablet 0  . ALPRAZolam (XANAX) 0.5 MG tablet   0  . ENSURE (ENSURE) Take 1 Can by mouth 2 (two) times daily between meals. (Patient not taking: Reported on 10/16/2014) 237 mL 12   No current facility-administered medications for this visit.    PHYSICAL EXAMINATION: ECOG PERFORMANCE STATUS: 1 - Symptomatic but completely ambulatory  Filed Vitals:   10/16/14 0817  BP: 155/66  Pulse: 72  Temp: 97.7 F (36.5 C)  Resp: 18   Filed Weights   10/16/14 0817  Weight: 226 lb 6.4 oz (102.694 kg)    GENERAL:alert, no distress and comfortable SKIN: skin color, texture, turgor are normal, no rashes or significant lesions EYES: normal, Conjunctiva are pink and non-injected, sclera clear OROPHARYNX:no exudate, no erythema and lips, buccal mucosa, and tongue normal  NECK: supple, thyroid normal size, non-tender, without nodularity LYMPH:  no palpable lymphadenopathy in the cervical, axillary or inguinal LUNGS: clear to auscultation and percussion with normal breathing effort HEART: regular rate & rhythm and no murmurs and no lower extremity edema ABDOMEN:abdomen soft, non-tender and normal bowel sounds Musculoskeletal:no cyanosis of digits and no clubbing  NEURO: alert & oriented x 3 with fluent speech, no focal motor/sensory deficits  LABORATORY DATA:  I have reviewed the data as listed    Component Value Date/Time   NA 136* 10/10/2014 0501   K 4.6 10/10/2014 0501   CL 105 10/10/2014 0501   CO2 24 10/10/2014 0501   GLUCOSE 98 10/10/2014 0501   BUN 11 10/10/2014 0501   CREATININE 1.03 10/10/2014 0501   CALCIUM 8.8 10/10/2014 0501   PROT 6.5 10/10/2014 0501   ALBUMIN 2.3* 10/10/2014 0501   AST 67* 10/10/2014 0501   ALT 37 10/10/2014  0501   ALKPHOS 126* 10/10/2014 0501   BILITOT 1.5* 10/10/2014 0501   GFRNONAA 82* 10/10/2014 0501   GFRAA >90 10/10/2014 0501    No results found for: SPEP, UPEP  Lab Results  Component Value Date   WBC 8.3 10/16/2014   NEUTROABS 5.8 10/16/2014   HGB 13.1 10/16/2014   HCT 40.1 10/16/2014   MCV 94.4 10/16/2014   PLT 69* 10/16/2014      Chemistry      Component Value Date/Time   NA 136* 10/10/2014 0501   K 4.6 10/10/2014 0501   CL 105 10/10/2014 0501   CO2 24 10/10/2014 0501   BUN 11 10/10/2014 0501   CREATININE 1.03 10/10/2014 0501      Component Value Date/Time   CALCIUM 8.8 10/10/2014 0501   ALKPHOS 126* 10/10/2014 0501   AST 67* 10/10/2014 0501   ALT 37 10/10/2014 0501   BILITOT 1.5* 10/10/2014 0501       RADIOGRAPHIC STUDIES: IR 10/09/2014  EXAM: 1. ULTRASOUND GUIDANCE FOR VASCULAR ACCESS OF THE RIGHT COMMON FEMORAL ARTERY 2. MESENTERIC ARTERIOGRAPHY WITH SELECTIVE CATHETERIZATION OF THE CELIAC AXIS AND SUPERIOR MESENTERIC ARTERY 3. SELECTIVE CATHETERIZATION AND ANGIOGRAPHY OF THE RIGHT HEPATIC ARTERY AND THREE BRANCHES OF THE RIGHT HEPATIC ARTERY. 4. TRANSCATHETER CHEMOEMBOLIZATION WITH DRUG-ELUTING BEADS VIA A RIGHT HEPATIC ARTERY BRANCH 5. FOLLOW-UP ANGIOGRAPHY AFTER CHEMOEMBOLIZATION Physician: Stephan Minister. Anselm Pancoast, MD  ASSESSMENT & PLAN:  Mr. Shallenberger is a 53 year old Caucasian male with untreated hepatitis C, liver cirrhosis with portal hypertension, ascites status post TIP, history of GI bleeding problems and hepatic encephalopathy. His Child-Pugh score is 10 (class C), although he recovers pretty well with medical management for his liver cirrhosis. He has a 4.9 cm liver mass with typical image findings consistent with HCC, and elevated AFP at 1044, so his diagnosis of [place] is quite certain. CT chest and bone scan was negative for distant mets, which I reviewed with him today. He has T1N0M0 stage I HCC, s/p TACE on 10/09/2014  Plan -he tolerated the procedure  well, CBC stable today, CMP still pending  -Repeat liver MRI in 3 months to evaluate his response to tumor embolization -I encouraged him to quit alcohol drinking completely, he agrees to try. -He will continue follow-up with Dr. Ardis Hughs for his liver cirrhosis management. -consider liver transplant evaluation if he is able to quit drinking alcohol completely   RTC in 3 month with lab and liver MRI   I spent 15 minutes counseling the patient face to face. The total time spent in the appointment was 25 minutes and more than 50% was on counseling and review of test results       Truitt Merle, MD 10/16/2014 8:37 AM

## 2014-10-21 ENCOUNTER — Telehealth: Payer: Self-pay | Admitting: *Deleted

## 2014-10-21 NOTE — Telephone Encounter (Signed)
Received message from [facility] radiology scheduler [place] that pt has upcoming MRI scheduled for March 2016.  Pt is claustrophobic and wished to have procedure done at [facility].  Spoke with [facility].  Earnest Bailey stated pt will be scheduled and will be contacted with appt date and time. [facility]  Phone    [phone].

## 2014-10-29 ENCOUNTER — Telehealth: Payer: Self-pay | Admitting: Nutrition

## 2014-10-29 NOTE — Telephone Encounter (Signed)
Provided patient with second complimentary case of Ensure Plus. Patient requesting follow-up appointment.  I scheduled this for him.  Patient was appreciative.

## 2014-11-05 ENCOUNTER — Telehealth: Payer: Self-pay | Admitting: *Deleted

## 2014-11-05 NOTE — Telephone Encounter (Signed)
Received vm call from pt asking for refill on his oxycodone, water pills & antibiotic which he state is doing more for him than anything else. Pt called back & he needs oxycodone.  Instructed to call Dr Ardis Hughs about his diuretics & should have refills.  He states ATB was cipro 500 bid & states he doesn't feel as sick while taking this.  He reports that this was given to him by Dr Burr Medico.  He reports that he has been having some fever 100-101 last hs & 99 this am & has been taking Ultram & drinking a lot of ice water.  Will check with Dr Burr Medico. Called pt back & encouraged pt to notify his PCP or Dr Ardis Hughs about fever & med refills.

## 2014-11-13 ENCOUNTER — Ambulatory Visit: Payer: Medicaid Other | Admitting: Nutrition

## 2014-11-13 NOTE — Progress Notes (Signed)
Nutrition follow-up completed with patient and sister.   Patient reports good appetite and good oral intake. He reports occasional nausea but no vomiting. He is compliant with taking nausea medication.  Patient denies diarrhea or constipation. He is consuming one Ensure Plus daily along with breakfast lunch and dinner. Weight today documented as 227.2 pounds up slightly from 226.4 pounds December 17. Patient states he drinks 3-4 8 ounce glasses of water daily. Patient would like reassurance that he is eating adequately.  Nutrition diagnosis: Food and nutrition related knowledge deficit improved.  Intervention: Educated patient to continue high protein foods at breakfast lunch and dinner.    He can continue to drink one Ensure Plus daily. Provided patient with second complimentary case of Ensure Plus. Also provided patient with coupons. Recommended patient increase water to 8 - 8 ounce glasses daily for a total of 64 ounces daily. Questions were answered.  Teach back method used.  Monitoring, evaluation, goals: Patient will continue adequate calories and protein for weight maintenance.  He will increase water intake.  Next visit: To be scheduled as needed.  **Disclaimer: This note was dictated with voice recognition software. Similar sounding words can inadvertently be transcribed and this note may contain transcription errors which may not have been corrected upon publication of note.**

## 2014-12-17 ENCOUNTER — Ambulatory Visit: Payer: Medicaid Other | Admitting: Gastroenterology

## 2015-01-05 ENCOUNTER — Telehealth: Payer: Self-pay | Admitting: Hematology

## 2015-01-05 NOTE — Telephone Encounter (Signed)
Left message to confirm appointment changed to 03/16. Mailed calendar.

## 2015-01-13 ENCOUNTER — Other Ambulatory Visit: Payer: Self-pay | Admitting: *Deleted

## 2015-01-13 DIAGNOSIS — C220 Liver cell carcinoma: Secondary | ICD-10-CM

## 2015-01-14 ENCOUNTER — Encounter: Payer: Self-pay | Admitting: Hematology

## 2015-01-14 ENCOUNTER — Ambulatory Visit (HOSPITAL_BASED_OUTPATIENT_CLINIC_OR_DEPARTMENT_OTHER): Payer: Medicaid Other | Admitting: Hematology

## 2015-01-14 ENCOUNTER — Telehealth: Payer: Self-pay | Admitting: Hematology

## 2015-01-14 ENCOUNTER — Other Ambulatory Visit (HOSPITAL_BASED_OUTPATIENT_CLINIC_OR_DEPARTMENT_OTHER): Payer: Medicaid Other

## 2015-01-14 VITALS — BP 141/63 | HR 68 | Temp 98.2°F | Resp 18 | Ht 68.5 in | Wt 233.2 lb

## 2015-01-14 DIAGNOSIS — C220 Liver cell carcinoma: Secondary | ICD-10-CM

## 2015-01-14 DIAGNOSIS — B1920 Unspecified viral hepatitis C without hepatic coma: Secondary | ICD-10-CM

## 2015-01-14 DIAGNOSIS — Z720 Tobacco use: Secondary | ICD-10-CM

## 2015-01-14 DIAGNOSIS — E119 Type 2 diabetes mellitus without complications: Secondary | ICD-10-CM

## 2015-01-14 LAB — CBC WITH DIFFERENTIAL/PLATELET
BASO%: 1.2 % (ref 0.0–2.0)
Basophils Absolute: 0 10*3/uL (ref 0.0–0.1)
EOS%: 3.2 % (ref 0.0–7.0)
Eosinophils Absolute: 0.1 10*3/uL (ref 0.0–0.5)
HCT: 41.8 % (ref 38.4–49.9)
HGB: 13.4 g/dL (ref 13.0–17.1)
LYMPH%: 31 % (ref 14.0–49.0)
MCH: 30.4 pg (ref 27.2–33.4)
MCHC: 32.1 g/dL (ref 32.0–36.0)
MCV: 94.6 fL (ref 79.3–98.0)
MONO#: 0.6 10*3/uL (ref 0.1–0.9)
MONO%: 16.2 % — AB (ref 0.0–14.0)
NEUT%: 48.4 % (ref 39.0–75.0)
NEUTROS ABS: 1.8 10*3/uL (ref 1.5–6.5)
Platelets: 66 10*3/uL — ABNORMAL LOW (ref 140–400)
RBC: 4.42 10*6/uL (ref 4.20–5.82)
RDW: 19.8 % — AB (ref 11.0–14.6)
WBC: 3.7 10*3/uL — ABNORMAL LOW (ref 4.0–10.3)
lymph#: 1.1 10*3/uL (ref 0.9–3.3)

## 2015-01-14 LAB — COMPREHENSIVE METABOLIC PANEL (CC13)
ALT: 35 U/L (ref 0–55)
AST: 100 U/L — ABNORMAL HIGH (ref 5–34)
Albumin: 2.5 g/dL — ABNORMAL LOW (ref 3.5–5.0)
Alkaline Phosphatase: 136 U/L (ref 40–150)
Anion Gap: 6 mEq/L (ref 3–11)
BUN: 11.5 mg/dL (ref 7.0–26.0)
CO2: 21 meq/L — AB (ref 22–29)
Calcium: 8.8 mg/dL (ref 8.4–10.4)
Chloride: 110 mEq/L — ABNORMAL HIGH (ref 98–109)
Creatinine: 0.8 mg/dL (ref 0.7–1.3)
EGFR: >90 mL/min/{1.73_m2} (ref >90)
Glucose: 118 mg/dl (ref 70–140)
Potassium: 4 mEq/L (ref 3.5–5.1)
SODIUM: 137 meq/L (ref 136–145)
TOTAL PROTEIN: 6.9 g/dL (ref 6.4–8.3)
Total Bilirubin: 2.45 mg/dL — ABNORMAL HIGH (ref 0.20–1.20)

## 2015-01-14 LAB — PROTIME-INR
INR: 1.4 — AB (ref 2.00–3.50)
Protime: 16.8 Seconds — ABNORMAL HIGH (ref 10.6–13.4)

## 2015-01-14 MED ORDER — OXYCODONE HCL 5 MG PO TABS: 5.0000 mg | ORAL_TABLET | ORAL | Status: DC | PRN

## 2015-01-14 NOTE — Telephone Encounter (Signed)
Gave avs & calendar for September °

## 2015-01-14 NOTE — Progress Notes (Signed)
[facility] OFFICE PROGRESS NOTE  Patient Care Team: Tresa Garter, MD as PCP - General (Internal Medicine)  SUMMARY OF ONCOLOGIC HISTORY:   [place] (hepatocellular carcinoma) T1N0M0 stage I   08/27/2014 Initial Diagnosis HCC (hepatocellular carcinoma)   08/27/2014 Imaging liver MRI W/WO iv contrast showed liver cirrhosis and a 4.9cm mass in right lobe liver, seg 7, with typical image characteristics of [place]. also found a 1.9cm hemangioma in segment 3 of liver.  AFP was 1044 on 08/15/14.    OTHER RELATED ISSUES: 1. Untreated Hep C 2. Liver cirrhosis secondary to hepatitis C and alcohol abuse, with portal hypertension, ascites s/p TIP, history of hepatic encephalopathy in January 2014. Child-Pugh score is 10 (class C)  INTERVAL HISTORY: [name] returns for follow-up. He did TACE on 12/10, and feels overall improved after the procedure. His abdominal pain is better, he has chronic back pain which is chronic and stable. He doesn't need chronic opiates (oxycodone ) for his back pain. He is to get steroid injection from a orthopedics surgeon before, but his surgeon retired. He has fair appetite and energy level. No other new complaint.  REVIEW OF SYSTEMS:   Constitutional: Denies fevers, chills or abnormal weight loss, (+) fatigue  Eyes: Denies blurriness of vision Ears, nose, mouth, throat, and face: Denies mucositis or sore throat Respiratory: Denies cough, dyspnea or wheezes Cardiovascular: Denies palpitation, chest discomfort or lower extremity swelling Gastrointestinal:  Denies nausea, heartburn or change in bowel habits, (+) bdominal distention Skin: Denies abnormal skin rashes Lymphatics: Denies new lymphadenopathy or easy bruising Neurological:Denies numbness, tingling or new weaknesses Behavioral/Psych: Mood is stable, no new changes  All other systems were reviewed with the patient and are negative.  MEDICAL HISTORY:  Past Medical History  Diagnosis Date  .  Diabetes mellitus without complication   . Schizophrenia     onset age 4.  in and out of psych facilities from age 81 to age 63.  s/p electroconvulsive therapy.  . TB (tuberculosis)     sister says this dx'd at age 89 and treated  . Hepatic encephalopathy   . Esophageal varices   . GI bleed   . Cirrhosis   . Thrombocytopenia   . Alcohol abuse   . Mild Coagulopathy 11/08/2013  . Hepatitis C 12/02/2013  . Smoking 09/23/2013    SURGICAL HISTORY: Past Surgical History  Procedure Laterality Date  . Cataract surgery  Bilateral   . Esophagogastroduodenoscopy N/A 11/08/2013    Procedure: ESOPHAGOGASTRODUODENOSCOPY (EGD);  Surgeon: Milus Banister, MD;  Location: [facility];  Service: Endoscopy;  Laterality: N/A;  bedside  . Radiology with anesthesia N/A 12/26/2013    Procedure: Transjugular Intrahepatic Portosystemic Shunt (TIPS);  Surgeon: Carylon Perches, MD;  Location: [facility];  Service: Radiology;  Laterality: N/A;    I have reviewed the social history and family history with the patient and they are unchanged from previous note.  ALLERGIES:  is allergic to haldol and oxycodone.  MEDICATIONS:  Current Outpatient Prescriptions  Medication Sig Dispense Refill  . ALPRAZolam (XANAX) 0.5 MG tablet   0  . beclomethasone (QVAR) 80 MCG/ACT inhaler Inhale 2 puffs into the lungs 2 (two) times daily.     Marland Kitchen docusate sodium (COLACE) 100 MG capsule Take 1 capsule (100 mg total) by mouth 2 (two) times daily. 20 capsule 0  . ENSURE (ENSURE) Take 1 Can by mouth 2 (two) times daily between meals. (Patient not taking: Reported on 10/16/2014) 237 mL 12  .  folic acid (FOLVITE) 1 MG tablet Take 1 tablet (1 mg total) by mouth daily. 90 tablet 3  . furosemide (LASIX) 40 MG tablet Take 1 tablet (40 mg total) by mouth 2 (two) times daily. 30 tablet 6  . lactulose (CHRONULAC) 10 GM/15ML solution Take 30 mLs (20 g total) by mouth 2 (two) times daily. Take 20 G (30 ml) at breakfast and take 20 G (30 ml) at lunch. 240  mL 3  . oxyCODONE (OXY IR/ROXICODONE) 5 MG immediate release tablet Take 1 tablet (5 mg total) by mouth every 3 (three) hours as needed for moderate pain or severe pain. 30 tablet 0  . pantoprazole (PROTONIX) 40 MG tablet Take 1 tablet (40 mg total) by mouth daily. 30 tablet 6  . rifaximin (XIFAXAN) 550 MG TABS tablet Take 1 tablet (550 mg total) by mouth 2 (two) times daily. 60 tablet 6  . spironolactone (ALDACTONE) 100 MG tablet Take 1 tablet (100 mg total) by mouth 2 (two) times daily. 60 tablet 6  . thiamine (VITAMIN B-1) 100 MG tablet Take 100 mg by mouth daily.    . traMADol (ULTRAM) 50 MG tablet Take 1 tablet (50 mg total) by mouth every 6 (six) hours as needed. 30 tablet 0   No current facility-administered medications for this visit.    PHYSICAL EXAMINATION: ECOG PERFORMANCE STATUS: 1 - Symptomatic but completely ambulatory  Filed Vitals:   01/14/15 1414  BP: 141/63  Pulse: 68  Temp: 98.2 F (36.8 C)  Resp: 18   Filed Weights   01/14/15 1414  Weight: 233 lb 3.2 oz (105.779 kg)    GENERAL:alert, no distress and comfortable SKIN: skin color, texture, turgor are normal, no rashes or significant lesions EYES: normal, Conjunctiva are pink and non-injected, sclera clear OROPHARYNX:no exudate, no erythema and lips, buccal mucosa, and tongue normal  NECK: supple, thyroid normal size, non-tender, without nodularity LYMPH:  no palpable lymphadenopathy in the cervical, axillary or inguinal LUNGS: clear to auscultation and percussion with normal breathing effort HEART: regular rate & rhythm and no murmurs and no lower extremity edema ABDOMEN:abdomen soft, mildly distended, non-tender,  and normal bowel sounds Musculoskeletal:no cyanosis of digits and no clubbing  NEURO: alert & oriented x 3 with fluent speech, no focal motor/sensory deficits  LABORATORY DATA:  I have reviewed the data as listed CBC Latest Ref Rng 01/14/2015 10/16/2014 10/10/2014  WBC 4.0 - 10.3 10e3/uL 3.7(L)  8.3 5.0  Hemoglobin 13.0 - 17.1 g/dL 13.4 13.1 11.3(L)  Hematocrit 38.4 - 49.9 % 41.8 40.1 33.8(L)  Platelets 140 - 400 10e3/uL 66(L) 69(L) 57(L)    CMP Latest Ref Rng 01/14/2015 10/16/2014 10/10/2014  Glucose 70 - 140 mg/dl 118 127 98  BUN 7.0 - 26.0 mg/dL 11.5 14.4 11  Creatinine 0.7 - 1.3 mg/dL 0.8 0.9 1.03  Sodium 136 - 145 mEq/L 137 131(L) 136(L)  Potassium 3.5 - 5.1 mEq/L 4.0 4.3 4.6  Chloride 96 - 112 mEq/L - - 105  CO2 22 - 29 mEq/L 21(L) 22 24  Calcium 8.4 - 10.4 mg/dL 8.8 8.8 8.8  Total Protein 6.4 - 8.3 g/dL 6.9 7.5 6.5  Total Bilirubin 0.20 - 1.20 mg/dL 2.45(H) 2.21(H) 1.5(H)  Alkaline Phos 40 - 150 U/L 136 150 126(H)  AST 5 - 34 U/L 100(H) 87(H) 67(H)  ALT 0 - 55 U/L 35 57(H) 37     RADIOGRAPHIC STUDIES: IR 10/09/2014 EXAM: 1. ULTRASOUND GUIDANCE FOR VASCULAR ACCESS OF THE RIGHT COMMON FEMORAL ARTERY 2. MESENTERIC ARTERIOGRAPHY WITH  SELECTIVE CATHETERIZATION OF THE CELIAC AXIS AND SUPERIOR MESENTERIC ARTERY 3. SELECTIVE CATHETERIZATION AND ANGIOGRAPHY OF THE RIGHT HEPATIC ARTERY AND THREE BRANCHES OF THE RIGHT HEPATIC ARTERY. 4. TRANSCATHETER CHEMOEMBOLIZATION WITH DRUG-ELUTING BEADS VIA A RIGHT HEPATIC ARTERY BRANCH 5. FOLLOW-UP ANGIOGRAPHY AFTER CHEMOEMBOLIZATION Physician: Stephan Minister. Anselm Pancoast, MD  ASSESSMENT & PLAN:  Mr. Cush is a 54 year old Caucasian male with untreated hepatitis C, liver cirrhosis with portal hypertension, ascites status post TIP, history of GI bleeding problems and hepatic encephalopathy. His Child-Pugh score is 10 (class C), although he recovers pretty well with medical management for his liver cirrhosis. He has a 4.9 cm liver mass with typical image findings consistent with HCC, and elevated AFP at 1044, so his diagnosis of [place] is quite certain. CT chest and bone scan was negative for distant mets, which I reviewed with him today. He has T1N0M0 stage I HCC, s/p TACE on 10/09/2014  Plan -He is currently doing well after TACE 3 months ago, with  some symptoms improvement. -He was supposed to get a MRI before this visit but wasn't done. I'll scheduled for the next few weeks. I'll present his case in our GI tumor Board after his restaging liver scan. -His liver function bilirubin slightly got worse after TACE, stable. -I encouraged him to quit alcohol drinking completely, he agrees to try. -He will continue follow-up with Dr. Ardis Hughs for his liver cirrhosis management, next appointment due in April. -consider liver transplant evaluation if he is able to quit drinking alcohol completely   RTC in 6 month with lab and liver MRI  I refilled his oxycodone for 60 pills today. He is going to find a new orthopedic surgeon for his back patient.  I spent 15 minutes counseling the patient face to face. The total time spent in the appointment was 25 minutes and more than 50% was on counseling and review of test results       Truitt Merle, MD 01/14/2015 2:39 PM

## 2015-01-15 ENCOUNTER — Ambulatory Visit: Payer: Medicaid Other | Admitting: Hematology

## 2015-01-15 ENCOUNTER — Other Ambulatory Visit: Payer: Medicaid Other

## 2015-01-15 LAB — AFP TUMOR MARKER: AFP-Tumor Marker: 21290 ng/mL — ABNORMAL HIGH (ref <6.1)

## 2015-01-20 ENCOUNTER — Telehealth: Payer: Self-pay | Admitting: Hematology

## 2015-01-20 NOTE — Telephone Encounter (Signed)
Left message to confirm appointment changed for Septemeber 21. Mailed calendar

## 2015-02-05 ENCOUNTER — Telehealth: Payer: Self-pay | Admitting: Internal Medicine

## 2015-02-05 NOTE — Telephone Encounter (Signed)
Pt needs referral to Dr Ardis Hughs at [facility], pt has appt on 02/17/15.

## 2015-02-06 ENCOUNTER — Other Ambulatory Visit: Payer: Self-pay | Admitting: Internal Medicine

## 2015-02-06 DIAGNOSIS — K7031 Alcoholic cirrhosis of liver with ascites: Secondary | ICD-10-CM

## 2015-02-17 ENCOUNTER — Ambulatory Visit (INDEPENDENT_AMBULATORY_CARE_PROVIDER_SITE_OTHER): Payer: Medicaid Other | Admitting: Gastroenterology

## 2015-02-17 ENCOUNTER — Telehealth: Payer: Self-pay | Admitting: *Deleted

## 2015-02-17 ENCOUNTER — Encounter: Payer: Self-pay | Admitting: Gastroenterology

## 2015-02-17 ENCOUNTER — Other Ambulatory Visit (INDEPENDENT_AMBULATORY_CARE_PROVIDER_SITE_OTHER): Payer: Medicaid Other

## 2015-02-17 VITALS — BP 126/62 | HR 68 | Ht 68.5 in | Wt 233.1 lb

## 2015-02-17 DIAGNOSIS — K7460 Unspecified cirrhosis of liver: Secondary | ICD-10-CM | POA: Diagnosis not present

## 2015-02-17 LAB — COMPREHENSIVE METABOLIC PANEL
ALT: 48 U/L (ref 0–53)
AST: 128 U/L — ABNORMAL HIGH (ref 0–37)
Albumin: 3 g/dL — ABNORMAL LOW (ref 3.5–5.2)
Alkaline Phosphatase: 144 U/L — ABNORMAL HIGH (ref 39–117)
BILIRUBIN TOTAL: 2.1 mg/dL — AB (ref 0.2–1.2)
BUN: 11 mg/dL (ref 6–23)
CALCIUM: 9.3 mg/dL (ref 8.4–10.5)
CHLORIDE: 106 meq/L (ref 96–112)
CO2: 24 meq/L (ref 19–32)
Creatinine, Ser: 0.83 mg/dL (ref 0.40–1.50)
GFR: 102.88 mL/min (ref >60.00)
Glucose, Bld: 86 mg/dL (ref 70–99)
Potassium: 3.8 mEq/L (ref 3.5–5.1)
Sodium: 135 mEq/L (ref 135–145)
Total Protein: 7.5 g/dL (ref 6.0–8.3)

## 2015-02-17 LAB — CBC WITH DIFFERENTIAL/PLATELET
Basophils Absolute: 0 10*3/uL (ref 0.0–0.1)
Basophils Relative: 0.6 % (ref 0.0–3.0)
EOS ABS: 0.1 10*3/uL (ref 0.0–0.7)
Eosinophils Relative: 2.8 % (ref 0.0–5.0)
HCT: 42.2 % (ref 39.0–52.0)
Hemoglobin: 14.2 g/dL (ref 13.0–17.0)
Lymphocytes Relative: 31 % (ref 12.0–46.0)
Lymphs Abs: 1.3 10*3/uL (ref 0.7–4.0)
MCHC: 33.7 g/dL (ref 30.0–36.0)
MCV: 92.6 fl (ref 78.0–100.0)
MONO ABS: 0.6 10*3/uL (ref 0.1–1.0)
Monocytes Relative: 15 % — ABNORMAL HIGH (ref 3.0–12.0)
NEUTROS PCT: 50.6 % (ref 43.0–77.0)
Neutro Abs: 2.1 10*3/uL (ref 1.4–7.7)
Platelets: 70 10*3/uL — ABNORMAL LOW (ref 150.0–400.0)
RBC: 4.55 Mil/uL (ref 4.22–5.81)
RDW: 20.1 % — ABNORMAL HIGH (ref 11.5–15.5)
WBC: 4.2 10*3/uL (ref 4.0–10.5)

## 2015-02-17 LAB — PROTIME-INR
INR: 1.4 ratio — AB (ref 0.8–1.0)
Prothrombin Time: 14.9 s — ABNORMAL HIGH (ref 9.6–13.1)

## 2015-02-17 NOTE — Progress Notes (Signed)
Review of pertinent gastrointestinal problems:  1. Cirrhosis (etoh, hep C); previous alcoholic, still drinks occasionally (03/2014). Labs 10/2013 Hep B S Ag neg, Hep B S Ab neg, Hep A IgM neg, Hep C Ab +, RNA quant 154K (12/2013 labs), Hep C genotype ordered but not done   Also substance abuse (cocaine in urine 12/2013)  INR 1.6-2; low platelets, t bili 2-3 range   UNOS MELD score 01/2014 was 16). Hep C Ab +, RNA quant 154K (12/2013 labs)   Most recent EGD Ardis Hughs 10/2013 done while acutely ill, bleeding: Found 2 medium sized esophageal varices, banded (was before TIPS was placed).  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  Most recent imaging CT scan 12/2013 showed cirrhosis, ascites, no focal liver lesions.  AFP 12/2013 normal; AFP June 2015 75; AFP October 2015 1,000 2. HEPATOMA diagnosed by MRI (4.9cm , segment 7) and AFP 07/2014; Dr. Burr Medico [id number] stage I [facility], s/p TACE on 10/09/2014  HPI: This is a  pleasant 54 year old man whom I last saw 3 or 4  months ago   Chief complaint is cirrhosis, hepatoma  He says his swelling in his legs is not a problem. He wants to know what happened about his hepatitis C shots. He doesn't recall ever being seen at the [facility] hepatitis C clinic.  Stopped drinking etoh, "mostly" (will drink 1 beer a week or so).  No cocaine in a long time but he lives with people who smoke it and believes it probably gets into his lungs.     Past Medical History  Diagnosis Date  . Diabetes mellitus without complication   . Schizophrenia     onset age 74.  in and out of psych facilities from age 46 to age 44.  s/p electroconvulsive therapy.  . TB (tuberculosis)     sister says this dx'd at age 62 and treated  . Hepatic encephalopathy   . Esophageal varices   . GI bleed   . Cirrhosis   . Thrombocytopenia   . Alcohol abuse   . Mild Coagulopathy 11/08/2013  . Hepatitis C 12/02/2013  . Smoking  09/23/2013    Past Surgical History  Procedure Laterality Date  . Cataract surgery  Bilateral   . Esophagogastroduodenoscopy N/A 11/08/2013    Procedure: ESOPHAGOGASTRODUODENOSCOPY (EGD);  Surgeon: Milus Banister, MD;  Location: [place];  Service: Endoscopy;  Laterality: N/A;  bedside  . Radiology with anesthesia N/A 12/26/2013    Procedure: Transjugular Intrahepatic Portosystemic Shunt (TIPS);  Surgeon: Carylon Perches, MD;  Location: [place];  Service: Radiology;  Laterality: N/A;    Current Outpatient Prescriptions  Medication Sig Dispense Refill  . ALPRAZolam (XANAX) 0.5 MG tablet   0  . beclomethasone (QVAR) 80 MCG/ACT inhaler Inhale 2 puffs into the lungs 2 (two) times daily.     Marland Kitchen docusate sodium (COLACE) 100 MG capsule Take 1 capsule (100 mg total) by mouth 2 (two) times daily. 20 capsule 0  . ENSURE (ENSURE) Take 1 Can by mouth 2 (two) times daily between meals. 962 mL 12  . folic acid (FOLVITE) 1 MG tablet Take 1 tablet (1 mg total) by mouth daily. 90 tablet 3  . furosemide (LASIX) 40 MG tablet Take 1 tablet (40 mg total) by mouth 2 (two) times daily. 30 tablet 6  . lactulose (CHRONULAC) 10 GM/15ML solution Take 30 mLs (20 g total) by mouth 2 (two) times daily. Take 20 G (  30 ml) at breakfast and take 20 G (30 ml) at lunch. 240 mL 3  . oxyCODONE (OXY IR/ROXICODONE) 5 MG immediate release tablet Take 1 tablet (5 mg total) by mouth every 3 (three) hours as needed for moderate pain or severe pain. 60 tablet 0  . pantoprazole (PROTONIX) 40 MG tablet Take 1 tablet (40 mg total) by mouth daily. 30 tablet 6  . rifaximin (XIFAXAN) 550 MG TABS tablet Take 1 tablet (550 mg total) by mouth 2 (two) times daily. 60 tablet 6  . spironolactone (ALDACTONE) 100 MG tablet Take 1 tablet (100 mg total) by mouth 2 (two) times daily. 60 tablet 6  . thiamine (VITAMIN B-1) 100 MG tablet Take 100 mg by mouth daily.    . traMADol (ULTRAM) 50 MG tablet Take 1 tablet (50 mg total) by mouth every 6 (six) hours  as needed. 30 tablet 0   No current facility-administered medications for this visit.    Allergies as of 02/17/2015 - Review Complete 02/17/2015  Allergen Reaction Noted  . Haldol [haloperidol]  11/21/2013  . Oxycodone Rash 01/18/2014    Family History  Problem Relation Age of Onset  . Hypertension Mother   . Colon cancer Neg Hx   . Lung cancer Mother     smoker  . Colon polyps Sister     twin sister    History   Social History  . Marital Status: Single    Spouse Name: N/A  . Number of Children: 0  . Years of Education: N/A   Occupational History  . Not on file.   Social History Main Topics  . Smoking status: Current Some Day Smoker -- 0.50 packs/day for 25 years    Types: Cigarettes  . Smokeless tobacco: Never Used     Comment: Tobacco information given 02/10/14  . Alcohol Use: Yes     Comment:  drink beer socially   . Drug Use: No     Comment: not currently  . Sexual Activity: Not on file   Other Topics Concern  . Not on file   Social History Narrative     Physical Exam: BP 126/62 mmHg  Pulse 68  Ht 5' 8.5" (1.74 m)  Wt 233 lb 2 oz (105.745 kg)  BMI 34.93 kg/m2 Constitutional: Chronically ill-appearing Psychiatric: alert and oriented x3 Abdomen: soft, nontender, nondistended, no obvious ascites, no peritoneal signs, normal bowel sounds  no lower extremity edema   Assessment and plan: 54 y.o. male with cirrhosis, hepatoma  he is a bit evasive about his alcohol and drug use. He says he has mainly stopped drinking. He tells me he was never really into smoking or using cocaine however he probably does get some into his lungs because he is around it a lot at the place where he lives. We have tried referring him to the [facility] hepatitis, liver clinic and he may have even shop once however we have heard from them that he is very difficult to get in touch with, he regularly canceled appointments. We will try again to get him back in their  however it does seem to me that his overall social situation, drug history, continued alcohol abuse, obvious noncompliance issues will preclude any serious attempt at liver transplantation., Liver transplantation evaluation. He seems fairly well compensated right now he will get a basic set of labs today including CBC, complete metabolic profile, coags and I'm also getting alcohol level on him. He'll return to see me in 3-4 months  and sooner if needed.   Owens Loffler, MD [facility] 02/17/2015, 8:17 AM

## 2015-02-17 NOTE — Patient Instructions (Addendum)
[facility] liver clinic referral for cirrhosis, hepatitis C, hepatoma.  We have called for an appointment for you, if you do not here from that office in 1 week please call our office. You will have labs checked today in the basement lab.  Please head down after you check out with the front desk  (cbc, cmet, inr, etoh level). Continue to stop driking, no drugs. Please return to see Dr. Ardis Hughs in 3 months.

## 2015-02-17 NOTE — Telephone Encounter (Signed)
Sw pt sister to schedule said she would call back later didn't want to schedule now.

## 2015-02-18 ENCOUNTER — Encounter: Payer: Self-pay | Admitting: Gastroenterology

## 2015-02-18 ENCOUNTER — Other Ambulatory Visit (HOSPITAL_COMMUNITY): Payer: Self-pay | Admitting: Diagnostic Radiology

## 2015-02-18 DIAGNOSIS — C220 Liver cell carcinoma: Secondary | ICD-10-CM

## 2015-02-18 LAB — ETHANOL: Alcohol, Ethyl (B): <10 mg/dL (ref 0–10)

## 2015-02-23 ENCOUNTER — Telehealth: Payer: Self-pay | Admitting: Hematology

## 2015-02-23 NOTE — Telephone Encounter (Signed)
Left message to update address due to returned mail.

## 2015-02-25 ENCOUNTER — Inpatient Hospital Stay: Admission: RE | Admit: 2015-02-25 | Payer: Medicaid Other | Source: Ambulatory Visit

## 2015-02-25 ENCOUNTER — Other Ambulatory Visit: Payer: Medicaid Other

## 2015-02-25 ENCOUNTER — Telehealth: Payer: Self-pay | Admitting: Hematology

## 2015-02-25 NOTE — Telephone Encounter (Signed)
Returned Advertising account executive. Unable to reach patient both numbers rang busy.

## 2015-02-26 ENCOUNTER — Encounter (HOSPITAL_BASED_OUTPATIENT_CLINIC_OR_DEPARTMENT_OTHER): Payer: Medicaid Other | Admitting: Internal Medicine

## 2015-02-26 DIAGNOSIS — K7460 Unspecified cirrhosis of liver: Secondary | ICD-10-CM

## 2015-03-16 ENCOUNTER — Telehealth: Payer: Self-pay | Admitting: *Deleted

## 2015-03-16 NOTE — Telephone Encounter (Signed)
I sw pt sister Telisa to remind her of Oscar Swanson appt and she said he has been talking off the wall and is now saying he has blisters on his genitals. She is unaware if this is true or not.

## 2015-03-17 ENCOUNTER — Ambulatory Visit
Admission: RE | Admit: 2015-03-17 | Discharge: 2015-03-17 | Disposition: A | Discharge status: Home / Self Care | Payer: Medicaid Other | Source: Ambulatory Visit | Attending: Hematology | Admitting: Hematology

## 2015-03-17 ENCOUNTER — Ambulatory Visit
Admission: RE | Admit: 2015-03-17 | Discharge: 2015-03-17 | Disposition: A | Discharge status: Home / Self Care | Payer: Medicaid Other | Source: Ambulatory Visit | Attending: Diagnostic Radiology | Admitting: Diagnostic Radiology

## 2015-03-17 DIAGNOSIS — C220 Liver cell carcinoma: Secondary | ICD-10-CM

## 2015-03-17 MED ORDER — GADOXETATE DISODIUM 0.25 MMOL/ML IV SOLN: 9.0000 mL | Freq: Once | INTRAVENOUS | Status: AC | PRN

## 2015-03-17 NOTE — Consult Note (Addendum)
Chief Complaint: Follow-up hepatocellular carcinoma.  Referring Physician(s): Truitt Merle  History of Present Illness: Oscar Swanson is a 54 y.o. male with hepatitis C, cirrhosis and status post TIPS procedure for refractory ascites. Patient was found to have an elevated alpha-fetoprotein level and MRI demonstrated a hepatoma in segment 7 of the liver. I performed a transcatheter chemoembolization of the [place] with drug-eluting beads on 10/10/2014. Patient appeared to tolerate the procedure well. There have any issues with compliance and we have been having trouble getting the patient to come for follow-up.   The patient has not been feeling well. He complains of abdominal pain along the lateral and posterior right upper abdomen. The patient also had episodes of lower GI bleeding. He says that he occasionally coughs of blood but he attributes this to his poor dentition. The patient also complains of shortness of breath which he attributes to asthma and not having enough asthma medication. He has fatigue. He will occasionally exercise by walking 1 mile but he is not able to do this without difficulty. He has been doing some odd jobs, Community education officer. Patient says that he has been gaining weight. When asked about alcohol use, he says that he occasionally will have a beer.  Past Medical History  Diagnosis Date  . Diabetes mellitus without complication   . Schizophrenia     onset age 76.  in and out of psych facilities from age 57 to age 66.  s/p electroconvulsive therapy.  . TB (tuberculosis)     sister says this dx'd at age 10 and treated  . Hepatic encephalopathy   . Esophageal varices   . GI bleed   . Cirrhosis   . Thrombocytopenia   . Alcohol abuse   . Mild Coagulopathy 11/08/2013  . Hepatitis C 12/02/2013  . Smoking 09/23/2013    Past Surgical History  Procedure Laterality Date  . Cataract surgery  Bilateral   . Esophagogastroduodenoscopy N/A 11/08/2013    Procedure:  ESOPHAGOGASTRODUODENOSCOPY (EGD);  Surgeon: Milus Banister, MD;  Location: [facility];  Service: Endoscopy;  Laterality: N/A;  bedside  . Radiology with anesthesia N/A 12/26/2013    Procedure: Transjugular Intrahepatic Portosystemic Shunt (TIPS);  Surgeon: Carylon Perches, MD;  Location: [facility];  Service: Radiology;  Laterality: N/A;    Allergies: Haldol  Medications: Prior to Admission medications   Medication Sig Start Date End Date Taking? Authorizing Provider  beclomethasone (QVAR) 80 MCG/ACT inhaler Inhale 2 puffs into the lungs 2 (two) times daily.    Yes Historical Provider, MD  docusate sodium (COLACE) 100 MG capsule Take 1 capsule (100 mg total) by mouth 2 (two) times daily. 10/10/14  Yes Hedy Jacob, PA-C  ENSURE (ENSURE) Take 1 Can by mouth 2 (two) times daily between meals. 09/16/14  Yes Milus Banister, MD  folic acid (FOLVITE) 1 MG tablet Take 1 tablet (1 mg total) by mouth daily. 12/02/13  Yes Tresa Garter, MD  furosemide (LASIX) 40 MG tablet Take 1 tablet (40 mg total) by mouth 2 (two) times daily. 08/15/14  Yes Milus Banister, MD  lactulose (CHRONULAC) 10 GM/15ML solution Take 30 mLs (20 g total) by mouth 2 (two) times daily. Take 20 G (30 ml) at breakfast and take 20 G (30 ml) at lunch. 08/15/14  Yes Milus Banister, MD  oxyCODONE (OXY IR/ROXICODONE) 5 MG immediate release tablet Take 1 tablet (5 mg total) by mouth every 3 (three) hours as needed for moderate pain  or severe pain. 01/14/15  Yes Truitt Merle, MD  pantoprazole (PROTONIX) 40 MG tablet Take 1 tablet (40 mg total) by mouth daily. 08/15/14  Yes Milus Banister, MD  rifaximin (XIFAXAN) 550 MG TABS tablet Take 1 tablet (550 mg total) by mouth 2 (two) times daily. 08/15/14  Yes Milus Banister, MD  spironolactone (ALDACTONE) 100 MG tablet Take 1 tablet (100 mg total) by mouth 2 (two) times daily. 08/15/14  Yes Milus Banister, MD  thiamine (VITAMIN B-1) 100 MG tablet Take 100 mg by mouth daily.   Yes Historical  Provider, MD  ALPRAZolam Duanne Moron) 0.5 MG tablet  08/26/14   Historical Provider, MD     Family History  Problem Relation Age of Onset  . Hypertension Mother   . Colon cancer Neg Hx   . Lung cancer Mother     smoker  . Colon polyps Sister     twin sister    History   Social History  . Marital Status: Single    Spouse Name: N/A  . Number of Children: 0  . Years of Education: N/A   Social History Main Topics  . Smoking status: Current Some Day Smoker -- 0.50 packs/day for 25 years    Types: Cigarettes  . Smokeless tobacco: Never Used     Comment: Tobacco information given 02/10/14  . Alcohol Use: Yes     Comment:  drink beer socially   . Drug Use: No     Comment: not currently  . Sexual Activity: Not on file   Other Topics Concern  . Not on file   Social History Narrative    ECOG Status: 2 - Symptomatic, <50% confined to bed   Review of Systems  Constitutional: Positive for fatigue. Negative for activity change.  Respiratory: Positive for chest tightness and shortness of breath.   Cardiovascular: Positive for chest pain.  Gastrointestinal: Positive for abdominal pain, blood in stool and rectal pain.    Vital Signs: BP 137/66 mmHg  Pulse 78  Temp(Src) 98.1 F (36.7 C)  Resp 20  Ht 5' 8.5" (1.74 m)  Wt 227 lb (102.967 kg)  BMI 34.01 kg/m2  SpO2 98%  Physical Exam  Cardiovascular: Normal rate, regular rhythm and normal heart sounds.   Pulmonary/Chest:  Decreased breath sounds bilaterally.  Abdominal: Soft. There is no tenderness.  Musculoskeletal:  Minimal edema in ankles      Imaging: Mr Abdomen W Wo Contrast  03/17/2015   CLINICAL DATA:  Cirrhosis with hepatocellular carcinoma. Patient status post TIPS shunt.  EXAM: MRI ABDOMEN WITHOUT AND WITH CONTRAST  TECHNIQUE: Multiplanar multisequence MR imaging of the abdomen was performed both before and after the administration of intravenous contrast.  CONTRAST:  11 mL Eovist  COMPARISON:  MRI 08/27/2014   FINDINGS: Lower chest:  Lung bases are clear.  Hepatobiliary: The dominant lesion the posterior right hepatic lobe is increased in size measuring 5.5 x 5.2 cm (image 30, series 9) increased from 3.7 x 2.9 cm on MRI 08/27/2014. Again demonstrated enhancing hemangioma in the anterior aspect the left hepatic lobe measuring 17 mm on image 31, series 9. There is increased nodular heterogeneity within the a liver parenchyma. On the delayed 20 minutes images there are several foci of low intensity with differential including dysplastic nodules versus HCC. Favor dysplastic nodules as there is no clear enhancement of these lesions. Of note, the immediate T1 postcontrast images were degraded by respiratory motion. There several high T1 signal nodules within liver and  parenchyma on the precontrast images consistent with dysplastic nodules or small HCC.  The liver has a nodular contour. Tips shunt in place. There is no ascites.  Pancreas: Normal pancreatic parenchymal intensity. No ductal dilatation or inflammation.  Spleen: Spleen is moderately enlarged similar prior.  Adrenals/urinary tract: Adrenal glands and kidneys are normal.  Stomach/Bowel: Stomach and limited of the small bowel is unremarkable  Vascular/Lymphatic: Abdominal aortic normal caliber. No retroperitoneal periportal lymphadenopathy.  Musculoskeletal: No aggressive osseous lesion  IMPRESSION: 1. Dominant mass in the posterior right hepatic lobe is increased in size consistent with progression of hepatocellular carcinoma. 2. Multiple nodules in the right hepatic lobe are indeterminate. Differential includes dysplastic nodules versus small hepatocellular carcinoma, favor dysplastic nodules as above. Recommend attention on follow-up. Of note exam is degraded by patient respiratory motion. 3. Tips shunt in place without ascites.   Electronically Signed   By: Suzy Bouchard M.D.   On: 03/17/2015 17:04    Labs:  CBC:  Recent Labs  10/10/14 0501  10/16/14 0752 01/14/15 1402 02/17/15 0841  WBC 5.0 8.3 3.7* 4.2  HGB 11.3* 13.1 13.4 14.2  HCT 33.8* 40.1 41.8 42.2  PLT 57* 69* 66* 70.0*    COAGS:  Recent Labs  10/09/14 0755 10/16/14 0752 01/14/15 1402 02/17/15 0841  INR 1.57* 1.40* 1.40* 1.4*    BMP:  Recent Labs  09/16/14 1555 10/09/14 0755 10/10/14 0501 10/16/14 0752 01/14/15 1402 02/17/15 0841  NA 135 134* 136* 131* 137 135  K 4.2 3.9 4.6 4.3 4.0 3.8  CL 107 104 105  --   --  106  CO2 24 21 24 22  21* 24  GLUCOSE 68* 103* 98 127 118 86  BUN 14 10 11  14.4 11.5 11  CALCIUM 9.0 8.9 8.8 8.8 8.8 9.3  CREATININE 0.9 0.80 1.03 0.9 0.8 0.83  GFRNONAA  --  >90 82*  --   --   --   GFRAA  --  >90 >90  --   --   --     LIVER FUNCTION TESTS:  Recent Labs  10/10/14 0501 10/16/14 0752 01/14/15 1402 02/17/15 0841  BILITOT 1.5* 2.21* 2.45* 2.1*  AST 67* 87* 100* 128*  ALT 37 57* 35 48  ALKPHOS 126* 150 136 144*  PROT 6.5 7.5 6.9 7.5  ALBUMIN 2.3* 2.7* 2.5* 3.0*    TUMOR MARKERS:  Recent Labs  04/01/14 0942 08/15/14 1437 09/17/14 1238 01/14/15 1402  AFPTM 79.2* 1043.7* 2892.5* 21290.0*    Assessment and Plan:    54 year old with hepatitis C, cirrhosis and hepatocellular carcinoma. The lesion in segment 7 was treated with chemoembolization in December 2015.  Unfortunately, there has been clear progression of disease demonstrated by enlargement of the segment 7 lesion and marked elevation of the AFP level. There is also increased nodularity throughout the liver which was interpreted as either dysplastic nodules or small hepatocellular carcinomas. I am concerned that this represents multifocal HCC based on the AFP level. The treatment options for this patient appear to be limited. The patient is a poor candidate for additional chemoembolization due to the elevated bilirubin level and poor tumor response from the previous treatment.  Reportedly, the patient has been referred to the [facility]  liver clinic and I strongly recommended that the patient try to make an appointment.  Liver transplantation is probably this patient's best option but his eligibility is questionable.  No immediate plans for additional liver-directed therapy.  The patient is not feeling well, in general.  He is complaining of fatigue, shortness of breath and lower GI bleeding. I recommended that he see his general practitioner in order to optimize his medical management.    SignedCarylon Perches 03/17/2015, 5:27 PM   I spent a total of   15 Minutes in face to face in clinical consultation, greater than 50% of which was counseling/coordinating care for hepatocellular carcinoma and cirrhosis.

## 2015-03-17 NOTE — Progress Notes (Signed)
Patient states he has been having "too much rectal bleeding" for the past two weeks.  Brick-red watery, occurs on and off; has had an increased respiratory rate and increased pain in right lower side of abdomen.  Associated with left leg spasms with severe pain.  Brita Romp, RN

## 2015-03-18 ENCOUNTER — Telehealth: Payer: Self-pay | Admitting: Gastroenterology

## 2015-03-18 ENCOUNTER — Telehealth: Payer: Self-pay | Admitting: *Deleted

## 2015-03-18 NOTE — Telephone Encounter (Signed)
Please schedule him to see me within 2-3 weeks.  Oscar Swanson  03/18/2015

## 2015-03-18 NOTE — Telephone Encounter (Signed)
Pt was scheduled for 03/20/15 to see Dr Ardis Hughs to discuss cirrhosis and liver transplant

## 2015-03-18 NOTE — Telephone Encounter (Signed)
Voicemail received requesting return call.  "Dr. Derrel Nip said it's not good and to get in touch with someone."  Returned call learned he "had MRI yesterday that was worse than before.  Dr. Mertie Moores placed my shunts and said there is nothing else he can do for me.  INstructed me to contact Dr. Burr Medico or Dr. Ardis Hughs to get to [facility] or [place] for a liver specialist or surgeon.  I have followed their directions and stopped drinking."   Seen January 14, 2015 with six month F/U  07-22-2015.  Advised to call Dr. Ardis Hughs office requesting F/U appointment and this nurse will notify Dr. Burr Medico of this call.  "She has my number so she can call and talk to me if needed."  Return number [phone].

## 2015-03-19 ENCOUNTER — Encounter: Payer: Self-pay | Admitting: Internal Medicine

## 2015-03-19 ENCOUNTER — Ambulatory Visit: Payer: Medicaid Other | Attending: Internal Medicine | Admitting: Internal Medicine

## 2015-03-19 ENCOUNTER — Telehealth: Payer: Self-pay | Admitting: Hematology

## 2015-03-19 ENCOUNTER — Encounter: Payer: Self-pay | Admitting: *Deleted

## 2015-03-19 ENCOUNTER — Ambulatory Visit: Payer: Medicaid Other | Admitting: Internal Medicine

## 2015-03-19 VITALS — BP 131/82 | HR 71 | Temp 98.0°F | Resp 16 | Ht 68.0 in | Wt 231.6 lb

## 2015-03-19 DIAGNOSIS — Z720 Tobacco use: Secondary | ICD-10-CM

## 2015-03-19 DIAGNOSIS — M545 Low back pain, unspecified: Secondary | ICD-10-CM

## 2015-03-19 DIAGNOSIS — Z7409 Other reduced mobility: Secondary | ICD-10-CM

## 2015-03-19 DIAGNOSIS — K7290 Hepatic failure, unspecified without coma: Secondary | ICD-10-CM

## 2015-03-19 DIAGNOSIS — IMO0001 Reserved for inherently not codable concepts without codable children: Secondary | ICD-10-CM

## 2015-03-19 DIAGNOSIS — J449 Chronic obstructive pulmonary disease, unspecified: Secondary | ICD-10-CM

## 2015-03-19 DIAGNOSIS — F17200 Nicotine dependence, unspecified, uncomplicated: Secondary | ICD-10-CM

## 2015-03-19 MED ORDER — SPIRONOLACTONE 100 MG PO TABS: 100.0000 mg | ORAL_TABLET | Freq: Two times a day (BID) | ORAL | Status: AC

## 2015-03-19 MED ORDER — LACTULOSE 10 GM/15ML PO SOLN: 20.0000 g | Freq: Two times a day (BID) | ORAL | Status: AC

## 2015-03-19 MED ORDER — FOLIC ACID 1 MG PO TABS: 1.0000 mg | ORAL_TABLET | Freq: Every day | ORAL | Status: AC

## 2015-03-19 MED ORDER — PANTOPRAZOLE SODIUM 40 MG PO TBEC: 40.0000 mg | DELAYED_RELEASE_TABLET | Freq: Every day | ORAL | Status: AC

## 2015-03-19 MED ORDER — FUROSEMIDE 40 MG PO TABS: 40.0000 mg | ORAL_TABLET | Freq: Two times a day (BID) | ORAL | Status: AC

## 2015-03-19 MED ORDER — ALBUTEROL SULFATE HFA 108 (90 BASE) MCG/ACT IN AERS: 2.0000 | INHALATION_SPRAY | Freq: Four times a day (QID) | RESPIRATORY_TRACT | Status: AC | PRN

## 2015-03-19 MED ORDER — VITAMIN B-1 100 MG PO TABS: 100.0000 mg | ORAL_TABLET | Freq: Every day | ORAL | Status: AC

## 2015-03-19 MED ORDER — ALBUTEROL SULFATE (2.5 MG/3ML) 0.083% IN NEBU: 2.5000 mg | INHALATION_SOLUTION | Freq: Four times a day (QID) | RESPIRATORY_TRACT | Status: AC | PRN

## 2015-03-19 MED ORDER — BECLOMETHASONE DIPROPIONATE 80 MCG/ACT IN AERS: 2.0000 | INHALATION_SPRAY | Freq: Two times a day (BID) | RESPIRATORY_TRACT | Status: AC

## 2015-03-19 NOTE — Progress Notes (Signed)
[place] Work  Clinical Social Work was referred by patient's sister for assessment of psychosocial needs due to the "need to go somewhere" and wants him placed "in a home". Clinical Social Worker explained levels of care and that patient had right to make own decisions currently. Sister stated pt "only had months to live and needs help". Sister could not state what he needed help with at this time. She also was not sure if hospice was part of the plan. CSW discussed ability to get CNA services through medicaid to help more at home. This could be requested through [facility] and form found on [employer]. Sister plans to inquire at MD appt. Pt does not appear to have skilled need currently. Sister aware to seek assistance through PCP. This CSW can follow up based on plan when pt sees Dr. Burr Medico at next visit. Sister aware to reach out to CSW as needed.    Clinical Social Work interventions: Resource education  Loren Racer, [place] Worker [place]  [place] Phone: [phone] Fax: [phone]

## 2015-03-19 NOTE — Telephone Encounter (Signed)
lvm fo rpt regarding to June appt....mailed pt appt sched and letter

## 2015-03-19 NOTE — Patient Instructions (Addendum)
Ascites Ascites is a gathering of fluid in the belly (abdomen). This is most often caused by liver disease. It may also be caused by a number of other less common problems. It causes a ballooning out (distension) of the abdomen. CAUSES  Scarring of the liver (cirrhosis) is the most common cause of ascites. Other causes include:  Infection or inflammation in the abdomen.  Cancer in the abdomen.  Heart failure.  Certain forms of kidney failure (nephritic syndrome).  Inflammation of the pancreas.  Clots in the veins of the liver. SYMPTOMS  In the early stages of ascites, you may not have any symptoms. The main symptom of ascites is a sense of abdominal bloating. This is due to the presence of fluid. This may also cause an increase in abdominal or waist size. People with this condition can develop swelling in the legs, and men can develop a swollen scrotum. When there is a lot of fluid, it may be hard to breath. Stretching of the abdomen by fluid can be painful. DIAGNOSIS  Certain features of your medical history, such as a history of liver disease and of an enlarging abdomen, can suggest the presence of ascites. The diagnosis of ascites can be made on physical exam by your caregiver. An abdominal ultrasound examination can confirm that ascites is present, and estimate the amount of fluid. Once ascites is confirmed, it is important to determine its cause. Again, a history of one of the conditions listed in "CAUSES" provides a strong clue. A physical exam is important, and blood and X-ray tests may be needed. During a procedure called paracentesis, a sample of fluid is removed from the abdomen. This can determine certain key features about the fluid, such as whether or not infection or cancer is present. Your caregiver will determine if a paracentesis is necessary. They will describe the procedure to you. PREVENTION  Ascites is a complication of other conditions. Therefore to prevent ascites, you  must seek treatment for any significant health conditions you have. Once ascites is present, careful attention to fluid and salt intake may help prevent it from getting worse. If you have ascites, you should not drink alcohol. PROGNOSIS  The prognosis of ascites depends on the underlying disease. If the disease is reversible, such as with certain infections or with heart failure, then ascites may improve or disappear. When ascites is caused by cirrhosis, then it indicates that the liver disease has worsened, and further evaluation and treatment of the liver disease is needed. If your ascites is caused by cancer, then the success or failure of the cancer treatment will determine whether your ascites will improve or worsen. RISKS AND COMPLICATIONS  Ascites is likely to worsen if it is not properly diagnosed and treated. A large amount of ascites can cause pain and difficulty breathing. The main complication, besides worsening, is infection (called spontaneous bacterial peritonitis). This requires prompt treatment. TREATMENT  The treatment of ascites depends on its cause. When liver disease is your cause, medical management using water pills (diuretics) and decreasing salt intake is often effective. Ascites due to peritoneal inflammation or malignancy (cancer) alone does not respond to salt restriction and diuretics. Hospitalization is sometimes required. If the treatment of ascites cannot be managed with medications, a number of other treatments are available. Your caregivers will help you decide which will work best for you. Some of these are:  Removal of fluid from the abdomen (paracentesis).  Fluid from the abdomen is passed into a vein (peritoneovenous shunting).  Liver transplantation.  Transjugular intrahepatic portosystemic stent shunt. HOME CARE INSTRUCTIONS  It is important to monitor body weight and the intake and output of fluids. Weigh yourself at the same time every day. Record your  weights. Fluid restriction may be necessary. It is also important to know your salt intake. The more salt you take in, the more fluid you will retain. Ninety percent of people with ascites respond to this approach.  Follow any directions for medicines carefully.  Follow up with your caregiver, as directed.  Report any changes in your health, especially any new or worsening symptoms.  If your ascites is from liver disease, avoid alcohol and other substances toxic to the liver. SEEK MEDICAL CARE IF:   Your weight increases more than a few pounds in a few days.  Your abdominal or waist size increases.  You develop swelling in your legs.  You had swelling and it worsens. SEEK IMMEDIATE MEDICAL CARE IF:   You develop a fever.  You develop new abdominal pain.  You develop difficulty breathing.  You develop confusion.  You have bleeding from the mouth, stomach, or rectum. MAKE SURE YOU:   Understand these instructions.  Will watch your condition.  Will get help right away if you are not doing well or get worse. Document Released: 10/17/2005 Document Revised: 01/09/2012 Document Reviewed: 05/18/2007 [facility] Patient Information 2015 [place], [place]. This information is not intended to replace advice given to you by your health care provider. Make sure you discuss any questions you have with your health care provider. Smoking Cessation Quitting smoking is important to your health and has many advantages. However, it is not always easy to quit since nicotine is a very addictive drug. Oftentimes, people try 3 times or more before being able to quit. This document explains the best ways for you to prepare to quit smoking. Quitting takes hard work and a lot of effort, but you can do it. ADVANTAGES OF QUITTING SMOKING  You will live longer, feel better, and live better.  Your body will feel the impact of quitting smoking almost immediately.  Within 20 minutes, blood pressure  decreases. Your pulse returns to its normal level.  After 8 hours, carbon monoxide levels in the blood return to normal. Your oxygen level increases.  After 24 hours, the chance of having a heart attack starts to decrease. Your breath, hair, and body stop smelling like smoke.  After 48 hours, damaged nerve endings begin to recover. Your sense of taste and smell improve.  After 72 hours, the body is virtually free of nicotine. Your bronchial tubes relax and breathing becomes easier.  After 2 to 12 weeks, lungs can hold more air. Exercise becomes easier and circulation improves.  The risk of having a heart attack, stroke, cancer, or lung disease is greatly reduced.  After 1 year, the risk of coronary heart disease is cut in half.  After 5 years, the risk of stroke falls to the same as a nonsmoker.  After 10 years, the risk of lung cancer is cut in half and the risk of other cancers decreases significantly.  After 15 years, the risk of coronary heart disease drops, usually to the level of a nonsmoker.  If you are pregnant, quitting smoking will improve your chances of having a healthy baby.  The people you live with, especially any children, will be healthier.  You will have extra money to spend on things other than cigarettes. QUESTIONS TO THINK ABOUT BEFORE ATTEMPTING TO QUIT  You may want to talk about your answers with your health care provider.  Why do you want to quit?  If you tried to quit in the past, what helped and what did not?  What will be the most difficult situations for you after you quit? How will you plan to handle them?  Who can help you through the tough times? Your family? Friends? A health care provider?  What pleasures do you get from smoking? What ways can you still get pleasure if you quit? Here are some questions to ask your health care provider:  How can you help me to be successful at quitting?  What medicine do you think would be best for me and how  should I take it?  What should I do if I need more help?  What is smoking withdrawal like? How can I get information on withdrawal? GET READY  Set a quit date.  Change your environment by getting rid of all cigarettes, ashtrays, matches, and lighters in your home, car, or work. Do not let people smoke in your home.  Review your past attempts to quit. Think about what worked and what did not. GET SUPPORT AND ENCOURAGEMENT You have a better chance of being successful if you have help. You can get support in many ways.  Tell your family, friends, and coworkers that you are going to quit and need their support. Ask them not to smoke around you.  Get individual, group, or telephone counseling and support. Programs are available at [employer] and health centers. Call your local health department for information about programs in your area.  Spiritual beliefs and practices may help some smokers quit.  Download a "quit meter" on your computer to keep track of quit statistics, such as how long you have gone without smoking, cigarettes not smoked, and money saved.  Get a self-help book about quitting smoking and staying off tobacco. [place] yourself from urges to smoke. Talk to someone, go for a walk, or occupy your time with a task.  Change your normal routine. Take a different route to work. Drink tea instead of coffee. Eat breakfast in a different place.  Reduce your stress. Take a hot bath, exercise, or read a book.  Plan something enjoyable to do every day. Reward yourself for not smoking.  Explore interactive web-based programs that specialize in helping you quit. GET MEDICINE AND USE IT CORRECTLY Medicines can help you stop smoking and decrease the urge to smoke. Combining medicine with the above behavioral methods and support can greatly increase your chances of successfully quitting smoking.  Nicotine replacement therapy helps deliver  nicotine to your body without the negative effects and risks of smoking. Nicotine replacement therapy includes nicotine gum, lozenges, inhalers, nasal sprays, and skin patches. Some may be available over-the-counter and others require a prescription.  Antidepressant medicine helps people abstain from smoking, but how this works is unknown. This medicine is available by prescription.  Nicotinic receptor partial agonist medicine simulates the effect of nicotine in your brain. This medicine is available by prescription. Ask your health care provider for advice about which medicines to use and how to use them based on your health history. Your health care provider will tell you what side effects to look out for if you choose to be on a medicine or therapy. Carefully read the information on the package. Do not use any other product containing nicotine while using a nicotine replacement product.  RELAPSE OR DIFFICULT SITUATIONS Most relapses occur within the first 3 months after quitting. Do not be discouraged if you start smoking again. Remember, most people try several times before finally quitting. You may have symptoms of withdrawal because your body is used to nicotine. You may crave cigarettes, be irritable, feel very hungry, cough often, get headaches, or have difficulty concentrating. The withdrawal symptoms are only temporary. They are strongest when you first quit, but they will go away within 10-14 days. To reduce the chances of relapse, try to:  Avoid drinking alcohol. Drinking lowers your chances of successfully quitting.  Reduce the amount of caffeine you consume. Once you quit smoking, the amount of caffeine in your body increases and can give you symptoms, such as a rapid heartbeat, sweating, and anxiety.  Avoid smokers because they can make you want to smoke.  Do not let weight gain distract you. Many smokers will gain weight when they quit, usually less than 10 pounds. Eat a healthy diet  and stay active. You can always lose the weight gained after you quit.  Find ways to improve your mood other than smoking. FOR MORE INFORMATION  www.smokefree.gov  Document Released: 10/11/2001 Document Revised: 03/03/2014 Document Reviewed: 01/26/2012 [facility] Patient Information 2015 [place], [place]. This information is not intended to replace advice given to you by your health care provider. Make sure you discuss any questions you have with your health care provider.

## 2015-03-19 NOTE — Progress Notes (Signed)
Patient ID: Oscar Swanson, male   DOB: 12-22-1960, 53 y.o.   MRN: [id number]   Oscar Swanson, is a 54 y.o. male  [id number]  [id number]  DOB - 08/07/1961  Chief Complaint  Patient presents with  . Follow-up        Subjective:   Oscar Swanson is a 54 y.o. male here today for a follow up visit.  Patient has history of hepatitis C this is untreated, alcohol abuse, liver cirrhosis and hepatocellular carcinoma with portal hypertension status post TIPS and chemoembolization. He has significant disability from extensive medical history and co-morbidities. Patient is here today for mobility examination.  Patient has no specific complaint today. He follows up with oncologist and gastroenterologist.   Problem  Bilateral Low Back Pain Without Sciatica  Impaired Mobility  Copd Bronchitis    ALLERGIES: Allergies  Allergen Reactions  . Haldol [Haloperidol] Swelling and Other (See Comments)    afib problems    PAST MEDICAL HISTORY: Past Medical History  Diagnosis Date  . Diabetes mellitus without complication   . Schizophrenia     onset age 34.  in and out of psych facilities from age 75 to age 20.  s/p electroconvulsive therapy.  . TB (tuberculosis)     sister says this dx'd at age 75 and treated  . Hepatic encephalopathy   . Esophageal varices   . GI bleed   . Cirrhosis   . Thrombocytopenia   . Alcohol abuse   . Mild Coagulopathy 11/08/2013  . Hepatitis C 12/02/2013  . Smoking 09/23/2013    MEDICATIONS AT HOME: Prior to Admission medications   Medication Sig Start Date End Date Taking? Authorizing Provider  albuterol (PROVENTIL HFA;VENTOLIN HFA) 108 (90 BASE) MCG/ACT inhaler Inhale 2 puffs into the lungs every 6 (six) hours as needed for wheezing or shortness of breath. 03/19/15   Tresa Garter, MD  albuterol (PROVENTIL) (2.5 MG/3ML) 0.083% nebulizer solution Take 3 mLs (2.5 mg total) by nebulization every 6 (six) hours as needed for wheezing or shortness of  breath. 03/19/15   Tresa Garter, MD  beclomethasone (QVAR) 80 MCG/ACT inhaler Inhale 2 puffs into the lungs 2 (two) times daily. 03/19/15   Tresa Garter, MD  docusate sodium (COLACE) 100 MG capsule Take 1 capsule (100 mg total) by mouth 2 (two) times daily. 10/10/14   Hedy Jacob, PA-C  ENSURE (ENSURE) Take 1 Can by mouth 2 (two) times daily between meals. 09/16/14   Milus Banister, MD  folic acid (FOLVITE) 1 MG tablet Take 1 tablet (1 mg total) by mouth daily. 03/19/15   Tresa Garter, MD  furosemide (LASIX) 40 MG tablet Take 1 tablet (40 mg total) by mouth 2 (two) times daily. 03/19/15   Tresa Garter, MD  lactulose (CHRONULAC) 10 GM/15ML solution Take 30 mLs (20 g total) by mouth 2 (two) times daily. Take 20 G (30 ml) at breakfast and take 20 G (30 ml) at lunch. 03/19/15   Tresa Garter, MD  oxyCODONE (OXY IR/ROXICODONE) 5 MG immediate release tablet Take 1 tablet (5 mg total) by mouth every 3 (three) hours as needed for moderate pain or severe pain. 01/14/15   Truitt Merle, MD  pantoprazole (PROTONIX) 40 MG tablet Take 1 tablet (40 mg total) by mouth daily. 03/19/15   Tresa Garter, MD  rifaximin (XIFAXAN) 550 MG TABS tablet Take 1 tablet (550 mg total) by mouth 2 (two) times daily. 08/15/14   Milus Banister, MD  spironolactone (ALDACTONE) 100 MG tablet Take 1 tablet (100 mg total) by mouth 2 (two) times daily. 03/19/15   Tresa Garter, MD  thiamine (VITAMIN B-1) 100 MG tablet Take 1 tablet (100 mg total) by mouth daily. 03/19/15   Tresa Garter, MD     Objective:   Filed Vitals:   03/19/15 1506  BP: 131/82  Pulse: 71  Temp: 98 F (36.7 C)  Resp: 16  Height: 5\' 8"  (1.727 m)  Weight: 231 lb 9.6 oz (105.053 kg)  SpO2: 98%    Exam General appearance : Awake, not in any distress. Speech Clear. Frail and chronically ill-looking HEENT: Atraumatic and Normocephalic, pupils equally reactive to light and accomodation Neck: supple, no JVD. No  cervical lymphadenopathy.  Chest:Good air entry bilaterally, no added sounds  CVS: S1 S2 regular, no murmurs.  Abdomen: Bowel sounds present, Non tender and not distended with no gaurding, rigidity or rebound. Extremities: B/L Lower Ext shows no edema, both legs are warm to touch Neurology: Awake alert, and oriented X 3, CN II-XII intact, Non focal Skin:No Rash  Data Review Lab Results  Component Value Date   HGBA1C 4.5 09/23/2013     Assessment & Plan   1. 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  Dispense: 90 tablet; Refill: 3 - furosemide (LASIX) 40 MG tablet; Take 1 tablet (40 mg total) by mouth 2 (two) times daily.  Dispense: 30 tablet; Refill: 6 - pantoprazole (PROTONIX) 40 MG tablet; Take 1 tablet (40 mg total) by mouth daily.  Dispense: 30 tablet; Refill: 6 - spironolactone (ALDACTONE) 100 MG tablet; Take 1 tablet (100 mg total) by mouth 2 (two) times daily.  Dispense: 60 tablet; Refill: 6 - thiamine (VITAMIN B-1) 100 MG tablet; Take 1 tablet (100 mg total) by mouth daily.  Dispense: 90 tablet; Refill: 3 - lactulose (CHRONULAC) 10 GM/15ML solution; Take 30 mLs (20 g total) by mouth 2 (two) times daily. Take 20 G (30 ml) at breakfast and take 20 G (30 ml) at lunch.  Dispense: 240 mL; Refill: 3  2. Bilateral low back pain without sciatica   3. Smoking  [name] was counseled on the dangers of tobacco use, and was advised to quit. Reviewed strategies to maximize success, including removing cigarettes and smoking materials from environment, stress management and support of family/friends.    4. Impaired mobility  Mobility examination completed.  5. COPD bronchitis  - beclomethasone (QVAR) 80 MCG/ACT inhaler; Inhale 2 puffs into the lungs 2 (two) times daily.  Dispense: 3 Inhaler; Refill: 3 - albuterol (PROVENTIL) (2.5 MG/3ML) 0.083% nebulizer solution; Take 3 mLs (2.5 mg total) by  nebulization every 6 (six) hours as needed for wheezing or shortness of breath.  Dispense: 75 mL; Refill: 12 - albuterol (PROVENTIL HFA;VENTOLIN HFA) 108 (90 BASE) MCG/ACT inhaler; Inhale 2 puffs into the lungs every 6 (six) hours as needed for wheezing or shortness of breath.  Dispense: 3 Inhaler; Refill: 3   Patient have been counseled extensively about nutrition and exercise Return in about 3 months (around 06/19/2015) for Follow up Pain and comorbidities, Heart Failure and Hypertension.  The patient was given clear instructions to go to ER or return to medical center if symptoms don't improve, worsen or new problems develop. The patient verbalized understanding. The patient was told to call to get lab results if they haven't heard anything in the next week.  This note has been created with Surveyor, quantity. Any transcriptional errors are unintentional.    Angelica Chessman, MD, [place], [place], [place], [place] and [place], [place]   03/19/2015, 3:55 PM

## 2015-03-19 NOTE — Telephone Encounter (Signed)
P.O.F. GENERATED

## 2015-03-19 NOTE — Progress Notes (Signed)
Patient here for mobility exam for a hoveround And general physical

## 2015-03-20 ENCOUNTER — Ambulatory Visit: Payer: Medicaid Other | Admitting: Gastroenterology

## 2015-03-23 ENCOUNTER — Other Ambulatory Visit: Payer: Self-pay | Admitting: *Deleted

## 2015-03-23 ENCOUNTER — Telehealth: Payer: Self-pay | Admitting: *Deleted

## 2015-03-23 NOTE — [facility] Oncology Navigator Note (Signed)
Added patient to GI Tumor Board for 03/25/15 at request of Dr. Angelia Mould. Oscar Swanson. POF to scheduler to make change in his appointment from 6/9 to 5/25.

## 2015-03-23 NOTE — Telephone Encounter (Signed)
Spoke with sister, Oscar Swanson and she agreed to appointment change for Oscar Swanson for 03/25/15 at 11:30 from the June visit.

## 2015-03-24 ENCOUNTER — Telehealth: Payer: Self-pay | Admitting: *Deleted

## 2015-03-24 NOTE — Telephone Encounter (Signed)
Left message at mobile # & sister's home # to see if pt could come in @ 12 noon tomorrow instead of 1:45 pm & asked that he call back to verify.

## 2015-03-25 ENCOUNTER — Telehealth: Payer: Self-pay | Admitting: Hematology

## 2015-03-25 ENCOUNTER — Ambulatory Visit: Payer: Medicaid Other | Admitting: Hematology

## 2015-03-25 NOTE — Telephone Encounter (Signed)
pt cld wanted to r/s appt-gave pt updated time & date

## 2015-03-25 NOTE — Telephone Encounter (Signed)
Received call from sister stating that she can not get pt here today for appt b/c she doesn't have a car.  Transferred to scheduler to r/s.

## 2015-03-31 ENCOUNTER — Telehealth: Payer: Self-pay | Admitting: Internal Medicine

## 2015-03-31 NOTE — Telephone Encounter (Signed)
Jasmine from [facility] calling to follow up on forms that were faxed on 03/18/15 including progress notes for visit on 03/19/15 and work order prescription. Please f/u with [facility].

## 2015-04-09 ENCOUNTER — Ambulatory Visit: Payer: Medicaid Other | Admitting: Hematology

## 2015-04-10 ENCOUNTER — Ambulatory Visit (HOSPITAL_BASED_OUTPATIENT_CLINIC_OR_DEPARTMENT_OTHER): Payer: Medicaid Other | Admitting: Hematology

## 2015-04-10 ENCOUNTER — Telehealth: Payer: Self-pay | Admitting: Hematology

## 2015-04-10 ENCOUNTER — Encounter: Payer: Self-pay | Admitting: Hematology

## 2015-04-10 ENCOUNTER — Ambulatory Visit (HOSPITAL_BASED_OUTPATIENT_CLINIC_OR_DEPARTMENT_OTHER): Payer: Medicaid Other

## 2015-04-10 VITALS — BP 127/67 | HR 81 | Temp 98.0°F | Resp 18 | Ht 68.0 in | Wt 229.8 lb

## 2015-04-10 DIAGNOSIS — K7460 Unspecified cirrhosis of liver: Secondary | ICD-10-CM | POA: Diagnosis not present

## 2015-04-10 DIAGNOSIS — K766 Portal hypertension: Secondary | ICD-10-CM | POA: Diagnosis not present

## 2015-04-10 DIAGNOSIS — D696 Thrombocytopenia, unspecified: Secondary | ICD-10-CM

## 2015-04-10 DIAGNOSIS — M79606 Pain in leg, unspecified: Secondary | ICD-10-CM | POA: Diagnosis not present

## 2015-04-10 DIAGNOSIS — R109 Unspecified abdominal pain: Secondary | ICD-10-CM

## 2015-04-10 DIAGNOSIS — C220 Liver cell carcinoma: Secondary | ICD-10-CM

## 2015-04-10 DIAGNOSIS — B1920 Unspecified viral hepatitis C without hepatic coma: Secondary | ICD-10-CM

## 2015-04-10 LAB — CBC & DIFF AND RETIC
BASO%: 0.5 % (ref 0.0–2.0)
Basophils Absolute: 0 10*3/uL (ref 0.0–0.1)
EOS%: 2.2 % (ref 0.0–7.0)
Eosinophils Absolute: 0.1 10*3/uL (ref 0.0–0.5)
HCT: 37.5 % — ABNORMAL LOW (ref 38.4–49.9)
HEMOGLOBIN: 12.8 g/dL — AB (ref 13.0–17.1)
IMMATURE RETIC FRACT: 7 % (ref 3.00–10.60)
LYMPH%: 17.3 % (ref 14.0–49.0)
MCH: 32.2 pg (ref 27.2–33.4)
MCHC: 34.1 g/dL (ref 32.0–36.0)
MCV: 94.5 fL (ref 79.3–98.0)
MONO#: 1.1 10*3/uL — ABNORMAL HIGH (ref 0.1–0.9)
MONO%: 18.1 % — ABNORMAL HIGH (ref 0.0–14.0)
NEUT#: 3.6 10*3/uL (ref 1.5–6.5)
NEUT%: 61.9 % (ref 39.0–75.0)
Platelets: 81 10*3/uL — ABNORMAL LOW (ref 140–400)
RBC: 3.97 10*6/uL — ABNORMAL LOW (ref 4.20–5.82)
RDW: 18.4 % — AB (ref 11.0–14.6)
Retic %: 2.11 % — ABNORMAL HIGH (ref 0.80–1.80)
Retic Ct Abs: 83.77 10*3/uL (ref 34.80–93.90)
WBC: 5.9 10*3/uL (ref 4.0–10.3)
lymph#: 1 10*3/uL (ref 0.9–3.3)

## 2015-04-10 LAB — COMPREHENSIVE METABOLIC PANEL (CC13)
ALT: 38 U/L (ref 0–55)
ANION GAP: 7 meq/L (ref 3–11)
AST: 124 U/L — AB (ref 5–34)
Albumin: 2.4 g/dL — ABNORMAL LOW (ref 3.5–5.0)
Alkaline Phosphatase: 147 U/L (ref 40–150)
BILIRUBIN TOTAL: 1.73 mg/dL — AB (ref 0.20–1.20)
BUN: 11.5 mg/dL (ref 7.0–26.0)
CO2: 22 mEq/L (ref 22–29)
Calcium: 8.8 mg/dL (ref 8.4–10.4)
Chloride: 104 mEq/L (ref 98–109)
Creatinine: 0.8 mg/dL (ref 0.7–1.3)
GLUCOSE: 108 mg/dL (ref 70–140)
Potassium: 4.7 mEq/L (ref 3.5–5.1)
SODIUM: 133 meq/L — AB (ref 136–145)
Total Protein: 7 g/dL (ref 6.4–8.3)

## 2015-04-10 MED ORDER — OXYCODONE HCL 5 MG PO TABS: 5.0000 mg | ORAL_TABLET | ORAL | Status: DC | PRN

## 2015-04-10 NOTE — Progress Notes (Signed)
[facility] OFFICE PROGRESS NOTE  Patient Care Team: Tresa Garter, MD as PCP - General (Internal Medicine) Milus Banister, MD as Attending Physician (Gastroenterology)  SUMMARY OF ONCOLOGIC HISTORY:   Oscar Swanson (hepatocellular carcinoma) T1N0M0 stage I   08/27/2014 Initial Diagnosis HCC (hepatocellular carcinoma)   08/27/2014 Imaging liver MRI W/WO iv contrast showed liver cirrhosis and a 4.9cm mass in right lobe liver, seg 7, with typical image characteristics of [place]. also found a 1.9cm hemangioma in segment 3 of liver.  AFP was 1044 on 08/15/14.    OTHER RELATED ISSUES: 1. Untreated Hep C 2. Liver cirrhosis secondary to hepatitis C and alcohol abuse, with portal hypertension, ascites s/p TIP, history of hepatic encephalopathy in January 2014. Child-Pugh score is 10 (class C)  INTERVAL HISTORY: Oscar Swanson returns for follow-up. He presents to the clinic with his sister and brother-in-law. He is clinically stable, does have intermittent leg and abdominal pain, takes oxycodone as needed, average 1-2 tab a day. He lives with other housemates, able to take care of himself and tolerates routine daily activities. He still drinks a few beer sometime. His repeated liver MRI unfortunately showed disease progression. He was seen by interventional radiologist at Dr. Anselm Pancoast last month, who does not feel he is a candidate for second liver targeted therapy.    REVIEW OF SYSTEMS:   Constitutional: Denies fevers, chills or abnormal weight loss, (+) fatigue  Eyes: Denies blurriness of vision Ears, nose, mouth, throat, and face: Denies mucositis or sore throat Respiratory: Denies cough, dyspnea or wheezes Cardiovascular: Denies palpitation, chest discomfort or lower extremity swelling Gastrointestinal:  Denies nausea, heartburn or change in bowel habits, (+) bdominal distention Skin: Denies abnormal skin rashes Lymphatics: Denies new lymphadenopathy or easy bruising Neurological:Denies  numbness, tingling or new weaknesses Behavioral/Psych: Mood is stable, no new changes  All other systems were reviewed with the patient and are negative.  MEDICAL HISTORY:  Past Medical History  Diagnosis Date  . Diabetes mellitus without complication   . Schizophrenia     onset age 37.  in and out of psych facilities from age 89 to age 37.  s/p electroconvulsive therapy.  . TB (tuberculosis)     sister says this dx'd at age 79 and treated  . Hepatic encephalopathy   . Esophageal varices   . GI bleed   . Cirrhosis   . Thrombocytopenia   . Alcohol abuse   . Mild Coagulopathy 11/08/2013  . Hepatitis C 12/02/2013  . Smoking 09/23/2013    SURGICAL HISTORY: Past Surgical History  Procedure Laterality Date  . Cataract surgery  Bilateral   . Esophagogastroduodenoscopy N/A 11/08/2013    Procedure: ESOPHAGOGASTRODUODENOSCOPY (EGD);  Surgeon: Milus Banister, MD;  Location: [facility];  Service: Endoscopy;  Laterality: N/A;  bedside  . Radiology with anesthesia N/A 12/26/2013    Procedure: Transjugular Intrahepatic Portosystemic Shunt (TIPS);  Surgeon: Carylon Perches, MD;  Location: [facility];  Service: Radiology;  Laterality: N/A;    I have reviewed the social history and family history with the patient and they are unchanged from previous note.  ALLERGIES:  is allergic to haldol.  MEDICATIONS:  Current Outpatient Prescriptions  Medication Sig Dispense Refill  . albuterol (PROVENTIL HFA;VENTOLIN HFA) 108 (90 BASE) MCG/ACT inhaler Inhale 2 puffs into the lungs every 6 (six) hours as needed for wheezing or shortness of breath. 3 Inhaler 3  . albuterol (PROVENTIL) (2.5 MG/3ML) 0.083% nebulizer solution Take 3 mLs (2.5 mg total) by nebulization every  6 (six) hours as needed for wheezing or shortness of breath. 75 mL 12  . aspirin 325 MG tablet Take 325 mg by mouth as needed.    . beclomethasone (QVAR) 80 MCG/ACT inhaler Inhale 2 puffs into the lungs 2 (two) times daily. 3 Inhaler 3  . folic acid  (FOLVITE) 1 MG tablet Take 1 tablet (1 mg total) by mouth daily. 90 tablet 3  . furosemide (LASIX) 40 MG tablet Take 1 tablet (40 mg total) by mouth 2 (two) times daily. 30 tablet 6  . lactulose (CHRONULAC) 10 GM/15ML solution Take 30 mLs (20 g total) by mouth 2 (two) times daily. Take 20 G (30 ml) at breakfast and take 20 G (30 ml) at lunch. 240 mL 3  . pantoprazole (PROTONIX) 40 MG tablet Take 1 tablet (40 mg total) by mouth daily. 30 tablet 6  . rifaximin (XIFAXAN) 550 MG TABS tablet Take 1 tablet (550 mg total) by mouth 2 (two) times daily. 60 tablet 6  . spironolactone (ALDACTONE) 100 MG tablet Take 1 tablet (100 mg total) by mouth 2 (two) times daily. 60 tablet 6  . thiamine (VITAMIN B-1) 100 MG tablet Take 1 tablet (100 mg total) by mouth daily. 90 tablet 3  . ENSURE (ENSURE) Take 1 Can by mouth 2 (two) times daily between meals. (Patient not taking: Reported on 04/10/2015) 237 mL 12  . oxyCODONE (OXY IR/ROXICODONE) 5 MG immediate release tablet Take 1 tablet (5 mg total) by mouth every 3 (three) hours as needed for moderate pain or severe pain. (Patient not taking: Reported on 04/10/2015) 60 tablet 0   No current facility-administered medications for this visit.    PHYSICAL EXAMINATION: ECOG PERFORMANCE STATUS: 1 - Symptomatic but completely ambulatory  Filed Vitals:   04/10/15 1359  BP: 127/67  Pulse: 81  Temp: 98 F (36.7 C)  Resp: 18   Filed Weights   04/10/15 1359  Weight: 229 lb 12.8 oz (104.237 kg)   GENERAL:alert, no distress and comfortable SKIN: skin color, texture, turgor are normal, no rashes or significant lesions EYES: normal, Conjunctiva are pink and non-injected, sclera clear OROPHARYNX:no exudate, no erythema and lips, buccal mucosa, and tongue normal  NECK: supple, thyroid normal size, non-tender, without nodularity LYMPH:  no palpable lymphadenopathy in the cervical, axillary or inguinal LUNGS: clear to auscultation and percussion with normal breathing  effort HEART: regular rate & rhythm and no murmurs and no lower extremity edema ABDOMEN:abdomen soft, mildly distended, non-tender,  and normal bowel sounds Musculoskeletal:no cyanosis of digits and no clubbing  NEURO: alert & oriented x 3 with fluent speech, no focal motor/sensory deficits  LABORATORY DATA:  I have reviewed the data as listed CBC Latest Ref Rng 04/10/2015 02/17/2015 01/14/2015  WBC 4.0 - 10.3 10e3/uL 5.9 4.2 3.7(L)  Hemoglobin 13.0 - 17.1 g/dL 12.8(L) 14.2 13.4  Hematocrit 38.4 - 49.9 % 37.5(L) 42.2 41.8  Platelets 140 - 400 10e3/uL 81(L) 70.0(L) 66(L)    CMP Latest Ref Rng 04/10/2015 02/17/2015 01/14/2015  Glucose 70 - 140 mg/dl 108 86 118  BUN 7.0 - 26.0 mg/dL 11.5 11 11.5  Creatinine 0.7 - 1.3 mg/dL 0.8 0.83 0.8  Sodium 136 - 145 mEq/L 133(L) 135 137  Potassium 3.5 - 5.1 mEq/L 4.7 3.8 4.0  Chloride 96 - 112 mEq/L - 106 -  CO2 22 - 29 mEq/L 22 24 21(L)  Calcium 8.4 - 10.4 mg/dL 8.8 9.3 8.8  Total Protein 6.4 - 8.3 g/dL 7.0 7.5 6.9  Total Bilirubin  0.20 - 1.20 mg/dL 1.73(H) 2.1(H) 2.45(H)  Alkaline Phos 40 - 150 U/L 147 144(H) 136  AST 5 - 34 U/L 124(H) 128(H) 100(H)  ALT 0 - 55 U/L 38 48 35   AFP tumor marker  Status: Finalresult Visible to patient:  Not Released Nextappt: Today at 01:45 PM in Oncology Burr Medico, Krista Blue, MD) Dx:  Hepatocellular carcinoma              Ref Range 32mo ago (01/14/15) 59mo ago (09/17/14) 60mo ago (09/17/14)    AFP-Tumor Marker <6.1 ng/mL 21290.0 (H) 3179.3 (H)R, CM 2892.5 (H)CM          RADIOGRAPHIC STUDIES: IR 10/09/2014 EXAM: 1. ULTRASOUND GUIDANCE FOR VASCULAR ACCESS OF THE RIGHT COMMON FEMORAL ARTERY 2. MESENTERIC ARTERIOGRAPHY WITH SELECTIVE CATHETERIZATION OF THE CELIAC AXIS AND SUPERIOR MESENTERIC ARTERY 3. SELECTIVE CATHETERIZATION AND ANGIOGRAPHY OF THE RIGHT HEPATIC ARTERY AND THREE BRANCHES OF THE RIGHT HEPATIC ARTERY. 4. TRANSCATHETER CHEMOEMBOLIZATION WITH DRUG-ELUTING BEADS VIA A RIGHT HEPATIC  ARTERY BRANCH 5. FOLLOW-UP ANGIOGRAPHY AFTER CHEMOEMBOLIZATION Physician: Stephan Minister. Henn, MD  Abdominal MRI with and without contrast 03/17/2015 IMPRESSION: 1. Dominant mass in the posterior right hepatic lobe is increased in size consistent with progression of hepatocellular carcinoma. 2. Multiple nodules in the right hepatic lobe are indeterminate. Differential includes dysplastic nodules versus small hepatocellular carcinoma, favor dysplastic nodules as above. Recommend attention on follow-up. Of note exam is degraded by patient respiratory motion. 3. Tips shunt in place without ascites.   ASSESSMENT & PLAN:  Oscar Swanson is a 54 year old Caucasian male with untreated hepatitis C, liver cirrhosis with portal hypertension, ascites status post TIP, history of GI bleeding problems and hepatic encephalopathy. His Child-Pugh score is 10 (class C), although he recovers pretty well with medical management for his liver cirrhosis. He has a 4.9 cm liver mass with typical image findings consistent with HCC, and elevated AFP at 1044, so his diagnosis of [place] is quite certain. CT chest and bone scan was negative for distant mets. He has T1N0M0 stage I HCC, s/p TACE on 10/09/2014, but unfortunately restaging scan on 03/17/2015 showed the liver mass has increased in size, no other new lesions, and a significant increase of his AFP level, consistent with disease progression.   Plan -His lab today showed stable elevated AST, bilirubin slightly down to 1.7 today, his AFP still pending. He has a stable thrombocytopenia. -Giving his slightly improved bilirubin, I'll contact Dr. Anselm Pancoast again to see if he would consider Y 90  for his progressed liver cancer.  His case was discussed in our tumor board a few weeks ago, it was felt he potentially still be a candidate for Y 90 if his liver function is sufficient.  -I discussed the option of sorafenib if he is not a candidate for liver targeted therapy.  However given his  poor liver function, compliance issue, he is not a great candidate for sorafenib either. Potential side effects of fatigue, skin rash, worsening liver function, nausea, diarrhea, etc. were discussed with patient.   I'll call patient and his sister after speaking with Dr.  Anselm Pancoast. Return to clinic for follow-up in one month.   I spent 20 minutes counseling the patient face to face. The total time spent in the appointment was 25 minutes and more than 50% was on counseling and review of test results      Truitt Merle, MD 04/10/2015 7:01 PM

## 2015-04-10 NOTE — Telephone Encounter (Signed)
Patient sent back to lab and given avs report and appointments for July.

## 2015-04-11 LAB — AFP TUMOR MARKER: AFP-Tumor Marker: 20284 ng/mL — ABNORMAL HIGH

## 2015-04-13 ENCOUNTER — Telehealth: Payer: Self-pay | Admitting: Hematology

## 2015-04-13 ENCOUNTER — Other Ambulatory Visit (HOSPITAL_COMMUNITY): Payer: Self-pay | Admitting: Diagnostic Radiology

## 2015-04-13 ENCOUNTER — Telehealth: Payer: Self-pay | Admitting: *Deleted

## 2015-04-13 DIAGNOSIS — C220 Liver cell carcinoma: Secondary | ICD-10-CM

## 2015-04-13 NOTE — Telephone Encounter (Signed)
Patient's sister Kate [address] requesting his lab results. I called her back, discussed the lab results with her. His liver functions slightly improved, bilirubin 1.73, I have contacted interventional radiologist Dr. Moises Blood office regarding second embolization, he will see patient in the near future.  Truitt Merle  04/13/2015

## 2015-04-13 NOTE — Telephone Encounter (Signed)
Received call from sister  Kate Sable requesting pt's lab results done 04/10/15. [employer]    [phone].

## 2015-04-15 ENCOUNTER — Ambulatory Visit
Admission: RE | Admit: 2015-04-15 | Discharge: 2015-04-15 | Disposition: A | Discharge status: Home / Self Care | Payer: Medicaid Other | Source: Ambulatory Visit | Attending: Diagnostic Radiology | Admitting: Diagnostic Radiology

## 2015-04-15 ENCOUNTER — Other Ambulatory Visit: Payer: Self-pay | Admitting: Diagnostic Radiology

## 2015-04-15 ENCOUNTER — Telehealth: Payer: Self-pay | Admitting: Internal Medicine

## 2015-04-15 DIAGNOSIS — C220 Liver cell carcinoma: Secondary | ICD-10-CM

## 2015-04-15 NOTE — Telephone Encounter (Signed)
Tina from [facility] falling to follow up on paperwork sent on 03/18/15 for mobility screening on 03/19/15. Please f/u with [employer].

## 2015-04-15 NOTE — Consult Note (Signed)
Chief Complaint: Chief Complaint  Patient presents with  . Follow-up    s/p DEB-TACE/TIPS      Referring Physician(s): Burr Medico, MD  History of Present Illness: Oscar Swanson is a 54 y.o. male with hepatitis C, cirrhosis, status post TIPS procedure and hepatocellular carcinoma. The patient underwent DEB-TACE on 10/10/2014. Patient had follow-up imaging on 03/17/2015 which demonstrated marked enlargement of the dominant lesion with additional suspicious lesions which could either be dysplastic nodules or small hepatocellular carcinomas. In addition, the patient's alpha-fetoprotein level prior to treatment was 3179 and most recent level is 20,284. The patient tries to stay active and complains of some leg pain. He has occasional right abdominal pain which is intermittent. He continues to drink occasionally. He has a large beer every 4 days. Previously, the patient was having GI bleeding but that has resolved.  Past Medical History  Diagnosis Date  . Diabetes mellitus without complication   . Schizophrenia     onset age 42.  in and out of psych facilities from age 84 to age 72.  s/p electroconvulsive therapy.  . TB (tuberculosis)     sister says this dx'd at age 27 and treated  . Hepatic encephalopathy   . Esophageal varices   . GI bleed   . Cirrhosis   . Thrombocytopenia   . Alcohol abuse   . Mild Coagulopathy 11/08/2013  . Hepatitis C 12/02/2013  . Smoking 09/23/2013    Past Surgical History  Procedure Laterality Date  . Cataract surgery  Bilateral   . Esophagogastroduodenoscopy N/A 11/08/2013    Procedure: ESOPHAGOGASTRODUODENOSCOPY (EGD);  Surgeon: Milus Banister, MD;  Location: [facility];  Service: Endoscopy;  Laterality: N/A;  bedside  . Radiology with anesthesia N/A 12/26/2013    Procedure: Transjugular Intrahepatic Portosystemic Shunt (TIPS);  Surgeon: Carylon Perches, MD;  Location: [facility];  Service: Radiology;  Laterality: N/A;     Allergies: Haldol  Medications: Prior to Admission medications   Medication Sig Start Date End Date Taking? Authorizing Provider  albuterol (PROVENTIL HFA;VENTOLIN HFA) 108 (90 BASE) MCG/ACT inhaler Inhale 2 puffs into the lungs every 6 (six) hours as needed for wheezing or shortness of breath. 03/19/15  Yes Tresa Garter, MD  albuterol (PROVENTIL) (2.5 MG/3ML) 0.083% nebulizer solution Take 3 mLs (2.5 mg total) by nebulization every 6 (six) hours as needed for wheezing or shortness of breath. 03/19/15  Yes Tresa Garter, MD  aspirin 325 MG tablet Take 325 mg by mouth as needed.   Yes Historical Provider, MD  beclomethasone (QVAR) 80 MCG/ACT inhaler Inhale 2 puffs into the lungs 2 (two) times daily. 03/19/15  Yes Tresa Garter, MD  folic acid (FOLVITE) 1 MG tablet Take 1 tablet (1 mg total) by mouth daily. 03/19/15  Yes Tresa Garter, MD  furosemide (LASIX) 40 MG tablet Take 1 tablet (40 mg total) by mouth 2 (two) times daily. 03/19/15  Yes Tresa Garter, MD  lactulose (CHRONULAC) 10 GM/15ML solution Take 30 mLs (20 g total) by mouth 2 (two) times daily. Take 20 G (30 ml) at breakfast and take 20 G (30 ml) at lunch. 03/19/15  Yes Tresa Garter, MD  oxyCODONE (OXY IR/ROXICODONE) 5 MG immediate release tablet Take 1 tablet (5 mg total) by mouth every 3 (three) hours as needed for moderate pain or severe pain. 04/10/15  Yes Truitt Merle, MD  pantoprazole (PROTONIX) 40 MG tablet Take 1 tablet (40 mg total) by mouth daily. 03/19/15  Yes  Tresa Garter, MD  rifaximin (XIFAXAN) 550 MG TABS tablet Take 1 tablet (550 mg total) by mouth 2 (two) times daily. 08/15/14  Yes Milus Banister, MD  spironolactone (ALDACTONE) 100 MG tablet Take 1 tablet (100 mg total) by mouth 2 (two) times daily. 03/19/15  Yes Tresa Garter, MD  thiamine (VITAMIN B-1) 100 MG tablet Take 1 tablet (100 mg total) by mouth daily. 03/19/15  Yes Olugbemiga Essie Christine, MD  ENSURE (ENSURE) Take 1 Can by  mouth 2 (two) times daily between meals. Patient not taking: Reported on 04/10/2015 09/16/14   Milus Banister, MD     Family History  Problem Relation Age of Onset  . Hypertension Mother   . Colon cancer Neg Hx   . Lung cancer Mother     smoker  . Colon polyps Sister     twin sister    History   Social History  . Marital Status: Single    Spouse Name: N/A  . Number of Children: 0  . Years of Education: N/A   Social History Main Topics  . Smoking status: Current Some Day Smoker -- 0.50 packs/day for 25 years    Types: Cigarettes  . Smokeless tobacco: Never Used     Comment: Tobacco information given 02/10/14  . Alcohol Use: 0.0 oz/week    0 Standard drinks or equivalent per week     Comment:  drink beer socially/per patient 1 beer qod    . Drug Use: No     Comment: not currently  . Sexual Activity: Not on file   Other Topics Concern  . None   Social History Narrative    ECOG Status: 1 - Symptomatic but completely ambulatory  Review of Systems: A 12 point ROS discussed and pertinent positives are indicated in the HPI above.  All other systems are negative.  Review of Systems  Constitutional: Negative.   Respiratory: Negative.   Cardiovascular: Negative.   Gastrointestinal: Positive for abdominal pain.    Vital Signs: BP 124/88 mmHg  Pulse 74  Temp(Src) 98.2 F (36.8 C) (Oral)  Resp 14  Ht 5\' 8"  (1.727 m)  Wt 229 lb (103.874 kg)  BMI 34.83 kg/m2  SpO2 97%  Physical Exam  Abdominal: Soft. He exhibits no distension. There is no tenderness.       Imaging: Mr Abdomen W Wo Contrast  03/17/2015   CLINICAL DATA:  Cirrhosis with hepatocellular carcinoma. Patient status post TIPS shunt.  EXAM: MRI ABDOMEN WITHOUT AND WITH CONTRAST  TECHNIQUE: Multiplanar multisequence MR imaging of the abdomen was performed both before and after the administration of intravenous contrast.  CONTRAST:  11 mL Eovist  COMPARISON:  MRI 08/27/2014  FINDINGS: Lower chest:  Lung  bases are clear.  Hepatobiliary: The dominant lesion the posterior right hepatic lobe is increased in size measuring 5.5 x 5.2 cm (image 30, series 9) increased from 3.7 x 2.9 cm on MRI 08/27/2014. Again demonstrated enhancing hemangioma in the anterior aspect the left hepatic lobe measuring 17 mm on image 31, series 9. There is increased nodular heterogeneity within the a liver parenchyma. On the delayed 20 minutes images there are several foci of low intensity with differential including dysplastic nodules versus HCC. Favor dysplastic nodules as there is no clear enhancement of these lesions. Of note, the immediate T1 postcontrast images were degraded by respiratory motion. There several high T1 signal nodules within liver and parenchyma on the precontrast images consistent with dysplastic nodules or small HCC.  The liver has a nodular contour. Tips shunt in place. There is no ascites.  Pancreas: Normal pancreatic parenchymal intensity. No ductal dilatation or inflammation.  Spleen: Spleen is moderately enlarged similar prior.  Adrenals/urinary tract: Adrenal glands and kidneys are normal.  Stomach/Bowel: Stomach and limited of the small bowel is unremarkable  Vascular/Lymphatic: Abdominal aortic normal caliber. No retroperitoneal periportal lymphadenopathy.  Musculoskeletal: No aggressive osseous lesion  IMPRESSION: 1. Dominant mass in the posterior right hepatic lobe is increased in size consistent with progression of hepatocellular carcinoma. 2. Multiple nodules in the right hepatic lobe are indeterminate. Differential includes dysplastic nodules versus small hepatocellular carcinoma, favor dysplastic nodules as above. Recommend attention on follow-up. Of note exam is degraded by patient respiratory motion. 3. Tips shunt in place without ascites.   Electronically Signed   By: Suzy Bouchard M.D.   On: 03/17/2015 17:04    Labs:  CBC:  Recent Labs  10/16/14 0752 01/14/15 1402 02/17/15 0841  04/10/15 1517  WBC 8.3 3.7* 4.2 5.9  HGB 13.1 13.4 14.2 12.8*  HCT 40.1 41.8 42.2 37.5*  PLT 69* 66* 70.0* 81*    COAGS:  Recent Labs  10/09/14 0755 10/16/14 0752 01/14/15 1402 02/17/15 0841  INR 1.57* 1.40* 1.40* 1.4*    BMP:  Recent Labs  09/16/14 1555 10/09/14 0755 10/10/14 0501 10/16/14 0752 01/14/15 1402 02/17/15 0841 04/10/15 1518  NA 135 134* 136* 131* 137 135 133*  K 4.2 3.9 4.6 4.3 4.0 3.8 4.7  CL 107 104 105  --   --  106  --   CO2 24 21 24 22  21* 24 22  GLUCOSE 68* 103* 98 127 118 86 108  BUN 14 10 11  14.4 11.5 11 11.5  CALCIUM 9.0 8.9 8.8 8.8 8.8 9.3 8.8  CREATININE 0.9 0.80 1.03 0.9 0.8 0.83 0.8  GFRNONAA  --  >90 82*  --   --   --   --   GFRAA  --  >90 >90  --   --   --   --     LIVER FUNCTION TESTS:  Recent Labs  10/16/14 0752 01/14/15 1402 02/17/15 0841 04/10/15 1518  BILITOT 2.21* 2.45* 2.1* 1.73*  AST 87* 100* 128* 124*  ALT 57* 35 48 38  ALKPHOS 150 136 144* 147  PROT 7.5 6.9 7.5 7.0  ALBUMIN 2.7* 2.5* 3.0* 2.4*    TUMOR MARKERS:  Recent Labs  09/17/14 1238 01/14/15 1402 04/10/15 1517  AFPTM 2892.5*  3179.3* 21290.0* 98921.1*    Assessment and Plan:  54 year old with hepatitis C and hepatocellular carcinoma. The most recent MRI demonstrates progression of the large liver lesion in segment 7. There are additional suspicious nodules which could represent dysplastic nodules or small hepatocellular carcinomas. Patient's total bilirubin has slightly improved and now measures 1.7.  I discussed liver directed treatment options with the patient. The largest liver lesion is too big for thermal ablation. The treatment options come down to catheter-directed bland embolization, chemoembolization or radioembolization. The benefits of radioembolization would be global treatment of the liver, at least one lobe at a time. However, I am concerned about the patient's fluctuating liver function and using Y 90. The advantage of using bland  embolization or chemoembolization would be more directed treatment to the areas of concern and decreasing the risk of liver toxicity. Although there are multiple suspicious nodules throughout the liver, I would prefer catheter-directed chemoembolization to the large dominant lesion in segment 7 for more directed embolization. The patient is aware  of the DEB-TACE procedure and the overnight recovery. The patient and his sister are agreeable to this procedure. We will try to schedule his procedure in the near future.   SignedCarylon Perches 04/15/2015, 4:49 PM   I spent a total of   15 Minutes in face to face in clinical consultation, greater than 50% of which was counseling/coordinating care for hepatocellular carcinoma.

## 2015-04-17 ENCOUNTER — Telehealth (HOSPITAL_COMMUNITY): Payer: Self-pay | Admitting: Radiology

## 2015-04-17 NOTE — Telephone Encounter (Signed)
Rec'd call from pt's family confirming arrival time for IR procedure scheduled for 04/30/15.  Arrive at 0630, procedure to start at 0830 and will last approximately 2 hours.

## 2015-04-22 ENCOUNTER — Telehealth: Payer: Self-pay | Admitting: Internal Medicine

## 2015-04-22 ENCOUNTER — Other Ambulatory Visit: Payer: Self-pay | Admitting: Diagnostic Radiology

## 2015-04-22 NOTE — Telephone Encounter (Signed)
Jazmin from [employer] called checking on status of paperwork for [employer]. Please f/u

## 2015-04-23 ENCOUNTER — Other Ambulatory Visit: Payer: Self-pay | Admitting: Diagnostic Radiology

## 2015-04-23 MED ORDER — DOXORUBICIN HCL 50 MG IV SOLR: 150.0000 mg | Freq: Once | INTRAVENOUS | Status: DC

## 2015-04-27 ENCOUNTER — Other Ambulatory Visit: Payer: Self-pay | Admitting: Radiology

## 2015-04-27 ENCOUNTER — Other Ambulatory Visit: Payer: Self-pay | Admitting: Diagnostic Radiology

## 2015-04-27 ENCOUNTER — Telehealth: Payer: Self-pay | Admitting: Internal Medicine

## 2015-04-27 MED ORDER — DOXORUBICIN HCL 50 MG IV SOLR
150.0000 mg | Freq: Once | INTRAVENOUS | Status: AC
  Administered 2015-04-30: 150 mg via INTRA_ARTERIAL
  Filled 2015-04-27 (×2): qty 150

## 2015-04-27 NOTE — Telephone Encounter (Signed)
[employer] called requesting paperwork, rep states paperwork needs to be sent in in 5 days or patient will have to mobility exam. Please f/u

## 2015-04-28 ENCOUNTER — Other Ambulatory Visit: Payer: Self-pay | Admitting: Physician Assistant

## 2015-04-28 ENCOUNTER — Telehealth: Payer: Self-pay | Admitting: Internal Medicine

## 2015-04-28 NOTE — Telephone Encounter (Signed)
Patient called to check status of paperwork for [employer], patient states [employer] called him stating that he will need to send in paperwork in 5 days if not patient will have to come to have mobility exam done again. Please f/u with patient

## 2015-04-28 NOTE — Telephone Encounter (Signed)
Called pt to schedule an appointment to have his Mobility Exam for [employer] per Dr. Doreene Burke, LVM for pt. To call back and schedule for 6/30 or 7/7.

## 2015-04-28 NOTE — Telephone Encounter (Signed)
Patient called back and appointment was scheduled, [employer] was also notified of appointment and they will fax over new forms

## 2015-04-29 ENCOUNTER — Other Ambulatory Visit: Payer: Self-pay | Admitting: Radiology

## 2015-04-29 NOTE — Telephone Encounter (Signed)
Dr Doreene Burke has the paperwork and has instructed the staff That the patient needs to schedule an appointment to come into the office

## 2015-04-30 ENCOUNTER — Encounter (HOSPITAL_COMMUNITY): Payer: Self-pay

## 2015-04-30 ENCOUNTER — Observation Stay (HOSPITAL_COMMUNITY)
Admission: RE | Admit: 2015-04-30 | Discharge: 2015-05-01 | Disposition: A | Discharge status: Home / Self Care | Payer: Medicaid Other | Source: Ambulatory Visit | Attending: Diagnostic Radiology | Admitting: Diagnostic Radiology

## 2015-04-30 ENCOUNTER — Ambulatory Visit (HOSPITAL_COMMUNITY)
Admission: RE | Admit: 2015-04-30 | Discharge: 2015-04-30 | Disposition: A | Discharge status: Home / Self Care | Payer: Medicaid Other | Source: Ambulatory Visit | Attending: Diagnostic Radiology | Admitting: Diagnostic Radiology

## 2015-04-30 DIAGNOSIS — B1920 Unspecified viral hepatitis C without hepatic coma: Secondary | ICD-10-CM | POA: Insufficient documentation

## 2015-04-30 DIAGNOSIS — Z8611 Personal history of tuberculosis: Secondary | ICD-10-CM | POA: Diagnosis not present

## 2015-04-30 DIAGNOSIS — D696 Thrombocytopenia, unspecified: Secondary | ICD-10-CM | POA: Insufficient documentation

## 2015-04-30 DIAGNOSIS — E119 Type 2 diabetes mellitus without complications: Secondary | ICD-10-CM | POA: Diagnosis not present

## 2015-04-30 DIAGNOSIS — F209 Schizophrenia, unspecified: Secondary | ICD-10-CM | POA: Diagnosis not present

## 2015-04-30 DIAGNOSIS — D689 Coagulation defect, unspecified: Secondary | ICD-10-CM | POA: Insufficient documentation

## 2015-04-30 DIAGNOSIS — Z7982 Long term (current) use of aspirin: Secondary | ICD-10-CM | POA: Insufficient documentation

## 2015-04-30 DIAGNOSIS — I8500 Esophageal varices without bleeding: Secondary | ICD-10-CM | POA: Insufficient documentation

## 2015-04-30 DIAGNOSIS — C220 Liver cell carcinoma: Principal | ICD-10-CM | POA: Diagnosis present

## 2015-04-30 DIAGNOSIS — K7460 Unspecified cirrhosis of liver: Secondary | ICD-10-CM | POA: Insufficient documentation

## 2015-04-30 DIAGNOSIS — F1010 Alcohol abuse, uncomplicated: Secondary | ICD-10-CM | POA: Diagnosis not present

## 2015-04-30 DIAGNOSIS — F17210 Nicotine dependence, cigarettes, uncomplicated: Secondary | ICD-10-CM | POA: Diagnosis not present

## 2015-04-30 LAB — COMPREHENSIVE METABOLIC PANEL
ALK PHOS: 112 U/L (ref 38–126)
ALT: 54 U/L (ref 17–63)
AST: 162 U/L — ABNORMAL HIGH (ref 15–41)
Albumin: 2.5 g/dL — ABNORMAL LOW (ref 3.5–5.0)
Anion gap: 7 (ref 5–15)
BILIRUBIN TOTAL: 2.9 mg/dL — AB (ref 0.3–1.2)
BUN: 14 mg/dL (ref 6–20)
CALCIUM: 8.6 mg/dL — AB (ref 8.9–10.3)
CO2: 24 mmol/L (ref 22–32)
CREATININE: 0.98 mg/dL (ref 0.61–1.24)
Chloride: 103 mmol/L (ref 101–111)
GLUCOSE: 105 mg/dL — AB (ref 65–99)
Potassium: 3.5 mmol/L (ref 3.5–5.1)
Sodium: 134 mmol/L — ABNORMAL LOW (ref 135–145)
TOTAL PROTEIN: 6.8 g/dL (ref 6.5–8.1)

## 2015-04-30 LAB — APTT: APTT: 35 s (ref 24–37)

## 2015-04-30 LAB — CBC
HCT: 36.6 % — ABNORMAL LOW (ref 39.0–52.0)
HEMOGLOBIN: 12 g/dL — AB (ref 13.0–17.0)
MCH: 30.7 pg (ref 26.0–34.0)
MCHC: 32.8 g/dL (ref 30.0–36.0)
MCV: 93.6 fL (ref 78.0–100.0)
PLATELETS: 62 10*3/uL — AB (ref 150–400)
RBC: 3.91 MIL/uL — ABNORMAL LOW (ref 4.22–5.81)
RDW: 17.7 % — ABNORMAL HIGH (ref 11.5–15.5)
WBC: 4.5 10*3/uL (ref 4.0–10.5)

## 2015-04-30 LAB — PROTIME-INR
INR: 1.44 (ref 0.00–1.49)
PROTHROMBIN TIME: 17.6 s — AB (ref 11.6–15.2)

## 2015-04-30 LAB — GLUCOSE, CAPILLARY: Glucose-Capillary: 88 mg/dL (ref 65–99)

## 2015-04-30 MED ORDER — FOLIC ACID 1 MG PO TABS
1.0000 mg | ORAL_TABLET | Freq: Every day | ORAL | Status: DC
  Administered 2015-04-30 – 2015-05-01 (×2): 1 mg via ORAL
  Filled 2015-04-30 (×2): qty 1

## 2015-04-30 MED ORDER — DIPHENHYDRAMINE HCL 50 MG/ML IJ SOLN
25.0000 mg | Freq: Once | INTRAMUSCULAR | Status: AC
  Administered 2015-04-30: 25 mg via INTRAVENOUS
  Filled 2015-04-30: qty 1

## 2015-04-30 MED ORDER — ONDANSETRON HCL 4 MG/2ML IJ SOLN: 4.0000 mg | Freq: Four times a day (QID) | INTRAMUSCULAR | Status: DC | PRN

## 2015-04-30 MED ORDER — PIPERACILLIN-TAZOBACTAM 3.375 G IVPB
3.3750 g | Freq: Once | INTRAVENOUS | Status: AC
  Administered 2015-04-30: 3.375 g via INTRAVENOUS
  Filled 2015-04-30: qty 50

## 2015-04-30 MED ORDER — SODIUM CHLORIDE 0.9 % IJ SOLN: 3.0000 mL | INTRAMUSCULAR | Status: DC | PRN

## 2015-04-30 MED ORDER — PANTOPRAZOLE SODIUM 40 MG PO TBEC
40.0000 mg | DELAYED_RELEASE_TABLET | Freq: Every day | ORAL | Status: DC
  Administered 2015-04-30 – 2015-05-01 (×2): 40 mg via ORAL
  Filled 2015-04-30 (×2): qty 1

## 2015-04-30 MED ORDER — SODIUM CHLORIDE 0.9 % IV SOLN: 250.0000 mL | INTRAVENOUS | Status: DC | PRN

## 2015-04-30 MED ORDER — DOCUSATE SODIUM 100 MG PO CAPS
100.0000 mg | ORAL_CAPSULE | Freq: Two times a day (BID) | ORAL | Status: DC
  Administered 2015-04-30 – 2015-05-01 (×2): 100 mg via ORAL
  Filled 2015-04-30 (×3): qty 1

## 2015-04-30 MED ORDER — IOHEXOL 300 MG/ML  SOLN
20.0000 mL | Freq: Once | INTRAMUSCULAR | Status: AC | PRN
  Administered 2015-04-30: 20 mL via INTRA_ARTERIAL

## 2015-04-30 MED ORDER — FENTANYL CITRATE (PF) 100 MCG/2ML IJ SOLN
INTRAMUSCULAR | Status: AC
  Filled 2015-04-30: qty 6

## 2015-04-30 MED ORDER — ALBUTEROL SULFATE (2.5 MG/3ML) 0.083% IN NEBU: 2.5000 mg | INHALATION_SOLUTION | Freq: Four times a day (QID) | RESPIRATORY_TRACT | Status: DC | PRN

## 2015-04-30 MED ORDER — LIDOCAINE HCL 1 % IJ SOLN
INTRAMUSCULAR | Status: AC
  Filled 2015-04-30: qty 20

## 2015-04-30 MED ORDER — VITAMIN B-1 100 MG PO TABS
100.0000 mg | ORAL_TABLET | Freq: Every day | ORAL | Status: DC
  Administered 2015-04-30 – 2015-05-01 (×2): 100 mg via ORAL
  Filled 2015-04-30 (×2): qty 1

## 2015-04-30 MED ORDER — OXYCODONE HCL 5 MG PO TABS
5.0000 mg | ORAL_TABLET | ORAL | Status: DC | PRN
  Administered 2015-04-30 – 2015-05-01 (×3): 5 mg via ORAL
  Filled 2015-04-30 (×3): qty 1

## 2015-04-30 MED ORDER — ALBUTEROL SULFATE HFA 108 (90 BASE) MCG/ACT IN AERS: 2.0000 | INHALATION_SPRAY | Freq: Four times a day (QID) | RESPIRATORY_TRACT | Status: DC | PRN

## 2015-04-30 MED ORDER — LACTULOSE 10 GM/15ML PO SOLN
20.0000 g | Freq: Two times a day (BID) | ORAL | Status: DC
  Administered 2015-04-30 – 2015-05-01 (×3): 20 g via ORAL
  Filled 2015-04-30 (×3): qty 30

## 2015-04-30 MED ORDER — SODIUM CHLORIDE 0.9 % IV SOLN
INTRAVENOUS | Status: DC
  Administered 2015-04-30 (×3): via INTRAVENOUS

## 2015-04-30 MED ORDER — FENTANYL CITRATE (PF) 100 MCG/2ML IJ SOLN
INTRAMUSCULAR | Status: AC | PRN
  Administered 2015-04-30 (×5): 50 ug via INTRAVENOUS

## 2015-04-30 MED ORDER — IOHEXOL 300 MG/ML  SOLN
50.0000 mL | Freq: Once | INTRAMUSCULAR | Status: AC | PRN
  Administered 2015-04-30: 50 mL via INTRAVENOUS

## 2015-04-30 MED ORDER — SPIRONOLACTONE 100 MG PO TABS
100.0000 mg | ORAL_TABLET | Freq: Two times a day (BID) | ORAL | Status: DC
  Administered 2015-04-30 – 2015-05-01 (×3): 100 mg via ORAL
  Filled 2015-04-30 (×5): qty 1

## 2015-04-30 MED ORDER — SODIUM CHLORIDE 0.9 % IV SOLN
8.0000 mg | Freq: Once | INTRAVENOUS | Status: AC
  Administered 2015-04-30: 8 mg via INTRAVENOUS
  Filled 2015-04-30: qty 4

## 2015-04-30 MED ORDER — DEXAMETHASONE SODIUM PHOSPHATE 4 MG/ML IJ SOLN
4.0000 mg | Freq: Once | INTRAMUSCULAR | Status: AC
  Administered 2015-04-30: 4 mg via INTRAVENOUS
  Filled 2015-04-30: qty 1

## 2015-04-30 MED ORDER — RIFAXIMIN 550 MG PO TABS
550.0000 mg | ORAL_TABLET | Freq: Two times a day (BID) | ORAL | Status: DC
  Administered 2015-04-30 – 2015-05-01 (×3): 550 mg via ORAL
  Filled 2015-04-30 (×4): qty 1

## 2015-04-30 MED ORDER — SODIUM CHLORIDE 0.9 % IJ SOLN
3.0000 mL | Freq: Two times a day (BID) | INTRAMUSCULAR | Status: DC
  Administered 2015-04-30 – 2015-05-01 (×3): 3 mL via INTRAVENOUS

## 2015-04-30 MED ORDER — IOHEXOL 300 MG/ML  SOLN
98.0000 mL | Freq: Once | INTRAMUSCULAR | Status: AC | PRN
  Administered 2015-04-30: 98 mL via INTRA_ARTERIAL

## 2015-04-30 MED ORDER — FUROSEMIDE 40 MG PO TABS
40.0000 mg | ORAL_TABLET | Freq: Two times a day (BID) | ORAL | Status: DC
  Administered 2015-04-30 – 2015-05-01 (×2): 40 mg via ORAL
  Filled 2015-04-30 (×4): qty 1

## 2015-04-30 NOTE — Procedures (Signed)
Successful transcatheter chemoembolization of the [place] with drug-eluting beads.  Minimal blood loss.  No immediate complication.  See full report in PACS.  Plan for overnight observation.

## 2015-04-30 NOTE — Plan of Care (Signed)
Problem: Phase I Progression Outcomes Goal: Pain controlled with appropriate interventions Outcome: Completed/Met Date Met:  04/30/15 PRN Oxy

## 2015-04-30 NOTE — H&P (Signed)
Chief Complaint: Hepatocellular carcinoma  Referring Physician(s): Burr Medico  History of Present Illness: Oscar Swanson is a 54 y.o. 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 Patient's most recent MRI of the abdomen in May 2016 reveals a dominant mass in the posterior right hepatic lobe which has increased in size as well as multiple additional smaller nodules in the right hepatic lobe which are indeterminate but suspicious for hepatocellular carcinomas. Patient's AFP has also increased since November 2015 and currently is at 20,284. He presents today following recent IR follow-up for additional chemoembolization (DEB-TACE) of the dominant segment 7 liver lesion.  Past Medical History  Diagnosis Date  . Diabetes mellitus without complication   . Schizophrenia     onset age 82.  in and out of psych facilities from age 86 to age 21.  s/p electroconvulsive therapy.  . TB (tuberculosis)     sister says this dx'd at age 63 and treated  . Hepatic encephalopathy   . Esophageal varices   . GI bleed   . Cirrhosis   . Thrombocytopenia   . Alcohol abuse   . Mild Coagulopathy 11/08/2013  . Hepatitis C 12/02/2013  . Smoking 09/23/2013    Past Surgical History  Procedure Laterality Date  . Cataract surgery  Bilateral   . Esophagogastroduodenoscopy N/A 11/08/2013    Procedure: ESOPHAGOGASTRODUODENOSCOPY (EGD);  Surgeon: Milus Banister, MD;  Location: [facility];  Service: Endoscopy;  Laterality: N/A;  bedside  . Radiology with anesthesia N/A 12/26/2013    Procedure: Transjugular Intrahepatic Portosystemic Shunt (TIPS);  Surgeon: Carylon Perches, MD;  Location: [facility];  Service: Radiology;  Laterality: N/A;    Allergies: Haldol  Medications: Prior to Admission medications   Medication Sig Start Date End Date Taking? Authorizing Provider  albuterol (PROVENTIL HFA;VENTOLIN HFA) 108 (90  BASE) MCG/ACT inhaler Inhale 2 puffs into the lungs every 6 (six) hours as needed for wheezing or shortness of breath. 03/19/15  Yes Tresa Garter, MD  albuterol (PROVENTIL) (2.5 MG/3ML) 0.083% nebulizer solution Take 3 mLs (2.5 mg total) by nebulization every 6 (six) hours as needed for wheezing or shortness of breath. 03/19/15  Yes Tresa Garter, MD  beclomethasone (QVAR) 80 MCG/ACT inhaler Inhale 2 puffs into the lungs 2 (two) times daily. 03/19/15  Yes Olugbemiga Essie Christine, MD  ENSURE (ENSURE) Take 1 Can by mouth 2 (two) times daily between meals. 09/16/14  Yes Milus Banister, MD  folic acid (FOLVITE) 1 MG tablet Take 1 tablet (1 mg total) by mouth daily. 03/19/15  Yes Tresa Garter, MD  furosemide (LASIX) 40 MG tablet Take 1 tablet (40 mg total) by mouth 2 (two) times daily. 03/19/15  Yes Tresa Garter, MD  lactulose (CHRONULAC) 10 GM/15ML solution Take 30 mLs (20 g total) by mouth 2 (two) times daily. Take 20 G (30 ml) at breakfast and take 20 G (30 ml) at lunch. 03/19/15  Yes Tresa Garter, MD  pantoprazole (PROTONIX) 40 MG tablet Take 1 tablet (40 mg total) by mouth daily. 03/19/15  Yes Tresa Garter, MD  rifaximin (XIFAXAN) 550 MG TABS tablet Take 1 tablet (550 mg total) by mouth 2 (two) times daily. 08/15/14  Yes Milus Banister, MD  spironolactone (ALDACTONE) 100 MG tablet Take 1 tablet (100 mg total) by mouth 2 (two) times daily. 03/19/15  Yes Tresa Garter, MD  thiamine (VITAMIN  B-1) 100 MG tablet Take 1 tablet (100 mg total) by mouth daily. 03/19/15  Yes Tresa Garter, MD  aspirin 325 MG tablet Take 325 mg by mouth daily as needed for moderate pain.     Historical Provider, MD  oxyCODONE (OXY IR/ROXICODONE) 5 MG immediate release tablet Take 1 tablet (5 mg total) by mouth every 3 (three) hours as needed for moderate pain or severe pain. 04/10/15   Truitt Merle, MD     Family History  Problem Relation Age of Onset  . Hypertension Mother   . Colon  cancer Neg Hx   . Lung cancer Mother     smoker  . Colon polyps Sister     twin sister    History   Social History  . Marital Status: Single    Spouse Name: N/A  . Number of Children: 0  . Years of Education: N/A   Social History Main Topics  . Smoking status: Current Some Day Smoker -- 0.50 packs/day for 25 years    Types: Cigarettes  . Smokeless tobacco: Never Used     Comment: Tobacco information given 02/10/14  . Alcohol Use: 0.0 oz/week    0 Standard drinks or equivalent per week     Comment:  drink beer socially/per patient 1 beer qod    . Drug Use: No     Comment: not currently  . Sexual Activity: Not on file   Other Topics Concern  . None   Social History Narrative      Review of Systems   Constitutional: Positive for fatigue. Negative for fever and chills.  HENT: Positive for sinus pressure.   Respiratory: Positive for cough.        Occasional dyspnea with exertion  Cardiovascular: Negative for chest pain.  Gastrointestinal: Positive for nausea, abdominal pain and blood in stool. Negative for vomiting.  Genitourinary: Negative for dysuria and hematuria.  Musculoskeletal: Positive for back pain.       Intermittent bilateral foot pain  Neurological: Negative for headaches.    Vital Signs: BP 107/56 mmHg  Pulse 64  Temp(Src) 98.2 F (36.8 C) (Oral)  Resp 16  SpO2 98%  Physical Exam  Constitutional: He is oriented to person, place, and time. He appears well-developed and well-nourished.  Appears older than stated age  Cardiovascular: Normal rate and regular rhythm.   Pulmonary/Chest: Effort normal and breath sounds normal.  Abdominal: Soft. Bowel sounds are normal.  Mild distention  Musculoskeletal: Normal range of motion. He exhibits no edema.  Neurological: He is alert and oriented to person, place, and time.    Mallampati Score:     Imaging: No results found.  Labs:  CBC:  Recent Labs  01/14/15 1402 02/17/15 0841 04/10/15 1517  04/30/15 0735  WBC 3.7* 4.2 5.9 4.5  HGB 13.4 14.2 12.8* 12.0*  HCT 41.8 42.2 37.5* 36.6*  PLT 66* 70.0* 81* 62*    COAGS:  Recent Labs  10/16/14 0752 01/14/15 1402 02/17/15 0841 04/30/15 0735  INR 1.40* 1.40* 1.4* 1.44  APTT  --   --   --  35    BMP:  Recent Labs  09/16/14 1555 10/09/14 0755 10/10/14 0501 10/16/14 0752 01/14/15 1402 02/17/15 0841 04/10/15 1518  NA 135 134* 136* 131* 137 135 133*  K 4.2 3.9 4.6 4.3 4.0 3.8 4.7  CL 107 104 105  --   --  106  --   CO2 24 21 24 22  21* 24 22  GLUCOSE 68* 103*  98 127 118 86 108  BUN 14 10 11  14.4 11.5 11 11.5  CALCIUM 9.0 8.9 8.8 8.8 8.8 9.3 8.8  CREATININE 0.9 0.80 1.03 0.9 0.8 0.83 0.8  GFRNONAA  --  >90 82*  --   --   --   --   GFRAA  --  >90 >90  --   --   --   --     LIVER FUNCTION TESTS:  Recent Labs  10/16/14 0752 01/14/15 1402 02/17/15 0841 04/10/15 1518  BILITOT 2.21* 2.45* 2.1* 1.73*  AST 87* 100* 128* 124*  ALT 57* 35 48 38  ALKPHOS 150 136 144* 147  PROT 7.5 6.9 7.5 7.0  ALBUMIN 2.7* 2.5* 3.0* 2.4*    TUMOR MARKERS:  Recent Labs  09/17/14 1238 01/14/15 1402 04/10/15 1517  AFPTM 2892.5*  3179.3* 21290.0* 70141.0*    Assessment and Plan: AARSH FRISTOE is a 54 y.o. 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 Patient's most recent MRI of the abdomen in May 2016 reveals a dominant mass in the posterior right hepatic lobe which has increased in size as well as multiple additional smaller nodules in the right hepatic lobe which are indeterminate but suspicious for hepatocellular carcinomas. Patient's AFP has also increased since November 2015 and currently is at 20,284. Total bilirubin has increased to 2.9. He presents today following recent IR follow-up for additional chemoembolization (DEB-TACE) of the dominant segment 7 liver lesion. Details/risks of procedure, including but not limited to  internal bleeding, infection/sepsis, kidney injury, liver failure, discussed with patient and sister with their understanding and consent. Following the procedure the patient will be admitted for overnight observation.     Signed: D. Rowe Robert 04/30/2015, 8:38 AM   I spent a total of 20 minutes in face to face in clinical consultation, greater than 50% of which was counseling/coordinating care for hepatic arterial chemoembolization

## 2015-04-30 NOTE — Progress Notes (Signed)
Entered pt room and informed him a foley catheter needed to place.  Pt refused foley.  IR was notified and they stated okay and just send the foley with pt to IR.

## 2015-04-30 NOTE — Progress Notes (Signed)
Patient ID: Oscar Swanson, male   DOB: 12-01-1960, 54 y.o.   MRN: [id number]    Referring Physician(s): Burr Medico  Subjective:  Pt without new c/o; eating lunch  Allergies: Haldol  Medications: Prior to Admission medications   Medication Sig Start Date End Date Taking? Authorizing Provider  albuterol (PROVENTIL HFA;VENTOLIN HFA) 108 (90 BASE) MCG/ACT inhaler Inhale 2 puffs into the lungs every 6 (six) hours as needed for wheezing or shortness of breath. 03/19/15  Yes Tresa Garter, MD  albuterol (PROVENTIL) (2.5 MG/3ML) 0.083% nebulizer solution Take 3 mLs (2.5 mg total) by nebulization every 6 (six) hours as needed for wheezing or shortness of breath. 03/19/15  Yes Tresa Garter, MD  beclomethasone (QVAR) 80 MCG/ACT inhaler Inhale 2 puffs into the lungs 2 (two) times daily. 03/19/15  Yes Olugbemiga Essie Christine, MD  ENSURE (ENSURE) Take 1 Can by mouth 2 (two) times daily between meals. 09/16/14  Yes Milus Banister, MD  folic acid (FOLVITE) 1 MG tablet Take 1 tablet (1 mg total) by mouth daily. 03/19/15  Yes Tresa Garter, MD  furosemide (LASIX) 40 MG tablet Take 1 tablet (40 mg total) by mouth 2 (two) times daily. 03/19/15  Yes Tresa Garter, MD  lactulose (CHRONULAC) 10 GM/15ML solution Take 30 mLs (20 g total) by mouth 2 (two) times daily. Take 20 G (30 ml) at breakfast and take 20 G (30 ml) at lunch. 03/19/15  Yes Tresa Garter, MD  pantoprazole (PROTONIX) 40 MG tablet Take 1 tablet (40 mg total) by mouth daily. 03/19/15  Yes Tresa Garter, MD  rifaximin (XIFAXAN) 550 MG TABS tablet Take 1 tablet (550 mg total) by mouth 2 (two) times daily. 08/15/14  Yes Milus Banister, MD  spironolactone (ALDACTONE) 100 MG tablet Take 1 tablet (100 mg total) by mouth 2 (two) times daily. 03/19/15  Yes Tresa Garter, MD  thiamine (VITAMIN B-1) 100 MG tablet Take 1 tablet (100 mg total) by mouth daily. 03/19/15  Yes Tresa Garter, MD  aspirin 325 MG tablet Take 325 mg  by mouth daily as needed for moderate pain.     Historical Provider, MD  oxyCODONE (OXY IR/ROXICODONE) 5 MG immediate release tablet Take 1 tablet (5 mg total) by mouth every 3 (three) hours as needed for moderate pain or severe pain. 04/10/15   Truitt Merle, MD     Vital Signs: BP 127/8 mmHg  Pulse 88  Temp(Src) 98 F (36.7 C) (Oral)  Resp 21  SpO2 99%  Physical Exam awake/alert; abd soft,NT; puncture site rt CFA soft,mildly tender, no def hematoma, old/dried blood medial to site; intact distal pulses  Imaging: No results found.  Labs:  CBC:  Recent Labs  01/14/15 1402 02/17/15 0841 04/10/15 1517 04/30/15 0735  WBC 3.7* 4.2 5.9 4.5  HGB 13.4 14.2 12.8* 12.0*  HCT 41.8 42.2 37.5* 36.6*  PLT 66* 70.0* 81* 62*    COAGS:  Recent Labs  10/16/14 0752 01/14/15 1402 02/17/15 0841 04/30/15 0735  INR 1.40* 1.40* 1.4* 1.44  APTT  --   --   --  35    BMP:  Recent Labs  10/09/14 0755 10/10/14 0501  01/14/15 1402 02/17/15 0841 04/10/15 1518 04/30/15 0735  NA 134* 136*  < > 137 135 133* 134*  K 3.9 4.6  < > 4.0 3.8 4.7 3.5  CL 104 105  --   --  106  --  103  CO2 21 24  < >  21* 24 22 24   GLUCOSE 103* 98  < > 118 86 108 105*  BUN 10 11  < > 11.5 11 11.5 14  CALCIUM 8.9 8.8  < > 8.8 9.3 8.8 8.6*  CREATININE 0.80 1.03  < > 0.8 0.83 0.8 0.98  GFRNONAA >90 82*  --   --   --   --  >60  GFRAA >90 >90  --   --   --   --  >60  < > = values in this interval not displayed.  LIVER FUNCTION TESTS:  Recent Labs  01/14/15 1402 02/17/15 0841 04/10/15 1518 04/30/15 0735  BILITOT 2.45* 2.1* 1.73* 2.9*  AST 100* 128* 124* 162*  ALT 35 48 38 54  ALKPHOS 136 144* 147 112  PROT 6.9 7.5 7.0 6.8  ALBUMIN 2.5* 3.0* 2.4* 2.5*    Assessment and Plan: S/p DEB-TACE of right hepatic lobe HCC 6/30; for overnight obs; check am labs; f/u with Dr. Anselm Pancoast in IR clinic in 2 weeks   Signed: D. Rowe Robert 04/30/2015, 3:26 PM   I spent a total of 15 minutes  in face to face in  clinical consultation/evaluation, greater than 50% of which was counseling/coordinating care for hepatic chemoembolization

## 2015-04-30 NOTE — Progress Notes (Signed)
Old blood drainage was removed with warm water near pt's groin where he had drainage from his surgical site. Dressing remains intact with only a scant amount of blood on the side where the drainage was. Will continue to monitor

## 2015-05-01 ENCOUNTER — Encounter: Payer: Self-pay | Admitting: *Deleted

## 2015-05-01 ENCOUNTER — Other Ambulatory Visit: Payer: Self-pay | Admitting: Radiology

## 2015-05-01 DIAGNOSIS — C220 Liver cell carcinoma: Secondary | ICD-10-CM

## 2015-05-01 LAB — CBC
HCT: 38 % — ABNORMAL LOW (ref 39.0–52.0)
Hemoglobin: 12.6 g/dL — ABNORMAL LOW (ref 13.0–17.0)
MCH: 31.1 pg (ref 26.0–34.0)
MCHC: 33.2 g/dL (ref 30.0–36.0)
MCV: 93.8 fL (ref 78.0–100.0)
Platelets: 65 10*3/uL — ABNORMAL LOW (ref 150–400)
RBC: 4.05 MIL/uL — AB (ref 4.22–5.81)
RDW: 17.2 % — ABNORMAL HIGH (ref 11.5–15.5)
WBC: 9.2 10*3/uL (ref 4.0–10.5)

## 2015-05-01 LAB — COMPREHENSIVE METABOLIC PANEL
ALBUMIN: 2.5 g/dL — AB (ref 3.5–5.0)
ALK PHOS: 128 U/L — AB (ref 38–126)
ALT: 69 U/L — AB (ref 17–63)
ANION GAP: 6 (ref 5–15)
AST: 250 U/L — ABNORMAL HIGH (ref 15–41)
BILIRUBIN TOTAL: 1.9 mg/dL — AB (ref 0.3–1.2)
BUN: 14 mg/dL (ref 6–20)
CO2: 23 mmol/L (ref 22–32)
CREATININE: 0.94 mg/dL (ref 0.61–1.24)
Calcium: 9 mg/dL (ref 8.9–10.3)
Chloride: 104 mmol/L (ref 101–111)
GFR calc non Af Amer: >60 mL/min (ref >60)
GLUCOSE: 127 mg/dL — AB (ref 65–99)
POTASSIUM: 4.4 mmol/L (ref 3.5–5.1)
SODIUM: 133 mmol/L — AB (ref 135–145)
TOTAL PROTEIN: 7.1 g/dL (ref 6.5–8.1)

## 2015-05-01 MED ORDER — BENZONATATE 100 MG PO CAPS
100.0000 mg | ORAL_CAPSULE | ORAL | Status: DC | PRN
  Administered 2015-05-01: 100 mg via ORAL
  Filled 2015-05-01: qty 1

## 2015-05-01 NOTE — Progress Notes (Signed)
CSW received referral that pt admitted from [facility].  CSW spoke to pt who reports that he rents an apartment within a boarding house. Pt is not currently residing in a group home. Pt reports that he has transportation from his sister upon discharge. Pt inquired about having a home health nurse at home. CSW encouraged pt to speak to MD as pt is anticipating MD to come see him prior to his discharge and discuss with MD about Home Health nurse in order for MD to place orders.  CSW notified RN and RNCM.   Inappropriate CSW referral.   CSW signing off.   Please re-consult if social work needs arise.   Alison Murray, MSW, [place] [facility] [phone]

## 2015-05-01 NOTE — Plan of Care (Signed)
Problem: Discharge Progression Outcomes Goal: Discharge plan in place and appropriate Outcome: Completed/Met Date Met:  05/01/15 Pt to see PCP within 2 weeks of discharge. He is to ask PCP about getting a home health nurse.

## 2015-05-01 NOTE — Discharge Instructions (Signed)
Chemoembolization, Care After Refer to this sheet in the next few weeks. These instructions provide you with information on caring for yourself after your procedure. Your health care provider may also give you more specific instructions. Your treatment has been planned according to current medical practices, but problems sometimes occur. Call your health care provider if you have any problems or questions after your procedure. WHAT TO EXPECT AFTER THE PROCEDURE  After your procedure, it is typical to have the following:  You might have a slight fever for 1-2 weeks after the procedure. If it gets worse, let your health care provider know.  You might feel tired and not hungry. This is normal. These feelings should go away in about 1 week. HOME CARE INSTRUCTIONS  Take any medicine your health care provider prescribed for pain, nausea, or fever. Follow the directions carefully.  Ask your health care provider whether you can take over-the-counter medicines for pain or fever. Do not take aspirin or anti-inflammatory medicines such as ibuprofen or naproxen unless your health care provider says that you should. These medicines can increase the chances of bleeding.  If you were given a small breathing device (incentive spirometer), be sure to use it. It helps keep your lungs clear while you are recovering. You will not need this after your activity level is back to normal.  Do not get the puncture site wet for the first few days after surgery or until your health care provider says it is okay.  You should be able to resume your normal routine in about 1 week.  During the first month after your procedure, you will probably need to go back to your health care provider for some simple tests. Scans and blood tests will help determine whether the procedure worked. SEEK MEDICAL CARE IF:  Blood or fluid leaks from the wound, or the wound becomes red or swollen.  You become nauseous or throw up for more than  2 days after surgery.  Your pain or fever becomes worse than it was when you left the hospital.  You cannot drink clear liquids such as water or diluted juice or tea 24 hours after your procedure.  You develop a rash. SEEK IMMEDIATE MEDICAL CARE IF:  You have a fever that gets worse or does not go away after 1 week.  You develop pain, swelling, or discoloration in your legs.  Your legs become pale, cold, or blue.  You develop shortness of breath, feel faint, or pass out.  You have chest pain.  You have weakness or difficulty moving your arms or legs.  You have changes in your speech or vision. Document Released: 06/15/2011 Document Revised: 08/07/2013 Document Reviewed: 06/24/2013 [facility] Patient Information 2015 [place], [place]. This information is not intended to replace advice given to you by your health care provider. Make sure you discuss any questions you have with your health care provider.

## 2015-05-01 NOTE — Discharge Summary (Signed)
Patient ID: STYLES FAMBRO MRN: [id number] DOB/AGE: September 25, 1961 54 y.o.  Admit date: 04/30/2015 Discharge date: 05/01/2015  Admission Diagnoses: Hepatocellular Carcinoma  Discharge Diagnoses:  Active Problems:   [place] (hepatocellular carcinoma)   Discharged Condition: good  Hospital Course: Mr. Yordy is a 55 year old male with history of hepatitis C, cirrhosis, hepatocellular carcinoma, status post TIPS February 2015 and prior hepatic DEB TACE in December 2015.  His most recent MRI of the abdomen in May 2016 reveals a dominant mass in the posterior right hepatic lobe which has increased in size as well as multiple additional smaller nodules in the right hepatic lobe which are indeterminate but suspicious for hepatocellular carcinomas.  Patient's AFP has also increased since November 2015 and currently is at 20,284.  He presented yesterday following recent IR follow-up for additional chemoembolization (DEB-TACE) of the dominant segment 7 liver lesion.  This was done yesterday by Dr. Anselm Pancoast.  There were no complications with the procedure.  He did very well overnight.  He has no new complaints. Only moderate discomfort which is controlled well with Oxy IR 5 mg q 4 prn.  Consults: None  Significant Diagnostic Studies: labs:  Results for orders placed or performed during the hospital encounter of 04/30/15  Comprehensive metabolic panel  Result Value Ref Range   Sodium 134 (L) 135 - 145 mmol/L   Potassium 3.5 3.5 - 5.1 mmol/L   Chloride 103 101 - 111 mmol/L   CO2 24 22 - 32 mmol/L   Glucose, Bld 105 (H) 65 - 99 mg/dL   BUN 14 6 - 20 mg/dL   Creatinine, Ser 0.98 0.61 - 1.24 mg/dL   Calcium 8.6 (L) 8.9 - 10.3 mg/dL   Total Protein 6.8 6.5 - 8.1 g/dL   Albumin 2.5 (L) 3.5 - 5.0 g/dL   AST 162 (H) 15 - 41 U/L   ALT 54 17 - 63 U/L   Alkaline Phosphatase 112 38 - 126 U/L   Total Bilirubin 2.9 (H) 0.3 - 1.2 mg/dL   GFR calc non Af Amer >60 >60 mL/min   GFR calc Af Amer >60 >60  mL/min   Anion gap 7 5 - 15  APTT upon arrival  Result Value Ref Range   aPTT 35 24 - 37 seconds  CBC upon arrival  Result Value Ref Range   WBC 4.5 4.0 - 10.5 K/uL   RBC 3.91 (L) 4.22 - 5.81 MIL/uL   Hemoglobin 12.0 (L) 13.0 - 17.0 g/dL   HCT 36.6 (L) 39.0 - 52.0 %   MCV 93.6 78.0 - 100.0 fL   MCH 30.7 26.0 - 34.0 pg   MCHC 32.8 30.0 - 36.0 g/dL   RDW 17.7 (H) 11.5 - 15.5 %   Platelets 62 (L) 150 - 400 K/uL  Protime-INR upon arrival  Result Value Ref Range   Prothrombin Time 17.6 (H) 11.6 - 15.2 seconds   INR 1.44 0.00 - 1.49  Comprehensive metabolic panel  Result Value Ref Range   Sodium 133 (L) 135 - 145 mmol/L   Potassium 4.4 3.5 - 5.1 mmol/L   Chloride 104 101 - 111 mmol/L   CO2 23 22 - 32 mmol/L   Glucose, Bld 127 (H) 65 - 99 mg/dL   BUN 14 6 - 20 mg/dL   Creatinine, Ser 0.94 0.61 - 1.24 mg/dL   Calcium 9.0 8.9 - 10.3 mg/dL   Total Protein 7.1 6.5 - 8.1 g/dL   Albumin 2.5 (L) 3.5 - 5.0 g/dL  AST 250 (H) 15 - 41 U/L   ALT 69 (H) 17 - 63 U/L   Alkaline Phosphatase 128 (H) 38 - 126 U/L   Total Bilirubin 1.9 (H) 0.3 - 1.2 mg/dL   GFR calc non Af Amer >60 >60 mL/min   GFR calc Af Amer >60 >60 mL/min   Anion gap 6 5 - 15  CBC  Result Value Ref Range   WBC 9.2 4.0 - 10.5 K/uL   RBC 4.05 (L) 4.22 - 5.81 MIL/uL   Hemoglobin 12.6 (L) 13.0 - 17.0 g/dL   HCT 38.0 (L) 39.0 - 52.0 %   MCV 93.8 78.0 - 100.0 fL   MCH 31.1 26.0 - 34.0 pg   MCHC 33.2 30.0 - 36.0 g/dL   RDW 17.2 (H) 11.5 - 15.5 %   Platelets 65 (L) 150 - 400 K/uL    Treatments: procedures:  IR EMBO TUMOR ORGAN ISCHEMIA INFARCT INC GUIDE ROADMAPPING   Discharge Exam: Blood pressure 116/67, pulse 65, temperature 97.7 F (36.5 C), temperature source Oral, resp. rate 20, height 5\' 8"  (1.727 m), weight 228 lb 6.4 oz (103.602 kg), SpO2 99 %.  Physical Exam:  Awake and Alert, NAD  Heart RRR, no murmurs  Lungs Clear  Abdomen Soft, NTND  Right groin stick ok, no hematoma or  pseudoaneurysm  Disposition: 01-Home or Self Care  Discharge Instructions    Call MD for:  difficulty breathing, headache or visual disturbances    Complete by:  As directed      Call MD for:  extreme fatigue    Complete by:  As directed      Call MD for:  hives    Complete by:  As directed      Call MD for:  persistant dizziness or light-headedness    Complete by:  As directed      Call MD for:  persistant nausea and vomiting    Complete by:  As directed      Call MD for:  redness, tenderness, or signs of infection (pain, swelling, redness, odor or green/yellow discharge around incision site)    Complete by:  As directed      Call MD for:  severe uncontrolled pain    Complete by:  As directed      Call MD for:  temperature >100.4    Complete by:  As directed      Diet - low sodium heart healthy    Complete by:  As directed      Driving Restrictions    Complete by:  As directed   No driving for 24 hours     Increase activity slowly    Complete by:  As directed      Lifting restrictions    Complete by:  As directed   No heavy lifting for 48 hours     May shower / Bathe    Complete by:  As directed      May walk up steps    Complete by:  As directed      Remove dressing in 24 hours    Complete by:  As directed   Can apply a bandaid if needed            Medication List    TAKE these medications        albuterol (2.5 MG/3ML) 0.083% nebulizer solution  Commonly known as:  PROVENTIL  Take 3 mLs (2.5 mg total) by nebulization every 6 (six) hours as needed for  wheezing or shortness of breath.     albuterol 108 (90 BASE) MCG/ACT inhaler  Commonly known as:  PROVENTIL HFA;VENTOLIN HFA  Inhale 2 puffs into the lungs every 6 (six) hours as needed for wheezing or shortness of breath.     aspirin 325 MG tablet  Take 325 mg by mouth daily as needed for moderate pain.     beclomethasone 80 MCG/ACT inhaler  Commonly known as:  QVAR  Inhale 2 puffs into the lungs 2 (two)  times daily.     ENSURE  Take 1 Can by mouth 2 (two) times daily between meals.     folic acid 1 MG tablet  Commonly known as:  FOLVITE  Take 1 tablet (1 mg total) by mouth daily.     furosemide 40 MG tablet  Commonly known as:  LASIX  Take 1 tablet (40 mg total) by mouth 2 (two) times daily.     lactulose 10 GM/15ML solution  Commonly known as:  CHRONULAC  Take 30 mLs (20 g total) by mouth 2 (two) times daily. Take 20 G (30 ml) at breakfast and take 20 G (30 ml) at lunch.     oxyCODONE 5 MG immediate release tablet  Commonly known as:  Oxy IR/ROXICODONE  Take 1 tablet (5 mg total) by mouth every 3 (three) hours as needed for moderate pain or severe pain.     pantoprazole 40 MG tablet  Commonly known as:  PROTONIX  Take 1 tablet (40 mg total) by mouth daily.     rifaximin 550 MG Tabs tablet  Commonly known as:  XIFAXAN  Take 1 tablet (550 mg total) by mouth 2 (two) times daily.     spironolactone 100 MG tablet  Commonly known as:  ALDACTONE  Take 1 tablet (100 mg total) by mouth 2 (two) times daily.     thiamine 100 MG tablet  Commonly known as:  VITAMIN B-1  Take 1 tablet (100 mg total) by mouth daily.      CIPRO 500 mg by mouth twice a day x 5 days  Commonly known as Ciprofloxin     Follow-up Information    Follow up with HENN, ADAM Thurmond Butts, MD In 2 weeks.   Specialty:  Interventional Radiology   Why:  Radiology will call with follow up appointment with Dr. Anselm Pancoast in 2 weeks (call [phone] or [phone] with any questions)   Contact information:   [address] [place] [address] [phone]        Signed: Murrell Redden PA-C 05/01/2015, 8:36 AM   I have spent Less Than 30 Minutes discharging Ernesta Amble.

## 2015-05-01 NOTE — Progress Notes (Signed)
Discharge instructions explained to pt and script given to pt. His sister came in to give him a ride to his older sister's home, where he will be staying.

## 2015-05-05 ENCOUNTER — Other Ambulatory Visit: Payer: Self-pay | Admitting: *Deleted

## 2015-05-05 DIAGNOSIS — C220 Liver cell carcinoma: Secondary | ICD-10-CM

## 2015-05-07 ENCOUNTER — Ambulatory Visit: Payer: Medicaid Other | Admitting: Internal Medicine

## 2015-05-12 ENCOUNTER — Other Ambulatory Visit: Payer: Medicaid Other

## 2015-05-12 ENCOUNTER — Telehealth: Payer: Self-pay | Admitting: *Deleted

## 2015-05-12 ENCOUNTER — Ambulatory Visit: Payer: Medicaid Other | Admitting: Hematology

## 2015-05-12 NOTE — Telephone Encounter (Signed)
  Oncology Nurse Navigator Documentation    Navigator Encounter Type: Telephone (05/12/15 1847)   Left VM for his sister, [name] to call back and give update on how Oscar Swanson is doing s/p Y-90 treatment. Also requested she reschedule the appointment with Dr. Burr Medico that was missed today.

## 2015-05-20 ENCOUNTER — Telehealth: Payer: Self-pay | Admitting: *Deleted

## 2015-05-20 NOTE — Telephone Encounter (Signed)
Oncology Nurse Navigator Documentation  Oncology Nurse Navigator Flowsheets 05/20/2015  Navigator Encounter Type Telephone-3 month   Treatment Phase Treatment-F/U Y-90  Barriers/Navigation Needs No barriers at this time  Interventions Coordination of Care  Coordination of Care MD Appointments-needing lab 7/22 at 2 pm if possible  Spoke w/sister Oscar Swanson, who reports Oscar Swanson is doing well s/p his Y-90 tx. Was down a few days afterwards.  Forgot about his appointment on 7/12 with Dr. Burr Medico. Returns 8/2 to [facility] for follow up. Says she was told by IR to have labs done at [facility] and forwarded to him on 05/22/15. Currently has no lab appointment-will clarify with MD that labs are appropriate for Friday and when he needs to see medical oncology again (next appt. 07/22/15) She is asking for labs on 7/22 at 2 pm (was not aware she needed to make appointment).

## 2015-05-21 ENCOUNTER — Telehealth: Payer: Self-pay | Admitting: Hematology

## 2015-05-21 ENCOUNTER — Telehealth: Payer: Self-pay | Admitting: *Deleted

## 2015-05-21 ENCOUNTER — Other Ambulatory Visit: Payer: Self-pay | Admitting: *Deleted

## 2015-05-21 NOTE — Telephone Encounter (Signed)
S/w pt's sister confirmed labs for 07/22 per 07/21 POF.... Cherylann Banas

## 2015-05-21 NOTE — Telephone Encounter (Signed)
Called pt's sister, [name] regarding missed appt with Dr Burr Medico after she had talked with Merceda Elks RN Navigator.  Informed OK to have 05/22/15 labs & to keep 07/22/15 MD appt.  POF to schedulers for lab 7/22.

## 2015-05-22 ENCOUNTER — Other Ambulatory Visit (HOSPITAL_BASED_OUTPATIENT_CLINIC_OR_DEPARTMENT_OTHER): Payer: Medicaid Other

## 2015-05-22 DIAGNOSIS — C220 Liver cell carcinoma: Secondary | ICD-10-CM

## 2015-05-22 LAB — COMPREHENSIVE METABOLIC PANEL (CC13)
ALK PHOS: 184 U/L — AB (ref 40–150)
ALT: 68 U/L — AB (ref 0–55)
AST: 213 U/L (ref 5–34)
Albumin: 2.3 g/dL — ABNORMAL LOW (ref 3.5–5.0)
Anion Gap: 5 mEq/L (ref 3–11)
BUN: 14.9 mg/dL (ref 7.0–26.0)
CALCIUM: 8.5 mg/dL (ref 8.4–10.4)
CHLORIDE: 109 meq/L (ref 98–109)
CO2: 20 meq/L — AB (ref 22–29)
Creatinine: 0.8 mg/dL (ref 0.7–1.3)
EGFR: >90 mL/min/{1.73_m2} (ref >90)
Glucose: 142 mg/dl — ABNORMAL HIGH (ref 70–140)
Potassium: 3.9 mEq/L (ref 3.5–5.1)
Sodium: 134 mEq/L — ABNORMAL LOW (ref 136–145)
TOTAL PROTEIN: 6.8 g/dL (ref 6.4–8.3)
Total Bilirubin: 2.65 mg/dL — ABNORMAL HIGH (ref 0.20–1.20)

## 2015-05-22 LAB — CBC & DIFF AND RETIC
BASO%: 0.2 % (ref 0.0–2.0)
Basophils Absolute: 0 10*3/uL (ref 0.0–0.1)
EOS%: 0.6 % (ref 0.0–7.0)
Eosinophils Absolute: 0 10*3/uL (ref 0.0–0.5)
HCT: 36.6 % — ABNORMAL LOW (ref 38.4–49.9)
HGB: 12.5 g/dL — ABNORMAL LOW (ref 13.0–17.1)
IMMATURE RETIC FRACT: 6.1 % (ref 3.00–10.60)
LYMPH%: 11.1 % — ABNORMAL LOW (ref 14.0–49.0)
MCH: 32.2 pg (ref 27.2–33.4)
MCHC: 34.2 g/dL (ref 32.0–36.0)
MCV: 94.3 fL (ref 79.3–98.0)
MONO#: 1 10*3/uL — AB (ref 0.1–0.9)
MONO%: 16.2 % — AB (ref 0.0–14.0)
NEUT#: 4.5 10*3/uL (ref 1.5–6.5)
NEUT%: 71.9 % (ref 39.0–75.0)
Platelets: 73 10*3/uL — ABNORMAL LOW (ref 140–400)
RBC: 3.88 10*6/uL — ABNORMAL LOW (ref 4.20–5.82)
RDW: 20.4 % — AB (ref 11.0–14.6)
Retic %: 2.94 % — ABNORMAL HIGH (ref 0.80–1.80)
Retic Ct Abs: 114.07 10*3/uL — ABNORMAL HIGH (ref 34.80–93.90)
WBC: 6.3 10*3/uL (ref 4.0–10.3)
lymph#: 0.7 10*3/uL — ABNORMAL LOW (ref 0.9–3.3)

## 2015-05-25 LAB — AFP TUMOR MARKER: AFP-Tumor Marker: 38946 ng/mL — ABNORMAL HIGH (ref <6.1)

## 2015-06-02 ENCOUNTER — Ambulatory Visit
Admit: 2015-06-02 | Discharge: 2015-06-02 | Disposition: A | Discharge status: Home / Self Care | Payer: Medicaid Other | Attending: Diagnostic Radiology | Admitting: Diagnostic Radiology

## 2015-06-02 ENCOUNTER — Other Ambulatory Visit: Payer: Self-pay | Admitting: *Deleted

## 2015-06-02 DIAGNOSIS — C220 Liver cell carcinoma: Secondary | ICD-10-CM

## 2015-06-02 NOTE — Progress Notes (Signed)
Chief Complaint: Patient was seen in consultation today for  Chief Complaint  Patient presents with  . Follow-up    1 mo follow up DEB-TACE     at the request of Henn,Adam  Referring Physician(s): Henn,Adam  History of Present Illness: Oscar Swanson is a 54 y.o. male hepatitis C, cirrhosis, status post TIPS procedure and hepatocellular carcinoma. The patient underwent DEB-TACE on 10/10/2014. Patient had follow-up imaging on 03/17/2015 which demonstrated marked enlargement of the dominant lesion with additional suspicious lesions which could either be dysplastic nodules or small hepatocellular carcinomas. In addition, the patient's alpha-fetoprotein level prior to treatment was 3179 and most recent level is 20,284.   He underwent repeat DEB-TACE on 04/28/15.  He has done well in the time of the embolization which he attributes to taking calcium and zinc supplements at the recommendation of his oncologist, Dr. Burr Medico.  He reports that he currently feels stronger and better than he has in some time. We again discussed his drinking. He asserts that he is almost ready to quit. He has drank just a small amount of alcohol over the past 2 weeks.  His main complaint today is diarrhea. Several people in the home he is currently living in also have a diarrhea. He is holding his lactulose until the loose stools resolve.   Similar to before, he has intermittent abdominal pain. No new nausea, vomiting, pruritus or other clinical issues.  Past Medical History  Diagnosis Date  . Diabetes mellitus without complication   . Schizophrenia     onset age 62.  in and out of psych facilities from age 63 to age 52.  s/p electroconvulsive therapy.  . TB (tuberculosis)     sister says this dx'd at age 50 and treated  . Hepatic encephalopathy   . Esophageal varices   . GI bleed   . Cirrhosis   . Thrombocytopenia   . Alcohol abuse   . Mild Coagulopathy 11/08/2013  . Hepatitis C 12/02/2013  . Smoking  09/23/2013    Past Surgical History  Procedure Laterality Date  . Cataract surgery  Bilateral   . Esophagogastroduodenoscopy N/A 11/08/2013    Procedure: ESOPHAGOGASTRODUODENOSCOPY (EGD);  Surgeon: Milus Banister, MD;  Location: [facility];  Service: Endoscopy;  Laterality: N/A;  bedside  . Radiology with anesthesia N/A 12/26/2013    Procedure: Transjugular Intrahepatic Portosystemic Shunt (TIPS);  Surgeon: Carylon Perches, MD;  Location: [facility];  Service: Radiology;  Laterality: N/A;    Allergies: Haldol  Medications: Prior to Admission medications   Medication Sig Start Date End Date Taking? Authorizing Provider  albuterol (PROVENTIL HFA;VENTOLIN HFA) 108 (90 BASE) MCG/ACT inhaler Inhale 2 puffs into the lungs every 6 (six) hours as needed for wheezing or shortness of breath. 03/19/15  Yes Tresa Garter, MD  albuterol (PROVENTIL) (2.5 MG/3ML) 0.083% nebulizer solution Take 3 mLs (2.5 mg total) by nebulization every 6 (six) hours as needed for wheezing or shortness of breath. 03/19/15  Yes Tresa Garter, MD  beclomethasone (QVAR) 80 MCG/ACT inhaler Inhale 2 puffs into the lungs 2 (two) times daily. 03/19/15  Yes Tresa Garter, MD  furosemide (LASIX) 40 MG tablet Take 1 tablet (40 mg total) by mouth 2 (two) times daily. 03/19/15  Yes Tresa Garter, MD  lactulose (CHRONULAC) 10 GM/15ML solution Take 30 mLs (20 g total) by mouth 2 (two) times daily. Take 20 G (30 ml) at breakfast and take 20 G (30 ml) at lunch. 03/19/15  Yes Tresa Garter, MD  oxyCODONE (OXY IR/ROXICODONE) 5 MG immediate release tablet Take 1 tablet (5 mg total) by mouth every 3 (three) hours as needed for moderate pain or severe pain. 04/10/15  Yes Truitt Merle, MD  pantoprazole (PROTONIX) 40 MG tablet Take 1 tablet (40 mg total) by mouth daily. 03/19/15  Yes Tresa Garter, MD  spironolactone (ALDACTONE) 100 MG tablet Take 1 tablet (100 mg total) by mouth 2 (two) times daily. 03/19/15  Yes Tresa Garter, MD  thiamine (VITAMIN B-1) 100 MG tablet Take 1 tablet (100 mg total) by mouth daily. 03/19/15  Yes Tresa Garter, MD  aspirin 325 MG tablet Take 325 mg by mouth daily as needed for moderate pain.     Historical Provider, MD  ENSURE (ENSURE) Take 1 Can by mouth 2 (two) times daily between meals. Patient not taking: Reported on 06/02/2015 09/16/14   Milus Banister, MD  folic acid (FOLVITE) 1 MG tablet Take 1 tablet (1 mg total) by mouth daily. 03/19/15   Tresa Garter, MD  rifaximin (XIFAXAN) 550 MG TABS tablet Take 1 tablet (550 mg total) by mouth 2 (two) times daily. Patient not taking: Reported on 06/02/2015 08/15/14   Milus Banister, MD     Family History  Problem Relation Age of Onset  . Hypertension Mother   . Colon cancer Neg Hx   . Lung cancer Mother     smoker  . Colon polyps Sister     twin sister    History   Social History  . Marital Status: Single    Spouse Name: N/A  . Number of Children: 0  . Years of Education: N/A   Social History Main Topics  . Smoking status: Current Some Day Smoker -- 0.50 packs/day for 25 years    Types: Cigarettes  . Smokeless tobacco: Never Used     Comment: Tobacco information given 02/10/14  . Alcohol Use: 0.0 oz/week    0 Standard drinks or equivalent per week     Comment:  drink beer socially/per patient 1 beer qod    . Drug Use: No     Comment: not currently  . Sexual Activity: Not Currently   Other Topics Concern  . 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  All other systems are negative.  Review of Systems  Vital Signs: BP 131/68 mmHg  Pulse 71  Temp(Src) 98 F (36.7 C) (Oral)  Resp 15  Ht 5' 8.5" (1.74 m)  Wt 226 lb (102.513 kg)  BMI 33.86 kg/m2  SpO2  100%  Physical Exam  Constitutional: He is oriented to person, place, and time. He appears well-developed and well-nourished. No distress.  HENT:  Head: Normocephalic and atraumatic.  Eyes: No scleral icterus.  Cardiovascular: Normal rate.   Pulmonary/Chest: Effort normal.  Abdominal: Soft. He exhibits no distension. There is no tenderness.  Neurological: He is alert and oriented to person, place, and time.  Skin: Skin is warm and dry.  Psychiatric: He has a normal mood and affect. His behavior is normal.  Nursing note and vitals reviewed.   Imaging: No results found.  Labs:  CBC:  Recent Labs  04/10/15 1517 04/30/15 0735 05/01/15 0420 05/22/15 1157  WBC 5.9 4.5 9.2 6.3  HGB 12.8* 12.0* 12.6* 12.5*  HCT 37.5* 36.6* 38.0* 36.6*  PLT 81* 62* 65* 73*    COAGS:  Recent Labs  10/16/14 0752 01/14/15 1402 02/17/15 0841 04/30/15 0735  INR 1.40* 1.40* 1.4* 1.44  APTT  --   --   --  35    BMP:  Recent Labs  10/09/14 0755 10/10/14 0501  02/17/15 0841 04/10/15 1518 04/30/15 0735 05/01/15 0420 05/22/15 1157  NA 134* 136*  < > 135 133* 134* 133* 134*  K 3.9 4.6  < > 3.8 4.7 3.5 4.4 3.9  CL 104 105  --  106  --  103 104  --   CO2 21 24  < > 24 22 24 23  20*  GLUCOSE 103* 98  < > 86 108 105* 127* 142*  BUN 10 11  < > 11 11.5 14 14  14.9  CALCIUM 8.9 8.8  < > 9.3 8.8 8.6* 9.0 8.5  CREATININE 0.80 1.03  < > 0.83 0.8 0.98 0.94 0.8  GFRNONAA >90 82*  --   --   --  >60 >60  --   GFRAA >90 >90  --   --   --  >60 >60  --   < > = values in this interval not displayed.  LIVER FUNCTION TESTS:  Recent Labs  04/10/15 1518 04/30/15 0735 05/01/15 0420 05/22/15 1157  BILITOT 1.73* 2.9* 1.9* 2.65*  AST 124* 162* 250* 213*  ALT 38 54 69* 68*  ALKPHOS 147 112 128* 184*  PROT 7.0 6.8 7.1 6.8  ALBUMIN 2.4* 2.5* 2.5* 2.3*    TUMOR MARKERS:  Recent Labs  09/17/14 1238 01/14/15 1402 04/10/15 1517 05/22/15 1157  AFPTM 2892.5*  3179.3* 21290.0* 20284.0* 38946.0*     Assessment and Plan:  Although Oscar Swanson continues to feel better following his most recent embolization with drug-eluting beads, his AFP continues to elevate now at 38,946 from 20,284 on 04/10/2015.  Additionally, his bilirubin continues to fluctuate and is 2.65 today. This is very likely due to his continued alcohol use.  Based on his AFP and the results of his prior MRI, I am concerned that he is beginning to develop more extensive progression of disease. It has been nearly 3 months since his last abdominal MRI, I will schedule an MRI to be done this week or next. I am concerned that he may now have multifocal [place].  Unfortunately, if this is the case we are fairly limited in what we can offer him for additional liver directed therapy unless he can truly abstain from alcohol and his hepatic reserve improves. We may be nearing the point where palliative Nexavar is his only treatment option.   We will contact Dr. Burr Medico after we get the results from his follow-up MRI and a definite answer on whether or not he is showing disease progression.  1.) MRI with gadolinium contrast 2.) Repeat labs (CBC, INR, CMP)   Signed: Amelia, [place]elle 06/02/2015, 9:58 AM   I spent a total of  15 Minutes in face to face in clinical consultation, greater than 50% of which was counseling/coordinating care for Oscar Swanson.

## 2015-06-15 ENCOUNTER — Ambulatory Visit (HOSPITAL_COMMUNITY): Payer: Medicaid Other

## 2015-06-16 ENCOUNTER — Encounter: Payer: Self-pay | Admitting: Pharmacist

## 2015-06-18 ENCOUNTER — Telehealth: Payer: Self-pay | Admitting: *Deleted

## 2015-06-18 MED ORDER — OXYCODONE HCL 5 MG PO TABS: 5.0000 mg | ORAL_TABLET | ORAL | Status: AC | PRN

## 2015-06-18 NOTE — Telephone Encounter (Signed)
Received phone call from pt requesting refill on his pain med.  OK by Dr Burr Medico.  Will notify pt to p/u script.

## 2015-06-19 ENCOUNTER — Telehealth: Payer: Self-pay | Admitting: Internal Medicine

## 2015-06-19 NOTE — Telephone Encounter (Signed)
Patient called needs a follow up appointment to have mobility exam renewed, patient will call us back in Sept to schedule appointment.

## 2015-06-23 ENCOUNTER — Ambulatory Visit (HOSPITAL_COMMUNITY)
Admission: RE | Admit: 2015-06-23 | Discharge: 2015-06-23 | Disposition: A | Discharge status: Home / Self Care | Payer: Medicaid Other | Source: Ambulatory Visit | Attending: Interventional Radiology | Admitting: Interventional Radiology

## 2015-06-23 DIAGNOSIS — C220 Liver cell carcinoma: Secondary | ICD-10-CM

## 2015-06-23 MED ORDER — GADOXETATE DISODIUM 0.25 MMOL/ML IV SOLN
5.0000 mL | Freq: Once | INTRAVENOUS | Status: AC | PRN
  Administered 2015-06-23: 10 mL via INTRAVENOUS

## 2015-06-24 ENCOUNTER — Emergency Department (HOSPITAL_COMMUNITY): Payer: Medicaid Other

## 2015-06-24 ENCOUNTER — Inpatient Hospital Stay (HOSPITAL_COMMUNITY)
Admission: EM | Admit: 2015-06-24 | Discharge: 2015-07-02 | DRG: 871 | Disposition: E | Payer: Medicaid Other | Attending: Internal Medicine | Admitting: Internal Medicine

## 2015-06-24 ENCOUNTER — Encounter (HOSPITAL_COMMUNITY): Payer: Self-pay | Admitting: Family Medicine

## 2015-06-24 ENCOUNTER — Inpatient Hospital Stay (HOSPITAL_COMMUNITY): Payer: Medicaid Other

## 2015-06-24 DIAGNOSIS — R1011 Right upper quadrant pain: Secondary | ICD-10-CM

## 2015-06-24 DIAGNOSIS — A419 Sepsis, unspecified organism: Secondary | ICD-10-CM | POA: Diagnosis present

## 2015-06-24 DIAGNOSIS — E119 Type 2 diabetes mellitus without complications: Secondary | ICD-10-CM | POA: Diagnosis present

## 2015-06-24 DIAGNOSIS — K8000 Calculus of gallbladder with acute cholecystitis without obstruction: Secondary | ICD-10-CM | POA: Diagnosis present

## 2015-06-24 DIAGNOSIS — B1920 Unspecified viral hepatitis C without hepatic coma: Secondary | ICD-10-CM | POA: Diagnosis present

## 2015-06-24 DIAGNOSIS — D689 Coagulation defect, unspecified: Secondary | ICD-10-CM | POA: Diagnosis present

## 2015-06-24 DIAGNOSIS — Z7982 Long term (current) use of aspirin: Secondary | ICD-10-CM | POA: Diagnosis not present

## 2015-06-24 DIAGNOSIS — Z66 Do not resuscitate: Secondary | ICD-10-CM | POA: Diagnosis present

## 2015-06-24 DIAGNOSIS — R791 Abnormal coagulation profile: Secondary | ICD-10-CM | POA: Diagnosis present

## 2015-06-24 DIAGNOSIS — D649 Anemia, unspecified: Secondary | ICD-10-CM

## 2015-06-24 DIAGNOSIS — N179 Acute kidney failure, unspecified: Secondary | ICD-10-CM | POA: Diagnosis present

## 2015-06-24 DIAGNOSIS — D6959 Other secondary thrombocytopenia: Secondary | ICD-10-CM | POA: Diagnosis present

## 2015-06-24 DIAGNOSIS — E875 Hyperkalemia: Secondary | ICD-10-CM | POA: Diagnosis present

## 2015-06-24 DIAGNOSIS — R197 Diarrhea, unspecified: Secondary | ICD-10-CM

## 2015-06-24 DIAGNOSIS — F209 Schizophrenia, unspecified: Secondary | ICD-10-CM | POA: Diagnosis present

## 2015-06-24 DIAGNOSIS — E871 Hypo-osmolality and hyponatremia: Secondary | ICD-10-CM | POA: Diagnosis present

## 2015-06-24 DIAGNOSIS — R339 Retention of urine, unspecified: Secondary | ICD-10-CM | POA: Diagnosis present

## 2015-06-24 DIAGNOSIS — Z6832 Body mass index (BMI) 32.0-32.9, adult: Secondary | ICD-10-CM | POA: Diagnosis not present

## 2015-06-24 DIAGNOSIS — J449 Chronic obstructive pulmonary disease, unspecified: Secondary | ICD-10-CM | POA: Diagnosis present

## 2015-06-24 DIAGNOSIS — E43 Unspecified severe protein-calorie malnutrition: Secondary | ICD-10-CM | POA: Diagnosis present

## 2015-06-24 DIAGNOSIS — D696 Thrombocytopenia, unspecified: Secondary | ICD-10-CM | POA: Diagnosis present

## 2015-06-24 DIAGNOSIS — Z79899 Other long term (current) drug therapy: Secondary | ICD-10-CM | POA: Diagnosis not present

## 2015-06-24 DIAGNOSIS — K7031 Alcoholic cirrhosis of liver with ascites: Secondary | ICD-10-CM | POA: Diagnosis present

## 2015-06-24 DIAGNOSIS — I8500 Esophageal varices without bleeding: Secondary | ICD-10-CM | POA: Diagnosis present

## 2015-06-24 DIAGNOSIS — R7401 Elevation of levels of liver transaminase levels: Secondary | ICD-10-CM

## 2015-06-24 DIAGNOSIS — E669 Obesity, unspecified: Secondary | ICD-10-CM | POA: Diagnosis present

## 2015-06-24 DIAGNOSIS — E872 Acidosis: Secondary | ICD-10-CM | POA: Diagnosis present

## 2015-06-24 DIAGNOSIS — C220 Liver cell carcinoma: Secondary | ICD-10-CM | POA: Diagnosis present

## 2015-06-24 DIAGNOSIS — Z515 Encounter for palliative care: Secondary | ICD-10-CM

## 2015-06-24 DIAGNOSIS — R748 Abnormal levels of other serum enzymes: Secondary | ICD-10-CM

## 2015-06-24 DIAGNOSIS — D72829 Elevated white blood cell count, unspecified: Secondary | ICD-10-CM

## 2015-06-24 DIAGNOSIS — K767 Hepatorenal syndrome: Secondary | ICD-10-CM | POA: Diagnosis present

## 2015-06-24 DIAGNOSIS — Z6834 Body mass index (BMI) 34.0-34.9, adult: Secondary | ICD-10-CM

## 2015-06-24 DIAGNOSIS — F17210 Nicotine dependence, cigarettes, uncomplicated: Secondary | ICD-10-CM | POA: Diagnosis present

## 2015-06-24 DIAGNOSIS — K7290 Hepatic failure, unspecified without coma: Secondary | ICD-10-CM | POA: Diagnosis present

## 2015-06-24 DIAGNOSIS — D630 Anemia in neoplastic disease: Secondary | ICD-10-CM | POA: Diagnosis present

## 2015-06-24 DIAGNOSIS — Z8249 Family history of ischemic heart disease and other diseases of the circulatory system: Secondary | ICD-10-CM

## 2015-06-24 DIAGNOSIS — R740 Nonspecific elevation of levels of transaminase and lactic acid dehydrogenase [LDH]: Secondary | ICD-10-CM

## 2015-06-24 DIAGNOSIS — Z801 Family history of malignant neoplasm of trachea, bronchus and lung: Secondary | ICD-10-CM | POA: Diagnosis not present

## 2015-06-24 DIAGNOSIS — K810 Acute cholecystitis: Secondary | ICD-10-CM | POA: Diagnosis not present

## 2015-06-24 DIAGNOSIS — F419 Anxiety disorder, unspecified: Secondary | ICD-10-CM | POA: Diagnosis present

## 2015-06-24 DIAGNOSIS — D638 Anemia in other chronic diseases classified elsewhere: Secondary | ICD-10-CM | POA: Diagnosis present

## 2015-06-24 DIAGNOSIS — R7989 Other specified abnormal findings of blood chemistry: Secondary | ICD-10-CM

## 2015-06-24 DIAGNOSIS — E860 Dehydration: Secondary | ICD-10-CM | POA: Diagnosis present

## 2015-06-24 DIAGNOSIS — R112 Nausea with vomiting, unspecified: Secondary | ICD-10-CM

## 2015-06-24 HISTORY — DX: Malignant (primary) neoplasm, unspecified: C80.1

## 2015-06-24 LAB — CBC WITH DIFFERENTIAL/PLATELET
BASOS ABS: 0 10*3/uL (ref 0.0–0.1)
BASOS PCT: 0 % (ref 0–1)
EOS PCT: 0 % (ref 0–5)
Eosinophils Absolute: 0.1 10*3/uL (ref 0.0–0.7)
HCT: 30.4 % — ABNORMAL LOW (ref 39.0–52.0)
Hemoglobin: 10.4 g/dL — ABNORMAL LOW (ref 13.0–17.0)
LYMPHS PCT: 6 % — AB (ref 12–46)
Lymphs Abs: 1 10*3/uL (ref 0.7–4.0)
MCH: 31.9 pg (ref 26.0–34.0)
MCHC: 34.2 g/dL (ref 30.0–36.0)
MCV: 93.3 fL (ref 78.0–100.0)
MONO ABS: 2.1 10*3/uL — AB (ref 0.1–1.0)
Monocytes Relative: 12 % (ref 3–12)
Neutro Abs: 14 10*3/uL — ABNORMAL HIGH (ref 1.7–7.7)
Neutrophils Relative %: 82 % — ABNORMAL HIGH (ref 43–77)
PLATELETS: 144 10*3/uL — AB (ref 150–400)
RBC: 3.26 MIL/uL — AB (ref 4.22–5.81)
RDW: 20.8 % — AB (ref 11.5–15.5)
WBC: 17.1 10*3/uL — AB (ref 4.0–10.5)

## 2015-06-24 LAB — LACTIC ACID, PLASMA
LACTIC ACID, VENOUS: 5.5 mmol/L — AB (ref 0.5–2.0)
Lactic Acid, Venous: 6.6 mmol/L (ref 0.5–2.0)

## 2015-06-24 LAB — BASIC METABOLIC PANEL
Anion gap: 13 (ref 5–15)
BUN: 31 mg/dL — ABNORMAL HIGH (ref 6–20)
CHLORIDE: 100 mmol/L — AB (ref 101–111)
CO2: 13 mmol/L — AB (ref 22–32)
CREATININE: 2.18 mg/dL — AB (ref 0.61–1.24)
Calcium: 8.4 mg/dL — ABNORMAL LOW (ref 8.9–10.3)
GFR calc non Af Amer: 33 mL/min — ABNORMAL LOW (ref >60)
GFR, EST AFRICAN AMERICAN: 38 mL/min — AB (ref >60)
Glucose, Bld: 93 mg/dL (ref 65–99)
POTASSIUM: 5.7 mmol/L — AB (ref 3.5–5.1)
SODIUM: 126 mmol/L — AB (ref 135–145)

## 2015-06-24 LAB — COMPREHENSIVE METABOLIC PANEL
ALBUMIN: 2.2 g/dL — AB (ref 3.5–5.0)
ALT: 59 U/L (ref 17–63)
AST: 461 U/L — AB (ref 15–41)
Alkaline Phosphatase: 164 U/L — ABNORMAL HIGH (ref 38–126)
Anion gap: 12 (ref 5–15)
BUN: 28 mg/dL — AB (ref 6–20)
CHLORIDE: 100 mmol/L — AB (ref 101–111)
CO2: 15 mmol/L — AB (ref 22–32)
CREATININE: 1.98 mg/dL — AB (ref 0.61–1.24)
Calcium: 9 mg/dL (ref 8.9–10.3)
GFR calc Af Amer: 43 mL/min — ABNORMAL LOW (ref >60)
GFR, EST NON AFRICAN AMERICAN: 37 mL/min — AB (ref >60)
GLUCOSE: 99 mg/dL (ref 65–99)
POTASSIUM: 5.3 mmol/L — AB (ref 3.5–5.1)
SODIUM: 127 mmol/L — AB (ref 135–145)
Total Bilirubin: 5.5 mg/dL — ABNORMAL HIGH (ref 0.3–1.2)
Total Protein: 6.6 g/dL (ref 6.5–8.1)

## 2015-06-24 LAB — RAPID URINE DRUG SCREEN, HOSP PERFORMED
AMPHETAMINES: NOT DETECTED
BARBITURATES: NOT DETECTED
BENZODIAZEPINES: NOT DETECTED
COCAINE: POSITIVE — AB
OPIATES: POSITIVE — AB
TETRAHYDROCANNABINOL: NOT DETECTED

## 2015-06-24 LAB — URINALYSIS, ROUTINE W REFLEX MICROSCOPIC
Glucose, UA: NEGATIVE mg/dL
Ketones, ur: NEGATIVE mg/dL
LEUKOCYTES UA: NEGATIVE
NITRITE: NEGATIVE
PROTEIN: NEGATIVE mg/dL
SPECIFIC GRAVITY, URINE: 1.021 (ref 1.005–1.030)
UROBILINOGEN UA: 0.2 mg/dL (ref 0.0–1.0)
pH: 5 (ref 5.0–8.0)

## 2015-06-24 LAB — I-STAT CG4 LACTIC ACID, ED: LACTIC ACID, VENOUS: 6.2 mmol/L — AB (ref 0.5–2.0)

## 2015-06-24 LAB — PROTIME-INR
INR: 1.96 — ABNORMAL HIGH (ref 0.00–1.49)
INR: 2.24 — ABNORMAL HIGH (ref 0.00–1.49)
PROTHROMBIN TIME: 24.6 s — AB (ref 11.6–15.2)
Prothrombin Time: 22.2 seconds — ABNORMAL HIGH (ref 11.6–15.2)

## 2015-06-24 LAB — I-STAT TROPONIN, ED: TROPONIN I, POC: 0 ng/mL (ref 0.00–0.08)

## 2015-06-24 LAB — LIPASE, BLOOD: Lipase: 55 U/L — ABNORMAL HIGH (ref 22–51)

## 2015-06-24 LAB — MRSA PCR SCREENING: MRSA by PCR: NEGATIVE

## 2015-06-24 LAB — APTT
APTT: 33 s (ref 24–37)
aPTT: 34 seconds (ref 24–37)

## 2015-06-24 LAB — URINE MICROSCOPIC-ADD ON

## 2015-06-24 LAB — PROCALCITONIN: Procalcitonin: 0.98 ng/mL

## 2015-06-24 MED ORDER — HYDROMORPHONE HCL 1 MG/ML IJ SOLN
1.0000 mg | INTRAMUSCULAR | Status: DC | PRN
  Administered 2015-06-24 – 2015-06-25 (×5): 1 mg via INTRAVENOUS
  Filled 2015-06-24 (×5): qty 1

## 2015-06-24 MED ORDER — ALBUTEROL SULFATE (2.5 MG/3ML) 0.083% IN NEBU: 2.5000 mg | INHALATION_SOLUTION | RESPIRATORY_TRACT | Status: DC | PRN

## 2015-06-24 MED ORDER — SODIUM CHLORIDE 0.9 % IV SOLN
Freq: Once | INTRAVENOUS | Status: AC
  Administered 2015-06-24: 07:00:00 via INTRAVENOUS

## 2015-06-24 MED ORDER — CHLORHEXIDINE GLUCONATE 0.12 % MT SOLN
15.0000 mL | Freq: Two times a day (BID) | OROMUCOSAL | Status: DC
  Administered 2015-06-24 – 2015-06-25 (×4): 15 mL via OROMUCOSAL
  Filled 2015-06-24 (×2): qty 15

## 2015-06-24 MED ORDER — IPRATROPIUM-ALBUTEROL 0.5-2.5 (3) MG/3ML IN SOLN
3.0000 mL | Freq: Four times a day (QID) | RESPIRATORY_TRACT | Status: DC
  Administered 2015-06-24 – 2015-06-25 (×5): 3 mL via RESPIRATORY_TRACT
  Filled 2015-06-24 (×5): qty 3

## 2015-06-24 MED ORDER — ONDANSETRON HCL 4 MG/2ML IJ SOLN
4.0000 mg | Freq: Four times a day (QID) | INTRAMUSCULAR | Status: DC | PRN
  Administered 2015-06-24 – 2015-06-25 (×3): 4 mg via INTRAVENOUS
  Filled 2015-06-24 (×3): qty 2

## 2015-06-24 MED ORDER — PIPERACILLIN-TAZOBACTAM 3.375 G IVPB
3.3750 g | Freq: Three times a day (TID) | INTRAVENOUS | Status: DC
  Administered 2015-06-24 – 2015-06-25 (×4): 3.375 g via INTRAVENOUS
  Filled 2015-06-24 (×3): qty 50

## 2015-06-24 MED ORDER — VANCOMYCIN HCL IN DEXTROSE 1-5 GM/200ML-% IV SOLN: 1000.0000 mg | Freq: Once | INTRAVENOUS | Status: DC

## 2015-06-24 MED ORDER — VANCOMYCIN HCL 10 G IV SOLR
1250.0000 mg | INTRAVENOUS | Status: DC
  Administered 2015-06-25: 1250 mg via INTRAVENOUS
  Filled 2015-06-24: qty 1250

## 2015-06-24 MED ORDER — FENTANYL CITRATE (PF) 100 MCG/2ML IJ SOLN
50.0000 ug | Freq: Once | INTRAMUSCULAR | Status: AC
  Administered 2015-06-24: 50 ug via INTRAVENOUS
  Filled 2015-06-24: qty 2

## 2015-06-24 MED ORDER — MORPHINE SULFATE (PF) 4 MG/ML IV SOLN
4.0000 mg | Freq: Once | INTRAVENOUS | Status: AC
  Administered 2015-06-24: 4 mg via INTRAVENOUS
  Filled 2015-06-24: qty 1

## 2015-06-24 MED ORDER — SODIUM CHLORIDE 0.9 % IV SOLN
INTRAVENOUS | Status: DC
  Administered 2015-06-24: 11:00:00 via INTRAVENOUS
  Administered 2015-06-25: 75 mL/h via INTRAVENOUS

## 2015-06-24 MED ORDER — PIPERACILLIN-TAZOBACTAM 3.375 G IVPB 30 MIN
3.3750 g | Freq: Once | INTRAVENOUS | Status: AC
  Administered 2015-06-24: 3.375 g via INTRAVENOUS
  Filled 2015-06-24: qty 50

## 2015-06-24 MED ORDER — METOCLOPRAMIDE HCL 5 MG/ML IJ SOLN
10.0000 mg | Freq: Once | INTRAMUSCULAR | Status: AC
  Administered 2015-06-24: 10 mg via INTRAVENOUS
  Filled 2015-06-24: qty 2

## 2015-06-24 MED ORDER — GUAIFENESIN ER 600 MG PO TB12
600.0000 mg | ORAL_TABLET | Freq: Two times a day (BID) | ORAL | Status: DC
  Administered 2015-06-24 – 2015-06-25 (×3): 600 mg via ORAL
  Filled 2015-06-24 (×3): qty 1

## 2015-06-24 MED ORDER — ALBUTEROL SULFATE (2.5 MG/3ML) 0.083% IN NEBU: 2.5000 mg | INHALATION_SOLUTION | Freq: Four times a day (QID) | RESPIRATORY_TRACT | Status: DC

## 2015-06-24 MED ORDER — GI COCKTAIL ~~LOC~~
30.0000 mL | Freq: Three times a day (TID) | ORAL | Status: DC | PRN
  Administered 2015-06-24: 30 mL via ORAL
  Filled 2015-06-24: qty 30

## 2015-06-24 MED ORDER — LACTULOSE 10 GM/15ML PO SOLN
20.0000 g | Freq: Two times a day (BID) | ORAL | Status: DC
  Administered 2015-06-24 – 2015-06-25 (×3): 20 g via ORAL
  Filled 2015-06-24 (×4): qty 30

## 2015-06-24 MED ORDER — FOLIC ACID 1 MG PO TABS
1.0000 mg | ORAL_TABLET | Freq: Every day | ORAL | Status: DC
  Administered 2015-06-24: 1 mg via ORAL
  Filled 2015-06-24: qty 1

## 2015-06-24 MED ORDER — ONDANSETRON HCL 4 MG/2ML IJ SOLN
4.0000 mg | Freq: Once | INTRAMUSCULAR | Status: AC
  Administered 2015-06-24: 4 mg via INTRAVENOUS
  Filled 2015-06-24: qty 2

## 2015-06-24 MED ORDER — VITAMIN B-1 100 MG PO TABS
100.0000 mg | ORAL_TABLET | Freq: Every day | ORAL | Status: DC
  Administered 2015-06-24: 100 mg via ORAL
  Filled 2015-06-24: qty 1

## 2015-06-24 MED ORDER — SPIRONOLACTONE 25 MG PO TABS
100.0000 mg | ORAL_TABLET | Freq: Two times a day (BID) | ORAL | Status: DC
  Administered 2015-06-24: 100 mg via ORAL
  Filled 2015-06-24: qty 4

## 2015-06-24 MED ORDER — IPRATROPIUM BROMIDE 0.02 % IN SOLN: 0.5000 mg | Freq: Four times a day (QID) | RESPIRATORY_TRACT | Status: DC

## 2015-06-24 MED ORDER — CETYLPYRIDINIUM CHLORIDE 0.05 % MT LIQD
7.0000 mL | Freq: Two times a day (BID) | OROMUCOSAL | Status: DC
  Administered 2015-06-25: 7 mL via OROMUCOSAL

## 2015-06-24 MED ORDER — ONDANSETRON HCL 4 MG PO TABS: 4.0000 mg | ORAL_TABLET | Freq: Four times a day (QID) | ORAL | Status: DC | PRN

## 2015-06-24 MED ORDER — SODIUM CHLORIDE 0.9 % IV SOLN
2000.0000 mg | Freq: Once | INTRAVENOUS | Status: AC
  Administered 2015-06-24: 2000 mg via INTRAVENOUS
  Filled 2015-06-24: qty 2000

## 2015-06-24 MED ORDER — SODIUM POLYSTYRENE SULFONATE 15 GM/60ML PO SUSP
30.0000 g | Freq: Once | ORAL | Status: AC
  Administered 2015-06-24: 30 g via ORAL
  Filled 2015-06-24 (×2): qty 120

## 2015-06-24 MED ORDER — GUAIFENESIN-DM 100-10 MG/5ML PO SYRP: 5.0000 mL | ORAL_SOLUTION | ORAL | Status: DC | PRN

## 2015-06-24 NOTE — Consult Note (Signed)
Reason for Consult: RUQ abdominal pain Referring Physician: Dr. Mart Piggs  Oncology: Dr. 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  Duration of symptoms is 1 week.  Onset was gradual coarse is worsening.  Acutely worsened yesterday after the MRI.  Associated with nausea and vomiting.  Symptoms have been intermittent throughout the day.  Even though he endorses to worsening symptoms with PO intake, he has been eating fairly well.  Endorses chills and sweats.  No change in bowel pattern.  Modifying factors include; pepto bismol with cessation of symptoms only to return in a few hours. Location is RUQ with radiation to pelvis and chest.  Hurts worse with inspiration.    ED work up shows WBC 17.1k, hyponatremia, acute renal failure, increased transaminase from baseline with a T bilirubin of 5.5.  Lactic acid 6.2.  Abdominal US shows a normal CBD diameter, gallstones, mildly thickened gallbladder, but not distended.  When comparing with CT of abdomen and pelvis January 2015, this is actually improved.      INR 1.98.  Unfortunately, the patient still drinks.  Endorses to "a drink every once in a while."  He still smokes.     Past Medical History  Diagnosis Date  . Diabetes mellitus without complication   . Schizophrenia     onset age 26.  in and out of psych facilities from age 61 to age 31.  s/p electroconvulsive therapy.  . TB (tuberculosis)     sister says this dx'd at age 56 and treated  . Hepatic encephalopathy   . Esophageal varices   . GI bleed   . Cirrhosis   . Thrombocytopenia   . Alcohol abuse   . Mild Coagulopathy 11/08/2013  . Hepatitis C 12/02/2013  . Smoking 09/23/2013  . Cancer     Liver    Past Surgical History  Procedure Laterality Date  . Cataract surgery   Bilateral   . Esophagogastroduodenoscopy N/A 11/08/2013    Procedure: ESOPHAGOGASTRODUODENOSCOPY (EGD);  Surgeon: Milus Banister, MD;  Location: [facility];  Service: Endoscopy;  Laterality: N/A;  bedside  . Radiology with anesthesia N/A 12/26/2013    Procedure: Transjugular Intrahepatic Portosystemic Shunt (TIPS);  Surgeon: Carylon Perches, MD;  Location: [facility];  Service: Radiology;  Laterality: N/A;    Family History  Problem Relation Age of Onset  . Hypertension Mother   . Colon cancer Neg Hx   . Lung cancer Mother     smoker  . Colon polyps Sister     twin sister    Social History:  reports that he has been smoking Cigarettes.  He has a 12.5 pack-year smoking history. He has never used smokeless tobacco. He reports that he drinks alcohol. He reports that he does not use illicit drugs.  Allergies:  Allergies  Allergen Reactions  . Haldol [Haloperidol] Swelling and Other (See Comments)    afib problems    Medications:  Prior to Admission medications   Medication Sig Start Date End Date Taking? Authorizing Provider  albuterol (PROVENTIL HFA;VENTOLIN HFA) 108 (90 BASE) MCG/ACT inhaler Inhale 2 puffs into the lungs every 6 (six) hours as needed for wheezing or shortness of breath. 03/19/15  Yes Tresa Garter, MD  albuterol (PROVENTIL) (2.5 MG/3ML) 0.083% nebulizer solution Take 3 mLs (2.5 mg total)  by nebulization every 6 (six) hours as needed for wheezing or shortness of breath. 03/19/15  Yes Tresa Garter, MD  aspirin 325 MG tablet Take 325 mg by mouth daily as needed for moderate pain.    Yes Historical Provider, MD  folic acid (FOLVITE) 1 MG tablet Take 1 tablet (1 mg total) by mouth daily. 03/19/15  Yes Tresa Garter, MD  furosemide (LASIX) 40 MG tablet Take 1 tablet (40 mg total) by mouth 2 (two) times daily. 03/19/15  Yes Tresa Garter, MD  lactulose (CHRONULAC) 10 GM/15ML solution Take 30 mLs (20 g total) by mouth 2 (two) times daily. Take 20 G (30 ml) at  breakfast and take 20 G (30 ml) at lunch. 03/19/15  Yes Tresa Garter, MD  oxyCODONE (OXY IR/ROXICODONE) 5 MG immediate release tablet Take 1 tablet (5 mg total) by mouth every 3 (three) hours as needed for moderate pain or severe pain. 06/18/15  Yes Truitt Merle, MD  pantoprazole (PROTONIX) 40 MG tablet Take 1 tablet (40 mg total) by mouth daily. 03/19/15  Yes Tresa Garter, MD  spironolactone (ALDACTONE) 100 MG tablet Take 1 tablet (100 mg total) by mouth 2 (two) times daily. 03/19/15  Yes Tresa Garter, MD  thiamine (VITAMIN B-1) 100 MG tablet Take 1 tablet (100 mg total) by mouth daily. 03/19/15  Yes Tresa Garter, MD  beclomethasone (QVAR) 80 MCG/ACT inhaler Inhale 2 puffs into the lungs 2 (two) times daily. 03/19/15   Olugbemiga Essie Christine, MD  ENSURE (ENSURE) Take 1 Can by mouth 2 (two) times daily between meals. Patient not taking: Reported on 06/02/2015 09/16/14   Milus Banister, MD  rifaximin (XIFAXAN) 550 MG TABS tablet Take 1 tablet (550 mg total) by mouth 2 (two) times daily. Patient not taking: Reported on 06/10/2015 08/15/14   Milus Banister, MD     Results for orders placed or performed during the hospital encounter of 06/11/2015 (from the past 48 hour(s))  CBC with Differential/Platelet     Status: Abnormal   Collection Time: 06/16/2015  6:13 AM  Result Value Ref Range   WBC 17.1 (H) 4.0 - 10.5 K/uL   RBC 3.26 (L) 4.22 - 5.81 MIL/uL   Hemoglobin 10.4 (L) 13.0 - 17.0 g/dL   HCT 30.4 (L) 39.0 - 52.0 %   MCV 93.3 78.0 - 100.0 fL   MCH 31.9 26.0 - 34.0 pg   MCHC 34.2 30.0 - 36.0 g/dL   RDW 20.8 (H) 11.5 - 15.5 %   Platelets 144 (L) 150 - 400 K/uL   Neutrophils Relative % 82 (H) 43 - 77 %   Neutro Abs 14.0 (H) 1.7 - 7.7 K/uL   Lymphocytes Relative 6 (L) 12 - 46 %   Lymphs Abs 1.0 0.7 - 4.0 K/uL   Monocytes Relative 12 3 - 12 %   Monocytes Absolute 2.1 (H) 0.1 - 1.0 K/uL   Eosinophils Relative 0 0 - 5 %   Eosinophils Absolute 0.1 0.0 - 0.7 K/uL   Basophils Relative  0 0 - 1 %   Basophils Absolute 0.0 0.0 - 0.1 K/uL  Comprehensive metabolic panel     Status: Abnormal   Collection Time: 06/13/2015  6:13 AM  Result Value Ref Range   Sodium 127 (L) 135 - 145 mmol/L   Potassium 5.3 (H) 3.5 - 5.1 mmol/L   Chloride 100 (L) 101 - 111 mmol/L   CO2 15 (L) 22 - 32 mmol/L   Glucose,  Bld 99 65 - 99 mg/dL   BUN 28 (H) 6 - 20 mg/dL   Creatinine, Ser 1.98 (H) 0.61 - 1.24 mg/dL   Calcium 9.0 8.9 - 10.3 mg/dL   Total Protein 6.6 6.5 - 8.1 g/dL   Albumin 2.2 (L) 3.5 - 5.0 g/dL   AST 461 (H) 15 - 41 U/L   ALT 59 17 - 63 U/L   Alkaline Phosphatase 164 (H) 38 - 126 U/L   Total Bilirubin 5.5 (H) 0.3 - 1.2 mg/dL   GFR calc non Af Amer 37 (L) >60 mL/min   GFR calc Af Amer 43 (L) >60 mL/min    Comment: (NOTE) The eGFR has been calculated using the CKD EPI equation. This calculation has not been validated in all clinical situations. eGFR's persistently <60 mL/min signify possible Chronic Kidney Disease.    Anion gap 12 5 - 15  Lipase, blood     Status: Abnormal   Collection Time: 06/21/2015  6:13 AM  Result Value Ref Range   Lipase 55 (H) 22 - 51 U/L  Protime-INR     Status: Abnormal   Collection Time: 06/23/2015  6:13 AM  Result Value Ref Range   Prothrombin Time 22.2 (H) 11.6 - 15.2 seconds   INR 1.96 (H) 0.00 - 1.49  APTT     Status: None   Collection Time: 06/05/2015  6:13 AM  Result Value Ref Range   aPTT 33 24 - 37 seconds  I-stat troponin, ED     Status: None   Collection Time: 06/15/2015  6:38 AM  Result Value Ref Range   Troponin i, poc 0.00 0.00 - 0.08 ng/mL   Comment 3            Comment: Due to the release kinetics of cTnI, a negative result within the first hours of the onset of symptoms does not rule out myocardial infarction with certainty. If myocardial infarction is still suspected, repeat the test at appropriate intervals.   I-Stat CG4 Lactic Acid, ED     Status: Abnormal   Collection Time: 06/06/2015  6:49 AM  Result Value Ref Range   Lactic  Acid, Venous 6.20 (HH) 0.5 - 2.0 mmol/L   Comment NOTIFIED PHYSICIAN     Dg Chest 2 View  06/29/2015   CLINICAL DATA:  Sepsis.  EXAM: CHEST  2 VIEW  COMPARISON:  December 26, 2013.  FINDINGS: The heart size and mediastinal contours are within normal limits. Both lungs are clear. No pneumothorax or pleural effusion is noted. The visualized skeletal structures are unremarkable.  IMPRESSION: No active cardiopulmonary disease.   Electronically Signed   By: Marijo Conception, M.D.   On: 07/01/2015 08:48   Mr Abdomen W Wo Contrast  06/23/2015   CLINICAL DATA:  Hepatocellular carcinoma status post chemo embolization treatment. Post TIPS. History of hepatitis-C. Elevated AFP, assess for response to treatment and disease progression.  EXAM: MRI ABDOMEN WITHOUT AND WITH CONTRAST  TECHNIQUE: Multiplanar multisequence MR imaging of the abdomen was performed both before and after the administration of intravenous contrast.  CONTRAST:  10 cc Eovist IV contrast  COMPARISON:  MRI 03/17/2015  FINDINGS: Lower chest:  Lung bases are clear.  Significant image degradation due to respiratory motion artifact.  Hepatobiliary: Diffusely nodular hepatic contour, increased since previously, with overall subjective decrease in hepatic volume. No intrahepatic ductal dilatation is identified. Mildly T2 hyperintense 1.6 cm lateral segment left hepatic lobe hemangioma image 23 series 8 is essentially stable.  Within the  posterior segment right hepatic lobe is an inhomogeneous oval mass corresponding to the previously diagnosed hepatocellular carcinoma measuring 5.6 x 5.3 cm image 13 series 7, and measuring 5.6 x 5.1 cm image 26 series 504, not significantly changed allowing for differences in technique and slice selection plane. The mass is predominantly hypoenhancing with either adjacent liver capsule artifact or a few foci of peripheral enhancement posteriorly at postcontrast imaging.  Postcontrast imaging is significantly motion degraded.  There are a few mildly T1 hyperintense nodules at precontrast imaging, including a representative 1.9 cm nodule within the posterior right hepatic lobe image 46 series 500. This is not significantly changed, although there is a new anterior segment right hepatic lobe T1 hyperintense nodule in a subcapsular location measuring 1.6 cm image 53 series 5.  Gallstones are reidentified within the mildly distended gallbladder. Trace pericholecystic fluid and potentially gallbladder wall thickening are noted.  Pancreas: Grossly normal  Spleen: Grossly normal  Adrenals/Urinary Tract: Adrenal glands are not well visualized but grossly unremarkable. Kidneys appear normal.  Stomach/Bowel: No dilated loop of bowel.  Vascular/Lymphatic: No aortic aneurysm. No lymphadenopathy although assessment is suboptimal.  Other: Small amount of ascites is present.  Musculoskeletal: Normal  IMPRESSION: Significantly image degraded exam with no significant change in size of hypoenhancing posterior segment right hepatic lobe hepatocellular carcinoma status post chemoembolization.  The liver is increasingly nodular and shrunken compatible with cirrhosis although treatment effect or diffuse hepatic metastatic disease could appear similar.  There remain several nodules throughout the liver with signal intensity which may suggest dysplastic nodules or hepatocellular carcinoma, and these are predominantly stable with the exception of the new subcapsular anterior segment right hepatic lobe nodule.  Small ascites.   Electronically Signed   By: Conchita Paris M.D.   On: 06/23/2015 11:50   US Abdomen Limited  06/06/2015   CLINICAL DATA:  Right upper quadrant pain. History of hepatitis-C and hepatocellular carcinoma.  EXAM: US ABDOMEN LIMITED - RIGHT UPPER QUADRANT  COMPARISON:  MRI 06/23/2015  FINDINGS: Gallbladder:  Small echogenic foci in the gallbladder are compatible with stones. Largest measures 0.9 cm. In addition, there appears to be a small  heterogeneous gallbladder polyp measuring 0.4 cm. Gallbladder wall is mildly thickened measuring up to 0.4 cm. Gallbladder is not distended. Patient did not have a sonographic Murphy's sign.  Common bile duct:  Diameter: 0.4 cm  Liver:  Liver is nodular and heterogeneous. There is a heterogeneous lesion along the posterior hepatic dome measuring up to 5.9 cm and compatible with known hepatoma. Small amount of perihepatic ascites. There appears to be flow in the TIPS stent. There is an additional liver lesion measuring up to 3.4 cm.  IMPRESSION: Small gallstones and small polyp.  Cirrhosis with hepatic lesions. The largest lesion is compatible with the known hepatocellular carcinoma. There is an additional lesion measuring up to 3.4 cm which could represent hepatocellular carcinoma versus dysplastic nodule based on the previous MRI.  Small amount of perihepatic ascites.  Flow in the TIPS stent.   Electronically Signed   By: Markus Daft M.D.   On: 06/22/2015 07:29    Review of Systems  Constitutional: Positive for fever, chills, malaise/fatigue and diaphoresis.  Respiratory: Positive for shortness of breath. Negative for cough, hemoptysis, sputum production and wheezing.   Cardiovascular: Positive for chest pain. Negative for palpitations, orthopnea, claudication, leg swelling and PND.  Gastrointestinal: Positive for heartburn, nausea, vomiting and abdominal pain.  Genitourinary: Positive for dysuria. Negative for urgency, frequency, hematuria and flank  pain.  Neurological: Negative for dizziness, tingling, tremors, sensory change, speech change, focal weakness, seizures, loss of consciousness and weakness.   Blood pressure 135/58, pulse 79, temperature 97.6 F (36.4 C), temperature source Oral, resp. rate 21, height 5' 9"  (1.753 m), weight 104.5 kg (230 lb 6.1 oz), SpO2 92 %. Physical Exam  Constitutional: He is oriented to person, place, and time. He appears well-developed and well-nourished. No  distress.  Cardiovascular: Normal rate, regular rhythm, normal heart sounds and intact distal pulses.  Exam reveals no gallop and no friction rub.   No murmur heard. Respiratory: Effort normal and breath sounds normal. No respiratory distress. He has no wheezes. He has no rales. He exhibits no tenderness.  GI: Soft. Bowel sounds are normal. He exhibits no mass. There is no rebound and no guarding.  TTP to RUQ  Musculoskeletal: Normal range of motion. He exhibits no edema or tenderness.  Neurological: He is alert and oriented to person, place, and time.  Skin: Skin is warm and dry. No rash noted. He is not diaphoretic. No erythema. No pallor.  Psychiatric: He has a normal mood and affect. His behavior is normal. Judgment and thought content normal.    Assessment/Plan: RUQ abdominal pain, cholelithiasis and sepsis in a 54 year old patient with hepatocellular carcinoma, hepatitis C, cirrhosis, esophageal varices, coagulopathy.  He is not a surgical candidate given advanced cirrhosis and cancer. We recommend medical management with antibiotics, pain control, IV hydration.  Thank you for the consult.    Militza Devery ANP-BC 06/10/2015, 8:57 AM

## 2015-06-24 NOTE — ED Provider Notes (Signed)
CSN: [id number]     Arrival date & time 06/06/2015  0532 History   None    Chief Complaint  Patient presents with  . Abdominal Pain  . Emesis  . Diarrhea     (Consider location/radiation/quality/duration/timing/severity/associated sxs/prior Treatment) HPI Comments: Oscar Swanson is a 54 y.o. male with a PMHx of DM2, esophageal varices, prior GI bleed, cirrhosis, HepC, hepatocellular carcinoma, alcohol abuse, thrombocytopenia, and schizophrenia, with a PSHx of TIPS procedure, who presents to the ED with complaints of gradual onset right upper quadrant abdominal pain 1 day. He reports pain is 10/10 constant stabbing nonradiating pain worse with lying on his left side and with no treatments tried prior to arrival. Associated symptoms include nausea, 10 episodes of emesis with "speckles of blood" and 10 episodes of watery diarrhea with "a little bit of blood". He does not quantify or describe the blood in either his stool or his emesis. Additionally reports decreased urination since yesterday. NOTE: His history is somewhat limited by pt being in pain and not cooperating entirely with questioning.  He denies any fevers, chills, chest pain or shortness breath, constipation, rectal pain, melena, obstipation, dysuria, hematuria, numbness, tingling, weakness, recent travel, sick contacts, suspicious food intake, alcohol use, or NSAIDs.  His oncologist is Dr. Burr Medico. PCP: Angelica Chessman, MD. Gastroenterologist: Ardis Hughs  Patient is a 54 y.o. male presenting with abdominal pain, vomiting, and diarrhea. The history is provided by the patient. No language interpreter was used.  Abdominal Pain Pain location:  RUQ Pain quality: stabbing   Pain radiates to:  Does not radiate Pain severity:  Severe Onset quality:  Gradual Duration:  1 day Timing:  Constant Progression:  Unchanged Chronicity:  New Context: not alcohol use, not recent travel, not sick contacts and not suspicious food intake   Relieved  by:  None tried Worsened by:  Position changes Ineffective treatments:  None tried Associated symptoms: diarrhea, hematemesis, nausea and vomiting   Associated symptoms: no chest pain, no chills, no constipation, no dysuria, no fever, no hematuria and no shortness of breath   Risk factors comment:  Hepatocellular carcinoma Emesis Associated symptoms: abdominal pain and diarrhea   Associated symptoms: no arthralgias, no chills and no myalgias   Diarrhea Associated symptoms: abdominal pain and vomiting   Associated symptoms: no arthralgias, no chills, no fever and no myalgias     Past Medical History  Diagnosis Date  . Diabetes mellitus without complication   . Schizophrenia     onset age 66.  in and out of psych facilities from age 19 to age 76.  s/p electroconvulsive therapy.  . TB (tuberculosis)     sister says this dx'd at age 38 and treated  . Hepatic encephalopathy   . Esophageal varices   . GI bleed   . Cirrhosis   . Thrombocytopenia   . Alcohol abuse   . Mild Coagulopathy 11/08/2013  . Hepatitis C 12/02/2013  . Smoking 09/23/2013  . Cancer     Liver   Past Surgical History  Procedure Laterality Date  . Cataract surgery  Bilateral   . Esophagogastroduodenoscopy N/A 11/08/2013    Procedure: ESOPHAGOGASTRODUODENOSCOPY (EGD);  Surgeon: Milus Banister, MD;  Location: [facility];  Service: Endoscopy;  Laterality: N/A;  bedside  . Radiology with anesthesia N/A 12/26/2013    Procedure: Transjugular Intrahepatic Portosystemic Shunt (TIPS);  Surgeon: Carylon Perches, MD;  Location: [facility];  Service: Radiology;  Laterality: N/A;   Family History  Problem Relation Age of Onset  .  Hypertension Mother   . Colon cancer Neg Hx   . Lung cancer Mother     smoker  . Colon polyps Sister     twin sister   Social History  Substance Use Topics  . Smoking status: Current Some Day Smoker -- 0.50 packs/day for 25 years    Types: Cigarettes  . Smokeless tobacco: Never Used     Comment:  Tobacco information given 02/10/14  . Alcohol Use: 0.0 oz/week    0 Standard drinks or equivalent per week     Comment: Last drink: 3 weeks ago    Review of Systems  Constitutional: Negative for fever and chills.  Respiratory: Negative for shortness of breath.   Cardiovascular: Negative for chest pain.  Gastrointestinal: Positive for nausea, vomiting, abdominal pain, diarrhea, blood in stool ("a little bit") and hematemesis. Negative for constipation and rectal pain.  Genitourinary: Positive for decreased urine volume. Negative for dysuria and hematuria.  Musculoskeletal: Negative for myalgias and arthralgias.  Skin: Negative for color change.  Allergic/Immunologic: Positive for immunocompromised state.  Neurological: Negative for weakness and numbness.  Psychiatric/Behavioral: Negative for confusion.   10 Systems reviewed and are negative for acute change except as noted in the HPI.    Allergies  Haldol  Home Medications   Prior to Admission medications   Medication Sig Start Date End Date Taking? Authorizing Provider  albuterol (PROVENTIL HFA;VENTOLIN HFA) 108 (90 BASE) MCG/ACT inhaler Inhale 2 puffs into the lungs every 6 (six) hours as needed for wheezing or shortness of breath. 03/19/15  Yes Tresa Garter, MD  albuterol (PROVENTIL) (2.5 MG/3ML) 0.083% nebulizer solution Take 3 mLs (2.5 mg total) by nebulization every 6 (six) hours as needed for wheezing or shortness of breath. 03/19/15  Yes Tresa Garter, MD  folic acid (FOLVITE) 1 MG tablet Take 1 tablet (1 mg total) by mouth daily. 03/19/15  Yes Tresa Garter, MD  furosemide (LASIX) 40 MG tablet Take 1 tablet (40 mg total) by mouth 2 (two) times daily. 03/19/15  Yes Tresa Garter, MD  aspirin 325 MG tablet Take 325 mg by mouth daily as needed for moderate pain.     Historical Provider, MD  beclomethasone (QVAR) 80 MCG/ACT inhaler Inhale 2 puffs into the lungs 2 (two) times daily. 03/19/15   Olugbemiga Essie Christine, MD  ENSURE (ENSURE) Take 1 Can by mouth 2 (two) times daily between meals. Patient not taking: Reported on 06/02/2015 09/16/14   Milus Banister, MD  lactulose [facility]) 10 GM/15ML solution Take 30 mLs (20 g total) by mouth 2 (two) times daily. Take 20 G (30 ml) at breakfast and take 20 G (30 ml) at lunch. 03/19/15   Tresa Garter, MD  oxyCODONE (OXY IR/ROXICODONE) 5 MG immediate release tablet Take 1 tablet (5 mg total) by mouth every 3 (three) hours as needed for moderate pain or severe pain. 06/18/15   Truitt Merle, MD  pantoprazole (PROTONIX) 40 MG tablet Take 1 tablet (40 mg total) by mouth daily. 03/19/15   Tresa Garter, MD  rifaximin (XIFAXAN) 550 MG TABS tablet Take 1 tablet (550 mg total) by mouth 2 (two) times daily. 08/15/14   Milus Banister, MD  spironolactone (ALDACTONE) 100 MG tablet Take 1 tablet (100 mg total) by mouth 2 (two) times daily. 03/19/15   Tresa Garter, MD  thiamine (VITAMIN B-1) 100 MG tablet Take 1 tablet (100 mg total) by mouth daily. 03/19/15   Tresa Garter, MD  BP 131/62 mmHg  Pulse 91  Temp(Src) 97.8 F (36.6 C) (Oral)  Resp 18  Ht 5\' 9"  (1.753 m)  Wt 223 lb (101.152 kg)  BMI 32.92 kg/m2  SpO2 100% Physical Exam  Constitutional: He is oriented to person, place, and time. Vital signs are normal. He appears well-developed and well-nourished.  Non-toxic appearance. He appears distressed.  Afebrile, nontoxic, moaning in pain. Appears older than stated age.   HENT:  Head: Normocephalic and atraumatic.  Mouth/Throat: Oropharynx is clear and moist and mucous membranes are normal.  Eyes: Conjunctivae and EOM are normal. Right eye exhibits no discharge. Left eye exhibits no discharge.  Neck: Normal range of motion. Neck supple.  Cardiovascular: Normal rate, regular rhythm, normal heart sounds and intact distal pulses.  Exam reveals no gallop and no friction rub.   No murmur heard. Pulmonary/Chest: Effort normal and breath sounds normal.  No respiratory distress. He has no decreased breath sounds. He has no wheezes. He has no rhonchi. He has no rales.  Abdominal: Soft. Normal appearance and bowel sounds are normal. He exhibits distension. There is tenderness in the right upper quadrant. There is no rigidity, no rebound, no guarding, no CVA tenderness and no tenderness at McBurney's point. Murphy's sign: unable to perform.    Soft, mildly distended, +BS throughout, with RUQ TTP, no r/g/r. Unable to perform murphy's exam due to pt's discomfort, neg mcburney's, no CVA TTP   Musculoskeletal: Normal range of motion.  Neurological: He is alert and oriented to person, place, and time. He has normal strength. No sensory deficit.  Skin: Skin is warm, dry and intact. No rash noted.  Jaundiced   Psychiatric: He has a normal mood and affect.  Nursing note and vitals reviewed.   ED Course  Procedures (including critical care time)  CRITICAL CARE- Sepsis Performed by: Corine Shelter   Total critical care time: 28mins  Critical care time was exclusive of separately billable procedures and treating other patients.  Critical care was necessary to treat or prevent imminent or life-threatening deterioration.  Critical care was time spent personally by me on the following activities: development of treatment plan with patient and/or surrogate as well as nursing, discussions with consultants, evaluation of patient's response to treatment, examination of patient, obtaining history from patient or surrogate, ordering and performing treatments and interventions, ordering and review of laboratory studies, ordering and review of radiographic studies, pulse oximetry and re-evaluation of patient's condition.   Labs Review Labs Reviewed  CBC WITH DIFFERENTIAL/PLATELET - Abnormal; Notable for the following:    WBC 17.1 (*)    RBC 3.26 (*)    Hemoglobin 10.4 (*)    HCT 30.4 (*)    RDW 20.8 (*)    Platelets 144 (*)     Neutrophils Relative % 82 (*)    Neutro Abs 14.0 (*)    Lymphocytes Relative 6 (*)    Monocytes Absolute 2.1 (*)    All other components within normal limits  COMPREHENSIVE METABOLIC PANEL - Abnormal; Notable for the following:    Sodium 127 (*)    Potassium 5.3 (*)    Chloride 100 (*)    CO2 15 (*)    BUN 28 (*)    Creatinine, Ser 1.98 (*)    Albumin 2.2 (*)    AST 461 (*)    Alkaline Phosphatase 164 (*)    Total Bilirubin 5.5 (*)    GFR calc non Af Amer 37 (*)    GFR calc Af  Amer 43 (*)    All other components within normal limits  LIPASE, BLOOD - Abnormal; Notable for the following:    Lipase 55 (*)    All other components within normal limits  PROTIME-INR - Abnormal; Notable for the following:    Prothrombin Time 22.2 (*)    INR 1.96 (*)    All other components within normal limits  I-STAT CG4 LACTIC ACID, ED - Abnormal; Notable for the following:    Lactic Acid, Venous 6.20 (*)    All other components within normal limits  CULTURE, BLOOD (ROUTINE X 2)  CULTURE, BLOOD (ROUTINE X 2)  URINE CULTURE  APTT  URINALYSIS, ROUTINE W REFLEX MICROSCOPIC (NOT AT [facility])  Randolm Idol, ED  POC OCCULT BLOOD, ED    Imaging Review Mr Abdomen W Wo Contrast  06/23/2015   CLINICAL DATA:  Hepatocellular carcinoma status post chemo embolization treatment. Post TIPS. History of hepatitis-C. Elevated AFP, assess for response to treatment and disease progression.  EXAM: MRI ABDOMEN WITHOUT AND WITH CONTRAST  TECHNIQUE: Multiplanar multisequence MR imaging of the abdomen was performed both before and after the administration of intravenous contrast.  CONTRAST:  10 cc Eovist IV contrast  COMPARISON:  MRI 03/17/2015  FINDINGS: Lower chest:  Lung bases are clear.  Significant image degradation due to respiratory motion artifact.  Hepatobiliary: Diffusely nodular hepatic contour, increased since previously, with overall subjective decrease in hepatic volume. No intrahepatic ductal dilatation is  identified. Mildly T2 hyperintense 1.6 cm lateral segment left hepatic lobe hemangioma image 23 series 8 is essentially stable.  Within the posterior segment right hepatic lobe is an inhomogeneous oval mass corresponding to the previously diagnosed hepatocellular carcinoma measuring 5.6 x 5.3 cm image 13 series 7, and measuring 5.6 x 5.1 cm image 26 series 504, not significantly changed allowing for differences in technique and slice selection plane. The mass is predominantly hypoenhancing with either adjacent liver capsule artifact or a few foci of peripheral enhancement posteriorly at postcontrast imaging.  Postcontrast imaging is significantly motion degraded. There are a few mildly T1 hyperintense nodules at precontrast imaging, including a representative 1.9 cm nodule within the posterior right hepatic lobe image 46 series 500. This is not significantly changed, although there is a new anterior segment right hepatic lobe T1 hyperintense nodule in a subcapsular location measuring 1.6 cm image 53 series 5.  Gallstones are reidentified within the mildly distended gallbladder. Trace pericholecystic fluid and potentially gallbladder wall thickening are noted.  Pancreas: Grossly normal  Spleen: Grossly normal  Adrenals/Urinary Tract: Adrenal glands are not well visualized but grossly unremarkable. Kidneys appear normal.  Stomach/Bowel: No dilated loop of bowel.  Vascular/Lymphatic: No aortic aneurysm. No lymphadenopathy although assessment is suboptimal.  Other: Small amount of ascites is present.  Musculoskeletal: Normal  IMPRESSION: Significantly image degraded exam with no significant change in size of hypoenhancing posterior segment right hepatic lobe hepatocellular carcinoma status post chemoembolization.  The liver is increasingly nodular and shrunken compatible with cirrhosis although treatment effect or diffuse hepatic metastatic disease could appear similar.  There remain several nodules throughout the liver  with signal intensity which may suggest dysplastic nodules or hepatocellular carcinoma, and these are predominantly stable with the exception of the new subcapsular anterior segment right hepatic lobe nodule.  Small ascites.   Electronically Signed   By: Conchita Paris M.D.   On: 06/23/2015 11:50   US Abdomen Limited  06/18/2015   CLINICAL DATA:  Right upper quadrant pain. History of hepatitis-C and hepatocellular  carcinoma.  EXAM: US ABDOMEN LIMITED - RIGHT UPPER QUADRANT  COMPARISON:  MRI 06/23/2015  FINDINGS: Gallbladder:  Small echogenic foci in the gallbladder are compatible with stones. Largest measures 0.9 cm. In addition, there appears to be a small heterogeneous gallbladder polyp measuring 0.4 cm. Gallbladder wall is mildly thickened measuring up to 0.4 cm. Gallbladder is not distended. Patient did not have a sonographic Murphy's sign.  Common bile duct:  Diameter: 0.4 cm  Liver:  Liver is nodular and heterogeneous. There is a heterogeneous lesion along the posterior hepatic dome measuring up to 5.9 cm and compatible with known hepatoma. Small amount of perihepatic ascites. There appears to be flow in the TIPS stent. There is an additional liver lesion measuring up to 3.4 cm.  IMPRESSION: Small gallstones and small polyp.  Cirrhosis with hepatic lesions. The largest lesion is compatible with the known hepatocellular carcinoma. There is an additional lesion measuring up to 3.4 cm which could represent hepatocellular carcinoma versus dysplastic nodule based on the previous MRI.  Small amount of perihepatic ascites.  Flow in the TIPS stent.   Electronically Signed   By: Markus Daft M.D.   On: 06/20/2015 07:29   I have personally reviewed and evaluated these images and lab results as part of my medical decision-making.   EKG Interpretation None      MDM   Final diagnoses:  RUQ abdominal pain  Nausea vomiting and diarrhea  Leukocytosis  Elevated lactic acid level  Anemia, unspecified anemia  type  Hyponatremia  Hyperkalemia  AKI (acute kidney injury)  Elevated transaminase level  Thrombocytopenia  Elevated INR  Elevated lipase  Sepsis    54 y.o. male here with RUQ abd pain, n/v/d since yesterday. Hx of liver cancer, had MRI yesterday that showed gallstones in a mildly distended gallbladder with trace pericholecystic fluid and potentially gallbladder wall thickening is noted. On exam, pt with mildly distended abdomen, +BS throughout, with RUQ TTP, difficult to complete murphy's exam due to pt's discomfort. Will obtain labs and ultrasound to evaluate for cholecystitis. Will give pain meds and nausea meds. Will reassess shortly.   7:03 AM CBC w/diff showing lower H/H than baseline, and leukocytosis with neutrophilic predominance. Lactic acid 6.20, will get blood cultures and start broad spec abx. Will proceed with gentle hydration given pt's ascites. Trop neg. EKG with NSR and PACs, low voltage leads, but similar to prior EKGs. CMP and lipase pending. INR 1.96, APTT WNL. Awaiting abd U/S. Will obtain FOBT. Will reassess shortly.   7:37 AM Pt still having pain and nauseated. Will give fentanyl and reglan. CMP with hyponatremia, mild hyperkalemia, elevated BUN/Cr at 28/1.98 respectively. Elevated AST double compared to baseline. Elevated alkphos more than baseline, as well as Bili 5.5 which is more than double his baseline. Lipase 55. U/S with stones and polyp, mild gallbladder wall thickening without distension, no sonographic murphy's. Will proceed with admission.   7:54 AM Dr. Doyle Askew returning page, would like CXR given his sepsis criteria. Would also like surgery to see pt in consultation given that pt has higher than normal bili and AST, concern for possible obstruction from gallstones.  8:13 AM Emina Reibock PA-C for general surgery returning page, will consult on pt. Please see admission notes for further documentation of care. Pt stable at this time. Urine and CXR pending.  BP  122/56 mmHg  Pulse 84  Temp(Src) 97.7 F (36.5 C) (Oral)  Resp 23  Ht 5\' 9"  (1.753 m)  Wt 223 lb (101.152  kg)  BMI 32.92 kg/m2  SpO2 99%  Meds ordered this encounter  Medications  . morphine 4 MG/ML injection 4 mg    Sig:   . ondansetron (ZOFRAN) injection 4 mg    Sig:   . 0.9 %  sodium chloride infusion    Sig:   . piperacillin-tazobactam (ZOSYN) IVPB 3.375 g    Sig:     Order Specific Question:  Antibiotic Indication:    Answer:  Sepsis  . DISCONTD: vancomycin (VANCOCIN) IVPB 1000 mg/200 mL premix    Sig:     Order Specific Question:  Indication:    Answer:  Sepsis  . vancomycin (VANCOCIN) 2,000 mg in sodium chloride 0.9 % 500 mL IVPB    Sig:     Order Specific Question:  Indication:    Answer:  Sepsis  . fentaNYL (SUBLIMAZE) injection 50 mcg    Sig:   . metoCLOPramide (REGLAN) injection 10 mg    Sig:      Sandra Tellefsen Camprubi-Soms, PA-C 06/25/2015 0488  Julianne Rice, MD 06/25/15 [phone]

## 2015-06-24 NOTE — ED Notes (Signed)
I have just phoned report to [place], [place] in ICU.  Total IV intake in E.D.:  1005 ml.

## 2015-06-24 NOTE — ED Notes (Signed)
Patient is from home and transported via [facility]. Pt has a history of liver cancer. Pt had a MRI performed yesterday. Since, he has had vomiting, and diarrhea. Also, pt complains of right upper quad.

## 2015-06-24 NOTE — Progress Notes (Signed)
CRITICAL VALUE ALERT  Critical value received:  Lactic acid 6.6  Date of notification:  06/01/2015  Time of notification: 6834  Critical value read back:Yes.    Nurse who received alert:  Dorrene German, RN  MD notified (1st page):  Mart Piggs  Time of first page:  1317  MD notified (2nd page):  Time of second page:  Responding MD:    Time MD responded:

## 2015-06-24 NOTE — Progress Notes (Signed)
Advance for Vancomycin & Zosyn Indication: sepsis  Allergies  Allergen Reactions  . Haldol [Haloperidol] Swelling and Other (See Comments)    afib problems    Patient Measurements: Height: 5\' 9"  (175.3 cm) Weight: 223 lb (101.152 kg) IBW/kg (Calculated) : 70.7  Vital Signs: Temp: 97.8 F (36.6 C) (08/24 0534) Temp Source: Oral (08/24 0534) BP: 131/62 mmHg (08/24 0534) Pulse Rate: 91 (08/24 0534) Intake/Output from previous day:   Intake/Output from this shift:    Labs:  Recent Labs  06/19/2015 0613  WBC 17.1*  HGB 10.4*  PLT 144*  CREATININE 1.98*   Estimated Creatinine Clearance: 50.6 mL/min (by C-G formula based on Cr of 1.98). No results for input(s): VANCOTROUGH, VANCOPEAK, VANCORANDOM, GENTTROUGH, GENTPEAK, GENTRANDOM, TOBRATROUGH, TOBRAPEAK, TOBRARND, AMIKACINPEAK, AMIKACINTROU, AMIKACIN in the last 72 hours.   Microbiology: No results found for this or any previous visit (from the past 720 hour(s)).  Anti-infectives    Start     Dose/Rate Route Frequency Ordered Stop   06/23/2015 0715  piperacillin-tazobactam (ZOSYN) IVPB 3.375 g     3.375 g 100 mL/hr over 30 Minutes Intravenous  Once 06/11/2015 0703     06/20/2015 0715  vancomycin (VANCOCIN) IVPB 1000 mg/200 mL premix     1,000 mg 200 mL/hr over 60 Minutes Intravenous  Once 06/22/2015 0703        Assessment: 54 yo M with hx hepatocellular carcinoma status post chemo embolization treatment, Hep C, cirrhosis who presents with RUQ abdominal pain x1 day.  He also reports bloody diarrhea, hematesis, and decreased urination.   He is afebrile, but leukocytosis and lactic acidosis noted.  Empiric, broad-spectrum antibiotics are being started to R/O sepsis in immunocompromised patient.   Scr is elevated above patient's baseline (Scr 0.8-0.98).   NCrCl ~ 43 ml/min.   8/24 >> Vanc >> 8/24 >> Zosyn >>    8/24 blood x2: IP 8/24 urine: IP  Dose changes/levels:  Goal of Therapy:   Vancomycin trough level 15-20 mcg/ml  Eradicate infection.  Plan:  Zosyn 3.375gm IV Q8h to be infused over 4hrs Vancomycin 2gm IV x1 now followed by Vancomycin 1250mg  IV q24h Check Vancomycin trough at steady state Monitor renal function and cx data   Biagio Borg 06/02/2015,7:25 AM

## 2015-06-24 NOTE — ED Notes (Signed)
Ultrasound at bedside

## 2015-06-24 NOTE — ED Notes (Signed)
Pt. Has urinal at the bedside. Will collect when pt. Voids. Nurse was notified.

## 2015-06-24 NOTE — H&P (Signed)
[facility] History and Physical  Oscar Swanson EGB:[id number] DOB: [birth year] DOA: 06/18/2015  Referring physician: ED PA, Gurney Maxin PCP: Angelica Chessman, MD   Chief Complaint: abd pain   HPI:  Patient is 54 year old male with known history of liver cirrhosis (Child-Pugh score is 10, class C), secondary to untreated hepatitis C and alcohol abuse, hepatocellular carcinoma follows with Dr. Annamaria Boots, last saw in June 2016 (T1N0M0 stage I HCC, s/p TACE on 10/09/2014, but restaging scan on 03/17/2015 showed the liver mass has increased in size, significant increase of his AFP level, consistent with disease progression), status post TIPS procedure in February 2015 and chemoembolization, last one June 2016 by IR, esophageal varices, schizophrenia, presented to [facility] emergency department with main concern of several days duration of progressively worsening right upper quadrant abdominal pain, throbbing and intermittent initially but currently constant and 10/10 in severity, occasionally but not consistently radiating to epigastric area, worse with eating or drinking fluids, also worse when trying to lay down flat or on his left side, no specific alleviating factors. This has been associated with nausea and poor oral intake, about 10 episodes of emesis with some speckles of blood noted, as well as about 10 episodes of watery diarrhea. Patient denies drinking alcohol. Denies fevers or chills, no specific urinary concerns. Patient also denies chest pain or shortness of breath. Denies recent travels, sick contacts or exposures, no NSAID use.  In emergency department, patient noted to be in mild discomfort due to pain, vital signs notable for RR up to 23, otherwise stable. Blood work notable for WBC 17 K, Hg 10.4, PLT 144, Na 127, K 5.3, Cr 1.98, INR 1.96.  Assessment and Plan:  Principal Problem:   Sepsis - Please note that patient meets sepsis criteria with WBC 17 K, RR up to 23, HR up  to 91, lactic acid greater than 6, source ? intra-abdominal however urinalysis and chest x-ray still pending - Sepsis order set in place - Agree with continuing broad-spectrum anti-biotics vancomycin and Zosyn for now and readjust the regimen as indicated - Provide IV fluids, follow-up and urine and blood cultures, chest x-ray, procalcitonin level - Repeat lactic acid to ensure resolution  Active Problems:   Hepatocellular carcinoma, T1N0M0 stage I HCC - MRI of the abdomen on August 23 notable for persistent right hepatic lobe hepatocellular carcinoma status post chemoembolization - Will notify patient's oncology of patient's admission   AKI (acute kidney injury) - Appears to be prerenal in etiology - IV fluids provided, repeat BMP in the morning   Hyperkalemia with metabolic acidosis - In the setting of acute kidney injury - Repeat BMP now and his potassium still elevated will address with Kayexalate   Hyponatremia - At baseline sodium is around 133 - Now down likely due to prerenal etiology, acute illness and lasix use - We will provide IV fluids and repeat BMP in the morning   Elevated INR - From cirrhosis - Avoid heparin products, SCDs for prophylaxis   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, HCC - No signs of active bleeding, observe   Anemia of chronic disease of malignancy - HCC - no indication for transfusion at this time   Protein-calorie malnutrition, severe - In the setting of chronic illness - Significant muscle wasting noted with hypoalbuminemia on blood work   Schizophrenia - Stable at this time   Transaminitis  - From progressive hepatocellular carcinoma, liver cirrhosis - Monitor   COPD - Stable oxygen on admission, provide  broncho-dilators scheduled and as   Obesity  - Body mass index is 34   DVT prophylaxis - SCD's  Radiological Exams on Admission: Mr Abdomen W Wo Contrast 06/23/2015   Significantly image degraded exam with  no significant change in size of hypoenhancing posterior segment right hepatic lobe hepatocellular carcinoma status post chemoembolization.  The liver is increasingly nodular and shrunken compatible with cirrhosis although treatment effect or diffuse hepatic metastatic disease could appear similar.  There remain several nodules throughout the liver with signal intensity which may suggest dysplastic nodules or hepatocellular carcinoma, and these are predominantly stable with the exception of the new subcapsular anterior segment right hepatic lobe nodule.  Small ascites.     US Abdomen Limited 06/23/2015   Small gallstones and small polyp.  Cirrhosis with hepatic lesions. The largest lesion is compatible with the known hepatocellular carcinoma. There is an additional lesion measuring up to 3.4 cm which could represent hepatocellular carcinoma versus dysplastic nodule based on the previous MRI.  Small amount of perihepatic ascites.  Flow in the TIPS stent.     Code Status: Full Family Communication: Pt at bedside Disposition Plan: Admit for further evaluation    Mart Piggs [facility] [phone]   Review of Systems:  Constitutional:  Negative for diaphoresis.  HENT: Negative for hearing loss, ear pain, nosebleeds, congestion, sore throat, neck pain, tinnitus and ear discharge.   Eyes: Negative for blurred vision, double vision, photophobia, pain, discharge and redness.  Respiratory: Negative for shortness of breath, wheezing and stridor.   Cardiovascular: Negative for chest pain, palpitations, orthopnea, claudication and leg swelling.  Gastrointestinal: Per HPI  Genitourinary: Negative for dysuria, urgency, frequency, and flank pain.  Musculoskeletal: Negative for myalgias, back pain.  Skin: Negative for itching and rash.  Neurological: Negative for dizziness and weakness. Endo/Heme/Allergies: Negative for environmental allergies and polydipsia. Does not bruise/bleed easily.  Psychiatric/Behavioral:  Negative for suicidal ideas. The patient is not nervous/anxious.      Past Medical History  Diagnosis Date  . Diabetes mellitus without complication   . Schizophrenia     onset age 25.  in and out of psych facilities from age 73 to age 40.  s/p electroconvulsive therapy.  . TB (tuberculosis)     sister says this dx'd at age 79 and treated  . Hepatic encephalopathy   . Esophageal varices   . GI bleed   . Cirrhosis   . Thrombocytopenia   . Alcohol abuse   . Mild Coagulopathy 11/08/2013  . Hepatitis C 12/02/2013  . Smoking 09/23/2013  . Cancer     Liver    Past Surgical History  Procedure Laterality Date  . Cataract surgery  Bilateral   . Esophagogastroduodenoscopy N/A 11/08/2013    Procedure: ESOPHAGOGASTRODUODENOSCOPY (EGD);  Surgeon: Milus Banister, MD;  Location: [place];  Service: Endoscopy;  Laterality: N/A;  bedside  . Radiology with anesthesia N/A 12/26/2013    Procedure: Transjugular Intrahepatic Portosystemic Shunt (TIPS);  Surgeon: Carylon Perches, MD;  Location: [place];  Service: Radiology;  Laterality: N/A;    Social History:  reports that he has been smoking Cigarettes.  He has a 12.5 pack-year smoking history. He has never used smokeless tobacco. He reports that he drinks alcohol. He reports that he does not use illicit drugs.  Allergies  Allergen Reactions  . Haldol [Haloperidol] Swelling and Other (See Comments)    afib problems    Family History  Problem Relation Age of Onset  . Hypertension Mother   .  Colon cancer Neg Hx   . Lung cancer Mother     smoker  . Colon polyps Sister     twin sister    Medication Sig  albuterol (PROVENTIL HFA;VENTOLIN HFA) 108 (90 BASE) MCG/ACT inhaler Inhale 2 puffs into the lungs every 6 (six) hours as needed for wheezing or shortness of breath.  albuterol (PROVENTIL) (2.5 MG/3ML) 0.083% nebulizer solution Take 3 mLs (2.5 mg total) by nebulization every 6 (six) hours as needed for wheezing or shortness of breath.  aspirin  325 MG tablet Take 325 mg by mouth daily as needed for moderate pain.   folic acid (FOLVITE) 1 MG tablet Take 1 tablet (1 mg total) by mouth daily.  furosemide (LASIX) 40 MG tablet Take 1 tablet (40 mg total) by mouth 2 (two) times daily.  lactulose (CHRONULAC) 10 GM/15ML solution Take 30 mLs (20 g total) by mouth 2 (two) times daily. Take 20 G (30 ml) at breakfast and take 20 G (30 ml) at lunch.  oxyCODONE (OXY IR/ROXICODONE) 5 MG immediate release tablet Take 1 tablet (5 mg total) by mouth every 3 (three) hours as needed for moderate pain or severe pain.  pantoprazole (PROTONIX) 40 MG tablet Take 1 tablet (40 mg total) by mouth daily.  spironolactone (ALDACTONE) 100 MG tablet Take 1 tablet (100 mg total) by mouth 2 (two) times daily.  thiamine (VITAMIN B-1) 100 MG tablet Take 1 tablet (100 mg total) by mouth daily.  beclomethasone (QVAR) 80 MCG/ACT inhaler Inhale 2 puffs into the lungs 2 (two) times daily.  rifaximin (XIFAXAN) 550 MG TABS tablet Take 1 tablet (550 mg total) by mouth 2 (two) times daily. Patient not taking: Reported on 06/20/2015    Physical Exam: Filed Vitals:   06/08/2015 0534 06/22/2015 0540 06/25/2015 0730 06/28/2015 0809  BP: 131/62  123/80 122/56  Pulse: 91  87 84  Temp: 97.8 F (36.6 C)  97.7 F (36.5 C)   TempSrc: Oral  Oral   Resp: 18  25 23   Height:  5\' 9"  (1.753 m)    Weight:  101.152 kg (223 lb)    SpO2: 100%  98% 99%    Physical Exam  Constitutional: Appears chronically ill, NAD HENT: Normocephalic. External right and left ear normal. Dry MM Eyes: Conjunctivae and EOM are normal. PERRLA, no scleral icterus.  Neck: Normal ROM. Neck supple. No JVD. No tracheal deviation. No thyromegaly.  CVS: RRR, S1/S2 +, no gallops, no carotid bruit.  Pulmonary: Effort and breath sounds normal, no stridor, rhonchi, wheezes, rales.  Abdominal: Soft. BS +,   tenderness in RUQ area, no rebound or guarding.  Musculoskeletal: Normal range of motion.  Lymphadenopathy: No  lymphadenopathy noted, cervical, inguinal. Neuro: Alert. Normal reflexes, muscle tone coordination. No cranial nerve deficit. Skin: Skin is warm and dry. No rash noted. Not diaphoretic. No erythema. No pallor.  Psychiatric: Normal mood and affect.  Labs on Admission:  Basic Metabolic Panel:  Recent Labs Lab 06/14/2015 0613  NA 127*  K 5.3*  CL 100*  CO2 15*  GLUCOSE 99  BUN 28*  CREATININE 1.98*  CALCIUM 9.0   Liver Function Tests:  Recent Labs Lab 06/15/2015 0613  AST 461*  ALT 59  ALKPHOS 164*  BILITOT 5.5*  PROT 6.6  ALBUMIN 2.2*    Recent Labs Lab 06/11/2015 0613  LIPASE 55*   CBC:  Recent Labs Lab 06/02/2015 0613  WBC 17.1*  NEUTROABS 14.0*  HGB 10.4*  HCT 30.4*  MCV 93.3  PLT 144*  EKG: pending   If 7PM-7AM, please contact night-coverage [email or web address] Password [facility] 06/04/2015, 8:26 AM

## 2015-06-24 NOTE — ED Notes (Signed)
Lab delay - pt wants IV and lab work done at the same time.  RN aware.

## 2015-06-24 NOTE — ED Notes (Signed)
Bed: WA09 Expected date:  Expected time:  Means of arrival:  Comments: 54M RUQ pain/liver CA

## 2015-06-24 NOTE — Progress Notes (Signed)
[place] Progress Note Patient Name: Oscar Swanson DOB: 22-Jun-1961 MRN: [id number]   Date of Service  06/08/2015  HPI/Events of Note  RN called re: elevated Lactate in setting of liver disease. BP 113/73 (MAP 83). Suspect elevated lactate is due impaired liver function  eICU Interventions  Monitor.     Intervention Category Major Interventions: Other:  Merton Border 06/12/2015, 6:10 PM

## 2015-06-24 NOTE — Progress Notes (Signed)
CRITICAL VALUE ALERT  Critical value received:  Lactic acid 5.5  Date of notification:  06/29/2015  Time of notification: 8022  Critical value read back:Yes.    Nurse who received alert: Dorrene German, RN  MD notified (1st page):  Mart Piggs  Time of first page:  1141  MD notified (2nd page):  Time of second page:  Responding MD:  Mart Piggs  Time MD responded: [phone]

## 2015-06-24 NOTE — Progress Notes (Signed)
Bedside RN called with concern regarding rising lactic acid (6.6). Elink MD made aware. VSS. Will continue to monitor.

## 2015-06-24 NOTE — ED Notes (Signed)
We assisted with repositioning per his request; also, Mercedes our PA gave additional orders for pain and nausea meds which I gave.  As I write this he thanks [place] and states he already feels "better".  He is drowsy and oriented x 4 with clear speech.

## 2015-06-25 ENCOUNTER — Other Ambulatory Visit: Payer: Self-pay | Admitting: *Deleted

## 2015-06-25 ENCOUNTER — Inpatient Hospital Stay (HOSPITAL_COMMUNITY): Payer: Medicaid Other

## 2015-06-25 DIAGNOSIS — E872 Acidosis: Secondary | ICD-10-CM

## 2015-06-25 DIAGNOSIS — A419 Sepsis, unspecified organism: Principal | ICD-10-CM

## 2015-06-25 DIAGNOSIS — N179 Acute kidney failure, unspecified: Secondary | ICD-10-CM

## 2015-06-25 DIAGNOSIS — E875 Hyperkalemia: Secondary | ICD-10-CM

## 2015-06-25 DIAGNOSIS — C220 Liver cell carcinoma: Secondary | ICD-10-CM

## 2015-06-25 LAB — BASIC METABOLIC PANEL
Anion gap: 14 (ref 5–15)
Anion gap: 14 (ref 5–15)
Anion gap: 14 (ref 5–15)
BUN: 39 mg/dL — AB (ref 6–20)
BUN: 38 mg/dL — ABNORMAL HIGH (ref 6–20)
BUN: 39 mg/dL — ABNORMAL HIGH (ref 6–20)
CHLORIDE: 99 mmol/L — AB (ref 101–111)
CO2: 12 mmol/L — AB (ref 22–32)
CO2: 10 mmol/L — ABNORMAL LOW (ref 22–32)
CO2: 13 mmol/L — ABNORMAL LOW (ref 22–32)
Calcium: 8.6 mg/dL — ABNORMAL LOW (ref 8.9–10.3)
Calcium: 8.3 mg/dL — ABNORMAL LOW (ref 8.9–10.3)
Calcium: 7.6 mg/dL — ABNORMAL LOW (ref 8.9–10.3)
Chloride: 100 mmol/L — ABNORMAL LOW (ref 101–111)
Chloride: 94 mmol/L — ABNORMAL LOW (ref 101–111)
Creatinine, Ser: 2.72 mg/dL — ABNORMAL HIGH (ref 0.61–1.24)
Creatinine, Ser: 2.93 mg/dL — ABNORMAL HIGH (ref 0.61–1.24)
Creatinine, Ser: 3.37 mg/dL — ABNORMAL HIGH (ref 0.61–1.24)
GFR calc Af Amer: 29 mL/min — ABNORMAL LOW (ref >60)
GFR calc Af Amer: 27 mL/min — ABNORMAL LOW (ref >60)
GFR calc Af Amer: 22 mL/min — ABNORMAL LOW (ref >60)
GFR calc non Af Amer: 25 mL/min — ABNORMAL LOW (ref >60)
GFR calc non Af Amer: 23 mL/min — ABNORMAL LOW (ref >60)
GFR calc non Af Amer: 19 mL/min — ABNORMAL LOW (ref >60)
Glucose, Bld: 110 mg/dL — ABNORMAL HIGH (ref 65–99)
Glucose, Bld: 103 mg/dL — ABNORMAL HIGH (ref 65–99)
Glucose, Bld: 107 mg/dL — ABNORMAL HIGH (ref 65–99)
POTASSIUM: 6.5 mmol/L — AB (ref 3.5–5.1)
Potassium: 6.4 mmol/L (ref 3.5–5.1)
Potassium: 6.5 mmol/L (ref 3.5–5.1)
SODIUM: 125 mmol/L — AB (ref 135–145)
Sodium: 124 mmol/L — ABNORMAL LOW (ref 135–145)
Sodium: 121 mmol/L — ABNORMAL LOW (ref 135–145)

## 2015-06-25 LAB — CBC
HEMATOCRIT: 29.9 % — AB (ref 39.0–52.0)
Hemoglobin: 9.8 g/dL — ABNORMAL LOW (ref 13.0–17.0)
MCH: 32 pg (ref 26.0–34.0)
MCHC: 32.8 g/dL (ref 30.0–36.0)
MCV: 97.7 fL (ref 78.0–100.0)
Platelets: 151 10*3/uL (ref 150–400)
RBC: 3.06 MIL/uL — ABNORMAL LOW (ref 4.22–5.81)
RDW: 21.6 % — AB (ref 11.5–15.5)
WBC: 24.5 10*3/uL — AB (ref 4.0–10.5)

## 2015-06-25 LAB — AMMONIA: Ammonia: 92 umol/L — ABNORMAL HIGH (ref 9–35)

## 2015-06-25 LAB — LACTIC ACID, PLASMA: Lactic Acid, Venous: 7.9 mmol/L (ref 0.5–2.0)

## 2015-06-25 LAB — CREATININE, URINE, RANDOM: Creatinine, Urine: 127.78 mg/dL

## 2015-06-25 LAB — SODIUM, URINE, RANDOM

## 2015-06-25 MED ORDER — LORAZEPAM 1 MG PO TABS
1.0000 mg | ORAL_TABLET | Freq: Four times a day (QID) | ORAL | Status: DC | PRN
  Administered 2015-06-26: 1 mg via ORAL
  Filled 2015-06-25: qty 1

## 2015-06-25 MED ORDER — ADULT MULTIVITAMIN W/MINERALS CH
1.0000 | ORAL_TABLET | Freq: Every day | ORAL | Status: DC
  Administered 2015-06-25: 1 via ORAL
  Filled 2015-06-25: qty 1

## 2015-06-25 MED ORDER — SODIUM CHLORIDE 0.9 % IV BOLUS (SEPSIS)
500.0000 mL | Freq: Once | INTRAVENOUS | Status: AC
  Administered 2015-06-25: 500 mL via INTRAVENOUS

## 2015-06-25 MED ORDER — FOLIC ACID 1 MG PO TABS
1.0000 mg | ORAL_TABLET | Freq: Every day | ORAL | Status: DC
  Administered 2015-06-25: 1 mg via ORAL
  Filled 2015-06-25: qty 1

## 2015-06-25 MED ORDER — THIAMINE HCL 100 MG/ML IJ SOLN: 100.0000 mg | Freq: Every day | INTRAMUSCULAR | Status: DC

## 2015-06-25 MED ORDER — SODIUM POLYSTYRENE SULFONATE 15 GM/60ML PO SUSP
45.0000 g | ORAL | Status: AC
  Administered 2015-06-25: 45 g via ORAL
  Filled 2015-06-25: qty 180

## 2015-06-25 MED ORDER — LORAZEPAM 2 MG/ML IJ SOLN: 1.0000 mg | Freq: Four times a day (QID) | INTRAMUSCULAR | Status: DC | PRN

## 2015-06-25 MED ORDER — LORAZEPAM 1 MG PO TABS: 1.0000 mg | ORAL_TABLET | Freq: Four times a day (QID) | ORAL | Status: DC | PRN

## 2015-06-25 MED ORDER — VITAMIN B-1 100 MG PO TABS
100.0000 mg | ORAL_TABLET | Freq: Every day | ORAL | Status: DC
  Administered 2015-06-25: 100 mg via ORAL
  Filled 2015-06-25: qty 1

## 2015-06-25 MED ORDER — MORPHINE SULFATE (CONCENTRATE) 10 MG/0.5ML PO SOLN
10.0000 mg | ORAL | Status: DC | PRN
  Administered 2015-06-25 – 2015-06-26 (×3): 10 mg via ORAL
  Filled 2015-06-25 (×3): qty 0.5

## 2015-06-25 MED ORDER — HYDROMORPHONE HCL 1 MG/ML IJ SOLN
0.5000 mg | INTRAMUSCULAR | Status: DC | PRN
  Administered 2015-06-25: 1 mg via INTRAVENOUS
  Filled 2015-06-25: qty 2

## 2015-06-25 MED ORDER — HYDROMORPHONE HCL 1 MG/ML IJ SOLN: 0.5000 mg | INTRAMUSCULAR | Status: DC | PRN

## 2015-06-25 MED ORDER — SODIUM BICARBONATE 8.4 % IV SOLN
INTRAVENOUS | Status: DC
  Administered 2015-06-25: 09:00:00 via INTRAVENOUS
  Filled 2015-06-25 (×2): qty 150

## 2015-06-25 NOTE — Progress Notes (Signed)
CRITICAL VALUE ALERT  Critical value received:  K+ 6.4  Date of notification:  06/25/15  Time of notification:  0733  Critical value read back: Yes  Nurse who received alert:  Tilda Franco, RN  MD notified (1st page):  [place]  Time of first page:  [phone]  MD notified (2nd page):  Time of second page:  Responding MD:   Time MD responded:

## 2015-06-25 NOTE — Progress Notes (Signed)
PROGRESS NOTE    Oscar Swanson TWK:[id number] DOB: [birth year] DOA: 06/04/2015 PCP: Angelica Chessman, MD  HPI/Brief narrative 54 year old male with known history of liver cirrhosis (Child-Pugh score is 10, class C), secondary to untreated hepatitis C and alcohol abuse, hepatocellular carcinoma follows with Dr. Annamaria Boots, last saw in June 2016 (T1N0M0 stage I HCC, s/p TACE on 10/09/2014, but restaging scan on 03/17/2015 showed the liver mass has increased in size, significant increase of his AFP level, consistent with disease progression), status post TIPS procedure in February 2015 and chemoembolization, last one June 2016 by IR, esophageal varices, schizophrenia, presented to [facility] emergency department with main concern of several days duration of progressively worsening right upper quadrant abdominal pain associated with nausea, vomiting, diarrhea, poor appetite and chills. In emergency department, patient noted to be in mild discomfort due to pain, vital signs notable for RR up to 23, otherwise stable. Blood work notable for WBC 17 K, Hg 10.4, PLT 144, Na 127, K 5.3, Cr 1.98, INR 1.96.   Assessment/Plan:  Principal Problem:  Sepsis secondary to possible acute cholecystitis - patient met sepsis criteria on admission with WBC 17 K, RR up to 23, HR up to 91, lactic acid greater than 6, source ? intra-abdominal- ? Acute cholecystitis. UA and CXR unremarkable.  - Treated per sepsis protocol with aggressive IV fluid hydration and broad-spectrum IV antibiotics (vancomycin and Zosyn). Will consider DC vancomycin after 24 hours if cultures remain negative to date. - Urine culture: Negative to date. Follow blood cultures. - Repeat lactic acid to ensure resolution. Elevated lactate may be from sepsis and advanced liver disease. - Surgical consultation and follow-up appreciated. Discussed with Dr. Barkley Bruns 8/25: Treat medically only for possible acute cholecystitis. - SBP is a possibility but  minimal perihepatic ascitic fluid and may not be enough to tap. Zosyn will cover. - NPO  Active Problems:  Acute renal failure/hyperkalemia/non-anion gap metabolic acidosis/lactic acidosis - Patient had creatinine of 0.8 on 05/22/15. Admitted with creatinine of 1.98. Progressively worsened over the last 24 hours to 2.93 - DD: Sepsis, dehydration,? Hepatorenal syndrome and urinary retention - Patient had 481 mL on bladder scan and Foley catheter placed. - Started IV bicarbonate drip, a dose of Kayexalate, strict intake and output monitoring and follow BMP closely. No EKG changes of hyperkalemia. - Concern regarding further decline and hence consulted nephrology for assistance with management. - will get renal ultrasound, urine sodium and creatinine.  Hepatocellular carcinoma, T1N0M0 stage I HCC - MRI of the abdomen on August 23 notable for persistent right hepatic lobe hepatocellular carcinoma status post chemoembolization - Notified patient's primary oncologist via Epic   Hyponatremia - At baseline sodium is around 133 - Multifactorial secondary to advanced liver disease, acute kidney injury, dehydration and diuretic use. - Change fluids to IV bicarbonate infusion. Follow BMP.   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   Protein-calorie malnutrition, severe - In the setting of chronic illness and HCC   Schizophrenia - Stable at this time   Transaminitis  - From progressive hepatocellular carcinoma, liver cirrhosis - Monitor   COPD - Stable    Obesity  - Body mass index is 34  Alcohol abuse -Patient states that he has not had alcohol to drink in  a few weeks. CIWA protocol   Tobacco abuse  Cirrhosis status post TIPS - Possibly from alcohol abuse.  Acute encephalopathy - As per sister, patient has been having intermittent confusion here in the hospital. May be secondary to sepsis, acute illness, acute kidney injury and hepatic encephalopathy. Management as above. Will check ammonia levels during next blood draw. No focal deficits.    DVT prophylaxis: SCD's. Auto anticoagulated due to liver disease.  Code Status: Full  Family Communication: Discussed with sister Oscar Swanson) on 06/25/15. She and her sister Oscar Swanson are healthcare powers of attorney. Updated care and answered questions.   Disposition Plan: DC home when medically stable. Remains in stepdown unit   Consultants:  Nephrology   General surgery  Procedures:  Foley catheter 8/25 >  Antibiotics:  IV Zosyn 8/24 >  IV vancomycin 8/24 >   Subjective: Mild RUQ abdominal pain. Asking for something to drink. Denies chest pain or dyspnea. Difficulty urinating prior to Foley catheter.  Objective: Filed Vitals:   06/25/15 0800 06/25/15 0904 06/25/15 1025 06/25/15 1230  BP: 101/52 101/52 94/41 98/32   Pulse: 88 79 81 79  Temp: 97.7 F (36.5 C)     TempSrc: Oral     Resp: 17 16 11 15   Height:      Weight:      SpO2: 98% 99% 99% 97%    Intake/Output Summary (Last 24 hours) at 06/25/15 1240 Last data filed at 06/25/15 0700  Gross per 24 hour  Intake 1356.25 ml  Output    100 ml  Net 1256.25 ml   Filed Weights   06/07/2015 0540 06/23/2015 0853  Weight: 101.152 kg (223 lb) 104.5 kg (230 lb 6.1 oz)     Exam:  General exam: Moderately built and nourished pleasant middle-aged male lying comfortably supine in bed. Scleral icterus. Oral mucosa dry.  Respiratory system: Clear. No increased work of breathing. Cardiovascular system: S1 & S2 heard, RRR. No JVD, murmurs, gallops, clicks or pedal edema.Telemetry: Sinus rhythm.  Gastrointestinal system: Abdomen is  nondistended, soft. Mild RUQ tenderness without rigidity, guarding or rebound. Trace anterior abdominal wall pitting edema. Normal bowel sounds heard. Central nervous system: Alert and oriented. No focal neurological deficits. Extremities: Symmetric 5 x 5 power.   Data Reviewed: Basic Metabolic Panel:  Recent Labs Lab 06/09/2015 0613 06/25/2015 1226 06/25/15 0341 06/25/15 0700  NA 127* 126* 125* 124*  K 5.3* 5.7* SLIGHT HEMOLYSIS 6.4*  CL 100* 100* 99* 100*  CO2 15* 13* 12* 10*  GLUCOSE 99 93 110* 103*  BUN 28* 31* 39* 38*  CREATININE 1.98* 2.18* 2.72* 2.93*  CALCIUM 9.0 8.4* 8.6* 8.3*   Liver Function Tests:  Recent Labs Lab 06/15/2015 0613  AST 461*  ALT 59  ALKPHOS 164*  BILITOT 5.5*  PROT 6.6  ALBUMIN 2.2*    Recent Labs Lab 06/29/2015 0613  LIPASE 55*   No results for input(s): AMMONIA in the last 168 hours. CBC:  Recent Labs Lab 06/10/2015 0613 06/25/15 0341  WBC 17.1* 24.5*  NEUTROABS 14.0*  --   HGB 10.4* 9.8*  HCT 30.4* 29.9*  MCV 93.3 97.7  PLT 144* 151   Cardiac Enzymes: No results for input(s): CKTOTAL, CKMB, CKMBINDEX, TROPONINI in the last 168 hours. BNP (last 3 results) No results for input(s): PROBNP in the last 8760 hours. CBG: No results for input(s): GLUCAP in the last 168 hours.  Recent Results (  from the past 240 hour(s))  MRSA PCR Screening     Status: None   Collection Time: 06/13/2015  8:53 AM  Result Value Ref Range Status   MRSA by PCR NEGATIVE NEGATIVE Final    Comment:        The GeneXpert MRSA Assay (FDA approved for NASAL specimens only), is one component of a comprehensive MRSA colonization surveillance program. It is not intended to diagnose MRSA infection nor to guide or monitor treatment for MRSA infections.   Urine culture     Status: None (Preliminary result)   Collection Time: 06/25/2015  8:35 PM  Result Value Ref Range Status   Specimen Description URINE, CLEAN CATCH  Final   Special Requests Immunocompromised   Final   Culture   Final    NO GROWTH < 12 HOURS Performed at [facility]    Report Status PENDING  Incomplete           Studies: Dg Chest 2 View  06/21/2015   CLINICAL DATA:  Sepsis.  EXAM: CHEST  2 VIEW  COMPARISON:  December 26, 2013.  FINDINGS: The heart size and mediastinal contours are within normal limits. Both lungs are clear. No pneumothorax or pleural effusion is noted. The visualized skeletal structures are unremarkable.  IMPRESSION: No active cardiopulmonary disease.   Electronically Signed   By: Marijo Conception, M.D.   On: 06/06/2015 08:48   US Abdomen Limited  06/05/2015   CLINICAL DATA:  Right upper quadrant pain. History of hepatitis-C and hepatocellular carcinoma.  EXAM: US ABDOMEN LIMITED - RIGHT UPPER QUADRANT  COMPARISON:  MRI 06/23/2015  FINDINGS: Gallbladder:  Small echogenic foci in the gallbladder are compatible with stones. Largest measures 0.9 cm. In addition, there appears to be a small heterogeneous gallbladder polyp measuring 0.4 cm. Gallbladder wall is mildly thickened measuring up to 0.4 cm. Gallbladder is not distended. Patient did not have a sonographic Murphy's sign.  Common bile duct:  Diameter: 0.4 cm  Liver:  Liver is nodular and heterogeneous. There is a heterogeneous lesion along the posterior hepatic dome measuring up to 5.9 cm and compatible with known hepatoma. Small amount of perihepatic ascites. There appears to be flow in the TIPS stent. There is an additional liver lesion measuring up to 3.4 cm.  IMPRESSION: Small gallstones and small polyp.  Cirrhosis with hepatic lesions. The largest lesion is compatible with the known hepatocellular carcinoma. There is an additional lesion measuring up to 3.4 cm which could represent hepatocellular carcinoma versus dysplastic nodule based on the previous MRI.  Small amount of perihepatic ascites.  Flow in the TIPS stent.   Electronically Signed   By: Markus Daft M.D.   On: 06/13/2015 07:29         Scheduled Meds: . antiseptic oral rinse  7 mL Mouth Rinse q12n4p  . chlorhexidine  15 mL Mouth Rinse BID  . folic acid  1 mg Oral Daily  . guaiFENesin  600 mg Oral BID  . lactulose  20 g Oral BID WC  . multivitamin with minerals  1 tablet Oral Daily  . piperacillin-tazobactam (ZOSYN)  IV  3.375 g Intravenous Q8H  . thiamine  100 mg Oral Daily   Or  . thiamine  100 mg Intravenous Daily  . vancomycin  1,250 mg Intravenous Q24H   Continuous Infusions: .  sodium bicarbonate  infusion 1000 mL 75 mL/hr at 06/25/15 8921    Principal Problem:   Sepsis Active Problems:   Schizophrenia  Hepatitis C   Hyponatremia   Hepatocellular carcinoma   AKI (acute kidney injury)   Elevated INR   Hyperkalemia   Thrombocytopenia   Anemia of chronic disease   Protein-calorie malnutrition, severe    Time spent: 5 minutes   Ceara Wrightson, MD, FACP, FHM. [facility] Pager [phone]  If 7PM-7AM, please contact night-coverage [email or web address] Password [email or web address] 06/25/2015, 12:40 PM    LOS: 1 day

## 2015-06-25 NOTE — Progress Notes (Signed)
Addendum  Discussed with Dr. Jonnie Finner, Nephrology: He indicates extremely poor prognosis which I agree with. He has discussed with patient's family who have decided DO NOT RESUSCITATE & transition to comfort care. I called and discussed with patient's sister Ms. Kate Sable who confirms her discussion with the nephrologist. She also agrees to stopping all lab draws, stopping all medications nonessential to comfort and hospice referral in a.m. for possible residential hospice. Discussed with patient's RN and updated care.  Vernell Leep, MD, FACP, FHM. [facility] Pager [phone]  If 7PM-7AM, please contact night-coverage [email or web address] Password [facility] 06/25/2015, 5:26 PM

## 2015-06-25 NOTE — Consult Note (Signed)
Renal Service Consult Note [facility]  Oscar Swanson 06/25/2015 Oscar Swanson D Requesting Physician:  Dr Oscar Swanson  Reason for Consult:  Acute renal failure in cirrhotic HPI: The patient is a 54 y.o. 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 Since here creat increased from 2.18 to 2.93.  UOP is less than 200 cc/day.  K up to 6.4.  wBC 24K, plt 151, Hb 9.8.  Urine Na < 10, UCr very high.  Patient is alert and denies SOB, cough or CP. No fevers, abd pain better w pain meds.     Past Medical History  Past Medical History  Diagnosis Date  . Diabetes mellitus without complication   . Schizophrenia     onset age 5.  in and out of psych facilities from age 78 to age 62.  s/p electroconvulsive therapy.  . TB (tuberculosis)     sister says this dx'd at age 40 and treated  . Hepatic encephalopathy   . Esophageal varices   . GI bleed   . Cirrhosis   . Thrombocytopenia   . Alcohol abuse   . Mild Coagulopathy 11/08/2013  . Hepatitis C 12/02/2013  . Smoking 09/23/2013  . Cancer     Liver   Past Surgical History  Past Surgical History  Procedure Laterality Date  . Cataract surgery  Bilateral   . Esophagogastroduodenoscopy N/A 11/08/2013    Procedure: ESOPHAGOGASTRODUODENOSCOPY (EGD);  Surgeon: Oscar Banister, MD;  Location: [facility];  Service: Endoscopy;  Laterality: N/A;  bedside  . Radiology with anesthesia N/A 12/26/2013    Procedure: Transjugular Intrahepatic Portosystemic Shunt (TIPS);  Surgeon: Oscar Perches, MD;  Location: [facility];  Service: Radiology;  Laterality: N/A;   Family History  Family History  Problem Relation Age of Onset  . Hypertension Mother   . Colon cancer Neg Hx   . Lung cancer Mother     smoker  . Colon polyps Sister     twin sister   Social History  reports that he has been smoking Cigarettes.  He has a 12.5 pack-year smoking history.  He has never used smokeless tobacco. He reports that he drinks alcohol. He reports that he does not use illicit drugs. Allergies  Allergies  Allergen Reactions  . Haldol [Haloperidol] Swelling and Other (See Comments)    afib problems   Home medications Prior to Admission medications   Medication Sig Start Date End Date Taking? Authorizing Provider  albuterol (PROVENTIL HFA;VENTOLIN HFA) 108 (90 BASE) MCG/ACT inhaler Inhale 2 puffs into the lungs every 6 (six) hours as needed for wheezing or shortness of breath. 03/19/15  Yes Oscar Garter, MD  albuterol (PROVENTIL) (2.5 MG/3ML) 0.083% nebulizer solution Take 3 mLs (2.5 mg total) by nebulization every 6 (six) hours as needed for wheezing or shortness of breath. 03/19/15  Yes Oscar Garter, MD  aspirin 325 MG tablet Take 325 mg by mouth daily as needed for moderate pain.    Yes Historical Provider, MD  folic acid (FOLVITE) 1 MG tablet Take 1 tablet (1 mg total) by mouth daily. 03/19/15  Yes Oscar Garter, MD  furosemide (LASIX) 40 MG tablet Take 1 tablet (40 mg total) by mouth 2 (two) times daily. 03/19/15  Yes Oscar Garter, MD  lactulose (CHRONULAC) 10 GM/15ML solution Take 30 mLs (20 g total) by mouth 2 (two) times daily. Take 20 G (30 ml)  at breakfast and take 20 G (30 ml) at lunch. 03/19/15  Yes Oscar Garter, MD  oxyCODONE (OXY IR/ROXICODONE) 5 MG immediate release tablet Take 1 tablet (5 mg total) by mouth every 3 (three) hours as needed for moderate pain or severe pain. 06/18/15  Yes Oscar Merle, MD  pantoprazole (PROTONIX) 40 MG tablet Take 1 tablet (40 mg total) by mouth daily. 03/19/15  Yes Oscar Garter, MD  spironolactone (ALDACTONE) 100 MG tablet Take 1 tablet (100 mg total) by mouth 2 (two) times daily. 03/19/15  Yes Oscar Garter, MD  thiamine (VITAMIN B-1) 100 MG tablet Take 1 tablet (100 mg total) by mouth daily. 03/19/15  Yes Oscar Garter, MD  beclomethasone (QVAR) 80 MCG/ACT inhaler Inhale 2  puffs into the lungs 2 (two) times daily. 03/19/15   Oscar Essie Christine, MD  ENSURE (ENSURE) Take 1 Can by mouth 2 (two) times daily between meals. Patient not taking: Reported on 06/02/2015 09/16/14   Oscar Banister, MD  rifaximin (XIFAXAN) 550 MG TABS tablet Take 1 tablet (550 mg total) by mouth 2 (two) times daily. Patient not taking: Reported on 07/01/2015 08/15/14   Oscar Banister, MD   Liver Function Tests  Recent Labs Lab 06/30/2015 0613  AST 461*  ALT 59  ALKPHOS 164*  BILITOT 5.5*  PROT 6.6  ALBUMIN 2.2*    Recent Labs Lab 06/19/2015 0613  LIPASE 55*   CBC  Recent Labs Lab 06/17/2015 0613 06/25/15 0341  WBC 17.1* 24.5*  NEUTROABS 14.0*  --   HGB 10.4* 9.8*  HCT 30.4* 29.9*  MCV 93.3 97.7  PLT 144* 502   Basic Metabolic Panel  Recent Labs Lab 06/28/2015 0613 06/06/2015 1226 06/25/15 0341 06/25/15 0700  NA 127* 126* 125* 124*  K 5.3* 5.7* SLIGHT HEMOLYSIS 6.4*  CL 100* 100* 99* 100*  CO2 15* 13* 12* 10*  GLUCOSE 99 93 110* 103*  BUN 28* 31* 39* 38*  CREATININE 1.98* 2.18* 2.72* 2.93*  CALCIUM 9.0 8.4* 8.6* 8.3*    Filed Vitals:   06/25/15 0904 06/25/15 1025 06/25/15 1230 06/25/15 1245  BP: 101/52 94/41 98/32    Pulse: 79 81 79 80  Temp:    97.8 F (36.6 C)  TempSrc:    Oral  Resp: 16 11 15 16   Height:      Weight:      SpO2: 99% 99% 97% 99%   Exam Pale, emaciated, alert No rash, cyanosis or gangrene Sclera anicteric, throat clear No jvd, JVP 8 cm Chest diffuse exp rhonchi, no wheezing RRR no MRG Abd 2+ ascites, venous expansion abd wall, nontender, dec'd BS GU foley in place 2+ thigh and leg edema bilat Neuro gen weakness, nonfocal, Ox 3  UA negative UNa <10 UCr 128 CXR clear Renal US no hydro, 11 cm kidneys  Assessment/Rec: 1. Acute renal failure - UA bland, [place] normal, BP's soft and urine Na < 10, all consistent with hepatorenal syndrome.  Discussed with family / sister and brother-in-law.  Poor candidate for aggressive ICU Rx (CRRT,  pressors,etc) given severe underlying comorbidities. He most likely will not get better and has a life expectancy < 2 weeks. They have requested no heroic measures and want to transition to comfort care. Have d/w primary MD, stop non-comfort medications. Consider [facility] referral.   2. Hepatocellular carcinoma 3. Cirrhosis 4. Hx etoh abuse 5. Hep Caesar Chestnut MD (pgr) [phone]    ([phone] 06/25/2015, 4:21 PM

## 2015-06-25 NOTE — Progress Notes (Signed)
CRITICAL VALUE ALERT  Critical value received:  Potassium 6.5  Date of notification:  06/25/2015  Time of notification:  0439  Critical value read back: yes  Nurse who received alert:  Erling Cruz, RN  MD notified (1st page):  Schorr  Time of first page:  0520  MD notified (2nd page):  Time of second page:  Responding MD:    Time MD responded:

## 2015-06-25 NOTE — Care Management Note (Signed)
Case Management Note  Patient Details  Name: Oscar Swanson MRN: [id number] Date of Birth: 04-11-1961  Subjective/Objective:                  sepsis  Action/Plan: Date:  June 25, 2015 U.R. performed for needs and level of care. Will continue to follow for Case Management needs.  Oscar Harman, RN, BSN, [place]   [phone]  Expected Discharge Date:   (UNKNOWN)               Expected Discharge Plan:  Home/Self Care  In-House Referral:  NA  Discharge planning Services  CM Consult  Post Acute Care Choice:  NA Choice offered to:  NA  DME Arranged:    DME Agency:     HH Arranged:    [place] Agency:     Status of Service:  Completed, signed off  Medicare Important Message Given:    Date Medicare IM Given:    Medicare IM give by:    Date Additional Medicare IM Given:    Additional Medicare Important Message give by:     If discussed at [place] of Stay Meetings, dates discussed:    Additional Comments:  Oscar Cha, RN 06/25/2015, 3:09 PM

## 2015-06-25 NOTE — Progress Notes (Signed)
Subjective: Still with some RUQ pain  Objective: Vital signs in last 24 hours: Temp:  [96.8 F (36 C)-97.7 F (36.5 C)] 97.3 F (36.3 C) (08/25 0400) Pulse Rate:  [72-105] 81 (08/25 0607) Resp:  [11-27] 12 (08/25 0607) BP: (107-147)/(43-129) 107/48 mmHg (08/25 0600) SpO2:  [89 %-100 %] 99 % (08/25 0607) Weight:  [104.5 kg (230 lb 6.1 oz)] 104.5 kg (230 lb 6.1 oz) (08/24 0853)    Intake/Output from previous day: 08/24 0701 - 08/25 0700 In: 1446.3 [I.V.:1346.3; IV Piggyback:100] Out: 100 [Urine:100] Intake/Output this shift:    PE: General- In NAD Abdomen-soft, mild RUQ tenderness with no Murphy's sign  Lab Results:   Recent Labs  07/01/2015 0613 06/25/15 0341  WBC 17.1* 24.5*  HGB 10.4* 9.8*  HCT 30.4* 29.9*  PLT 144* 151   BMET  Recent Labs  06/25/15 0341 06/25/15 0700  NA 125* 124*  K SLIGHT HEMOLYSIS 6.4*  CL 99* 100*  CO2 12* 10*  GLUCOSE 110* 103*  BUN 39* 38*  CREATININE 2.72* 2.93*  CALCIUM 8.6* 8.3*   PT/INR  Recent Labs  06/29/2015 0613 06/09/2015 0921  LABPROT 22.2* 24.6*  INR 1.96* 2.24*   Comprehensive Metabolic Panel:    Component Value Date/Time   NA 124* 06/25/2015 0700   NA 125* 06/25/2015 0341   NA 134* 05/22/2015 1157   NA 133* 04/10/2015 1518   K 6.4* 06/25/2015 0700   K SLIGHT HEMOLYSIS 06/25/2015 0341   K 3.9 05/22/2015 1157   K 4.7 04/10/2015 1518   CL 100* 06/25/2015 0700   CL 99* 06/25/2015 0341   CO2 10* 06/25/2015 0700   CO2 12* 06/25/2015 0341   CO2 20* 05/22/2015 1157   CO2 22 04/10/2015 1518   BUN 38* 06/25/2015 0700   BUN 39* 06/25/2015 0341   BUN 14.9 05/22/2015 1157   BUN 11.5 04/10/2015 1518   CREATININE 2.93* 06/25/2015 0700   CREATININE 2.72* 06/25/2015 0341   CREATININE 0.8 05/22/2015 1157   CREATININE 0.8 04/10/2015 1518   GLUCOSE 103* 06/25/2015 0700   GLUCOSE 110* 06/25/2015 0341   GLUCOSE 142* 05/22/2015 1157   GLUCOSE 108 04/10/2015 1518   CALCIUM 8.3* 06/25/2015 0700   CALCIUM 8.6*  06/25/2015 0341   CALCIUM 8.5 05/22/2015 1157   CALCIUM 8.8 04/10/2015 1518   AST 461* 06/09/2015 0613   AST 213* 05/22/2015 1157   AST 250* 05/01/2015 0420   AST 124* 04/10/2015 1518   ALT 59 06/20/2015 0613   ALT 68* 05/22/2015 1157   ALT 69* 05/01/2015 0420   ALT 38 04/10/2015 1518   ALKPHOS 164* 06/23/2015 0613   ALKPHOS 184* 05/22/2015 1157   ALKPHOS 128* 05/01/2015 0420   ALKPHOS 147 04/10/2015 1518   BILITOT 5.5* 06/27/2015 0613   BILITOT 2.65* 05/22/2015 1157   BILITOT 1.9* 05/01/2015 0420   BILITOT 1.73* 04/10/2015 1518   PROT 6.6 06/04/2015 0613   PROT 6.8 05/22/2015 1157   PROT 7.1 05/01/2015 0420   PROT 7.0 04/10/2015 1518   ALBUMIN 2.2* 06/23/2015 0613   ALBUMIN 2.3* 05/22/2015 1157   ALBUMIN 2.5* 05/01/2015 0420   ALBUMIN 2.4* 04/10/2015 1518     Studies/Results: Dg Chest 2 View  06/18/2015   CLINICAL DATA:  Sepsis.  EXAM: CHEST  2 VIEW  COMPARISON:  December 26, 2013.  FINDINGS: The heart size and mediastinal contours are within normal limits. Both lungs are clear. No pneumothorax or pleural effusion is noted. The visualized skeletal structures are unremarkable.  IMPRESSION: No  active cardiopulmonary disease.   Electronically Signed   By: Marijo Conception, M.D.   On: 06/19/2015 08:48   Mr Abdomen W Wo Contrast  06/23/2015   CLINICAL DATA:  Hepatocellular carcinoma status post chemo embolization treatment. Post TIPS. History of hepatitis-C. Elevated AFP, assess for response to treatment and disease progression.  EXAM: MRI ABDOMEN WITHOUT AND WITH CONTRAST  TECHNIQUE: Multiplanar multisequence MR imaging of the abdomen was performed both before and after the administration of intravenous contrast.  CONTRAST:  10 cc Eovist IV contrast  COMPARISON:  MRI 03/17/2015  FINDINGS: Lower chest:  Lung bases are clear.  Significant image degradation due to respiratory motion artifact.  Hepatobiliary: Diffusely nodular hepatic contour, increased since previously, with overall  subjective decrease in hepatic volume. No intrahepatic ductal dilatation is identified. Mildly T2 hyperintense 1.6 cm lateral segment left hepatic lobe hemangioma image 23 series 8 is essentially stable.  Within the posterior segment right hepatic lobe is an inhomogeneous oval mass corresponding to the previously diagnosed hepatocellular carcinoma measuring 5.6 x 5.3 cm image 13 series 7, and measuring 5.6 x 5.1 cm image 26 series 504, not significantly changed allowing for differences in technique and slice selection plane. The mass is predominantly hypoenhancing with either adjacent liver capsule artifact or a few foci of peripheral enhancement posteriorly at postcontrast imaging.  Postcontrast imaging is significantly motion degraded. There are a few mildly T1 hyperintense nodules at precontrast imaging, including a representative 1.9 cm nodule within the posterior right hepatic lobe image 46 series 500. This is not significantly changed, although there is a new anterior segment right hepatic lobe T1 hyperintense nodule in a subcapsular location measuring 1.6 cm image 53 series 5.  Gallstones are reidentified within the mildly distended gallbladder. Trace pericholecystic fluid and potentially gallbladder wall thickening are noted.  Pancreas: Grossly normal  Spleen: Grossly normal  Adrenals/Urinary Tract: Adrenal glands are not well visualized but grossly unremarkable. Kidneys appear normal.  Stomach/Bowel: No dilated loop of bowel.  Vascular/Lymphatic: No aortic aneurysm. No lymphadenopathy although assessment is suboptimal.  Other: Small amount of ascites is present.  Musculoskeletal: Normal  IMPRESSION: Significantly image degraded exam with no significant change in size of hypoenhancing posterior segment right hepatic lobe hepatocellular carcinoma status post chemoembolization.  The liver is increasingly nodular and shrunken compatible with cirrhosis although treatment effect or diffuse hepatic metastatic  disease could appear similar.  There remain several nodules throughout the liver with signal intensity which may suggest dysplastic nodules or hepatocellular carcinoma, and these are predominantly stable with the exception of the new subcapsular anterior segment right hepatic lobe nodule.  Small ascites.   Electronically Signed   By: Conchita Paris M.D.   On: 06/23/2015 11:50   US Abdomen Limited  07/01/2015   CLINICAL DATA:  Right upper quadrant pain. History of hepatitis-C and hepatocellular carcinoma.  EXAM: US ABDOMEN LIMITED - RIGHT UPPER QUADRANT  COMPARISON:  MRI 06/23/2015  FINDINGS: Gallbladder:  Small echogenic foci in the gallbladder are compatible with stones. Largest measures 0.9 cm. In addition, there appears to be a small heterogeneous gallbladder polyp measuring 0.4 cm. Gallbladder wall is mildly thickened measuring up to 0.4 cm. Gallbladder is not distended. Patient did not have a sonographic Murphy's sign.  Common bile duct:  Diameter: 0.4 cm  Liver:  Liver is nodular and heterogeneous. There is a heterogeneous lesion along the posterior hepatic dome measuring up to 5.9 cm and compatible with known hepatoma. Small amount of perihepatic ascites. There appears to  be flow in the TIPS stent. There is an additional liver lesion measuring up to 3.4 cm.  IMPRESSION: Small gallstones and small polyp.  Cirrhosis with hepatic lesions. The largest lesion is compatible with the known hepatocellular carcinoma. There is an additional lesion measuring up to 3.4 cm which could represent hepatocellular carcinoma versus dysplastic nodule based on the previous MRI.  Small amount of perihepatic ascites.  Flow in the TIPS stent.   Electronically Signed   By: Markus Daft M.D.   On: 06/17/2015 07:29    Anti-infectives: Anti-infectives    Start     Dose/Rate Route Frequency Ordered Stop   06/25/15 0800  vancomycin (VANCOCIN) 1,250 mg in sodium chloride 0.9 % 250 mL IVPB     1,250 mg 166.7 mL/hr over 90 Minutes  Intravenous Every 24 hours 06/28/2015 0855     06/08/2015 1400  piperacillin-tazobactam (ZOSYN) IVPB 3.375 g     3.375 g 12.5 mL/hr over 240 Minutes Intravenous Every 8 hours 06/18/2015 0855     06/04/2015 0730  vancomycin (VANCOCIN) 2,000 mg in sodium chloride 0.9 % 500 mL IVPB     2,000 mg 250 mL/hr over 120 Minutes Intravenous  Once 06/22/2015 0726 06/01/2015 1007   06/22/2015 0715  piperacillin-tazobactam (ZOSYN) IVPB 3.375 g     3.375 g 100 mL/hr over 30 Minutes Intravenous  Once 06/18/2015 0703 06/13/2015 0819   06/05/2015 0715  vancomycin (VANCOCIN) IVPB 1000 mg/200 mL premix  Status:  Discontinued     1,000 mg 200 mL/hr over 60 Minutes Intravenous  Once 06/18/2015 0703 06/29/2015 0726      Assessment Principal Problem:   Sepsis Active Problems:   Cholelithiasis +/- acute cholecystitis:  Not an operative candidate.   Schizophrenia   Hepatitis C   Hyponatremia   Hepatocellular carcinoma   AKI (acute kidney injury)-worsening   Elevated INR   Hyperkalemia   Thrombocytopenia   Anemia of chronic disease   Protein-calorie malnutrition, severe    LOS: 1 day   Plan: Would treated medically only for the possible acute cholecystitis.  Will see again prn.   Geanette Buonocore Lenna Sciara 06/25/2015

## 2015-06-26 DIAGNOSIS — K810 Acute cholecystitis: Secondary | ICD-10-CM

## 2015-06-26 DIAGNOSIS — K767 Hepatorenal syndrome: Secondary | ICD-10-CM

## 2015-06-26 MED ORDER — MORPHINE SULFATE (CONCENTRATE) 10 MG/0.5ML PO SOLN
10.0000 mg | ORAL | Status: DC | PRN
  Administered 2015-06-26: 10 mg via ORAL
  Filled 2015-06-26: qty 0.5

## 2015-06-27 LAB — URINE CULTURE: Culture: 20000

## 2015-06-29 LAB — CULTURE, BLOOD (ROUTINE X 2)
CULTURE: NO GROWTH
CULTURE: NO GROWTH

## 2015-07-02 NOTE — Progress Notes (Signed)
Patient family is at the bedside. [facility] have been notified (at 1115) and the pt is not a candidate for donation. Will gather any personal belongings that may be in the room and give them to family before they leave. The hospital chaplain is at the bedside with the family and the deceased pt at this time. Will continue to give support to the family during this time.

## 2015-07-02 NOTE — Progress Notes (Addendum)
Pt expired. Time of death 81. No heat beat verified by 2 RN. MD aware. Will now try to notify family.

## 2015-07-02 NOTE — Progress Notes (Signed)
   07/16/15 1100  Clinical Encounter Type  Visited With Patient and family together  Visit Type Initial;Death;Critical Care;Psychological support;Spiritual support  Referral From Nurse;Chaplain  Consult/Referral To Nurse  Recommendations will continue to follow for continued support around grief / loss  Spiritual Encounters  Spiritual Needs Grief support  Stress Factors  Family Stress Factors Loss    Referred by nursing due to pt death, family at bedside.   Chaplain Jerene Pitch and Counseling intern Duffy Rhody provided brief grief support with family at bedside.  Pt's twin sister, nieces, BIL present.  Engaged in narrative review, brief grief support.  Family welcoming of spiritual support, coping appropriately at this time.   Will continue to follow for support as family is present.    Pt's sister reports other family members enroute.    Please page oncall as needs arise.   Jerene Pitch [place]

## 2015-07-02 NOTE — Progress Notes (Signed)
Pt was resting when I arrived and would like a return visit. Will refer to day Chaplain. He was appreciative of visit. Ernest Haber Chaplain   06/25/15 2125  Clinical Encounter Type  Visited With Patient

## 2015-07-02 NOTE — Discharge Summary (Addendum)
Death Summary  DRAYK HUMBARGER HAL:[id number] DOB: [birth year] DOA: July 23, 2015  PCP: Angelica Chessman, MD PCP/Office notified: Forwarded Death Summary to PCP and his primary Oncologist. Informed Dr. Burr Medico.  Admit date: 2015/07/23 Date of Death: 25-Jul-2015  Final Diagnoses:  Principal Problem:   Sepsis Active Problems:   Schizophrenia   Hepatitis C   Hyponatremia   Hepatocellular carcinoma   AKI (acute kidney injury)   Elevated INR   Hyperkalemia   Thrombocytopenia   Anemia of chronic disease   Protein-calorie malnutrition, severe      History of present illness & Hospital course 54 year old male with known history of liver cirrhosis (Child-Pugh score is 10, class C), secondary to untreated hepatitis C and alcohol abuse, hepatocellular carcinoma follows with Dr. Annamaria Boots, last saw in June 2016 (T1N0M0 stage I HCC, s/p TACE on 10/09/2014, but restaging scan on 03/17/2015 showed the liver mass has increased in size, significant increase of his AFP level, consistent with disease progression), status post TIPS procedure in February 2015 and chemoembolization, last one June 2016 by IR, esophageal varices, schizophrenia, presented to [facility] emergency department with main concern of several days duration of progressively worsening right upper quadrant abdominal pain associated with nausea, vomiting, diarrhea, poor appetite and chills. In emergency department, patient noted to be in mild discomfort due to pain, vital signs notable for RR up to 23, otherwise stable. Blood work notable for WBC 17 K, Hg 10.4, PLT 144, Na 127, K 5.3, Cr 1.98, INR 1.96.  He was treated for sepsis possibly secondary to acute cholecystitis. He was treated with IV fluid hydration and broad-spectrum IV antibiotics-vancomycin and Zosyn. Patient had persistently elevated lactate which may be due to sepsis but also due to advanced liver disease. General surgery was consulted and determined that he was not a surgical  candidate or candidate for a percutaneous drain secondary to coagulopathy and multiple other severe medical problems. They recommended medical management.  He had normal creatinine of 0.8 on 05/22/15. He presented with acute renal failure, hyperkalemia, non-anion gap metabolic acidosis and lactic acidosis. This was treated with IV fluid hydration, Foley catheter for urinary retention, IV bicarbonate drip, a Kayexalate. Renal ultrasound did not show hydronephrosis. He had hyponatremia which was probably multifactorial related to advanced liver disease, acute kidney injury, dehydration and diuretic use. Nephrology was consulted and determined that he was a poor candidate for aggressive treatment i.e. CRRT or pressors given severe underlying comorbidities. His overall short and long-term prognosis was determined to be extremely poor. He was also intermittently confused which may be related to his renal failure or hepatic encephalopathy. This M.D. and the nephrologist discussed extensively with patient and family who decided to make him DO NOT RESUSCITATE and transitioned him to full comfort care on 06/25/15. All measures not related to comfort were discontinued. He was treated with when necessary Roxanol and Ativan for pain and anxiety/agitation respectively. This morning patient was unresponsive and had agonal breathing. He appeared comfortable and peaceful. He subsequently demised on Jul 25, 2015 at 10:45 AM.  Other medical problems that were observed or addressed during hospitalization included coagulopathy, thrombocytopenia, anemia, severe protein calorie malnutrition, COPD and acute encephalopathy.    Time: Less than 30 minutes  Signed:  Laury Huizar  [facility] 07-25-15, 11:44 AM

## 2015-07-02 NOTE — Progress Notes (Signed)
Addendum  Patient's niece Ms. Dossie Der requested a letter to her workplace stating that she had to leave early to come visit her uncle in the hospital. After permission from her mother Ms. Kate Sable (patient's healthcare power of attorney) a letter has been provided.  Vernell Leep, MD, FACP, FHM. [facility] Pager [phone]  If 7PM-7AM, please contact night-coverage [email or web address] Password [email or web address] 07/11/15, 1:31 PM

## 2015-07-02 DEATH — deceased

## 2015-07-15 ENCOUNTER — Other Ambulatory Visit: Payer: Medicaid Other

## 2015-07-21 ENCOUNTER — Other Ambulatory Visit: Payer: Medicaid Other

## 2015-07-22 ENCOUNTER — Ambulatory Visit: Payer: Medicaid Other | Admitting: Hematology

## 2015-08-04 NOTE — Progress Notes (Signed)
Error

## 2015-10-21 NOTE — Telephone Encounter (Signed)
error
